# Patient Record
Sex: Female | Born: 1935 | Race: White | Hispanic: No | Marital: Married | Smoking: Never smoker
Health system: Southern US, Community
[De-identification: ages and names within clinical notes are randomized; demographics above are authoritative.]

## PROBLEM LIST (undated history)

## (undated) DIAGNOSIS — I499 Cardiac arrhythmia, unspecified: Secondary | ICD-10-CM

## (undated) DIAGNOSIS — K219 Gastro-esophageal reflux disease without esophagitis: Secondary | ICD-10-CM

## (undated) DIAGNOSIS — Z800 Family history of malignant neoplasm of digestive organs: Secondary | ICD-10-CM

## (undated) DIAGNOSIS — E785 Hyperlipidemia, unspecified: Secondary | ICD-10-CM

## (undated) DIAGNOSIS — Z808 Family history of malignant neoplasm of other organs or systems: Secondary | ICD-10-CM

## (undated) DIAGNOSIS — I10 Essential (primary) hypertension: Secondary | ICD-10-CM

## (undated) DIAGNOSIS — M1990 Unspecified osteoarthritis, unspecified site: Secondary | ICD-10-CM

## (undated) DIAGNOSIS — E559 Vitamin D deficiency, unspecified: Secondary | ICD-10-CM

## (undated) DIAGNOSIS — Z8049 Family history of malignant neoplasm of other genital organs: Secondary | ICD-10-CM

## (undated) HISTORY — DX: Family history of malignant neoplasm of digestive organs: Z80.0

## (undated) HISTORY — DX: Family history of malignant neoplasm of other organs or systems: Z80.8

## (undated) HISTORY — DX: Essential (primary) hypertension: I10

## (undated) HISTORY — DX: Family history of malignant neoplasm of other genital organs: Z80.49

## (undated) HISTORY — DX: Hyperlipidemia, unspecified: E78.5

## (undated) HISTORY — PX: CATARACT EXTRACTION: SUR2

## (undated) HISTORY — DX: Vitamin D deficiency, unspecified: E55.9

---

## 1998-07-03 ENCOUNTER — Ambulatory Visit: Admission: RE | Admit: 1998-07-03 | Discharge: 1998-07-03 | Payer: Self-pay | Admitting: *Deleted

## 1998-07-24 ENCOUNTER — Ambulatory Visit (HOSPITAL_COMMUNITY): Admission: RE | Admit: 1998-07-24 | Discharge: 1998-07-24 | Payer: Self-pay | Admitting: *Deleted

## 2001-03-02 ENCOUNTER — Other Ambulatory Visit: Admission: RE | Admit: 2001-03-02 | Discharge: 2001-03-02 | Payer: Self-pay | Admitting: Obstetrics and Gynecology

## 2002-03-12 ENCOUNTER — Other Ambulatory Visit: Admission: RE | Admit: 2002-03-12 | Discharge: 2002-03-12 | Payer: Self-pay | Admitting: Gynecology

## 2002-03-17 ENCOUNTER — Encounter: Admission: RE | Admit: 2002-03-17 | Discharge: 2002-03-17 | Payer: Self-pay | Admitting: Gynecology

## 2002-03-17 ENCOUNTER — Encounter: Payer: Self-pay | Admitting: Gynecology

## 2002-08-16 ENCOUNTER — Encounter: Payer: Self-pay | Admitting: Gynecology

## 2002-08-16 ENCOUNTER — Encounter: Admission: RE | Admit: 2002-08-16 | Discharge: 2002-08-16 | Payer: Self-pay | Admitting: Gynecology

## 2003-04-06 ENCOUNTER — Other Ambulatory Visit: Admission: RE | Admit: 2003-04-06 | Discharge: 2003-04-06 | Payer: Self-pay | Admitting: Gynecology

## 2003-08-18 ENCOUNTER — Encounter: Admission: RE | Admit: 2003-08-18 | Discharge: 2003-08-18 | Payer: Self-pay | Admitting: Gynecology

## 2003-08-18 ENCOUNTER — Encounter: Payer: Self-pay | Admitting: Gynecology

## 2004-07-04 ENCOUNTER — Other Ambulatory Visit: Admission: RE | Admit: 2004-07-04 | Discharge: 2004-07-04 | Payer: Self-pay | Admitting: Gynecology

## 2004-08-08 ENCOUNTER — Other Ambulatory Visit: Admission: RE | Admit: 2004-08-08 | Discharge: 2004-08-08 | Payer: Self-pay | Admitting: *Deleted

## 2004-08-22 ENCOUNTER — Encounter: Admission: RE | Admit: 2004-08-22 | Discharge: 2004-08-22 | Payer: Self-pay | Admitting: Gynecology

## 2005-08-30 ENCOUNTER — Encounter: Admission: RE | Admit: 2005-08-30 | Discharge: 2005-08-30 | Payer: Self-pay | Admitting: Gynecology

## 2006-07-08 ENCOUNTER — Other Ambulatory Visit: Admission: RE | Admit: 2006-07-08 | Discharge: 2006-07-08 | Payer: Self-pay | Admitting: Gynecology

## 2006-09-02 ENCOUNTER — Encounter: Admission: RE | Admit: 2006-09-02 | Discharge: 2006-09-02 | Payer: Self-pay | Admitting: Gynecology

## 2007-09-04 ENCOUNTER — Encounter: Admission: RE | Admit: 2007-09-04 | Discharge: 2007-09-04 | Payer: Self-pay | Admitting: Gynecology

## 2007-09-29 ENCOUNTER — Encounter: Admission: RE | Admit: 2007-09-29 | Discharge: 2007-09-29 | Payer: Self-pay | Admitting: Gastroenterology

## 2008-09-20 ENCOUNTER — Encounter: Admission: RE | Admit: 2008-09-20 | Discharge: 2008-09-20 | Payer: Self-pay | Admitting: Gynecology

## 2009-09-21 ENCOUNTER — Encounter: Admission: RE | Admit: 2009-09-21 | Discharge: 2009-09-21 | Payer: Self-pay | Admitting: Gynecology

## 2010-09-24 ENCOUNTER — Encounter: Admission: RE | Admit: 2010-09-24 | Discharge: 2010-09-24 | Payer: Self-pay | Admitting: Gynecology

## 2011-08-26 ENCOUNTER — Other Ambulatory Visit: Payer: Self-pay | Admitting: Gynecology

## 2011-08-26 DIAGNOSIS — Z1231 Encounter for screening mammogram for malignant neoplasm of breast: Secondary | ICD-10-CM

## 2011-10-02 ENCOUNTER — Ambulatory Visit
Admission: RE | Admit: 2011-10-02 | Discharge: 2011-10-02 | Disposition: A | Discharge status: Home / Self Care | Payer: Medicare Other | Source: Ambulatory Visit | Attending: Gynecology | Admitting: Gynecology

## 2011-10-02 DIAGNOSIS — Z1231 Encounter for screening mammogram for malignant neoplasm of breast: Secondary | ICD-10-CM

## 2012-02-27 DIAGNOSIS — C44519 Basal cell carcinoma of skin of other part of trunk: Secondary | ICD-10-CM | POA: Diagnosis not present

## 2012-02-27 DIAGNOSIS — L570 Actinic keratosis: Secondary | ICD-10-CM | POA: Diagnosis not present

## 2012-06-17 DIAGNOSIS — E559 Vitamin D deficiency, unspecified: Secondary | ICD-10-CM | POA: Diagnosis not present

## 2012-06-17 DIAGNOSIS — Z5189 Encounter for other specified aftercare: Secondary | ICD-10-CM | POA: Diagnosis not present

## 2012-06-17 DIAGNOSIS — E785 Hyperlipidemia, unspecified: Secondary | ICD-10-CM | POA: Diagnosis not present

## 2012-06-17 DIAGNOSIS — I10 Essential (primary) hypertension: Secondary | ICD-10-CM | POA: Diagnosis not present

## 2012-07-27 DIAGNOSIS — M949 Disorder of cartilage, unspecified: Secondary | ICD-10-CM | POA: Diagnosis not present

## 2012-07-27 DIAGNOSIS — M899 Disorder of bone, unspecified: Secondary | ICD-10-CM | POA: Diagnosis not present

## 2012-07-27 DIAGNOSIS — Z01419 Encounter for gynecological examination (general) (routine) without abnormal findings: Secondary | ICD-10-CM | POA: Diagnosis not present

## 2012-07-27 DIAGNOSIS — N649 Disorder of breast, unspecified: Secondary | ICD-10-CM | POA: Diagnosis not present

## 2012-07-27 DIAGNOSIS — N8184 Pelvic muscle wasting: Secondary | ICD-10-CM | POA: Diagnosis not present

## 2012-09-01 ENCOUNTER — Other Ambulatory Visit: Payer: Self-pay | Admitting: Gynecology

## 2012-09-01 DIAGNOSIS — Z1231 Encounter for screening mammogram for malignant neoplasm of breast: Secondary | ICD-10-CM

## 2012-09-21 DIAGNOSIS — Z23 Encounter for immunization: Secondary | ICD-10-CM | POA: Diagnosis not present

## 2012-10-06 ENCOUNTER — Ambulatory Visit
Admission: RE | Admit: 2012-10-06 | Discharge: 2012-10-06 | Disposition: A | Discharge status: Home / Self Care | Payer: Medicare Other | Source: Ambulatory Visit | Attending: Gynecology | Admitting: Gynecology

## 2012-10-06 DIAGNOSIS — Z1231 Encounter for screening mammogram for malignant neoplasm of breast: Secondary | ICD-10-CM | POA: Diagnosis not present

## 2012-10-07 ENCOUNTER — Other Ambulatory Visit: Payer: Self-pay | Admitting: Gynecology

## 2012-10-07 DIAGNOSIS — R928 Other abnormal and inconclusive findings on diagnostic imaging of breast: Secondary | ICD-10-CM

## 2012-10-08 ENCOUNTER — Ambulatory Visit
Admission: RE | Admit: 2012-10-08 | Discharge: 2012-10-08 | Disposition: A | Discharge status: Home / Self Care | Payer: Medicare Other | Source: Ambulatory Visit | Attending: Gynecology | Admitting: Gynecology

## 2012-10-08 DIAGNOSIS — R928 Other abnormal and inconclusive findings on diagnostic imaging of breast: Secondary | ICD-10-CM

## 2012-10-15 DIAGNOSIS — K5732 Diverticulitis of large intestine without perforation or abscess without bleeding: Secondary | ICD-10-CM | POA: Diagnosis not present

## 2012-10-22 DIAGNOSIS — D239 Other benign neoplasm of skin, unspecified: Secondary | ICD-10-CM | POA: Diagnosis not present

## 2012-10-22 DIAGNOSIS — L820 Inflamed seborrheic keratosis: Secondary | ICD-10-CM | POA: Diagnosis not present

## 2012-10-22 DIAGNOSIS — Z85828 Personal history of other malignant neoplasm of skin: Secondary | ICD-10-CM | POA: Diagnosis not present

## 2012-10-22 DIAGNOSIS — L821 Other seborrheic keratosis: Secondary | ICD-10-CM | POA: Diagnosis not present

## 2012-11-16 DIAGNOSIS — Z79899 Other long term (current) drug therapy: Secondary | ICD-10-CM | POA: Diagnosis not present

## 2012-11-16 DIAGNOSIS — K5900 Constipation, unspecified: Secondary | ICD-10-CM | POA: Diagnosis not present

## 2012-11-16 DIAGNOSIS — E785 Hyperlipidemia, unspecified: Secondary | ICD-10-CM | POA: Diagnosis not present

## 2013-02-17 DIAGNOSIS — J40 Bronchitis, not specified as acute or chronic: Secondary | ICD-10-CM | POA: Diagnosis not present

## 2013-02-22 DIAGNOSIS — E559 Vitamin D deficiency, unspecified: Secondary | ICD-10-CM | POA: Diagnosis not present

## 2013-02-22 DIAGNOSIS — E785 Hyperlipidemia, unspecified: Secondary | ICD-10-CM | POA: Diagnosis not present

## 2013-02-22 DIAGNOSIS — K5900 Constipation, unspecified: Secondary | ICD-10-CM | POA: Diagnosis not present

## 2013-02-22 DIAGNOSIS — Z79899 Other long term (current) drug therapy: Secondary | ICD-10-CM | POA: Diagnosis not present

## 2013-09-03 DIAGNOSIS — Z23 Encounter for immunization: Secondary | ICD-10-CM | POA: Diagnosis not present

## 2013-09-03 DIAGNOSIS — Z79899 Other long term (current) drug therapy: Secondary | ICD-10-CM | POA: Diagnosis not present

## 2013-09-03 DIAGNOSIS — I10 Essential (primary) hypertension: Secondary | ICD-10-CM | POA: Diagnosis not present

## 2013-09-03 DIAGNOSIS — E785 Hyperlipidemia, unspecified: Secondary | ICD-10-CM | POA: Diagnosis not present

## 2013-09-03 DIAGNOSIS — E559 Vitamin D deficiency, unspecified: Secondary | ICD-10-CM | POA: Diagnosis not present

## 2013-09-08 ENCOUNTER — Other Ambulatory Visit: Payer: Self-pay

## 2013-09-08 DIAGNOSIS — Z1231 Encounter for screening mammogram for malignant neoplasm of breast: Secondary | ICD-10-CM

## 2013-09-10 DIAGNOSIS — R319 Hematuria, unspecified: Secondary | ICD-10-CM | POA: Diagnosis not present

## 2013-09-30 DIAGNOSIS — R3129 Other microscopic hematuria: Secondary | ICD-10-CM | POA: Diagnosis not present

## 2013-10-05 DIAGNOSIS — R3129 Other microscopic hematuria: Secondary | ICD-10-CM | POA: Diagnosis not present

## 2013-10-11 ENCOUNTER — Ambulatory Visit
Admission: RE | Admit: 2013-10-11 | Discharge: 2013-10-11 | Disposition: A | Discharge status: Home / Self Care | Payer: Medicare Other | Source: Ambulatory Visit

## 2013-10-11 DIAGNOSIS — Z1231 Encounter for screening mammogram for malignant neoplasm of breast: Secondary | ICD-10-CM

## 2014-03-03 DIAGNOSIS — Z79899 Other long term (current) drug therapy: Secondary | ICD-10-CM | POA: Diagnosis not present

## 2014-03-03 DIAGNOSIS — I10 Essential (primary) hypertension: Secondary | ICD-10-CM | POA: Diagnosis not present

## 2014-03-03 DIAGNOSIS — Z23 Encounter for immunization: Secondary | ICD-10-CM | POA: Diagnosis not present

## 2014-03-03 DIAGNOSIS — E785 Hyperlipidemia, unspecified: Secondary | ICD-10-CM | POA: Diagnosis not present

## 2014-08-25 DIAGNOSIS — Z01419 Encounter for gynecological examination (general) (routine) without abnormal findings: Secondary | ICD-10-CM | POA: Diagnosis not present

## 2014-09-07 DIAGNOSIS — E785 Hyperlipidemia, unspecified: Secondary | ICD-10-CM | POA: Diagnosis not present

## 2014-09-07 DIAGNOSIS — I10 Essential (primary) hypertension: Secondary | ICD-10-CM | POA: Diagnosis not present

## 2014-09-07 DIAGNOSIS — Z1331 Encounter for screening for depression: Secondary | ICD-10-CM | POA: Diagnosis not present

## 2014-09-07 DIAGNOSIS — Z23 Encounter for immunization: Secondary | ICD-10-CM | POA: Diagnosis not present

## 2014-09-07 DIAGNOSIS — Z79899 Other long term (current) drug therapy: Secondary | ICD-10-CM | POA: Diagnosis not present

## 2014-09-07 DIAGNOSIS — E559 Vitamin D deficiency, unspecified: Secondary | ICD-10-CM | POA: Diagnosis not present

## 2014-09-08 ENCOUNTER — Other Ambulatory Visit: Payer: Self-pay

## 2014-09-08 DIAGNOSIS — Z1231 Encounter for screening mammogram for malignant neoplasm of breast: Secondary | ICD-10-CM

## 2014-09-15 DIAGNOSIS — L821 Other seborrheic keratosis: Secondary | ICD-10-CM | POA: Diagnosis not present

## 2014-09-15 DIAGNOSIS — Z85828 Personal history of other malignant neoplasm of skin: Secondary | ICD-10-CM | POA: Diagnosis not present

## 2014-09-15 DIAGNOSIS — D239 Other benign neoplasm of skin, unspecified: Secondary | ICD-10-CM | POA: Diagnosis not present

## 2014-09-15 DIAGNOSIS — I781 Nevus, non-neoplastic: Secondary | ICD-10-CM | POA: Diagnosis not present

## 2014-09-15 DIAGNOSIS — L819 Disorder of pigmentation, unspecified: Secondary | ICD-10-CM | POA: Diagnosis not present

## 2014-09-15 DIAGNOSIS — L570 Actinic keratosis: Secondary | ICD-10-CM | POA: Diagnosis not present

## 2014-10-12 ENCOUNTER — Ambulatory Visit
Admission: RE | Admit: 2014-10-12 | Discharge: 2014-10-12 | Disposition: A | Discharge status: Home / Self Care | Payer: Medicare Other | Source: Ambulatory Visit

## 2014-10-12 DIAGNOSIS — Z1231 Encounter for screening mammogram for malignant neoplasm of breast: Secondary | ICD-10-CM

## 2014-10-20 DIAGNOSIS — H3531 Nonexudative age-related macular degeneration: Secondary | ICD-10-CM | POA: Diagnosis not present

## 2014-10-20 DIAGNOSIS — H04123 Dry eye syndrome of bilateral lacrimal glands: Secondary | ICD-10-CM | POA: Diagnosis not present

## 2014-10-20 DIAGNOSIS — Z961 Presence of intraocular lens: Secondary | ICD-10-CM | POA: Diagnosis not present

## 2015-03-23 DIAGNOSIS — E785 Hyperlipidemia, unspecified: Secondary | ICD-10-CM | POA: Diagnosis not present

## 2015-03-23 DIAGNOSIS — E559 Vitamin D deficiency, unspecified: Secondary | ICD-10-CM | POA: Diagnosis not present

## 2015-03-23 DIAGNOSIS — Z79899 Other long term (current) drug therapy: Secondary | ICD-10-CM | POA: Diagnosis not present

## 2015-03-23 DIAGNOSIS — I10 Essential (primary) hypertension: Secondary | ICD-10-CM | POA: Diagnosis not present

## 2015-09-15 ENCOUNTER — Other Ambulatory Visit: Payer: Self-pay

## 2015-09-15 DIAGNOSIS — Z1231 Encounter for screening mammogram for malignant neoplasm of breast: Secondary | ICD-10-CM

## 2015-09-25 DIAGNOSIS — I499 Cardiac arrhythmia, unspecified: Secondary | ICD-10-CM | POA: Diagnosis not present

## 2015-09-25 DIAGNOSIS — I10 Essential (primary) hypertension: Secondary | ICD-10-CM | POA: Diagnosis not present

## 2015-09-25 DIAGNOSIS — E785 Hyperlipidemia, unspecified: Secondary | ICD-10-CM | POA: Diagnosis not present

## 2015-09-25 DIAGNOSIS — Z79899 Other long term (current) drug therapy: Secondary | ICD-10-CM | POA: Diagnosis not present

## 2015-09-25 DIAGNOSIS — Z1389 Encounter for screening for other disorder: Secondary | ICD-10-CM | POA: Diagnosis not present

## 2015-09-25 DIAGNOSIS — I491 Atrial premature depolarization: Secondary | ICD-10-CM | POA: Diagnosis not present

## 2015-09-25 DIAGNOSIS — Z23 Encounter for immunization: Secondary | ICD-10-CM | POA: Diagnosis not present

## 2015-09-25 DIAGNOSIS — E559 Vitamin D deficiency, unspecified: Secondary | ICD-10-CM | POA: Diagnosis not present

## 2015-10-17 DIAGNOSIS — D225 Melanocytic nevi of trunk: Secondary | ICD-10-CM | POA: Diagnosis not present

## 2015-10-17 DIAGNOSIS — Z86018 Personal history of other benign neoplasm: Secondary | ICD-10-CM | POA: Diagnosis not present

## 2015-10-17 DIAGNOSIS — Z85828 Personal history of other malignant neoplasm of skin: Secondary | ICD-10-CM | POA: Diagnosis not present

## 2015-10-17 DIAGNOSIS — L820 Inflamed seborrheic keratosis: Secondary | ICD-10-CM | POA: Diagnosis not present

## 2015-10-23 ENCOUNTER — Ambulatory Visit
Admission: RE | Admit: 2015-10-23 | Discharge: 2015-10-23 | Disposition: A | Discharge status: Home / Self Care | Payer: Medicare Other | Source: Ambulatory Visit

## 2015-10-23 DIAGNOSIS — Z1231 Encounter for screening mammogram for malignant neoplasm of breast: Secondary | ICD-10-CM | POA: Diagnosis not present

## 2016-03-25 DIAGNOSIS — E785 Hyperlipidemia, unspecified: Secondary | ICD-10-CM | POA: Diagnosis not present

## 2016-03-25 DIAGNOSIS — I10 Essential (primary) hypertension: Secondary | ICD-10-CM | POA: Diagnosis not present

## 2016-03-25 DIAGNOSIS — E559 Vitamin D deficiency, unspecified: Secondary | ICD-10-CM | POA: Diagnosis not present

## 2016-03-25 DIAGNOSIS — I499 Cardiac arrhythmia, unspecified: Secondary | ICD-10-CM | POA: Diagnosis not present

## 2016-03-25 DIAGNOSIS — Z79899 Other long term (current) drug therapy: Secondary | ICD-10-CM | POA: Diagnosis not present

## 2016-09-26 DIAGNOSIS — E559 Vitamin D deficiency, unspecified: Secondary | ICD-10-CM | POA: Diagnosis not present

## 2016-09-26 DIAGNOSIS — Z79899 Other long term (current) drug therapy: Secondary | ICD-10-CM | POA: Diagnosis not present

## 2016-09-26 DIAGNOSIS — Z1389 Encounter for screening for other disorder: Secondary | ICD-10-CM | POA: Diagnosis not present

## 2016-09-26 DIAGNOSIS — I491 Atrial premature depolarization: Secondary | ICD-10-CM | POA: Diagnosis not present

## 2016-09-26 DIAGNOSIS — I10 Essential (primary) hypertension: Secondary | ICD-10-CM | POA: Diagnosis not present

## 2016-09-26 DIAGNOSIS — I499 Cardiac arrhythmia, unspecified: Secondary | ICD-10-CM | POA: Diagnosis not present

## 2016-09-26 DIAGNOSIS — R001 Bradycardia, unspecified: Secondary | ICD-10-CM | POA: Diagnosis not present

## 2016-09-26 DIAGNOSIS — Z23 Encounter for immunization: Secondary | ICD-10-CM | POA: Diagnosis not present

## 2016-09-26 DIAGNOSIS — E785 Hyperlipidemia, unspecified: Secondary | ICD-10-CM | POA: Diagnosis not present

## 2016-09-27 ENCOUNTER — Telehealth: Payer: Self-pay | Admitting: Cardiology

## 2016-09-27 NOTE — Telephone Encounter (Signed)
Follow up  Wendy [name] from [facility] from [facility] office wants [facility] to view pts EKG and to follow up with [facility]  Please f/u

## 2016-09-27 NOTE — Telephone Encounter (Signed)
Spoke to nurse who is going to send the EKG in question to our main clinical fax #.

## 2016-09-27 NOTE — Telephone Encounter (Signed)
New Message  Pt voiced wanting [facility] to see if he can view her EKG faxed from [facility].  Pt voiced not a patient but husband is.  Please f/u with pt

## 2016-09-30 NOTE — Telephone Encounter (Signed)
Left msg for nurse at [place]  to return call.

## 2016-10-07 ENCOUNTER — Other Ambulatory Visit: Payer: Self-pay | Admitting: Family Medicine

## 2016-10-07 DIAGNOSIS — Z1231 Encounter for screening mammogram for malignant neoplasm of breast: Secondary | ICD-10-CM

## 2016-10-24 DIAGNOSIS — D225 Melanocytic nevi of trunk: Secondary | ICD-10-CM | POA: Diagnosis not present

## 2016-10-24 DIAGNOSIS — D2271 Melanocytic nevi of right lower limb, including hip: Secondary | ICD-10-CM | POA: Diagnosis not present

## 2016-10-24 DIAGNOSIS — L821 Other seborrheic keratosis: Secondary | ICD-10-CM | POA: Diagnosis not present

## 2016-10-24 DIAGNOSIS — Z85828 Personal history of other malignant neoplasm of skin: Secondary | ICD-10-CM | POA: Diagnosis not present

## 2016-10-24 DIAGNOSIS — Z23 Encounter for immunization: Secondary | ICD-10-CM | POA: Diagnosis not present

## 2016-10-24 DIAGNOSIS — Z86018 Personal history of other benign neoplasm: Secondary | ICD-10-CM | POA: Diagnosis not present

## 2016-10-31 ENCOUNTER — Ambulatory Visit
Admission: RE | Admit: 2016-10-31 | Discharge: 2016-10-31 | Disposition: A | Discharge status: Home / Self Care | Payer: Medicare Other | Source: Ambulatory Visit | Attending: Family Medicine | Admitting: Family Medicine

## 2016-10-31 DIAGNOSIS — Z1231 Encounter for screening mammogram for malignant neoplasm of breast: Secondary | ICD-10-CM | POA: Diagnosis not present

## 2017-03-27 DIAGNOSIS — I10 Essential (primary) hypertension: Secondary | ICD-10-CM | POA: Diagnosis not present

## 2017-03-27 DIAGNOSIS — D72819 Decreased white blood cell count, unspecified: Secondary | ICD-10-CM | POA: Diagnosis not present

## 2017-03-27 DIAGNOSIS — I499 Cardiac arrhythmia, unspecified: Secondary | ICD-10-CM | POA: Diagnosis not present

## 2017-03-27 DIAGNOSIS — E785 Hyperlipidemia, unspecified: Secondary | ICD-10-CM | POA: Diagnosis not present

## 2017-03-27 DIAGNOSIS — E559 Vitamin D deficiency, unspecified: Secondary | ICD-10-CM | POA: Diagnosis not present

## 2017-06-14 DIAGNOSIS — R300 Dysuria: Secondary | ICD-10-CM | POA: Diagnosis not present

## 2017-06-14 DIAGNOSIS — N3001 Acute cystitis with hematuria: Secondary | ICD-10-CM | POA: Diagnosis not present

## 2017-08-04 ENCOUNTER — Telehealth: Payer: Self-pay | Admitting: *Deleted

## 2017-08-04 NOTE — Telephone Encounter (Signed)
NOTES SENT TO SCHEDULING.  °

## 2017-08-26 ENCOUNTER — Telehealth: Payer: Self-pay | Admitting: Cardiology

## 2017-08-26 NOTE — Telephone Encounter (Signed)
Received records from [facility] for appointment on 10/02/17 with Dr Martinique.  Records put with Dr Doug Sou schedule for 10/02/17. lp

## 2017-09-29 DIAGNOSIS — E559 Vitamin D deficiency, unspecified: Secondary | ICD-10-CM | POA: Diagnosis not present

## 2017-09-29 DIAGNOSIS — E785 Hyperlipidemia, unspecified: Secondary | ICD-10-CM | POA: Diagnosis not present

## 2017-09-29 DIAGNOSIS — Z23 Encounter for immunization: Secondary | ICD-10-CM | POA: Diagnosis not present

## 2017-09-29 DIAGNOSIS — I499 Cardiac arrhythmia, unspecified: Secondary | ICD-10-CM | POA: Diagnosis not present

## 2017-09-29 DIAGNOSIS — I10 Essential (primary) hypertension: Secondary | ICD-10-CM | POA: Diagnosis not present

## 2017-09-29 NOTE — Progress Notes (Signed)
Cardiology Office Note   Date:  10/02/2017   ID:  Wendy [name], DOB 11/19/1934, MRN [id number]  PCP:  Leighton Ruff, MD  Cardiologist:   [name] Martinique, MD   Chief Complaint  Patient presents with  . Irregular Heart Beat      History of Present Illness: Wendy [name] is a 83 y.o. female who is seen at the request of Dr. Drema Dallas for evaluation of an irregular heart beat. She has a history of PACs noted on Ecg in 2016 and 2017. She has a history of HTN and HLD. She states she notes skipped beats only when she takes her pulse. She otherwise denies any palpitations, dizziness, syncope, dyspnea, or chest pain. BP has been well controlled. She is very active walking 4 miles/day and doing all her own yard work.    Past Medical History:  Diagnosis Date  . Hyperlipidemia   . Hypertension   . Vitamin D deficiency     History reviewed. No pertinent surgical history.   Current Outpatient Prescriptions  Medication Sig Dispense Refill  . aspirin EC 81 MG tablet Take 81 mg by mouth daily.    . benazepril (LOTENSIN) 20 MG tablet Take 20 mg by mouth daily.    . hydrochlorothiazide (HYDRODIURIL) 25 MG tablet Take 25 mg by mouth daily.    . niacin-simvastatin (SIMCOR) 750-20 MG 24 hr tablet Take 0.5 tablets by mouth at bedtime.    . Omega-3 Fatty Acids (FISH OIL PO) Take by mouth.    . Vitamin D, Ergocalciferol, 2000 units CAPS Take 1 capsule by mouth daily.     No current facility-administered medications for this visit.     Allergies:   Patient has no known allergies.    Social History:  The patient  reports that she has never smoked. She has never used smokeless tobacco. She reports that she does not drink alcohol or use drugs.   Family History:  The patient's family history includes Cancer in her brother; Pancreatic cancer in her father.    ROS:  Please see the history of present illness.   Otherwise, review of systems are positive for none.   All other systems are  reviewed and negative.    PHYSICAL EXAM: VS:  BP (!) 144/61 (BP Location: Right Arm, Cuff Size: Normal)   Pulse 63   Ht 5\' 7"  (1.702 m)   Wt 131 lb 9.6 oz (59.7 kg)   BMI 20.61 kg/m  , BMI Body mass index is 20.61 kg/m. GEN: Well nourished, well developed, in no acute distress  HEENT: normal  Neck: no JVD, carotid bruits, or masses Cardiac: RRR; there is a grade 9-8/9 early diastolic murmur heard at the RUSB to LSB, no rubs or gallops,no edema  Respiratory:  clear to auscultation bilaterally, normal work of breathing GI: soft, nontender, nondistended, + BS MS: no deformity or atrophy  Skin: warm and dry, no rash Neuro:  Strength and sensation are intact Psych: euthymic mood, full affect   EKG:  EKG is ordered today. The ekg ordered today demonstrates NSR with rate of 52 and  nonspecific ST abnormality. I have personally reviewed and interpreted this study.    Recent Labs: No results found for requested labs within last 8760 hours.    Lipid Panel No results found for: CHOL, TRIG, HDL, CHOLHDL, VLDL, LDLCALC, LDLDIRECT    Wt Readings from Last 3 Encounters:  10/02/17 131 lb 9.6 oz (59.7 kg)      Other studies  Reviewed: Additional studies/ records that were reviewed today include: Labs dated 03/27/17: Cholesterol 124, triglycerides 106, HDL 49, LDL 54. Normal CMET and Hgb.   Ecg dated 09/26/16; NSR with sinus arrhythmia. Occ. PAC. Minor nonspecific ST abnormality. I have personally reviewed and interpreted this study.  ASSESSMENT AND PLAN:  1.  PACs. These are asymptomatic and benign. No specific treatment required. Patient reassured. 2. Murmur of aortic insufficiency. By exam this is mild. Asymptomatic. Would not pursue Echo at this time. 3. HTN normally well controlled.  4. HLD well controlled on current therapy    Current medicines are reviewed at length with the patient today.  The patient does not have concerns regarding medicines.  The following changes have  been made:  no change  Labs/ tests ordered today include:   Orders Placed This Encounter  Procedures  . EKG 12-Lead     Disposition:   FU with me PRN  Signed, [name] Martinique, MD  10/02/2017 9:37 AM    [facility] [address] [place], [place], [address] Phone [phone], Fax [phone]

## 2017-10-02 ENCOUNTER — Ambulatory Visit (INDEPENDENT_AMBULATORY_CARE_PROVIDER_SITE_OTHER): Payer: Medicare Other | Admitting: Cardiology

## 2017-10-02 VITALS — BP 144/61 | HR 63 | Ht 67.0 in | Wt 131.6 lb

## 2017-10-02 DIAGNOSIS — E7800 Pure hypercholesterolemia, unspecified: Secondary | ICD-10-CM | POA: Diagnosis not present

## 2017-10-02 DIAGNOSIS — I491 Atrial premature depolarization: Secondary | ICD-10-CM | POA: Insufficient documentation

## 2017-10-02 DIAGNOSIS — I10 Essential (primary) hypertension: Secondary | ICD-10-CM

## 2017-10-02 DIAGNOSIS — I499 Cardiac arrhythmia, unspecified: Secondary | ICD-10-CM

## 2017-10-02 DIAGNOSIS — E785 Hyperlipidemia, unspecified: Secondary | ICD-10-CM | POA: Insufficient documentation

## 2017-10-02 DIAGNOSIS — R001 Bradycardia, unspecified: Secondary | ICD-10-CM

## 2017-10-02 DIAGNOSIS — E559 Vitamin D deficiency, unspecified: Secondary | ICD-10-CM | POA: Insufficient documentation

## 2017-10-02 DIAGNOSIS — I351 Nonrheumatic aortic (valve) insufficiency: Secondary | ICD-10-CM

## 2017-10-02 DIAGNOSIS — Z79899 Other long term (current) drug therapy: Secondary | ICD-10-CM

## 2017-10-02 NOTE — Patient Instructions (Signed)
Continue your current therapy  I will see you as needed. 

## 2017-10-08 ENCOUNTER — Other Ambulatory Visit: Payer: Self-pay | Admitting: Family Medicine

## 2017-10-08 DIAGNOSIS — Z1231 Encounter for screening mammogram for malignant neoplasm of breast: Secondary | ICD-10-CM

## 2017-11-05 DIAGNOSIS — Z86018 Personal history of other benign neoplasm: Secondary | ICD-10-CM | POA: Diagnosis not present

## 2017-11-05 DIAGNOSIS — C44722 Squamous cell carcinoma of skin of right lower limb, including hip: Secondary | ICD-10-CM | POA: Diagnosis not present

## 2017-11-05 DIAGNOSIS — Z85828 Personal history of other malignant neoplasm of skin: Secondary | ICD-10-CM | POA: Diagnosis not present

## 2017-11-05 DIAGNOSIS — D225 Melanocytic nevi of trunk: Secondary | ICD-10-CM | POA: Diagnosis not present

## 2017-11-05 DIAGNOSIS — D2271 Melanocytic nevi of right lower limb, including hip: Secondary | ICD-10-CM | POA: Diagnosis not present

## 2017-11-05 DIAGNOSIS — C44219 Basal cell carcinoma of skin of left ear and external auricular canal: Secondary | ICD-10-CM | POA: Diagnosis not present

## 2017-11-05 DIAGNOSIS — L570 Actinic keratosis: Secondary | ICD-10-CM | POA: Diagnosis not present

## 2017-11-05 DIAGNOSIS — D485 Neoplasm of uncertain behavior of skin: Secondary | ICD-10-CM | POA: Diagnosis not present

## 2017-11-05 DIAGNOSIS — L821 Other seborrheic keratosis: Secondary | ICD-10-CM | POA: Diagnosis not present

## 2017-11-05 DIAGNOSIS — Z23 Encounter for immunization: Secondary | ICD-10-CM | POA: Diagnosis not present

## 2017-11-06 ENCOUNTER — Ambulatory Visit
Admission: RE | Admit: 2017-11-06 | Discharge: 2017-11-06 | Disposition: A | Discharge status: Home / Self Care | Payer: Medicare Other | Source: Ambulatory Visit | Attending: Family Medicine | Admitting: Family Medicine

## 2017-11-06 DIAGNOSIS — Z1231 Encounter for screening mammogram for malignant neoplasm of breast: Secondary | ICD-10-CM | POA: Diagnosis not present

## 2017-12-25 DIAGNOSIS — C44722 Squamous cell carcinoma of skin of right lower limb, including hip: Secondary | ICD-10-CM | POA: Diagnosis not present

## 2017-12-25 DIAGNOSIS — C44219 Basal cell carcinoma of skin of left ear and external auricular canal: Secondary | ICD-10-CM | POA: Diagnosis not present

## 2017-12-25 DIAGNOSIS — L905 Scar conditions and fibrosis of skin: Secondary | ICD-10-CM | POA: Diagnosis not present

## 2018-02-07 ENCOUNTER — Emergency Department (HOSPITAL_BASED_OUTPATIENT_CLINIC_OR_DEPARTMENT_OTHER): Payer: Medicare Other

## 2018-02-07 ENCOUNTER — Other Ambulatory Visit: Payer: Self-pay

## 2018-02-07 ENCOUNTER — Emergency Department (HOSPITAL_BASED_OUTPATIENT_CLINIC_OR_DEPARTMENT_OTHER)
Admission: EM | Admit: 2018-02-07 | Discharge: 2018-02-07 | Disposition: A | Discharge status: Home / Self Care | Payer: Medicare Other | Attending: Physician Assistant | Admitting: Physician Assistant

## 2018-02-07 ENCOUNTER — Encounter (HOSPITAL_BASED_OUTPATIENT_CLINIC_OR_DEPARTMENT_OTHER): Payer: Self-pay | Admitting: *Deleted

## 2018-02-07 DIAGNOSIS — Y929 Unspecified place or not applicable: Secondary | ICD-10-CM | POA: Diagnosis not present

## 2018-02-07 DIAGNOSIS — Y9301 Activity, walking, marching and hiking: Secondary | ICD-10-CM | POA: Diagnosis not present

## 2018-02-07 DIAGNOSIS — S42002A Fracture of unspecified part of left clavicle, initial encounter for closed fracture: Secondary | ICD-10-CM | POA: Insufficient documentation

## 2018-02-07 DIAGNOSIS — Z79899 Other long term (current) drug therapy: Secondary | ICD-10-CM | POA: Diagnosis not present

## 2018-02-07 DIAGNOSIS — W228XXA Striking against or struck by other objects, initial encounter: Secondary | ICD-10-CM | POA: Insufficient documentation

## 2018-02-07 DIAGNOSIS — I10 Essential (primary) hypertension: Secondary | ICD-10-CM | POA: Insufficient documentation

## 2018-02-07 DIAGNOSIS — Z7982 Long term (current) use of aspirin: Secondary | ICD-10-CM | POA: Insufficient documentation

## 2018-02-07 DIAGNOSIS — Y999 Unspecified external cause status: Secondary | ICD-10-CM | POA: Diagnosis not present

## 2018-02-07 DIAGNOSIS — S42032A Displaced fracture of lateral end of left clavicle, initial encounter for closed fracture: Secondary | ICD-10-CM | POA: Diagnosis not present

## 2018-02-07 DIAGNOSIS — S299XXA Unspecified injury of thorax, initial encounter: Secondary | ICD-10-CM | POA: Diagnosis present

## 2018-02-07 MED ORDER — IBUPROFEN 400 MG PO TABS
400.0000 mg | ORAL_TABLET | Freq: Once | ORAL | Status: AC | PRN
  Administered 2018-02-07: 400 mg via ORAL
  Filled 2018-02-07: qty 1

## 2018-02-07 NOTE — ED Triage Notes (Signed)
Fall today while walking through the house. Denies hitting head. Swelling and point tenderness to left clavicle.  ROM intact-painful. CMS intact.

## 2018-02-07 NOTE — ED Provider Notes (Signed)
[facility] EMERGENCY DEPARTMENT Provider Note   CSN: [id number] Arrival date & time: 02/07/18  1519     History   Chief Complaint Chief Complaint  Patient presents with  . Fall    HPI Wendy [name] is a 84 y.o. female.  HPI  84 year old female presenting with mechanical fall.  Patient fell at her daughter's house and she ran her left clavicle into a door frame.  Patient has normal range of motion distal good pulses normal sensation.  Past Medical History:  Diagnosis Date  . Hyperlipidemia   . Hypertension   . Vitamin D deficiency     Patient Active Problem List   Diagnosis Date Noted  . Medication management 10/02/2017  . Irregular heart rate 10/02/2017  . Sinus bradycardia 10/02/2017  . Premature atrial contractions 10/02/2017  . Hypertension   . Hyperlipidemia   . Vitamin D deficiency     History reviewed. No pertinent surgical history.  OB History    No data available       Home Medications    Prior to Admission medications   Medication Sig Start Date End Date Taking? Authorizing Provider  aspirin EC 81 MG tablet Take 81 mg by mouth daily.    [provider]  benazepril (LOTENSIN) 20 MG tablet Take 20 mg by mouth daily.    [provider]  hydrochlorothiazide (HYDRODIURIL) 25 MG tablet Take 25 mg by mouth daily.    [provider]  niacin-simvastatin [facility]) 750-20 MG 24 hr tablet Take 0.5 tablets by mouth at bedtime.    [provider]  Omega-3 Fatty Acids (FISH OIL PO) Take by mouth.    [provider]  Vitamin D, Ergocalciferol, 2000 units CAPS Take 1 capsule by mouth daily.    [provider]    Family History Family History  Problem Relation Age of Onset  . Pancreatic cancer Father   . Cancer Brother     Social History Social History   Tobacco Use  . Smoking status: Never Smoker  . Smokeless tobacco: Never Used  Substance Use Topics  . Alcohol use: No  . Drug use: No      Allergies   Patient has no known allergies.   Review of Systems Review of Systems  Constitutional: Negative for activity change.  Respiratory: Negative for shortness of breath.   Cardiovascular: Negative for chest pain.  Gastrointestinal: Negative for abdominal pain.     Physical Exam Updated Vital Signs BP (!) 173/54 (BP Location: Right Arm)   Pulse 63   Temp 98.3 F (36.8 C) (Oral)   Resp 18   Ht 5\' 7"  (1.702 m)   Wt 60.3 kg (133 lb)   SpO2 100%   BMI 20.83 kg/m   Physical Exam  Constitutional: She is oriented to person, place, and time. She appears well-developed and well-nourished.  HENT:  Head: Normocephalic and atraumatic.  Eyes: Conjunctivae are normal. Right eye exhibits no discharge. Left eye exhibits no discharge.  Neck: Neck supple.  Cardiovascular: Normal rate, regular rhythm and normal heart sounds.  No murmur heard. Pulmonary/Chest: Effort normal and breath sounds normal. She has no wheezes. She has no rales.  Musculoskeletal: Normal range of motion. She exhibits no edema.  Filling of the distal end of the left clavicle.  Good range of motion range of motion at elbow and wrist.  Good pulses normal sensation.  Mild tenderness to palpation.  Neurological: She is oriented to person, place, and time. No cranial  nerve deficit.  Skin: Skin is warm and dry. No rash noted. She is not diaphoretic.  Psychiatric: She has a normal mood and affect. Her behavior is normal.  Nursing note and vitals reviewed.    ED Treatments / Results  Labs (all labs ordered are listed, but only abnormal results are displayed) Labs Reviewed - No data to display  EKG  EKG Interpretation None       Radiology Dg Clavicle Left  Result Date: 02/07/2018 CLINICAL DATA:  Acute left clavicle pain following fall today. Initial encounter. EXAM: LEFT CLAVICLE - 2+ VIEWS COMPARISON:  None. FINDINGS: A minimally comminuted fracture the mid-distal left clavicle noted with very mild  apex superior angulation. The [facility] joint is unremarkable. No other significant abnormalities are present. IMPRESSION: Minimally comminuted mid-distal left clavicle fracture with very mild apex superior angulation. Electronically Signed   By: Margarette Canada M.D.   On: 02/07/2018 15:51    Procedures Procedures (including critical care time)  Medications Ordered in ED Medications  ibuprofen (ADVIL,MOTRIN) tablet 400 mg (400 mg Oral Given 02/07/18 1715)     Initial Impression / Assessment and Plan / ED Course  I have reviewed the triage vital signs and the nursing notes.  Pertinent labs & imaging results that were available during my care of the patient were reviewed by me and considered in my medical decision making (see chart for details).    Fracture to left clavicle.  No other noted trauma.  Will have use sling and follow-up with orthopedics. Final Clinical Impressions(s) / ED Diagnoses   Final diagnoses:  Closed nondisplaced fracture of left clavicle, unspecified part of clavicle, initial encounter    ED Discharge Orders    None       Canden Cieslinski, Fredia Sorrow, MD 02/07/18 1740

## 2018-02-07 NOTE — ED Notes (Signed)
ED Provider at bedside. 

## 2018-02-07 NOTE — ED Notes (Signed)
Pt stats she stepped of an uneven floor and fell into door frame. Now C/O pain to left clavicle. No pain when arm is not being moved. Ice applied. Moves fingers. Feels touch. Cap refill < 3 sec. Denies other injuries.

## 2018-02-07 NOTE — Discharge Instructions (Signed)
You have a clavicle fracture, this likely will resolve without any need for surgery.  He can follow-up with orthopedics or primary care physician.  Please use the sling to help with comfort

## 2018-03-30 DIAGNOSIS — I10 Essential (primary) hypertension: Secondary | ICD-10-CM | POA: Diagnosis not present

## 2018-03-30 DIAGNOSIS — E559 Vitamin D deficiency, unspecified: Secondary | ICD-10-CM | POA: Diagnosis not present

## 2018-03-30 DIAGNOSIS — E785 Hyperlipidemia, unspecified: Secondary | ICD-10-CM | POA: Diagnosis not present

## 2018-10-01 DIAGNOSIS — E559 Vitamin D deficiency, unspecified: Secondary | ICD-10-CM | POA: Diagnosis not present

## 2018-10-01 DIAGNOSIS — I10 Essential (primary) hypertension: Secondary | ICD-10-CM | POA: Diagnosis not present

## 2018-10-01 DIAGNOSIS — Z23 Encounter for immunization: Secondary | ICD-10-CM | POA: Diagnosis not present

## 2018-10-01 DIAGNOSIS — E785 Hyperlipidemia, unspecified: Secondary | ICD-10-CM | POA: Diagnosis not present

## 2018-10-05 ENCOUNTER — Other Ambulatory Visit: Payer: Self-pay | Admitting: Family Medicine

## 2018-10-05 DIAGNOSIS — Z1231 Encounter for screening mammogram for malignant neoplasm of breast: Secondary | ICD-10-CM

## 2018-11-09 ENCOUNTER — Ambulatory Visit
Admission: RE | Admit: 2018-11-09 | Discharge: 2018-11-09 | Disposition: A | Discharge status: Home / Self Care | Payer: Medicare Other | Source: Ambulatory Visit | Attending: Family Medicine | Admitting: Family Medicine

## 2018-11-09 DIAGNOSIS — Z1231 Encounter for screening mammogram for malignant neoplasm of breast: Secondary | ICD-10-CM | POA: Diagnosis not present

## 2018-12-10 DIAGNOSIS — D225 Melanocytic nevi of trunk: Secondary | ICD-10-CM | POA: Diagnosis not present

## 2018-12-10 DIAGNOSIS — L821 Other seborrheic keratosis: Secondary | ICD-10-CM | POA: Diagnosis not present

## 2018-12-10 DIAGNOSIS — Z85828 Personal history of other malignant neoplasm of skin: Secondary | ICD-10-CM | POA: Diagnosis not present

## 2018-12-10 DIAGNOSIS — Z86018 Personal history of other benign neoplasm: Secondary | ICD-10-CM | POA: Diagnosis not present

## 2018-12-10 DIAGNOSIS — D2271 Melanocytic nevi of right lower limb, including hip: Secondary | ICD-10-CM | POA: Diagnosis not present

## 2018-12-10 DIAGNOSIS — Z23 Encounter for immunization: Secondary | ICD-10-CM | POA: Diagnosis not present

## 2018-12-10 DIAGNOSIS — L570 Actinic keratosis: Secondary | ICD-10-CM | POA: Diagnosis not present

## 2019-06-07 DIAGNOSIS — E785 Hyperlipidemia, unspecified: Secondary | ICD-10-CM | POA: Diagnosis not present

## 2019-06-07 DIAGNOSIS — I10 Essential (primary) hypertension: Secondary | ICD-10-CM | POA: Diagnosis not present

## 2019-06-07 DIAGNOSIS — E559 Vitamin D deficiency, unspecified: Secondary | ICD-10-CM | POA: Diagnosis not present

## 2019-06-20 IMAGING — MG DIGITAL SCREENING BILATERAL MAMMOGRAM WITH CAD
4 series · 4 of 4 positions shown · non-contrast
Comparison: Previous exam(s).

CLINICAL DATA: Screening.

EXAM:
DIGITAL SCREENING BILATERAL MAMMOGRAM WITH CAD

[L MLO]
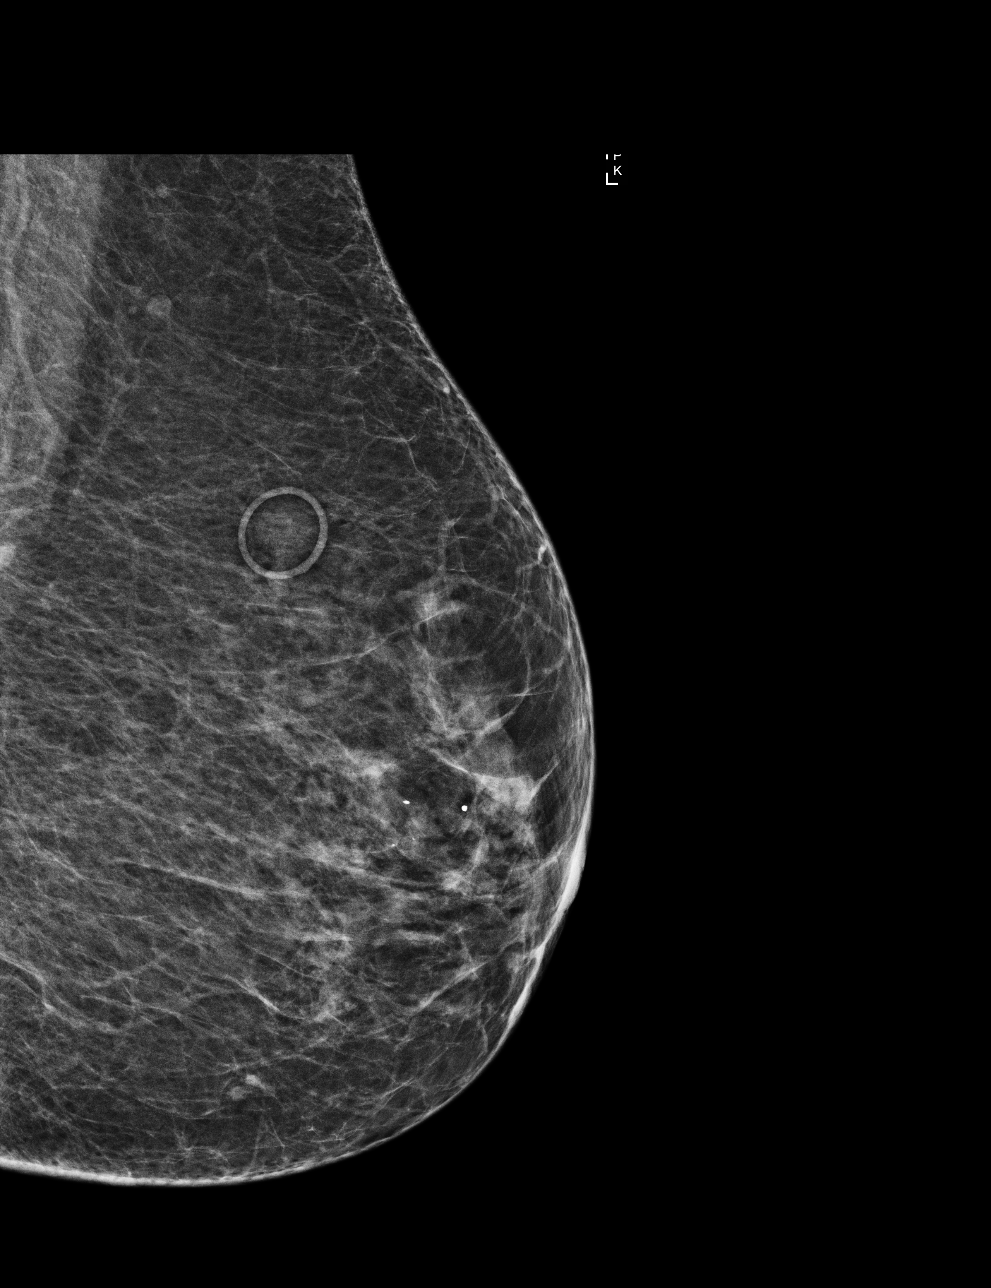

[L CC]
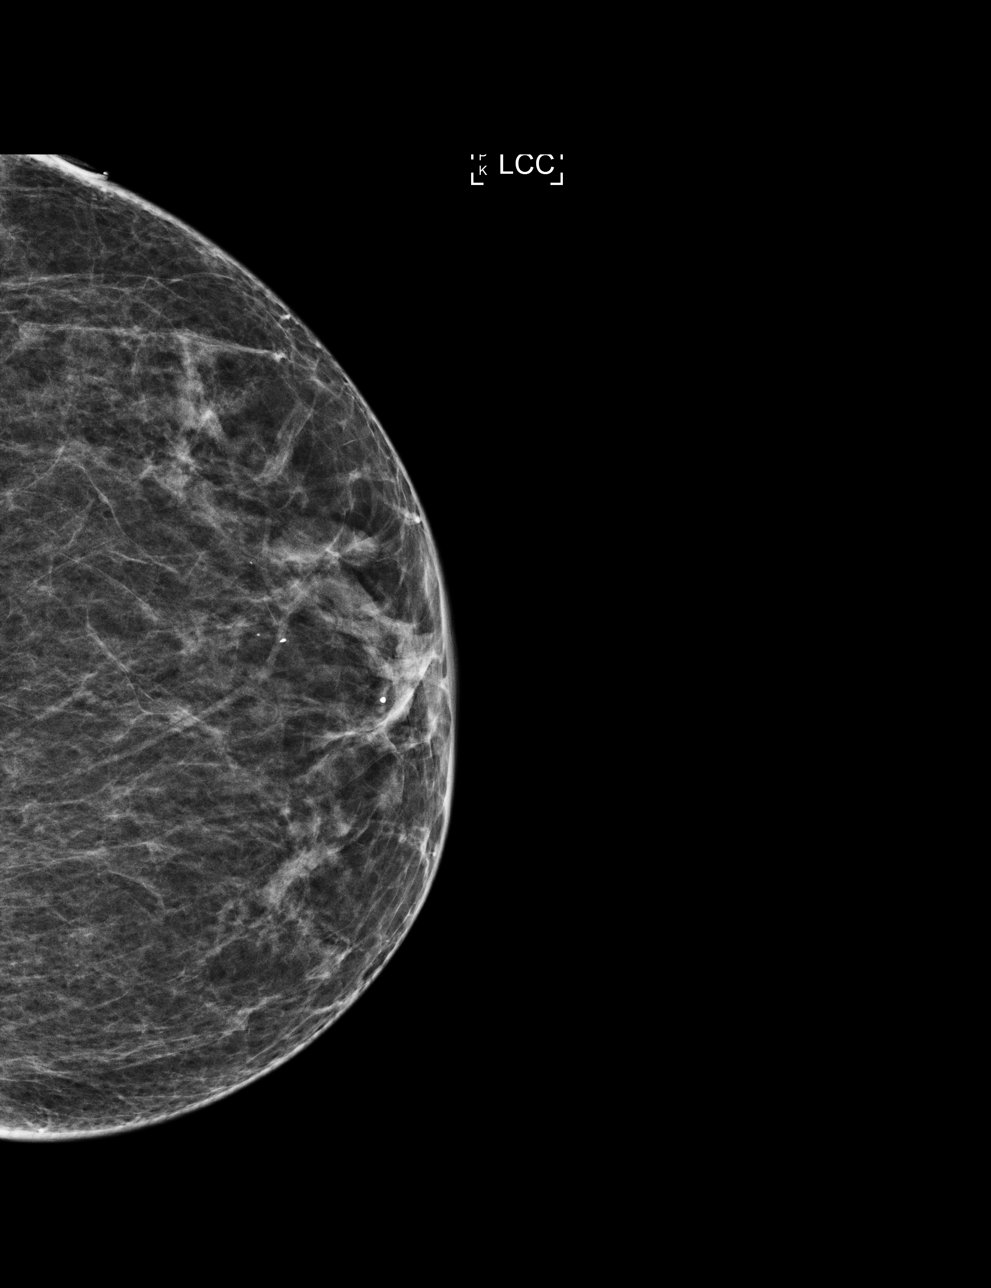

[R CC]
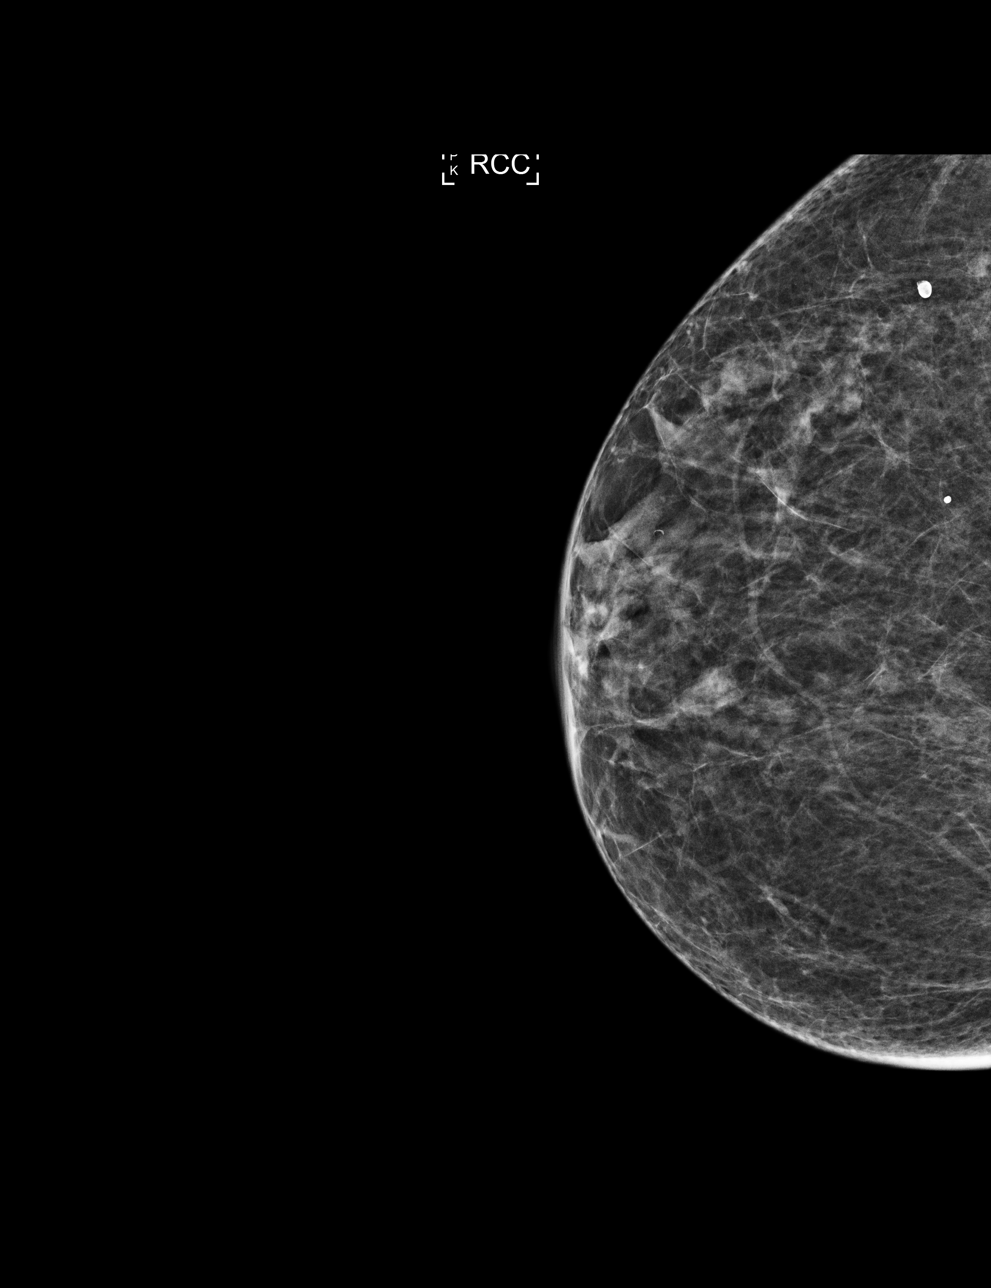

[R MLO]
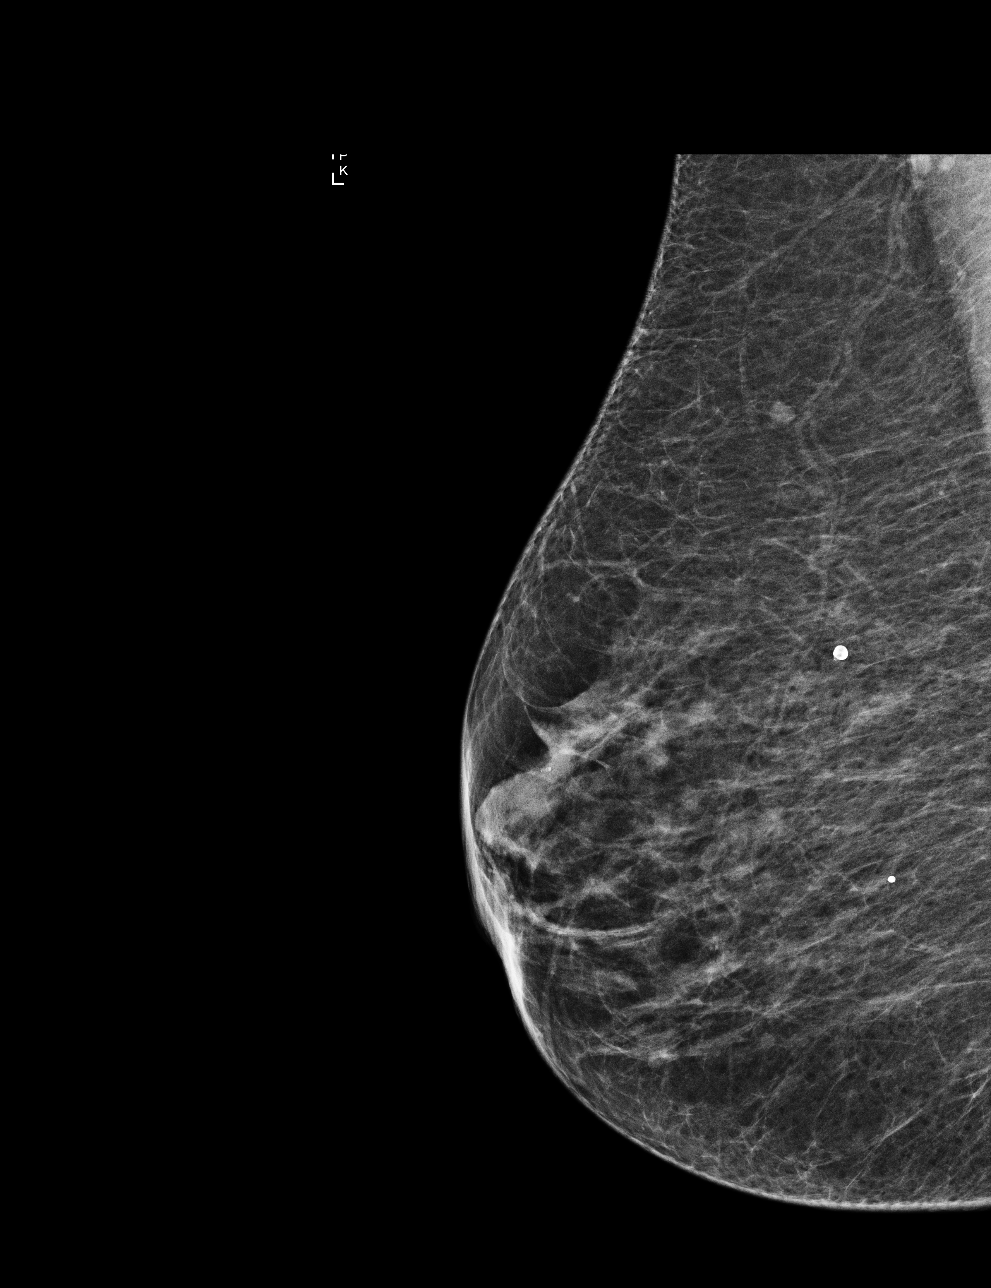

[4 of 4 positions shown; findings below may reference images not displayed]

ACR Breast Density Category b: There are scattered areas of
fibroglandular density.
FINDINGS: There are no findings suspicious for malignancy. Images were
processed with CAD.
IMPRESSION: No mammographic evidence of malignancy. A result letter of this
screening mammogram will be mailed directly to the patient.

RECOMMENDATION:
Screening mammogram in one year. (Code:AS-G-LCT)

BI-RADS CATEGORY  1: Negative.

## 2019-10-07 ENCOUNTER — Telehealth: Payer: Self-pay | Admitting: Cardiology

## 2019-10-07 DIAGNOSIS — I10 Essential (primary) hypertension: Secondary | ICD-10-CM | POA: Diagnosis not present

## 2019-10-07 DIAGNOSIS — I499 Cardiac arrhythmia, unspecified: Secondary | ICD-10-CM | POA: Diagnosis not present

## 2019-10-07 DIAGNOSIS — E559 Vitamin D deficiency, unspecified: Secondary | ICD-10-CM | POA: Diagnosis not present

## 2019-10-07 DIAGNOSIS — Z23 Encounter for immunization: Secondary | ICD-10-CM | POA: Diagnosis not present

## 2019-10-07 DIAGNOSIS — I4891 Unspecified atrial fibrillation: Secondary | ICD-10-CM | POA: Diagnosis not present

## 2019-10-07 DIAGNOSIS — E785 Hyperlipidemia, unspecified: Secondary | ICD-10-CM | POA: Diagnosis not present

## 2019-10-07 NOTE — Telephone Encounter (Signed)
Returned call to Dr. Drema Dallas at [phone] after faxed received. Difficult to see V1 and lead II baseline but appears consistent with afib at rate controlled 76 bpm. Per Dr. Drema Dallas, patient is asymptomatic. Dr. Drema Dallas would like to defer conversation re: anticoagulation to cardiology. Can we get her an APP visit or visit with Dr. Martinique in the near future to discuss anticoagulation and management? I will have the ECG scanned into epic. Thanks.

## 2019-10-07 NOTE — Telephone Encounter (Signed)
Called pt to schedule a first available appointment with APP or Dr. Martinique. Left message to call back.

## 2019-10-08 NOTE — Progress Notes (Signed)
Cardiology Office Note   Date:  10/11/2019   ID:  Wendy [name], DOB [birth year], MRN [id number]  PCP:  Leighton Ruff, MD  Cardiologist:   Capria Cartaya Martinique, MD   Chief Complaint  Patient presents with  . Follow-up  . Atrial Fibrillation      History of Present Illness: Wendy [name] is a 85 y.o. female who is seen at the request of Dr. Drema Dallas for evaluation of atrial fibrillation. Last seen in 2018. She has a history of PACs noted on Ecg in 2016 and 2017. She has a history of HTN and HLD.   More recently she was found to be in Afib with controlled rate. This was part of a routine physcical.  She is asymptomatic.  She  denies any palpitations, dizziness, syncope, dyspnea, or chest pain. BP has been well controlled. She is very active walking 4 miles/day and doing all her own yard work.    Past Medical History:  Diagnosis Date  . Hyperlipidemia   . Hypertension   . Vitamin D deficiency     History reviewed. No pertinent surgical history.   Current Outpatient Medications  Medication Sig Dispense Refill  . aspirin EC 81 MG tablet Take 81 mg by mouth daily.    . benazepril (LOTENSIN) 20 MG tablet Take 20 mg by mouth daily.    . hydrochlorothiazide (HYDRODIURIL) 25 MG tablet Take 25 mg by mouth daily.    . niacin-simvastatin (SIMCOR) 750-20 MG 24 hr tablet Take 0.5 tablets by mouth at bedtime.    . Omega-3 Fatty Acids (FISH OIL PO) Take by mouth.    . Vitamin D, Ergocalciferol, 2000 units CAPS Take 1 capsule by mouth daily.     No current facility-administered medications for this visit.     Allergies:   Patient has no known allergies.    Social History:  The patient  reports that she has never smoked. She has never used smokeless tobacco. She reports that she does not drink alcohol or use drugs.   Family History:  The patient's family history includes Cancer in her brother; Pancreatic cancer in her father.    ROS:  Please see the history of present illness.    Otherwise, review of systems are positive for none.   All other systems are reviewed and negative.    PHYSICAL EXAM: VS:  BP 140/68 (BP Location: Left Arm, Patient Position: Sitting, Cuff Size: Normal)   Pulse 93   Temp (!) 96.8 F (36 C)   Ht 5\' 7"  (1.702 m)   Wt 129 lb (58.5 kg)   BMI 20.20 kg/m  , BMI Body mass index is 20.2 kg/m. GEN: Well nourished, well developed, in no acute distress  HEENT: normal  Neck: no JVD, carotid bruits, or masses Cardiac: IRRR; there is a grade [id number] early diastolic murmur heard at the RUSB to LSB, no rubs or gallops,no edema  Respiratory:  clear to auscultation bilaterally, normal work of breathing GI: soft, nontender, nondistended, + BS MS: no deformity or atrophy  Skin: warm and dry, no rash Neuro:  Strength and sensation are intact Psych: euthymic mood, full affect   EKG:  EKG is not ordered today. The ekg ordered 10/07/19 showed Afib with rate 76. Otherwise normal. I have personally reviewed and interpreted this study.    Recent Labs: No results found for requested labs within last 8760 hours.    Lipid Panel No results found for: CHOL, TRIG, HDL, CHOLHDL, VLDL, LDLCALC, LDLDIRECT  Wt Readings from Last 3 Encounters:  10/11/19 129 lb (58.5 kg)  02/07/18 133 lb (60.3 kg)  10/02/17 131 lb 9.6 oz (59.7 kg)      Other studies Reviewed: Additional studies/ records that were reviewed today include: Labs dated 03/27/17: Cholesterol 124, triglycerides 106, HDL 49, LDL 54. Normal CMET and Hgb.  Dated 10/07/19: cholesterol 141, triglycerides 90, HDL 52, LDL 71. CBC, BMET, AST normal.    ASSESSMENT AND PLAN:  1. Atrial fibrillation. New onset since 2018. Rate is controlled on no medication and she is completely asymptomatic. Will check TFTs and Echo. [place] vasc of 4. Recommend anticoagulation for stroke prevention. Will stop ASA. Unfortunately she has no prescription drug coverage so will not be able to afford a DOAC. Will have her talk to  our pharmacist. May need to start with Coumadin. If she can get Rx coverage for next year we may be able to switch later. Since she is asymptomatic will probably pursue a rate control strategy. 2. Murmur of aortic insufficiency. By exam this is mild. Asymptomatic.  3. HTN  well controlled.  4. HLD well controlled on current therapy    Current medicines are reviewed at length with the patient today.  The patient does not have concerns regarding medicines.  The following changes have been made:  no change  Labs/ tests ordered today include:   No orders of the defined types were placed in this encounter.    Disposition:   FU 3 months  Signed, Akeel Reffner Martinique, MD  10/11/2019 11:27 AM    [facility] [address], [place], [place], [address] Phone [phone], Fax [phone]

## 2019-10-08 NOTE — Telephone Encounter (Signed)
Appointment scheduled via scheduler for 10/12 with Dr. Martinique.

## 2019-10-11 ENCOUNTER — Ambulatory Visit (INDEPENDENT_AMBULATORY_CARE_PROVIDER_SITE_OTHER): Payer: Medicare Other | Admitting: Cardiology

## 2019-10-11 ENCOUNTER — Encounter: Payer: Self-pay | Admitting: Cardiology

## 2019-10-11 ENCOUNTER — Other Ambulatory Visit: Payer: Self-pay

## 2019-10-11 VITALS — BP 140/68 | HR 93 | Temp 96.8°F | Ht 67.0 in | Wt 129.0 lb

## 2019-10-11 DIAGNOSIS — I10 Essential (primary) hypertension: Secondary | ICD-10-CM | POA: Diagnosis not present

## 2019-10-11 DIAGNOSIS — I4819 Other persistent atrial fibrillation: Secondary | ICD-10-CM

## 2019-10-11 DIAGNOSIS — I4891 Unspecified atrial fibrillation: Secondary | ICD-10-CM | POA: Diagnosis not present

## 2019-10-11 NOTE — Patient Instructions (Addendum)
We will check your thyroid and an Echocardiogram  We will have you talk to our pharmacist about blood thinner.  Stop taking ASA

## 2019-10-12 LAB — T4, FREE: Free T4: 1.44 ng/dL (ref 0.82–1.77)

## 2019-10-12 LAB — TSH: TSH: 1.62 u[IU]/mL (ref 0.450–4.500)

## 2019-10-14 ENCOUNTER — Ambulatory Visit (HOSPITAL_COMMUNITY): Payer: Medicare Other | Attending: Cardiovascular Disease

## 2019-10-14 ENCOUNTER — Other Ambulatory Visit: Payer: Self-pay

## 2019-10-14 DIAGNOSIS — I4891 Unspecified atrial fibrillation: Secondary | ICD-10-CM

## 2019-10-18 ENCOUNTER — Other Ambulatory Visit: Payer: Self-pay

## 2019-10-18 ENCOUNTER — Ambulatory Visit (INDEPENDENT_AMBULATORY_CARE_PROVIDER_SITE_OTHER): Payer: Medicare Other | Admitting: Pharmacist Clinician (PhC)/ Clinical Pharmacy Specialist

## 2019-10-18 DIAGNOSIS — I4819 Other persistent atrial fibrillation: Secondary | ICD-10-CM

## 2019-10-20 ENCOUNTER — Other Ambulatory Visit: Payer: Self-pay | Admitting: Family Medicine

## 2019-10-20 ENCOUNTER — Telehealth: Payer: Self-pay | Admitting: Cardiology

## 2019-10-20 DIAGNOSIS — Z1231 Encounter for screening mammogram for malignant neoplasm of breast: Secondary | ICD-10-CM

## 2019-10-20 NOTE — Telephone Encounter (Signed)
New Message ° ° ° °Patient is returning call in reference to echocardiogram results. Please call.  °

## 2019-10-20 NOTE — Telephone Encounter (Signed)
Spoke to patient echo results given. 

## 2019-10-20 NOTE — Telephone Encounter (Signed)
Sent to primary nurse 

## 2019-10-20 NOTE — Progress Notes (Signed)
Patient was in office to discuss options for anticoagulation therapy.  New onset AF.  She currently does not have a Part D drug plan, although she has signed up for one for 2021.  Her husband is currently on Xarelto and has been doing well with this.  She would like to take this as well.    85 F, 58.5 kg, SCr 0.9 (09/2019) => CrCl 43.7  Based on this her dose will be 15 mg once daily.    Pt was started on Xarelto for atrial fibrillation on October 19    Reviewed patients medication list.  Pt is not currently on any combined P-gp and strong CYP3A4 inhibitors/inducers (ketoconazole, traconazole, ritonavir, carbamazepine, phenytoin, rifampin, St. John's wort).  Reviewed labs.  SCr 0.9, Weight 58.5 kg, CrCl- 43.7.  Dose appropriate based on CrCl.  A full discussion of the nature of anticoagulants has been carried out.  A benefit/risk analysis has been presented to the patient, so that they understand the justification for choosing anticoagulation with Xarelto at this time.  The need for compliance is stressed.  Pt is aware to take the medication once daily with the largest meal of the day.  Side effects of potential bleeding are discussed, including unusual colored urine or stools, coughing up blood or coffee ground emesis, nose bleeds or serious fall or head trauma.  Discussed signs and symptoms of stroke. The patient should avoid any OTC items containing aspirin or ibuprofen.  Avoid alcohol consumption.   Call if any signs of abnormal bleeding.  Discussed financial obligations and resolved any difficulty in obtaining medication.

## 2019-11-12 ENCOUNTER — Ambulatory Visit: Payer: Medicare Other

## 2019-12-30 NOTE — Progress Notes (Signed)
Cardiology Office Note   Date:  01/04/2020   ID:  Wendy [name], DOB May 17, 1934, MRN [id number]  PCP:  Leighton Ruff, MD  Cardiologist:   Stela Iwasaki [place], MD   Chief Complaint  Patient presents with  . Atrial Fibrillation      History of Present Illness: Wendy [name] is a 85 y.o. female who is seen for follow up evaluation of atrial fibrillation. She has a history of PACs noted on Ecg in 2016 and 2017. She has a history of HTN and HLD.   Seen in October 2020  she was found to be in Afib with controlled rate. This was part of a routine physcical.  She was asymptomatic.  She  denies any palpitations, dizziness, syncope, dyspnea, or chest pain. BP has been well controlled. She is very active walking 4-5 miles/day and doing all her own yard work. She was started on Xarelto. Echo showed normal LV function. Mild biatrial enlargement. Mild to moderate MR with calcified valve and annulus.   On follow up today she continues to feel very well without limitation. No SOB, chest pain or fatigue.   Past Medical History:  Diagnosis Date  . Hyperlipidemia   . Hypertension   . Vitamin D deficiency     History reviewed. No pertinent surgical history.   Current Outpatient Medications  Medication Sig Dispense Refill  . benazepril (LOTENSIN) 20 MG tablet Take 20 mg by mouth daily.    . hydrochlorothiazide (HYDRODIURIL) 25 MG tablet Take 25 mg by mouth daily.    . niacin-simvastatin (SIMCOR) 750-20 MG 24 hr tablet Take 0.5 tablets by mouth at bedtime.    . Omega-3 Fatty Acids (FISH OIL PO) Take by mouth.    . rivaroxaban (XARELTO) 15 MG TABS tablet Take 1 tablet (15 mg total) by mouth daily. 90 tablet 3  . Vitamin D, Ergocalciferol, 2000 units CAPS Take 1 capsule by mouth daily.     No current facility-administered medications for this visit.    Allergies:   Patient has no known allergies.    Social History:  The patient  reports that she has never smoked. She has never used  smokeless tobacco. She reports that she does not drink alcohol or use drugs.   Family History:  The patient's family history includes Cancer in her brother; Pancreatic cancer in her father.    ROS:  Please see the history of present illness.   Otherwise, review of systems are positive for none.   All other systems are reviewed and negative.    PHYSICAL EXAM: VS:  BP (!) 142/74   Pulse 92   Temp (!) 95.7 F (35.4 C)   Ht 5\' 7"  (1.702 m)   Wt 132 lb 3.2 oz (60 kg)   SpO2 100%   BMI 20.71 kg/m  , BMI Body mass index is 20.71 kg/m. GEN: Well nourished, well developed, in no acute distress  HEENT: normal  Neck: no JVD, carotid bruits, or masses Cardiac: IRRR; there is a grade [id number] early diastolic murmur heard at the RUSB to LSB, no rubs or gallops,no edema  Respiratory:  clear to auscultation bilaterally, normal work of breathing GI: soft, nontender, nondistended, + BS MS: no deformity or atrophy  Skin: warm and dry, no rash Neuro:  Strength and sensation are intact Psych: euthymic mood, full affect   EKG:  EKG is not ordered today.   Recent Labs: 10/11/2019: TSH 1.620    Lipid Panel No results found for: CHOL,  TRIG, HDL, CHOLHDL, VLDL, LDLCALC, LDLDIRECT    Wt Readings from Last 3 Encounters:  01/04/20 132 lb 3.2 oz (60 kg)  10/11/19 129 lb (58.5 kg)  02/07/18 133 lb (60.3 kg)      Other studies Reviewed: Additional studies/ records that were reviewed today include: Labs dated 03/27/17: Cholesterol 124, triglycerides 106, HDL 49, LDL 54. Normal CMET and Hgb.  Dated 10/07/19: cholesterol 141, triglycerides 90, HDL 52, LDL 71. CBC, BMET, AST normal.   Echo 10/14/19:  IMPRESSIONS    1. Left ventricular ejection fraction, by visual estimation, is 55 to 60%. The left ventricle has normal function. There is no left ventricular hypertrophy.  2. Global right ventricle has normal systolic function.The right ventricular size is normal. No increase in right ventricular  wall thickness.  3. Left atrial size was mildly dilated.  4. Right atrial size was mildly dilated.  5. Moderate calcification of the mitral valve leaflet(s).  6. Moderate mitral annular calcification.  7. Moderate thickening of the mitral valve leaflet(s).  8. The mitral valve is degenerative. Mild to moderate mitral valve regurgitation.  9. MR not well interrogated Restricted posterior leaflet motion with posteriorly directed jet can consider TEE to further evaluate. 10. The tricuspid valve is normal in structure. Tricuspid valve regurgitation moderate. 11. The aortic valve is tricuspid Aortic valve regurgitation is mild to moderate by color flow Doppler. 12. There is Moderate calcification of the aortic valve. 13. There is Moderate thickening of the aortic valve. 14. The pulmonic valve was grossly normal. Pulmonic valve regurgitation is mild by color flow Doppler. 15. Aortic dilatation noted. 16. There is mild dilatation of the aortic root measuring 38 mm. 17. Moderately elevated pulmonary artery systolic pressure.  ASSESSMENT AND PLAN:  1. Atrial fibrillation. New onset since 2018. Rate is controlled on no medication and she is  asymptomatic. [place] vasc of 4. Recommend anticoagulation for stroke prevention. Now on Xarelto 15 mg per pharmacy recommendation. Given that she is asymptomatic we will manage with rate control. 2. Mild to moderate mitral insufficiency. 3. HTN  well controlled.  4. HLD well controlled on current therapy    Current medicines are reviewed at length with the patient today.  The patient does not have concerns regarding medicines.  The following changes have been made:  no change  Labs/ tests ordered today include:   No orders of the defined types were placed in this encounter.    Disposition:   FU 6 months  Signed, Wendy Mcmasters [place], MD  01/04/2020 11:49 AM    [facility] [address] [place], [place], [address] Phone [phone],  Fax [phone]

## 2020-01-04 ENCOUNTER — Ambulatory Visit (INDEPENDENT_AMBULATORY_CARE_PROVIDER_SITE_OTHER): Payer: Medicare Other | Admitting: Cardiology

## 2020-01-04 ENCOUNTER — Other Ambulatory Visit: Payer: Self-pay

## 2020-01-04 ENCOUNTER — Encounter: Payer: Self-pay | Admitting: Cardiology

## 2020-01-04 ENCOUNTER — Encounter (INDEPENDENT_AMBULATORY_CARE_PROVIDER_SITE_OTHER): Payer: Self-pay

## 2020-01-04 VITALS — BP 142/74 | HR 92 | Temp 95.7°F | Ht 67.0 in | Wt 132.2 lb

## 2020-01-04 DIAGNOSIS — I10 Essential (primary) hypertension: Secondary | ICD-10-CM

## 2020-01-04 DIAGNOSIS — I4819 Other persistent atrial fibrillation: Secondary | ICD-10-CM | POA: Diagnosis not present

## 2020-01-04 MED ORDER — RIVAROXABAN 15 MG PO TABS: 15.0000 mg | ORAL_TABLET | Freq: Every day | ORAL | 3 refills | Status: DC

## 2020-01-27 ENCOUNTER — Ambulatory Visit: Payer: Medicare Other

## 2020-02-01 ENCOUNTER — Ambulatory Visit: Payer: Medicare Other

## 2020-02-05 ENCOUNTER — Ambulatory Visit: Payer: Medicare Other | Attending: Internal Medicine

## 2020-02-05 DIAGNOSIS — Z23 Encounter for immunization: Secondary | ICD-10-CM | POA: Insufficient documentation

## 2020-02-05 NOTE — Progress Notes (Signed)
   Covid-19 Vaccination Clinic  Name:  Wendy [name]    MRN: [id number] DOB: [birth year]  02/05/2020  [name] was observed post Covid-19 immunization for 15 minutes without incidence. She was provided with Vaccine Information Sheet and instruction to access the V-Safe system.   [name] was instructed to call 911 with any severe reactions post vaccine: Marland Kitchen Difficulty breathing  . Swelling of your face and throat  . A fast heartbeat  . A bad rash all over your body  . Dizziness and weakness    Immunizations Administered    Name Date Dose VIS Date Route   Pfizer COVID-19 Vaccine 02/05/2020  9:04 AM 0.3 mL 12/10/2019 Intramuscular   Manufacturer: [place]   Lot: [id number]   [place]: [id number]

## 2020-02-29 ENCOUNTER — Ambulatory Visit: Payer: Medicare Other

## 2020-02-29 ENCOUNTER — Ambulatory Visit: Payer: Medicare Other | Attending: Internal Medicine

## 2020-02-29 DIAGNOSIS — Z23 Encounter for immunization: Secondary | ICD-10-CM | POA: Insufficient documentation

## 2020-02-29 NOTE — Progress Notes (Signed)
   Covid-19 Vaccination Clinic  Name:  OCEANIA SHENBERGER    MRN: [id number] DOB: 1934/03/12  02/29/2020  Ms. Milbauer was observed post Covid-19 immunization for 15 minutes without incident. She was provided with Vaccine Information Sheet and instruction to access the V-Safe system.   Ms. Sheesley was instructed to call 911 with any severe reactions post vaccine: Marland Kitchen Difficulty breathing  . Swelling of face and throat  . A fast heartbeat  . A bad rash all over body  . Dizziness and weakness   Immunizations Administered    Name Date Dose VIS Date Route   Pfizer COVID-19 Vaccine 02/29/2020  1:15 PM 0.3 mL 12/10/2019 Intramuscular   Manufacturer: [place]   Lot: [id number]   [place]: [id number]

## 2020-04-06 DIAGNOSIS — I10 Essential (primary) hypertension: Secondary | ICD-10-CM | POA: Diagnosis not present

## 2020-04-06 DIAGNOSIS — E559 Vitamin D deficiency, unspecified: Secondary | ICD-10-CM | POA: Diagnosis not present

## 2020-04-06 DIAGNOSIS — E785 Hyperlipidemia, unspecified: Secondary | ICD-10-CM | POA: Diagnosis not present

## 2020-04-06 DIAGNOSIS — Z131 Encounter for screening for diabetes mellitus: Secondary | ICD-10-CM | POA: Diagnosis not present

## 2020-04-06 DIAGNOSIS — I4891 Unspecified atrial fibrillation: Secondary | ICD-10-CM | POA: Diagnosis not present

## 2020-04-06 DIAGNOSIS — Z7901 Long term (current) use of anticoagulants: Secondary | ICD-10-CM | POA: Diagnosis not present

## 2020-04-20 DIAGNOSIS — R7989 Other specified abnormal findings of blood chemistry: Secondary | ICD-10-CM | POA: Diagnosis not present

## 2020-07-05 DIAGNOSIS — R7303 Prediabetes: Secondary | ICD-10-CM | POA: Diagnosis not present

## 2020-07-07 NOTE — Progress Notes (Signed)
Cardiology Office Note   Date:  07/11/2020   ID:  Wendy [name], DOB [birth year], MRN [id number]  PCP:  Leighton Ruff, MD  Cardiologist:   Janelli Welling [place], MD   Chief Complaint  Patient presents with  . Atrial Fibrillation      History of Present Illness: Wendy [name] is a 86 y.o. female who is seen for follow up evaluation of atrial fibrillation. She has a history of PACs noted on Ecg in 2016 and 2017. She has a history of HTN and HLD.   Seen in October 2020  she was found to be in Afib with controlled rate. This was part of a routine physcical.  She was asymptomatic.  She  denies any palpitations, dizziness, syncope, dyspnea, or chest pain. BP has been well controlled. She is very active walking 4-5 miles/day and doing all her own yard work. She was started on Xarelto. Echo showed normal LV function. Mild biatrial enlargement. Mild to moderate MR with calcified valve and annulus.   On follow up today she continues to feel very well without limitation. No SOB, chest pain or fatigue. She remains very active. BP at home typically 120/50-60.    Past Medical History:  Diagnosis Date  . Hyperlipidemia   . Hypertension   . Vitamin D deficiency     History reviewed. No pertinent surgical history.   Current Outpatient Medications  Medication Sig Dispense Refill  . benazepril (LOTENSIN) 20 MG tablet Take 20 mg by mouth daily.    . hydrochlorothiazide (HYDRODIURIL) 25 MG tablet Take 25 mg by mouth daily.    . niacin-simvastatin (SIMCOR) 750-20 MG 24 hr tablet Take 0.5 tablets by mouth at bedtime.    . Omega-3 Fatty Acids (FISH OIL PO) Take by mouth.    . rivaroxaban (XARELTO) 15 MG TABS tablet Take 1 tablet (15 mg total) by mouth daily. 90 tablet 3  . Vitamin D, Ergocalciferol, 2000 units CAPS Take 1 capsule by mouth daily.     No current facility-administered medications for this visit.    Allergies:   Patient has no known allergies.    Social History:  The patient   reports that she has never smoked. She has never used smokeless tobacco. She reports that she does not drink alcohol and does not use drugs.   Family History:  The patient's family history includes Cancer in her brother; Pancreatic cancer in her father.    ROS:  Please see the history of present illness.   Otherwise, review of systems are positive for none.   All other systems are reviewed and negative.    PHYSICAL EXAM: VS:  BP (!) 150/70   Pulse 75   Temp (!) 96.6 F (35.9 C)   Ht 5\' 7"  (1.702 m)   Wt 129 lb 6.4 oz (58.7 kg)   SpO2 96%   BMI 20.27 kg/m  , BMI Body mass index is 20.27 kg/m. GEN: Well nourished, well developed, in no acute distress  HEENT: normal  Neck: no JVD, carotid bruits, or masses Cardiac: IRRR; there is a grade 3-3/5 early diastolic murmur heard at the RUSB to LSB, no rubs or gallops,no edema  Respiratory:  clear to auscultation bilaterally, normal work of breathing GI: soft, nontender, nondistended, + BS MS: no deformity or atrophy  Skin: warm and dry, no rash Neuro:  Strength and sensation are intact Psych: euthymic mood, full affect   EKG:  EKG is  ordered today. afib with rate 75 otherwise  normal. I have personally reviewed and interpreted this study.    Recent Labs: 10/11/2019: TSH 1.620    Lipid Panel No results found for: CHOL, TRIG, HDL, CHOLHDL, VLDL, LDLCALC, LDLDIRECT    Wt Readings from Last 3 Encounters:  07/11/20 129 lb 6.4 oz (58.7 kg)  01/04/20 132 lb 3.2 oz (60 kg)  10/11/19 129 lb (58.5 kg)      Other studies Reviewed: Additional studies/ records that were reviewed today include: Labs dated 03/27/17: Cholesterol 124, triglycerides 106, HDL 49, LDL 54. Normal CMET and Hgb.  Dated 10/07/19: cholesterol 141, triglycerides 90, HDL 52, LDL 71. CBC, BMET, AST normal.  Dated 04/06/20: cholesterol 135, triglycerides 73, HDL 49, LDL 71. CBC and CMET normal.  Dated 07/05/20: A1c 6.2%.   Echo 10/14/19:  IMPRESSIONS    1. Left  ventricular ejection fraction, by visual estimation, is 55 to 60%. The left ventricle has normal function. There is no left ventricular hypertrophy.  2. Global right ventricle has normal systolic function.The right ventricular size is normal. No increase in right ventricular wall thickness.  3. Left atrial size was mildly dilated.  4. Right atrial size was mildly dilated.  5. Moderate calcification of the mitral valve leaflet(s).  6. Moderate mitral annular calcification.  7. Moderate thickening of the mitral valve leaflet(s).  8. The mitral valve is degenerative. Mild to moderate mitral valve regurgitation.  9. MR not well interrogated Restricted posterior leaflet motion with posteriorly directed jet can consider TEE to further evaluate. 10. The tricuspid valve is normal in structure. Tricuspid valve regurgitation moderate. 11. The aortic valve is tricuspid Aortic valve regurgitation is mild to moderate by color flow Doppler. 12. There is Moderate calcification of the aortic valve. 13. There is Moderate thickening of the aortic valve. 14. The pulmonic valve was grossly normal. Pulmonic valve regurgitation is mild by color flow Doppler. 15. Aortic dilatation noted. 16. There is mild dilatation of the aortic root measuring 38 mm. 17. Moderately elevated pulmonary artery systolic pressure.  ASSESSMENT AND PLAN:  1. Atrial fibrillation. New onset since 2018. Rate is controlled on no medication and she is  asymptomatic. [place] vasc of 4.  Now on Xarelto 15 mg per pharmacy recommendation. Given that she is asymptomatic we will manage with rate control. 2. Mild to moderate mitral insufficiency. 3. HTN  well controlled.  4. HLD well controlled on current therapy    Current medicines are reviewed at length with the patient today.  The patient does not have concerns regarding medicines.  The following changes have been made:  no change  Labs/ tests ordered today include:   No orders of the  defined types were placed in this encounter.    Disposition:   FU one year  Signed, Gennaro Lizotte [place], MD  07/11/2020 11:03 AM    [facility] [address] [place], [place], [address] Phone [phone], Fax [phone]

## 2020-07-11 ENCOUNTER — Encounter: Payer: Self-pay | Admitting: Cardiology

## 2020-07-11 ENCOUNTER — Ambulatory Visit (INDEPENDENT_AMBULATORY_CARE_PROVIDER_SITE_OTHER): Payer: Medicare Other | Admitting: Cardiology

## 2020-07-11 ENCOUNTER — Other Ambulatory Visit: Payer: Self-pay

## 2020-07-11 VITALS — BP 150/70 | HR 75 | Temp 96.6°F | Ht 67.0 in | Wt 129.4 lb

## 2020-07-11 DIAGNOSIS — I4819 Other persistent atrial fibrillation: Secondary | ICD-10-CM

## 2020-07-11 DIAGNOSIS — I10 Essential (primary) hypertension: Secondary | ICD-10-CM

## 2020-09-21 DIAGNOSIS — Z23 Encounter for immunization: Secondary | ICD-10-CM | POA: Diagnosis not present

## 2020-10-17 ENCOUNTER — Ambulatory Visit: Payer: Medicare Other | Attending: Internal Medicine

## 2020-10-17 ENCOUNTER — Other Ambulatory Visit (HOSPITAL_BASED_OUTPATIENT_CLINIC_OR_DEPARTMENT_OTHER): Payer: Self-pay | Admitting: Internal Medicine

## 2020-10-17 DIAGNOSIS — Z23 Encounter for immunization: Secondary | ICD-10-CM

## 2020-10-17 NOTE — Progress Notes (Signed)
   Covid-19 Vaccination Clinic  Name:  Wendy [name]    MRN: [id number] DOB: Feb 24, 1934  10/17/2020  [name] was observed post Covid-19 immunization for 15 minutes without incident. She was provided with Vaccine Information Sheet and instruction to access the V-Safe system.   [name] was instructed to call 911 with any severe reactions post vaccine: Marland Kitchen Difficulty breathing  . Swelling of face and throat  . A fast heartbeat  . A bad rash all over body  . Dizziness and weakness

## 2020-10-24 DIAGNOSIS — E559 Vitamin D deficiency, unspecified: Secondary | ICD-10-CM | POA: Diagnosis not present

## 2020-10-24 DIAGNOSIS — E785 Hyperlipidemia, unspecified: Secondary | ICD-10-CM | POA: Diagnosis not present

## 2020-10-24 DIAGNOSIS — I4891 Unspecified atrial fibrillation: Secondary | ICD-10-CM | POA: Diagnosis not present

## 2020-10-24 DIAGNOSIS — Z7901 Long term (current) use of anticoagulants: Secondary | ICD-10-CM | POA: Diagnosis not present

## 2020-10-24 DIAGNOSIS — I10 Essential (primary) hypertension: Secondary | ICD-10-CM | POA: Diagnosis not present

## 2020-10-24 DIAGNOSIS — R7303 Prediabetes: Secondary | ICD-10-CM | POA: Diagnosis not present

## 2020-10-24 MED FILL — PFIZER-BIONTECH COVID-19 VA: 30 | 1 days supply | Qty: 0 | Fill #0

## 2020-10-31 ENCOUNTER — Telehealth: Payer: Self-pay | Admitting: Cardiology

## 2020-10-31 NOTE — Telephone Encounter (Signed)
° ° °  Patient calling the office for samples of medication:   1.  What medication and dosage are you requesting samples for?  rivaroxaban (XARELTO) 15 MG TABS tablet    2.  Are you currently out of this medication? Yes, pt said she is on the donut hole and she needs sample to last until next year

## 2020-10-31 NOTE — Telephone Encounter (Signed)
Medication samples have been provided to the patient.  Drug name: Xarelto  Qty: 2 bottles  LOT: [id number]  Exp.Date: 3/23  Samples left at front desk for patient pick-up. Patient notified.

## 2020-11-20 DIAGNOSIS — R7989 Other specified abnormal findings of blood chemistry: Secondary | ICD-10-CM | POA: Diagnosis not present

## 2021-01-22 NOTE — Progress Notes (Signed)
Cardiology Office Note   Date:  01/25/2021   ID:  Wendy [name], DOB [birth year], MRN [id number]  PCP:  Kathyrn Lass, MD  Cardiologist:   Ishaan Villamar [place], MD   Chief Complaint  Patient presents with  . Follow-up    Follow-up  . Atrial Fibrillation      History of Present Illness: Wendy [name] is a 87 y.o. female who is seen for follow up evaluation of atrial fibrillation. She has a history of PACs noted on Ecg in 2016 and 2017. She has a history of HTN and HLD.   Seen in October 2020  she was found to be in Afib with controlled rate. This was part of a routine physcical.  She was asymptomatic.  She  denies any palpitations, dizziness, syncope, dyspnea, or chest pain. BP has been well controlled. She is very active walking 4-5 miles/day and doing all her own yard work. She was started on Xarelto. Echo showed normal LV function. Mild biatrial enlargement. Mild to moderate MR with calcified valve and annulus.   On follow up today she continues to feel very well without limitation. No SOB, chest pain or fatigue. She remains very active. BP at home is well controlled. Walking 4 miles/day. No bleeding,   Past Medical History:  Diagnosis Date  . Hyperlipidemia   . Hypertension   . Vitamin D deficiency     History reviewed. No pertinent surgical history.   Current Outpatient Medications  Medication Sig Dispense Refill  . benazepril (LOTENSIN) 20 MG tablet Take 20 mg by mouth daily.    . hydrochlorothiazide (HYDRODIURIL) 25 MG tablet Take 25 mg by mouth daily.    . niacin-simvastatin (SIMCOR) 750-20 MG 24 hr tablet Take 0.5 tablets by mouth at bedtime.    . Omega-3 Fatty Acids (FISH OIL PO) Take by mouth.    . Vitamin D, Ergocalciferol, 2000 units CAPS Take 1 capsule by mouth daily.    . Rivaroxaban (XARELTO) 15 MG TABS tablet Take 1 tablet (15 mg total) by mouth daily. 90 tablet 3   No current facility-administered medications for this visit.    Allergies:   Patient has  no known allergies.    Social History:  The patient  reports that she has never smoked. She has never used smokeless tobacco. She reports that she does not drink alcohol and does not use drugs.   Family History:  The patient's family history includes Cancer in her brother; Pancreatic cancer in her father.    ROS:  Please see the history of present illness.   Otherwise, review of systems are positive for none.   All other systems are reviewed and negative.    PHYSICAL EXAM: VS:  BP 140/65 (BP Location: Right Arm, Patient Position: Sitting)   Pulse 82   Ht 5\' 7"  (1.702 m)   Wt 130 lb (59 kg)   SpO2 99%   BMI 20.36 kg/m  , BMI Body mass index is 20.36 kg/m. GEN: Well nourished, well developed, in no acute distress  HEENT: normal  Neck: no JVD, carotid bruits, or masses Cardiac: IRRR; there is a grade 9-3/2 early diastolic murmur heard at the RUSB to LSB, no rubs or gallops,no edema  Respiratory:  clear to auscultation bilaterally, normal work of breathing GI: soft, nontender, nondistended, + BS MS: no deformity or atrophy  Skin: warm and dry, no rash Neuro:  Strength and sensation are intact Psych: euthymic mood, full affect   EKG:  EKG is  not ordered today.     Recent Labs: No results found for requested labs within last 8760 hours.    Lipid Panel No results found for: CHOL, TRIG, HDL, CHOLHDL, VLDL, LDLCALC, LDLDIRECT    Wt Readings from Last 3 Encounters:  01/25/21 130 lb (59 kg)  07/11/20 129 lb 6.4 oz (58.7 kg)  01/04/20 132 lb 3.2 oz (60 kg)      Other studies Reviewed: Additional studies/ records that were reviewed today include: Labs dated 03/27/17: Cholesterol 124, triglycerides 106, HDL 49, LDL 54. Normal CMET and Hgb.  Dated 10/07/19: cholesterol 141, triglycerides 90, HDL 52, LDL 71. CBC, BMET, AST normal.  Dated 04/06/20: cholesterol 135, triglycerides 73, HDL 49, LDL 71. CBC and CMET normal.  Dated 07/05/20: A1c 6.2%.  Dated 10/24/20: A1c 6.1%.  cholesterol 132, triglycerides 98, HDL 47, LDL 66. CMET and CBC normal.  Echo 10/14/19:  IMPRESSIONS    1. Left ventricular ejection fraction, by visual estimation, is 55 to 60%. The left ventricle has normal function. There is no left ventricular hypertrophy.  2. Global right ventricle has normal systolic function.The right ventricular size is normal. No increase in right ventricular wall thickness.  3. Left atrial size was mildly dilated.  4. Right atrial size was mildly dilated.  5. Moderate calcification of the mitral valve leaflet(s).  6. Moderate mitral annular calcification.  7. Moderate thickening of the mitral valve leaflet(s).  8. The mitral valve is degenerative. Mild to moderate mitral valve regurgitation.  9. MR not well interrogated Restricted posterior leaflet motion with posteriorly directed jet can consider TEE to further evaluate. 10. The tricuspid valve is normal in structure. Tricuspid valve regurgitation moderate. 11. The aortic valve is tricuspid Aortic valve regurgitation is mild to moderate by color flow Doppler. 12. There is Moderate calcification of the aortic valve. 13. There is Moderate thickening of the aortic valve. 14. The pulmonic valve was grossly normal. Pulmonic valve regurgitation is mild by color flow Doppler. 15. Aortic dilatation noted. 16. There is mild dilatation of the aortic root measuring 38 mm. 17. Moderately elevated pulmonary artery systolic pressure.  ASSESSMENT AND PLAN:  1. Atrial fibrillation.  Rate is controlled on no medication and she is  asymptomatic. [place] vasc of 4.  Now on Xarelto 15 mg per pharmacy recommendation. Continue current therapy 2. Mild to moderate mitral insufficiency. 3. HTN  well controlled.  4. HLD well controlled on current therapy    Current medicines are reviewed at length with the patient today.  The patient does not have concerns regarding medicines.  The following changes have been made:  no  change  Labs/ tests ordered today include:   No orders of the defined types were placed in this encounter.    Disposition:   FU one year  Signed, Lazariah Savard [place], MD  01/25/2021 10:50 AM    [facility] [address], [place], [place], [address] Phone [phone], Fax [phone]

## 2021-01-25 ENCOUNTER — Ambulatory Visit (INDEPENDENT_AMBULATORY_CARE_PROVIDER_SITE_OTHER): Payer: Medicare Other | Admitting: Cardiology

## 2021-01-25 ENCOUNTER — Encounter: Payer: Self-pay | Admitting: Cardiology

## 2021-01-25 ENCOUNTER — Other Ambulatory Visit: Payer: Self-pay

## 2021-01-25 VITALS — BP 140/65 | HR 82 | Ht 67.0 in | Wt 130.0 lb

## 2021-01-25 DIAGNOSIS — I4819 Other persistent atrial fibrillation: Secondary | ICD-10-CM | POA: Diagnosis not present

## 2021-01-25 DIAGNOSIS — I10 Essential (primary) hypertension: Secondary | ICD-10-CM | POA: Diagnosis not present

## 2021-01-25 MED ORDER — RIVAROXABAN 15 MG PO TABS: 15.0000 mg | ORAL_TABLET | Freq: Every day | ORAL | 3 refills | Status: DC

## 2021-04-25 DIAGNOSIS — I10 Essential (primary) hypertension: Secondary | ICD-10-CM | POA: Diagnosis not present

## 2021-04-25 DIAGNOSIS — R7303 Prediabetes: Secondary | ICD-10-CM | POA: Diagnosis not present

## 2021-04-25 DIAGNOSIS — B373 Candidiasis of vulva and vagina: Secondary | ICD-10-CM | POA: Diagnosis not present

## 2021-04-25 DIAGNOSIS — E785 Hyperlipidemia, unspecified: Secondary | ICD-10-CM | POA: Diagnosis not present

## 2021-04-25 DIAGNOSIS — Z6820 Body mass index (BMI) 20.0-20.9, adult: Secondary | ICD-10-CM | POA: Diagnosis not present

## 2021-04-25 DIAGNOSIS — I4891 Unspecified atrial fibrillation: Secondary | ICD-10-CM | POA: Diagnosis not present

## 2021-04-25 DIAGNOSIS — D6869 Other thrombophilia: Secondary | ICD-10-CM | POA: Diagnosis not present

## 2021-05-23 DIAGNOSIS — H353131 Nonexudative age-related macular degeneration, bilateral, early dry stage: Secondary | ICD-10-CM | POA: Diagnosis not present

## 2021-05-23 DIAGNOSIS — H26493 Other secondary cataract, bilateral: Secondary | ICD-10-CM | POA: Diagnosis not present

## 2021-05-23 DIAGNOSIS — H3540 Unspecified peripheral retinal degeneration: Secondary | ICD-10-CM | POA: Diagnosis not present

## 2021-07-10 ENCOUNTER — Telehealth: Payer: Self-pay | Admitting: Cardiology

## 2021-07-10 NOTE — Telephone Encounter (Signed)
Nurse working remote today but confirmed Xarelto 15 mg samples available for pick.  Pt made aware and verbalized understanding.

## 2021-07-10 NOTE — Telephone Encounter (Signed)
Patient calling the office for samples of medication:   1.  What medication and dosage are you requesting samples for? Rivaroxaban (XARELTO) 15 MG TABS tablet  2.  Are you currently out of this medication? No

## 2021-07-12 DIAGNOSIS — H18413 Arcus senilis, bilateral: Secondary | ICD-10-CM | POA: Diagnosis not present

## 2021-07-12 DIAGNOSIS — H02831 Dermatochalasis of right upper eyelid: Secondary | ICD-10-CM | POA: Diagnosis not present

## 2021-07-12 DIAGNOSIS — Z961 Presence of intraocular lens: Secondary | ICD-10-CM | POA: Diagnosis not present

## 2021-07-12 DIAGNOSIS — H26492 Other secondary cataract, left eye: Secondary | ICD-10-CM | POA: Diagnosis not present

## 2021-07-17 DIAGNOSIS — Z23 Encounter for immunization: Secondary | ICD-10-CM | POA: Diagnosis not present

## 2021-08-08 DIAGNOSIS — R300 Dysuria: Secondary | ICD-10-CM | POA: Diagnosis not present

## 2021-09-20 DIAGNOSIS — Z23 Encounter for immunization: Secondary | ICD-10-CM | POA: Diagnosis not present

## 2021-11-06 DIAGNOSIS — D6869 Other thrombophilia: Secondary | ICD-10-CM | POA: Diagnosis not present

## 2021-11-06 DIAGNOSIS — E785 Hyperlipidemia, unspecified: Secondary | ICD-10-CM | POA: Diagnosis not present

## 2021-11-06 DIAGNOSIS — Z0000 Encounter for general adult medical examination without abnormal findings: Secondary | ICD-10-CM | POA: Diagnosis not present

## 2021-11-06 DIAGNOSIS — Z23 Encounter for immunization: Secondary | ICD-10-CM | POA: Diagnosis not present

## 2021-11-06 DIAGNOSIS — R7303 Prediabetes: Secondary | ICD-10-CM | POA: Diagnosis not present

## 2021-11-06 DIAGNOSIS — I4891 Unspecified atrial fibrillation: Secondary | ICD-10-CM | POA: Diagnosis not present

## 2021-11-06 DIAGNOSIS — I10 Essential (primary) hypertension: Secondary | ICD-10-CM | POA: Diagnosis not present

## 2021-12-18 ENCOUNTER — Telehealth: Payer: Self-pay | Admitting: *Deleted

## 2021-12-18 MED ORDER — RIVAROXABAN 15 MG PO TABS: 15.0000 mg | ORAL_TABLET | Freq: Every day | ORAL | 0 refills | Status: DC

## 2021-12-18 NOTE — Telephone Encounter (Signed)
Patient's husband here today  to see Dr Martin[place].  Request samples for patient . Both are in the doughnut hole until Jan 20223  3 bottles samples given .

## 2022-02-07 NOTE — Progress Notes (Signed)
Cardiology Office Note   Date:  02/12/2022   ID:  Wendy [name], DOB March 13, 1934, MRN [id number]  PCP:  Kathyrn Lass, MD  Cardiologist:   Saloni Lablanc [place], MD   Chief Complaint  Patient presents with   Atrial Fibrillation      History of Present Illness: Wendy [name] is a 88 y.o. female who is seen for follow up evaluation of atrial fibrillation. She has a history of PACs noted on Ecg in 2016 and 2017. She has a history of HTN and HLD.   Seen in October 2020  she was found to be in Afib with controlled rate. This was part of a routine physcical.  She was asymptomatic.  She  denies any palpitations, dizziness, syncope, dyspnea, or chest pain. BP has been well controlled. She is very active walking 4-5 miles/day and doing all her own yard work. She was started on Xarelto. Echo showed normal LV function. Mild biatrial enlargement. Mild to moderate MR with calcified valve and annulus.   On follow up today she continues to feel very well without limitation. No SOB, chest pain or fatigue. She remains very active. BP at home is well controlled. Walking 4 miles/day. No bleeding,   Past Medical History:  Diagnosis Date   Hyperlipidemia    Hypertension    Vitamin D deficiency     History reviewed. No pertinent surgical history.   Current Outpatient Medications  Medication Sig Dispense Refill   benazepril (LOTENSIN) 20 MG tablet Take 20 mg by mouth daily.     hydrochlorothiazide (HYDRODIURIL) 25 MG tablet Take 25 mg by mouth daily.     niacin-simvastatin (SIMCOR) 750-20 MG 24 hr tablet Take 0.5 tablets by mouth at bedtime.     Omega-3 Fatty Acids (FISH OIL PO) Take by mouth.     Vitamin D, Ergocalciferol, 2000 units CAPS Take 1 capsule by mouth daily.     Rivaroxaban (XARELTO) 15 MG TABS tablet Take 1 tablet (15 mg total) by mouth daily. 90 tablet 3   No current facility-administered medications for this visit.    Allergies:   Patient has no known allergies.    Social  History:  The patient  reports that she has never smoked. She has never used smokeless tobacco. She reports that she does not drink alcohol and does not use drugs.   Family History:  The patient's family history includes Cancer in her brother; Pancreatic cancer in her father.    ROS:  Please see the history of present illness.   Otherwise, review of systems are positive for none.   All other systems are reviewed and negative.    PHYSICAL EXAM: VS:  BP (!) 150/82    Pulse 84    Ht 5\' 7"  (1.702 m)    Wt 135 lb 12.8 oz (61.6 kg)    SpO2 100%    BMI 21.27 kg/m  , BMI Body mass index is 21.27 kg/m. GEN: Well nourished, well developed, in no acute distress  HEENT: normal  Neck: no JVD, carotid bruits, or masses Cardiac: IRRR; there is a grade 1-9/4 early diastolic murmur heard at the RUSB to LSB, no rubs or gallops,no edema  Respiratory:  clear to auscultation bilaterally, normal work of breathing GI: soft, nontender, nondistended, + BS MS: no deformity or atrophy  Skin: warm and dry, no rash Neuro:  Strength and sensation are intact Psych: euthymic mood, full affect   EKG:  EKG is ordered today. Afib rate 84. Occ.  PVC. Otherwise normal. I have personally reviewed and interpreted this study.     Recent Labs: No results found for requested labs within last 8760 hours.    Lipid Panel No results found for: CHOL, TRIG, HDL, CHOLHDL, VLDL, LDLCALC, LDLDIRECT    Wt Readings from Last 3 Encounters:  02/12/22 135 lb 12.8 oz (61.6 kg)  01/25/21 130 lb (59 kg)  07/11/20 129 lb 6.4 oz (58.7 kg)      Other studies Reviewed: Additional studies/ records that were reviewed today include: Labs dated 03/27/17: Cholesterol 124, triglycerides 106, HDL 49, LDL 54. Normal CMET and Hgb.  Dated 10/07/19: cholesterol 141, triglycerides 90, HDL 52, LDL 71. CBC, BMET, AST normal.  Dated 04/06/20: cholesterol 135, triglycerides 73, HDL 49, LDL 71. CBC and CMET normal.  Dated 07/05/20: A1c 6.2%.  Dated  10/24/20: A1c 6.1%. cholesterol 132, triglycerides 98, HDL 47, LDL 66. CMET and CBC normal. Dated 11/06/21: cholesterol 143, triglycerides 87, HDL 52, LDL 74. A1c 6%. CMET and CBC normal.  Echo 10/14/19:  IMPRESSIONS      1. Left ventricular ejection fraction, by visual estimation, is 55 to 60%. The left ventricle has normal function. There is no left ventricular hypertrophy.  2. Global right ventricle has normal systolic function.The right ventricular size is normal. No increase in right ventricular wall thickness.  3. Left atrial size was mildly dilated.  4. Right atrial size was mildly dilated.  5. Moderate calcification of the mitral valve leaflet(s).  6. Moderate mitral annular calcification.  7. Moderate thickening of the mitral valve leaflet(s).  8. The mitral valve is degenerative. Mild to moderate mitral valve regurgitation.  9. MR not well interrogated Restricted posterior leaflet motion with posteriorly directed jet can consider TEE to further evaluate. 10. The tricuspid valve is normal in structure. Tricuspid valve regurgitation moderate. 11. The aortic valve is tricuspid Aortic valve regurgitation is mild to moderate by color flow Doppler. 12. There is Moderate calcification of the aortic valve. 13. There is Moderate thickening of the aortic valve. 14. The pulmonic valve was grossly normal. Pulmonic valve regurgitation is mild by color flow Doppler. 15. Aortic dilatation noted. 16. There is mild dilatation of the aortic root measuring 38 mm. 17. Moderately elevated pulmonary artery systolic pressure.  ASSESSMENT AND PLAN:  1. Atrial fibrillation.  Rate is controlled on no medication and she is  asymptomatic. [place] vasc of 4.  On Xarelto 15 mg daily.  Continue current therapy. Labs OK in November. 2. Mild to moderate mitral insufficiency. 3. HTN  well controlled.  4. HLD well controlled on current therapy    Current medicines are reviewed at length with the patient today.   The patient does not have concerns regarding medicines.  The following changes have been made:  no change  Labs/ tests ordered today include:   Orders Placed This Encounter  Procedures   EKG 12-Lead     Disposition:   FU one year  Signed, Kinya Meine [place], MD  02/12/2022 2:09 PM    [facility] [address] [place], [place], [address] Phone [phone], Fax [phone]

## 2022-02-11 ENCOUNTER — Ambulatory Visit: Payer: Medicare Other | Admitting: Cardiology

## 2022-02-12 ENCOUNTER — Encounter: Payer: Self-pay | Admitting: Cardiology

## 2022-02-12 ENCOUNTER — Ambulatory Visit (INDEPENDENT_AMBULATORY_CARE_PROVIDER_SITE_OTHER): Payer: Medicare Other | Admitting: Cardiology

## 2022-02-12 ENCOUNTER — Other Ambulatory Visit: Payer: Self-pay

## 2022-02-12 VITALS — BP 150/82 | HR 84 | Ht 67.0 in | Wt 135.8 lb

## 2022-02-12 DIAGNOSIS — I4819 Other persistent atrial fibrillation: Secondary | ICD-10-CM | POA: Diagnosis not present

## 2022-02-12 DIAGNOSIS — I10 Essential (primary) hypertension: Secondary | ICD-10-CM | POA: Diagnosis not present

## 2022-02-12 MED ORDER — RIVAROXABAN 15 MG PO TABS: 15.0000 mg | ORAL_TABLET | Freq: Every day | ORAL | 3 refills | Status: DC

## 2022-05-20 ENCOUNTER — Other Ambulatory Visit: Payer: Self-pay | Admitting: Cardiology

## 2022-05-21 NOTE — Telephone Encounter (Signed)
Prescription refill request for Xarelto received.  Indication:Afib Last office visit:2/23 Weight:61.6 kg Age:88 Scr:0.8 CrCl:50 ml/min  Prescription refilled

## 2022-09-03 DIAGNOSIS — H353131 Nonexudative age-related macular degeneration, bilateral, early dry stage: Secondary | ICD-10-CM | POA: Diagnosis not present

## 2022-09-03 DIAGNOSIS — Z961 Presence of intraocular lens: Secondary | ICD-10-CM | POA: Diagnosis not present

## 2022-09-03 DIAGNOSIS — H35033 Hypertensive retinopathy, bilateral: Secondary | ICD-10-CM | POA: Diagnosis not present

## 2022-09-17 DIAGNOSIS — Z23 Encounter for immunization: Secondary | ICD-10-CM | POA: Diagnosis not present

## 2022-11-13 DIAGNOSIS — Z6820 Body mass index (BMI) 20.0-20.9, adult: Secondary | ICD-10-CM | POA: Diagnosis not present

## 2022-11-13 DIAGNOSIS — Z0000 Encounter for general adult medical examination without abnormal findings: Secondary | ICD-10-CM | POA: Diagnosis not present

## 2022-11-13 DIAGNOSIS — I4891 Unspecified atrial fibrillation: Secondary | ICD-10-CM | POA: Diagnosis not present

## 2022-11-13 DIAGNOSIS — E785 Hyperlipidemia, unspecified: Secondary | ICD-10-CM | POA: Diagnosis not present

## 2022-11-13 DIAGNOSIS — Z23 Encounter for immunization: Secondary | ICD-10-CM | POA: Diagnosis not present

## 2022-11-13 DIAGNOSIS — I10 Essential (primary) hypertension: Secondary | ICD-10-CM | POA: Diagnosis not present

## 2022-11-13 DIAGNOSIS — R7303 Prediabetes: Secondary | ICD-10-CM | POA: Diagnosis not present

## 2022-11-13 DIAGNOSIS — D6869 Other thrombophilia: Secondary | ICD-10-CM | POA: Diagnosis not present

## 2023-01-09 ENCOUNTER — Other Ambulatory Visit: Payer: Self-pay | Admitting: Cardiology

## 2023-01-09 NOTE — Telephone Encounter (Signed)
Prescription refill request for Xarelto received.  Indication: AF Last office visit: 02/12/22  P Martin[place] MD Weight: 61.6kg Age: 89 Scr: 0.79 on 11/13/22 CrCl: 49.71  Based on above findings Xarelto '15mg'$  daily is the appropriate dose.  Refill approved.

## 2023-02-07 NOTE — Progress Notes (Deleted)
Cardiology Office Note   Date:  02/07/2023   ID:  Wendy [name], DOB 06/11/1934, MRN [id number]  PCP:  Kathyrn Lass, MD  Cardiologist:   Amere Bricco [place], MD   No chief complaint on file.     History of Present Illness: Wendy [name] is a 89 y.o. female who is seen for follow up evaluation of atrial fibrillation. She has a history of PACs noted on Ecg in 2016 and 2017. She has a history of HTN and HLD.   Seen in October 2020  she was found to be in Afib with controlled rate. This was part of a routine physcical.  She was asymptomatic.  She  denies any palpitations, dizziness, syncope, dyspnea, or chest pain. BP has been well controlled. She is very active walking 4-5 miles/day and doing all her own yard work. She was started on Xarelto. Echo showed normal LV function. Mild biatrial enlargement. Mild to moderate MR with calcified valve and annulus.   On follow up today she continues to feel very well without limitation. No SOB, chest pain or fatigue. She remains very active. BP at home is well controlled. Walking 4 miles/day. No bleeding,   Past Medical History:  Diagnosis Date   Hyperlipidemia    Hypertension    Vitamin D deficiency     No past surgical history on file.   Current Outpatient Medications  Medication Sig Dispense Refill   benazepril (LOTENSIN) 20 MG tablet Take 20 mg by mouth daily.     hydrochlorothiazide (HYDRODIURIL) 25 MG tablet Take 25 mg by mouth daily.     niacin-simvastatin (SIMCOR) 750-20 MG 24 hr tablet Take 0.5 tablets by mouth at bedtime.     Omega-3 Fatty Acids (FISH OIL PO) Take by mouth.     Rivaroxaban (XARELTO) 15 MG TABS tablet Take 1 tablet by mouth once daily 90 tablet 1   Vitamin D, Ergocalciferol, 2000 units CAPS Take 1 capsule by mouth daily.     No current facility-administered medications for this visit.    Allergies:   Patient has no known allergies.    Social History:  The patient  reports that she has never smoked. She has  never used smokeless tobacco. She reports that she does not drink alcohol and does not use drugs.   Family History:  The patient's family history includes Cancer in her brother; Pancreatic cancer in her father.    ROS:  Please see the history of present illness.   Otherwise, review of systems are positive for none.   All other systems are reviewed and negative.    PHYSICAL EXAM: VS:  There were no vitals taken for this visit. , BMI There is no height or weight on file to calculate BMI. GEN: Well nourished, well developed, in no acute distress  HEENT: normal  Neck: no JVD, carotid bruits, or masses Cardiac: IRRR; there is a grade [id number] early diastolic murmur heard at the RUSB to LSB, no rubs or gallops,no edema  Respiratory:  clear to auscultation bilaterally, normal work of breathing GI: soft, nontender, nondistended, + BS MS: no deformity or atrophy  Skin: warm and dry, no rash Neuro:  Strength and sensation are intact Psych: euthymic mood, full affect   EKG:  EKG is ordered today. Afib rate 84. Occ. PVC. Otherwise normal. I have personally reviewed and interpreted this study.     Recent Labs: No results found for requested labs within last 365 days.    Lipid Panel No  results found for: "CHOL", "TRIG", "HDL", "CHOLHDL", "VLDL", "LDLCALC", "LDLDIRECT"    Wt Readings from Last 3 Encounters:  02/12/22 135 lb 12.8 oz (61.6 kg)  01/25/21 130 lb (59 kg)  07/11/20 129 lb 6.4 oz (58.7 kg)      Other studies Reviewed: Additional studies/ records that were reviewed today include: Labs dated 03/27/17: Cholesterol 124, triglycerides 106, HDL 49, LDL 54. Normal CMET and Hgb.  Dated 10/07/19: cholesterol 141, triglycerides 90, HDL 52, LDL 71. CBC, BMET, AST normal.  Dated 04/06/20: cholesterol 135, triglycerides 73, HDL 49, LDL 71. CBC and CMET normal.  Dated 07/05/20: A1c 6.2%.  Dated 10/24/20: A1c 6.1%. cholesterol 132, triglycerides 98, HDL 47, LDL 66. CMET and CBC normal. Dated  11/06/21: cholesterol 143, triglycerides 87, HDL 52, LDL 74. A1c 6%. CMET and CBC normal. Dated 11/13/22: cholesterol 137, triglycerides 88, HDL 55, LDL 65. A1c 6.3%. CBC and CMET normal   Echo 10/14/19:  IMPRESSIONS      1. Left ventricular ejection fraction, by visual estimation, is 55 to 60%. The left ventricle has normal function. There is no left ventricular hypertrophy.  2. Global right ventricle has normal systolic function.The right ventricular size is normal. No increase in right ventricular wall thickness.  3. Left atrial size was mildly dilated.  4. Right atrial size was mildly dilated.  5. Moderate calcification of the mitral valve leaflet(s).  6. Moderate mitral annular calcification.  7. Moderate thickening of the mitral valve leaflet(s).  8. The mitral valve is degenerative. Mild to moderate mitral valve regurgitation.  9. MR not well interrogated Restricted posterior leaflet motion with posteriorly directed jet can consider TEE to further evaluate. 10. The tricuspid valve is normal in structure. Tricuspid valve regurgitation moderate. 11. The aortic valve is tricuspid Aortic valve regurgitation is mild to moderate by color flow Doppler. 12. There is Moderate calcification of the aortic valve. 13. There is Moderate thickening of the aortic valve. 14. The pulmonic valve was grossly normal. Pulmonic valve regurgitation is mild by color flow Doppler. 15. Aortic dilatation noted. 16. There is mild dilatation of the aortic root measuring 38 mm. 17. Moderately elevated pulmonary artery systolic pressure.  ASSESSMENT AND PLAN:  1. Atrial fibrillation.  Rate is controlled on no medication and she is  asymptomatic. [place] vasc of 4.  On Xarelto 15 mg daily.  Continue current therapy. Labs OK in November. 2. Mild to moderate mitral insufficiency. 3. HTN  well controlled.  4. HLD well controlled on current therapy    Current medicines are reviewed at length with the patient today.   The patient does not have concerns regarding medicines.  The following changes have been made:  no change  Labs/ tests ordered today include:   No orders of the defined types were placed in this encounter.    Disposition:   FU one year  Signed, Horace Lukas [place], MD  02/07/2023 12:35 PM    [facility] [address] [place], [place], [address] Phone [phone], Fax [phone]

## 2023-02-13 ENCOUNTER — Ambulatory Visit: Payer: Medicare Other | Admitting: Cardiology

## 2023-05-28 DIAGNOSIS — R350 Frequency of micturition: Secondary | ICD-10-CM | POA: Diagnosis not present

## 2023-05-28 DIAGNOSIS — N390 Urinary tract infection, site not specified: Secondary | ICD-10-CM | POA: Diagnosis not present

## 2023-06-19 DIAGNOSIS — N390 Urinary tract infection, site not specified: Secondary | ICD-10-CM | POA: Diagnosis not present

## 2023-06-19 DIAGNOSIS — Z6820 Body mass index (BMI) 20.0-20.9, adult: Secondary | ICD-10-CM | POA: Diagnosis not present

## 2023-06-19 DIAGNOSIS — R350 Frequency of micturition: Secondary | ICD-10-CM | POA: Diagnosis not present

## 2023-06-27 DIAGNOSIS — R300 Dysuria: Secondary | ICD-10-CM | POA: Diagnosis not present

## 2023-06-30 DIAGNOSIS — C801 Malignant (primary) neoplasm, unspecified: Secondary | ICD-10-CM

## 2023-06-30 HISTORY — DX: Malignant (primary) neoplasm, unspecified: C80.1

## 2023-07-04 DIAGNOSIS — N898 Other specified noninflammatory disorders of vagina: Secondary | ICD-10-CM | POA: Diagnosis not present

## 2023-07-04 DIAGNOSIS — N952 Postmenopausal atrophic vaginitis: Secondary | ICD-10-CM | POA: Diagnosis not present

## 2023-07-04 DIAGNOSIS — R300 Dysuria: Secondary | ICD-10-CM | POA: Diagnosis not present

## 2023-07-07 ENCOUNTER — Encounter (HOSPITAL_COMMUNITY): Payer: Self-pay | Admitting: Emergency Medicine

## 2023-07-07 ENCOUNTER — Emergency Department (HOSPITAL_COMMUNITY): Payer: Medicare Other

## 2023-07-07 ENCOUNTER — Other Ambulatory Visit: Payer: Self-pay

## 2023-07-07 ENCOUNTER — Inpatient Hospital Stay (HOSPITAL_COMMUNITY)
Admission: EM | Admit: 2023-07-07 | Discharge: 2023-07-12 | DRG: 436 | Disposition: A | Discharge status: Home / Self Care | Payer: Medicare Other | Attending: Internal Medicine | Admitting: Internal Medicine

## 2023-07-07 DIAGNOSIS — K8689 Other specified diseases of pancreas: Secondary | ICD-10-CM

## 2023-07-07 DIAGNOSIS — C170 Malignant neoplasm of duodenum: Secondary | ICD-10-CM | POA: Diagnosis not present

## 2023-07-07 DIAGNOSIS — Z681 Body mass index (BMI) 19 or less, adult: Secondary | ICD-10-CM | POA: Diagnosis not present

## 2023-07-07 DIAGNOSIS — Z79899 Other long term (current) drug therapy: Secondary | ICD-10-CM | POA: Diagnosis not present

## 2023-07-07 DIAGNOSIS — R933 Abnormal findings on diagnostic imaging of other parts of digestive tract: Secondary | ICD-10-CM | POA: Diagnosis not present

## 2023-07-07 DIAGNOSIS — K319 Disease of stomach and duodenum, unspecified: Secondary | ICD-10-CM | POA: Diagnosis not present

## 2023-07-07 DIAGNOSIS — E440 Moderate protein-calorie malnutrition: Secondary | ICD-10-CM | POA: Diagnosis present

## 2023-07-07 DIAGNOSIS — I4891 Unspecified atrial fibrillation: Secondary | ICD-10-CM | POA: Diagnosis present

## 2023-07-07 DIAGNOSIS — E611 Iron deficiency: Secondary | ICD-10-CM | POA: Diagnosis not present

## 2023-07-07 DIAGNOSIS — K760 Fatty (change of) liver, not elsewhere classified: Secondary | ICD-10-CM | POA: Diagnosis not present

## 2023-07-07 DIAGNOSIS — Z1152 Encounter for screening for COVID-19: Secondary | ICD-10-CM

## 2023-07-07 DIAGNOSIS — D509 Iron deficiency anemia, unspecified: Secondary | ICD-10-CM | POA: Diagnosis present

## 2023-07-07 DIAGNOSIS — Z800 Family history of malignant neoplasm of digestive organs: Secondary | ICD-10-CM

## 2023-07-07 DIAGNOSIS — I517 Cardiomegaly: Secondary | ICD-10-CM | POA: Diagnosis not present

## 2023-07-07 DIAGNOSIS — E559 Vitamin D deficiency, unspecified: Secondary | ICD-10-CM | POA: Diagnosis present

## 2023-07-07 DIAGNOSIS — E538 Deficiency of other specified B group vitamins: Secondary | ICD-10-CM | POA: Diagnosis not present

## 2023-07-07 DIAGNOSIS — E785 Hyperlipidemia, unspecified: Secondary | ICD-10-CM | POA: Diagnosis present

## 2023-07-07 DIAGNOSIS — C259 Malignant neoplasm of pancreas, unspecified: Secondary | ICD-10-CM | POA: Diagnosis not present

## 2023-07-07 DIAGNOSIS — R0609 Other forms of dyspnea: Secondary | ICD-10-CM | POA: Diagnosis not present

## 2023-07-07 DIAGNOSIS — E876 Hypokalemia: Secondary | ICD-10-CM | POA: Diagnosis present

## 2023-07-07 DIAGNOSIS — E871 Hypo-osmolality and hyponatremia: Secondary | ICD-10-CM | POA: Diagnosis present

## 2023-07-07 DIAGNOSIS — N200 Calculus of kidney: Secondary | ICD-10-CM | POA: Diagnosis not present

## 2023-07-07 DIAGNOSIS — D500 Iron deficiency anemia secondary to blood loss (chronic): Secondary | ICD-10-CM | POA: Diagnosis not present

## 2023-07-07 DIAGNOSIS — Z7901 Long term (current) use of anticoagulants: Secondary | ICD-10-CM

## 2023-07-07 DIAGNOSIS — D490 Neoplasm of unspecified behavior of digestive system: Secondary | ICD-10-CM | POA: Diagnosis not present

## 2023-07-07 DIAGNOSIS — T182XXA Foreign body in stomach, initial encounter: Secondary | ICD-10-CM | POA: Diagnosis not present

## 2023-07-07 DIAGNOSIS — K315 Obstruction of duodenum: Secondary | ICD-10-CM | POA: Diagnosis not present

## 2023-07-07 DIAGNOSIS — C250 Malignant neoplasm of head of pancreas: Principal | ICD-10-CM | POA: Diagnosis present

## 2023-07-07 DIAGNOSIS — Z882 Allergy status to sulfonamides status: Secondary | ICD-10-CM | POA: Diagnosis not present

## 2023-07-07 DIAGNOSIS — D649 Anemia, unspecified: Secondary | ICD-10-CM | POA: Diagnosis not present

## 2023-07-07 DIAGNOSIS — I700 Atherosclerosis of aorta: Secondary | ICD-10-CM | POA: Diagnosis not present

## 2023-07-07 DIAGNOSIS — C784 Secondary malignant neoplasm of small intestine: Secondary | ICD-10-CM | POA: Diagnosis present

## 2023-07-07 DIAGNOSIS — I10 Essential (primary) hypertension: Secondary | ICD-10-CM | POA: Diagnosis present

## 2023-07-07 DIAGNOSIS — I272 Pulmonary hypertension, unspecified: Secondary | ICD-10-CM | POA: Diagnosis not present

## 2023-07-07 DIAGNOSIS — R918 Other nonspecific abnormal finding of lung field: Secondary | ICD-10-CM | POA: Diagnosis not present

## 2023-07-07 DIAGNOSIS — Z809 Family history of malignant neoplasm, unspecified: Secondary | ICD-10-CM | POA: Diagnosis not present

## 2023-07-07 DIAGNOSIS — K5900 Constipation, unspecified: Secondary | ICD-10-CM | POA: Diagnosis present

## 2023-07-07 DIAGNOSIS — N281 Cyst of kidney, acquired: Secondary | ICD-10-CM | POA: Diagnosis not present

## 2023-07-07 DIAGNOSIS — R0602 Shortness of breath: Secondary | ICD-10-CM | POA: Diagnosis not present

## 2023-07-07 DIAGNOSIS — R531 Weakness: Principal | ICD-10-CM

## 2023-07-07 DIAGNOSIS — K3189 Other diseases of stomach and duodenum: Secondary | ICD-10-CM | POA: Diagnosis not present

## 2023-07-07 LAB — CBC WITH DIFFERENTIAL/PLATELET
Abs Immature Granulocytes: 0.01 10*3/uL (ref 0.00–0.07)
Basophils Absolute: 0.1 10*3/uL (ref 0.0–0.1)
Basophils Relative: 1 %
Eosinophils Absolute: 0 10*3/uL (ref 0.0–0.5)
Eosinophils Relative: 1 %
HCT: 22.2 % — ABNORMAL LOW (ref 36.0–46.0)
Hemoglobin: 7 g/dL — ABNORMAL LOW (ref 12.0–15.0)
Immature Granulocytes: 0 %
Lymphocytes Relative: 20 %
Lymphs Abs: 1 10*3/uL (ref 0.7–4.0)
MCH: 31 pg (ref 26.0–34.0)
MCHC: 31.5 g/dL (ref 30.0–36.0)
MCV: 98.2 fL (ref 80.0–100.0)
Monocytes Absolute: 0.5 10*3/uL (ref 0.1–1.0)
Monocytes Relative: 10 %
Neutro Abs: 3.5 10*3/uL (ref 1.7–7.7)
Neutrophils Relative %: 68 %
Platelets: 283 10*3/uL (ref 150–400)
RBC: 2.26 MIL/uL — ABNORMAL LOW (ref 3.87–5.11)
RDW: 13.3 % (ref 11.5–15.5)
WBC: 5.2 10*3/uL (ref 4.0–10.5)
nRBC: 0 % (ref 0.0–0.2)

## 2023-07-07 LAB — COMPREHENSIVE METABOLIC PANEL
ALT: 18 U/L (ref 0–44)
AST: 26 U/L (ref 15–41)
Albumin: 3.8 g/dL (ref 3.5–5.0)
Alkaline Phosphatase: 52 U/L (ref 38–126)
Anion gap: 9 (ref 5–15)
BUN: 33 mg/dL — ABNORMAL HIGH (ref 8–23)
CO2: 29 mmol/L (ref 22–32)
Calcium: 10.7 mg/dL — ABNORMAL HIGH (ref 8.9–10.3)
Chloride: 97 mmol/L — ABNORMAL LOW (ref 98–111)
Creatinine, Ser: 0.91 mg/dL (ref 0.44–1.00)
GFR, Estimated: >60 mL/min (ref >60)
Glucose, Bld: 141 mg/dL — ABNORMAL HIGH (ref 70–99)
Potassium: 2.8 mmol/L — ABNORMAL LOW (ref 3.5–5.1)
Sodium: 135 mmol/L (ref 135–145)
Total Bilirubin: 0.5 mg/dL (ref 0.3–1.2)
Total Protein: 6.6 g/dL (ref 6.5–8.1)

## 2023-07-07 LAB — FERRITIN: Ferritin: 10 ng/mL — ABNORMAL LOW (ref 11–307)

## 2023-07-07 LAB — IRON AND TIBC
Iron: 16 ug/dL — ABNORMAL LOW (ref 28–170)
Saturation Ratios: 5 % — ABNORMAL LOW (ref 10.4–31.8)
TIBC: 316 ug/dL (ref 250–450)
UIBC: 300 ug/dL

## 2023-07-07 LAB — URINALYSIS, W/ REFLEX TO CULTURE (INFECTION SUSPECTED)
Bacteria, UA: NONE SEEN
Bilirubin Urine: NEGATIVE
Glucose, UA: NEGATIVE mg/dL
Hgb urine dipstick: NEGATIVE
Ketones, ur: NEGATIVE mg/dL
Nitrite: NEGATIVE
Protein, ur: NEGATIVE mg/dL
Specific Gravity, Urine: 1.029 (ref 1.005–1.030)
pH: 5 (ref 5.0–8.0)

## 2023-07-07 LAB — PREPARE RBC (CROSSMATCH)

## 2023-07-07 LAB — POC OCCULT BLOOD, ED: Fecal Occult Bld: POSITIVE — AB

## 2023-07-07 LAB — TROPONIN I (HIGH SENSITIVITY)
Troponin I (High Sensitivity): 13 ng/L (ref <18)
Troponin I (High Sensitivity): 12 ng/L (ref <18)

## 2023-07-07 LAB — RETICULOCYTES
Immature Retic Fract: 21 % — ABNORMAL HIGH (ref 2.3–15.9)
RBC.: 2.17 MIL/uL — ABNORMAL LOW (ref 3.87–5.11)
Retic Count, Absolute: 82 10*3/uL (ref 19.0–186.0)
Retic Ct Pct: 3.8 % — ABNORMAL HIGH (ref 0.4–3.1)

## 2023-07-07 LAB — BPAM RBC
Blood Product Expiration Date: 202408052359
ISSUE DATE / TIME: 202407081726

## 2023-07-07 LAB — VITAMIN B12: Vitamin B-12: 150 pg/mL — ABNORMAL LOW (ref 180–914)

## 2023-07-07 LAB — TYPE AND SCREEN

## 2023-07-07 LAB — SARS CORONAVIRUS 2 BY RT PCR: SARS Coronavirus 2 by RT PCR: NEGATIVE

## 2023-07-07 LAB — FOLATE: Folate: 8.9 ng/mL (ref >5.9)

## 2023-07-07 LAB — LIPASE, BLOOD: Lipase: 87 U/L — ABNORMAL HIGH (ref 11–51)

## 2023-07-07 LAB — ABO/RH: ABO/RH(D): A POS

## 2023-07-07 MED ORDER — IOHEXOL 300 MG/ML  SOLN
75.0000 mL | Freq: Once | INTRAMUSCULAR | Status: AC | PRN
  Administered 2023-07-07: 75 mL via INTRAVENOUS

## 2023-07-07 MED ORDER — ALBUTEROL SULFATE (2.5 MG/3ML) 0.083% IN NEBU: 2.5000 mg | INHALATION_SOLUTION | RESPIRATORY_TRACT | Status: DC | PRN

## 2023-07-07 MED ORDER — ACETAMINOPHEN 650 MG RE SUPP: 650.0000 mg | Freq: Four times a day (QID) | RECTAL | Status: DC | PRN

## 2023-07-07 MED ORDER — BENAZEPRIL HCL 20 MG PO TABS
20.0000 mg | ORAL_TABLET | Freq: Every day | ORAL | Status: DC
  Filled 2023-07-07: qty 1

## 2023-07-07 MED ORDER — POLYETHYLENE GLYCOL 3350 17 G PO PACK
17.0000 g | PACK | Freq: Every day | ORAL | Status: DC | PRN
  Administered 2023-07-08: 17 g via ORAL
  Filled 2023-07-07: qty 1

## 2023-07-07 MED ORDER — ACETAMINOPHEN 325 MG PO TABS
650.0000 mg | ORAL_TABLET | Freq: Four times a day (QID) | ORAL | Status: DC | PRN
  Administered 2023-07-08 – 2023-07-11 (×5): 650 mg via ORAL
  Filled 2023-07-07 (×5): qty 2

## 2023-07-07 MED ORDER — MELATONIN 5 MG PO TABS
5.0000 mg | ORAL_TABLET | Freq: Every evening | ORAL | Status: DC | PRN
  Administered 2023-07-07: 5 mg via ORAL
  Filled 2023-07-07: qty 1

## 2023-07-07 MED ORDER — MORPHINE SULFATE (PF) 2 MG/ML IV SOLN: 1.0000 mg | INTRAVENOUS | Status: DC | PRN

## 2023-07-07 MED ORDER — POTASSIUM CHLORIDE CRYS ER 20 MEQ PO TBCR
40.0000 meq | EXTENDED_RELEASE_TABLET | Freq: Once | ORAL | Status: AC
  Administered 2023-07-07: 40 meq via ORAL
  Filled 2023-07-07: qty 2

## 2023-07-07 MED ORDER — RIVAROXABAN 15 MG PO TABS: 15.0000 mg | ORAL_TABLET | Freq: Two times a day (BID) | ORAL | 0 refills | Status: DC

## 2023-07-07 MED ORDER — HYDROCHLOROTHIAZIDE 25 MG PO TABS: 25.0000 mg | ORAL_TABLET | Freq: Every day | ORAL | Status: DC

## 2023-07-07 MED ORDER — ONDANSETRON HCL 4 MG/2ML IJ SOLN
4.0000 mg | Freq: Four times a day (QID) | INTRAMUSCULAR | Status: DC | PRN
  Administered 2023-07-09: 4 mg via INTRAVENOUS
  Filled 2023-07-07: qty 2

## 2023-07-07 MED ORDER — SODIUM CHLORIDE 0.9 % IV BOLUS
500.0000 mL | Freq: Once | INTRAVENOUS | Status: AC
  Administered 2023-07-07: 500 mL via INTRAVENOUS

## 2023-07-07 MED ORDER — ENOXAPARIN SODIUM 40 MG/0.4ML IJ SOSY
40.0000 mg | PREFILLED_SYRINGE | INTRAMUSCULAR | Status: DC
  Administered 2023-07-07: 40 mg via SUBCUTANEOUS
  Filled 2023-07-07: qty 0.4

## 2023-07-07 MED ORDER — ONDANSETRON HCL 4 MG PO TABS
4.0000 mg | ORAL_TABLET | Freq: Four times a day (QID) | ORAL | Status: DC | PRN
  Administered 2023-07-09: 4 mg via ORAL
  Filled 2023-07-07: qty 1

## 2023-07-07 MED ORDER — SODIUM CHLORIDE 0.9% IV SOLUTION: Freq: Once | INTRAVENOUS | Status: DC

## 2023-07-07 MED ORDER — SODIUM CHLORIDE (PF) 0.9 % IJ SOLN
INTRAMUSCULAR | Status: AC
  Filled 2023-07-07: qty 50

## 2023-07-07 MED ORDER — DOCUSATE SODIUM 100 MG PO CAPS
100.0000 mg | ORAL_CAPSULE | Freq: Two times a day (BID) | ORAL | Status: DC
  Administered 2023-07-07 – 2023-07-12 (×10): 100 mg via ORAL
  Filled 2023-07-07 (×10): qty 1

## 2023-07-07 MED ORDER — POTASSIUM CHLORIDE 10 MEQ/100ML IV SOLN
10.0000 meq | INTRAVENOUS | Status: DC
  Administered 2023-07-07: 10 meq via INTRAVENOUS
  Filled 2023-07-07: qty 100

## 2023-07-07 NOTE — ED Notes (Signed)
ED TO INPATIENT HANDOFF REPORT  Name/Age/Gender Wendy [name] 89 y.o. female  Code Status    Code Status Orders  (From admission, onward)           Start     Ordered   07/07/23 1457  Full code  Continuous       Question:  By:  Answer:  Consent: discussion documented in EHR   07/07/23 1457           Code Status History     This patient has a current code status but no historical code status.       Home/SNF/Other Home  Chief Complaint Pancreatic mass [K86.89]  Level of Care/Admitting Diagnosis ED Disposition     ED Disposition  Admit   Condition  --   Comment  Hospital Area: [facility] [[id number]]  Level of Care: Med-Surg [16]  May place patient in observation at [facility] or [facility] if equivalent level of care is available:: Yes  Covid Evaluation: Asymptomatic - no recent exposure (last 10 days) testing not required  Diagnosis: Pancreatic mass [320391]  Admitting Physician: Maryln Gottron [[id number]]  Attending Physician: Kirby Crigler, MIR [email or web address] [[id number]]          Medical History Past Medical History:  Diagnosis Date   Hyperlipidemia    Hypertension    Vitamin D deficiency     Allergies No Known Allergies  IV Location/Drains/Wounds Patient Lines/Drains/Airways Status     Active Line/Drains/Airways     Name Placement date Placement time Site Days   Peripheral IV 07/07/23 20 G Left Antecubital 07/07/23  1123  Antecubital  less than 1            Labs/Imaging Results for orders placed or performed during the hospital encounter of 07/07/23 (from the past 48 hour(s))  Comprehensive metabolic panel     Status: Abnormal   Collection Time: 07/07/23 10:38 AM  Result Value Ref Range   Sodium 135 135 - 145 mmol/L   Potassium 2.8 (L) 3.5 - 5.1 mmol/L   Chloride 97 (L) 98 - 111 mmol/L   CO2 29 22 - 32 mmol/L   Glucose, Bld 141 (H) 70 - 99 mg/dL    Comment: Glucose reference range applies only to samples taken after  fasting for at least 8 hours.   BUN 33 (H) 8 - 23 mg/dL   Creatinine, Ser 6.21 0.44 - 1.00 mg/dL   Calcium 30.8 (H) 8.9 - 10.3 mg/dL   Total Protein 6.6 6.5 - 8.1 g/dL   Albumin 3.8 3.5 - 5.0 g/dL   AST 26 15 - 41 U/L   ALT 18 0 - 44 U/L   Alkaline Phosphatase 52 38 - 126 U/L   Total Bilirubin 0.5 0.3 - 1.2 mg/dL   GFR, Estimated >65 >78 mL/min    Comment: (NOTE) Calculated using the CKD-EPI Creatinine Equation (2021)    Anion gap 9 5 - 15    Comment: Performed at [facility], [address], [place], [place] [address]  Lipase, blood     Status: Abnormal   Collection Time: 07/07/23 10:38 AM  Result Value Ref Range   Lipase 87 (H) 11 - 51 U/L    Comment: Performed at [facility], [address] [place], [place] [address]  Troponin I (High Sensitivity)     Status: None   Collection Time: 07/07/23 10:38 AM  Result Value Ref Range   Troponin I (High Sensitivity) 13 <18  ng/L    Comment: (NOTE) Elevated high sensitivity troponin I (hsTnI) values and significant  changes across serial measurements may suggest ACS but many other  chronic and acute conditions are known to elevate hsTnI results.  Refer to the "Links" section for chest pain algorithms and additional  guidance. Performed at [facility], [address] [place], [place] [address]   CBC with Differential     Status: Abnormal   Collection Time: 07/07/23 10:38 AM  Result Value Ref Range   WBC 5.2 4.0 - 10.5 K/uL   RBC 2.26 (L) 3.87 - 5.11 MIL/uL   Hemoglobin 7.0 (L) 12.0 - 15.0 g/dL   HCT 11.9 (L) 14.7 - 82.9 %   MCV 98.2 80.0 - 100.0 fL   MCH 31.0 26.0 - 34.0 pg   MCHC 31.5 30.0 - 36.0 g/dL   RDW 56.2 13.0 - 86.5 %   Platelets 283 150 - 400 K/uL   nRBC 0.0 0.0 - 0.2 %   Neutrophils Relative % 68 %   Neutro Abs 3.5 1.7 - 7.7 K/uL   Lymphocytes Relative 20 %   Lymphs Abs 1.0 0.7 - 4.0 K/uL   Monocytes Relative 10 %   Monocytes Absolute 0.5 0.1 - 1.0 K/uL    Eosinophils Relative 1 %   Eosinophils Absolute 0.0 0.0 - 0.5 K/uL   Basophils Relative 1 %   Basophils Absolute 0.1 0.0 - 0.1 K/uL   Immature Granulocytes 0 %   Abs Immature Granulocytes 0.01 0.00 - 0.07 K/uL    Comment: Performed at [facility], [address] [place], [place] [address]  SARS Coronavirus 2 by RT PCR (hospital order, performed in [facility] hospital lab) *cepheid single result test* Anterior Nasal Swab     Status: None   Collection Time: 07/07/23 11:00 AM   Specimen: Anterior Nasal Swab  Result Value Ref Range   SARS Coronavirus 2 by RT PCR NEGATIVE NEGATIVE    Comment: (NOTE) SARS-CoV-2 target nucleic acids are NOT DETECTED.  The SARS-CoV-2 RNA is generally detectable in upper and lower respiratory specimens during the acute phase of infection. The lowest concentration of SARS-CoV-2 viral copies this assay can detect is 250 copies / mL. A negative result does not preclude SARS-CoV-2 infection and should not be used as the sole basis for treatment or other patient management decisions.  A negative result may occur with improper specimen collection / handling, submission of specimen other than nasopharyngeal swab, presence of viral mutation(s) within the areas targeted by this assay, and inadequate number of viral copies (<250 copies / mL). A negative result must be combined with clinical observations, patient history, and epidemiological information.  Fact Sheet for Patients:   RoadLapTop.co.za  Fact Sheet for Healthcare Providers: http://kim-miller.com/  This test is not yet approved or  cleared by the [place] FDA and has been authorized for detection and/or diagnosis of SARS-CoV-2 by FDA under an Emergency Use Authorization (EUA).  This EUA will remain in effect (meaning this test can be used) for the duration of the COVID-19 declaration under Section 564(b)(1) of the Act, 21  U.S.C. section 360bbb-3(b)(1), unless the authorization is terminated or revoked sooner.  Performed at [facility], [address], [place], [place] [address]   Troponin I (High Sensitivity)     Status: None   Collection Time: 07/07/23 12:46 PM  Result Value Ref Range   Troponin I (High Sensitivity) 12 <18 ng/L    Comment: (NOTE)  Elevated high sensitivity troponin I (hsTnI) values and significant  changes across serial measurements may suggest ACS but many other  chronic and acute conditions are known to elevate hsTnI results.  Refer to the "Links" section for chest pain algorithms and additional  guidance. Performed at [facility], [address] [place], [place] [address]   Vitamin B12     Status: Abnormal   Collection Time: 07/07/23 12:46 PM  Result Value Ref Range   Vitamin B-12 150 (L) 180 - 914 pg/mL    Comment: (NOTE) This assay is not validated for testing neonatal or myeloproliferative syndrome specimens for Vitamin B12 levels. Performed at [facility], [address], [place], [place] [address]   Folate     Status: None   Collection Time: 07/07/23 12:46 PM  Result Value Ref Range   Folate 8.9 >5.9 ng/mL    Comment: Performed at [facility], [address], [place], [place] [address]  Iron and TIBC     Status: Abnormal   Collection Time: 07/07/23 12:46 PM  Result Value Ref Range   Iron 16 (L) 28 - 170 ug/dL   TIBC 440 102 - 725 ug/dL   Saturation Ratios 5 (L) 10.4 - 31.8 %   UIBC 300 ug/dL    Comment: Performed at [facility], [address], [place], [place] [address]  Ferritin     Status: Abnormal   Collection Time: 07/07/23 12:46 PM  Result Value Ref Range   Ferritin 10 (L) 11 - 307 ng/mL    Comment: Performed at [facility], [address] [place], [place] [address]  Reticulocytes     Status: Abnormal   Collection Time: 07/07/23 12:46  PM  Result Value Ref Range   Retic Ct Pct 3.8 (H) 0.4 - 3.1 %   RBC. 2.17 (L) 3.87 - 5.11 MIL/uL   Retic Count, Absolute 82.0 19.0 - 186.0 K/uL   Immature Retic Fract 21.0 (H) 2.3 - 15.9 %    Comment: Performed at [facility], [address], [place], [place] [address]  Type and screen [facility]     Status: None   Collection Time: 07/07/23 12:46 PM  Result Value Ref Range   ABO/RH(D) A POS    Antibody Screen NEG    Sample Expiration      07/10/2023,2359 Performed at [facility], [address] [place], [place] [address]   Urinalysis, w/ Reflex to Culture (Infection Suspected) -Urine, Clean Catch     Status: Abnormal   Collection Time: 07/07/23  2:15 PM  Result Value Ref Range   Specimen Source URINE, CLEAN CATCH    Color, Urine YELLOW YELLOW   APPearance CLEAR CLEAR   Specific Gravity, Urine 1.029 1.005 - 1.030   pH 5.0 5.0 - 8.0   Glucose, UA NEGATIVE NEGATIVE mg/dL   Hgb urine dipstick NEGATIVE NEGATIVE   Bilirubin Urine NEGATIVE NEGATIVE   Ketones, ur NEGATIVE NEGATIVE mg/dL   Protein, ur NEGATIVE NEGATIVE mg/dL   Nitrite NEGATIVE NEGATIVE   Leukocytes,Ua SMALL (A) NEGATIVE   RBC / HPF 0-5 0 - 5 RBC/hpf   WBC, UA 0-5 0 - 5 WBC/hpf    Comment:        Reflex urine culture not performed if WBC <=10, OR if Squamous epithelial cells >5. If Squamous epithelial cells >5 suggest recollection.    Bacteria, UA NONE SEEN NONE SEEN   Squamous Epithelial /  HPF 0-5 0 - 5 /HPF   Mucus PRESENT    Hyaline Casts, UA PRESENT     Comment: Performed at [facility], [address], [place], [place] [address]  POC occult blood, ED     Status: Abnormal   Collection Time: 07/07/23  2:47 PM  Result Value Ref Range   Fecal Occult Bld POSITIVE (A) NEGATIVE   CT ABDOMEN PELVIS W CONTRAST  Result Date: 07/07/2023 CLINICAL DATA:  Abdominal pain, acute, nonlocalized EXAM: CT ABDOMEN AND PELVIS WITH CONTRAST  TECHNIQUE: Multidetector CT imaging of the abdomen and pelvis was performed using the standard protocol following bolus administration of intravenous contrast. RADIATION DOSE REDUCTION: This exam was performed according to the departmental dose-optimization program which includes automated exposure control, adjustment of the mA and/or kV according to patient size and/or use of iterative reconstruction technique. CONTRAST:  75mL OMNIPAQUE IOHEXOL 300 MG/ML  SOLN COMPARISON:  None Available. FINDINGS: Lower chest: Visualized lung bases are clear. Mild cardiomegaly. No acute abnormality. Hepatobiliary: Mild hepatic steatosis. No enhancing intrahepatic mass. No intra or extrahepatic biliary ductal dilation. Gallbladder unremarkable. Pancreas: There is a heterogeneously enhancing expansile mass within the head of the pancreas measuring at least 3.9 x 5.3 by 3.4 cm in dimension, situated within the pancreatico duodenal groove likely invading the second portion of the duodenum, best seen on image # 30/2, and abutting the terminal superior mesenteric vein without invasion or encasement, best seen on image # 30-32/2 and coronal image # 44/8. The main pancreatic duct is deviated superiorly, as is the distal common duct, but does not appear dilated. Focal pancreatitis is a consideration though is considered less likely given the lack of significant surrounding inflammatory stranding and apparent invasion of the second portion of the duodenum. Single enlarged periportal lymph node is seen at axial image # 24/2 measuring 11 mm in short axis diameter. Normal enhancement and preserved parenchyma involving of the body and tail the pancreas. No peripancreatic fluid collections. Spleen: Unremarkable Adrenals/Urinary Tract: The adrenal glands are unremarkable. The kidneys are normal in size and position. Punctate 1-2 mm nonobstructing calculi are seen within the kidneys bilaterally. No ureteral calculi. No hydronephrosis. No enhancing  intrarenal masses. Simple cortical cyst within the interpolar region of the left kidney for which no follow-up imaging is recommended. The bladder is unremarkable. Stomach/Bowel: The stomach and proximal duodenum appears fluid-filled and distended suggesting at least partial obstruction related to the mass involving the second portion of the duodenum. The stomach, small bowel, and large bowel are otherwise unremarkable. The appendix is normal. No free intraperitoneal gas or fluid. Vascular/Lymphatic: Moderate aortoiliac atherosclerotic calcification. No aortic aneurysm. Superior mesenteric artery and common hepatic artery are separate from the above-mentioned pancreatic mass. No additional pathologic adenopathy within the abdomen and pelvis. Reproductive: Uterus and bilateral adnexa are unremarkable. Other: No abdominal wall hernia Musculoskeletal: Degenerative changes are seen within the lumbar spine and hips bilaterally. No acute bone abnormality. No lytic or blastic bone lesion. IMPRESSION: 1. 5.3 cm heterogeneously enhancing expansile mass within the head of the pancreas, situated within the pancreatico duodenal groove likely invading the second portion of the duodenum and abutting the terminal superior mesenteric vein without invasion or encasement, and deviation of the main biliary and pancreatic ductal structures. Findings are concerning for pancreatic adenocarcinoma. While focal pancreatitis is a consideration, this is considered less likely given the evidence of mass effect and mural invasion. Correlation with CA 19-9 level as well as endoscopic ultrasound and tissue sampling is  recommended. 2. Fluid-filled and distended stomach and proximal duodenum suggesting at least partial obstruction related to the mass involving the second portion of the duodenum. 3. Mild hepatic steatosis. 4. Bilateral minimal nonobstructing nephrolithiasis. 5. Mild cardiomegaly. Aortic Atherosclerosis (ICD10-I70.0). Electronically  Signed   By: Helyn Numbers M.D.   On: 07/07/2023 14:09   DG Chest 2 View  Result Date: 07/07/2023 CLINICAL DATA:  Dyspnea with exertion. EXAM: CHEST - 2 VIEW COMPARISON:  None Available. FINDINGS: Mild cardiomegaly. Both lungs are clear. The visualized skeletal structures are unremarkable. IMPRESSION: No active cardiopulmonary disease. Aortic Atherosclerosis (ICD10-I70.0). Electronically Signed   By: Lupita Raider M.D.   On: 07/07/2023 11:37    Pending Labs Unresulted Labs (From admission, onward)     Start     Ordered   07/08/23 0500  Basic metabolic panel  Tomorrow morning,   R        07/07/23 1457   07/08/23 0500  CBC  Tomorrow morning,   R        07/07/23 1457   07/07/23 1425  Prepare RBC (crossmatch)  (Blood Administration Adult)  Once,   R       Question Answer Comment  # of Units 1 unit   Transfusion Indications Hemoglobin < 7 gm/dL and symptomatic   Number of Units to Keep Ahead NO units ahead   If emergent release call blood bank Not emergent release      07/07/23 1425   07/07/23 1130  C Difficile Quick Screen w PCR reflex  Once,   R       References:    CDiff Information Tool   07/07/23 1130   07/07/23 1052  Urine Culture  Once,   URGENT       Question:  Indication  Answer:  Dysuria   07/07/23 1053            Vitals/Pain Today's Vitals   07/07/23 1406 07/07/23 1430 07/07/23 1445 07/07/23 1500  BP: 132/68 (!) 130/59  137/65  Pulse: (!) 106 83    Resp: 18 20 19  (!) 21  Temp: (!) 97.4 F (36.3 C)     TempSrc: Oral     SpO2: 100% 90%    Weight:      Height:      PainSc:        Isolation Precautions Enteric precautions (UV disinfection)  Medications Medications  potassium chloride 10 mEq in 100 mL IVPB (10 mEq Intravenous New Bag/Given 07/07/23 1439)  0.9 %  sodium chloride infusion (Manually program via Guardrails IV Fluids) (has no administration in time range)  benazepril (LOTENSIN) tablet 20 mg (has no administration in time range)   hydrochlorothiazide (HYDRODIURIL) tablet 25 mg (has no administration in time range)  enoxaparin (LOVENOX) injection 40 mg (has no administration in time range)  acetaminophen (TYLENOL) tablet 650 mg (has no administration in time range)    Or  acetaminophen (TYLENOL) suppository 650 mg (has no administration in time range)  morphine (PF) 2 MG/ML injection 1 mg (has no administration in time range)  docusate sodium (COLACE) capsule 100 mg (has no administration in time range)  polyethylene glycol (MIRALAX / GLYCOLAX) packet 17 g (has no administration in time range)  ondansetron (ZOFRAN) tablet 4 mg (has no administration in time range)    Or  ondansetron (ZOFRAN) injection 4 mg (has no administration in time range)  albuterol (PROVENTIL) (2.5 MG/3ML) 0.083% nebulizer solution 2.5 mg (has no administration in time range)  sodium chloride  0.9 % bolus 500 mL (0 mLs Intravenous Stopped 07/07/23 1255)  iohexol (OMNIPAQUE) 300 MG/ML solution 75 mL (75 mLs Intravenous Contrast Given 07/07/23 1319)    Mobility walks with person assist

## 2023-07-07 NOTE — ED Provider Notes (Signed)
Emergency Department Provider Note   I have reviewed the triage vital signs and the nursing notes.   HISTORY  Chief Complaint Weakness and Shortness of Breath   HPI Wendy [name] is a 89 y.o. female with past history of hypertension hyperlipidemia presents emergency department with generalized weakness, constipation, abdominal discomfort, exertional dyspnea.  Symptoms have been progressing over the past 10 days.  She completed a course of multiple antibiotics in the month of June.  She was seen in the outpatient setting and initially started on Macrobid x 14 days followed by Augmentin.  She was then transition to Bactrim.  No culture report available to family.  She has been seen by GYN with no report of bacterial vaginosis or other etiology for her pain.  She states her urine symptoms have improved but after the multiple rounds of antibiotics she had 1 episode of diarrhea and progressively worsening weakness.  The diarrhea has not continued, and in fact she has been constipated.  No fevers.  Denies any chest pain.  She is feeling shortness of breath but only with exertion.  No lightheadedness.  Past Medical History:  Diagnosis Date   Hyperlipidemia    Hypertension    Vitamin D deficiency     Review of Systems  Constitutional: No fever/chills Cardiovascular: Denies chest pain. Respiratory: Positive shortness of breath w/ exertion.  Gastrointestinal: Positive abdominal pain. Positive nausea, no vomiting.  No diarrhea. Positive constipation. Genitourinary: Negative for dysuria. Musculoskeletal: Negative for back pain. Skin: Negative for rash. Neurological: Negative for headaches.  ____________________________________________   PHYSICAL EXAM:  VITAL SIGNS: ED Triage Vitals  Enc Vitals Group     BP 07/07/23 1020 129/68     Pulse Rate 07/07/23 1020 96     Resp 07/07/23 1020 12     Temp 07/07/23 1023 98.3 F (36.8 C)     Temp Source 07/07/23 1023 Oral     SpO2 07/07/23  1020 100 %     Weight 07/07/23 1021 119 lb (54 kg)     Height 07/07/23 1021 5' 6.5" (1.689 m)   Constitutional: Alert and oriented. Well appearing and in no acute distress. Eyes: Conjunctivae are normal.  Head: Atraumatic. Nose: No congestion/rhinnorhea. Mouth/Throat: Mucous membranes are slightly dry.  Neck: No stridor.  Cardiovascular: Normal rate, regular rhythm. Good peripheral circulation. Grossly normal heart sounds.   Respiratory: Normal respiratory effort.  No retractions. Lungs CTAB. Gastrointestinal: Soft and nontender. No distention.  Musculoskeletal: No lower extremity tenderness nor edema. No gross deformities of extremities. Neurologic:  Normal speech and language. No gross focal neurologic deficits are appreciated.  Skin:  Skin is warm, dry and intact. No rash noted.  ____________________________________________   LABS (all labs ordered are listed, but only abnormal results are displayed)  Labs Reviewed  COMPREHENSIVE METABOLIC PANEL - Abnormal; Notable for the following components:      Result Value   Potassium 2.8 (*)    Chloride 97 (*)    Glucose, Bld 141 (*)    BUN 33 (*)    Calcium 10.7 (*)    All other components within normal limits  LIPASE, BLOOD - Abnormal; Notable for the following components:   Lipase 87 (*)    All other components within normal limits  CBC WITH DIFFERENTIAL/PLATELET - Abnormal; Notable for the following components:   RBC 2.26 (*)    Hemoglobin 7.0 (*)    HCT 22.2 (*)    All other components within normal limits  URINALYSIS, W/ REFLEX  TO CULTURE (INFECTION SUSPECTED) - Abnormal; Notable for the following components:   Leukocytes,Ua SMALL (*)    All other components within normal limits  VITAMIN B12 - Abnormal; Notable for the following components:   Vitamin B-12 150 (*)    All other components within normal limits  IRON AND TIBC - Abnormal; Notable for the following components:   Iron 16 (*)    Saturation Ratios 5 (*)    All  other components within normal limits  FERRITIN - Abnormal; Notable for the following components:   Ferritin 10 (*)    All other components within normal limits  RETICULOCYTES - Abnormal; Notable for the following components:   Retic Ct Pct 3.8 (*)    RBC. 2.17 (*)    Immature Retic Fract 21.0 (*)    All other components within normal limits  BASIC METABOLIC PANEL - Abnormal; Notable for the following components:   Sodium 133 (*)    Potassium 3.4 (*)    Glucose, Bld 102 (*)    BUN 26 (*)    All other components within normal limits  CBC - Abnormal; Notable for the following components:   RBC 2.45 (*)    Hemoglobin 7.6 (*)    HCT 23.8 (*)    All other components within normal limits  POC OCCULT BLOOD, ED - Abnormal; Notable for the following components:   Fecal Occult Bld POSITIVE (*)    All other components within normal limits  SARS CORONAVIRUS 2 BY RT PCR  URINE CULTURE  C DIFFICILE QUICK SCREEN W PCR REFLEX    FOLATE  TYPE AND SCREEN  PREPARE RBC (CROSSMATCH)  ABO/RH  TROPONIN I (HIGH SENSITIVITY)  TROPONIN I (HIGH SENSITIVITY)   ____________________________________________  EKG   EKG Interpretation Date/Time:  Monday July 07 2023 10:23:26 EDT Ventricular Rate:  97 PR Interval:    QRS Duration:  86 QT Interval:  309 QTC Calculation: 393 R Axis:   50  Text Interpretation: Atrial fibrillation Ventricular premature complex Nonspecific repol abnormality, diffuse leads Confirmed by Alona Bene [phone]) on 07/07/2023 10:33:25 AM        ____________________________________________  RADIOLOGY  CT ABDOMEN PELVIS W CONTRAST  Result Date: 07/07/2023 CLINICAL DATA:  Abdominal pain, acute, nonlocalized EXAM: CT ABDOMEN AND PELVIS WITH CONTRAST TECHNIQUE: Multidetector CT imaging of the abdomen and pelvis was performed using the standard protocol following bolus administration of intravenous contrast. RADIATION DOSE REDUCTION: This exam was performed according to the  departmental dose-optimization program which includes automated exposure control, adjustment of the mA and/or kV according to patient size and/or use of iterative reconstruction technique. CONTRAST:  75mL OMNIPAQUE IOHEXOL 300 MG/ML  SOLN COMPARISON:  None Available. FINDINGS: Lower chest: Visualized lung bases are clear. Mild cardiomegaly. No acute abnormality. Hepatobiliary: Mild hepatic steatosis. No enhancing intrahepatic mass. No intra or extrahepatic biliary ductal dilation. Gallbladder unremarkable. Pancreas: There is a heterogeneously enhancing expansile mass within the head of the pancreas measuring at least 3.9 x 5.3 by 3.4 cm in dimension, situated within the pancreatico duodenal groove likely invading the second portion of the duodenum, best seen on image # 30/2, and abutting the terminal superior mesenteric vein without invasion or encasement, best seen on image # 30-32/2 and coronal image # 44/8. The main pancreatic duct is deviated superiorly, as is the distal common duct, but does not appear dilated. Focal pancreatitis is a consideration though is considered less likely given the lack of significant surrounding inflammatory stranding and apparent invasion of the second  portion of the duodenum. Single enlarged periportal lymph node is seen at axial image # 24/2 measuring 11 mm in short axis diameter. Normal enhancement and preserved parenchyma involving of the body and tail the pancreas. No peripancreatic fluid collections. Spleen: Unremarkable Adrenals/Urinary Tract: The adrenal glands are unremarkable. The kidneys are normal in size and position. Punctate 1-2 mm nonobstructing calculi are seen within the kidneys bilaterally. No ureteral calculi. No hydronephrosis. No enhancing intrarenal masses. Simple cortical cyst within the interpolar region of the left kidney for which no follow-up imaging is recommended. The bladder is unremarkable. Stomach/Bowel: The stomach and proximal duodenum appears  fluid-filled and distended suggesting at least partial obstruction related to the mass involving the second portion of the duodenum. The stomach, small bowel, and large bowel are otherwise unremarkable. The appendix is normal. No free intraperitoneal gas or fluid. Vascular/Lymphatic: Moderate aortoiliac atherosclerotic calcification. No aortic aneurysm. Superior mesenteric artery and common hepatic artery are separate from the above-mentioned pancreatic mass. No additional pathologic adenopathy within the abdomen and pelvis. Reproductive: Uterus and bilateral adnexa are unremarkable. Other: No abdominal wall hernia Musculoskeletal: Degenerative changes are seen within the lumbar spine and hips bilaterally. No acute bone abnormality. No lytic or blastic bone lesion. IMPRESSION: 1. 5.3 cm heterogeneously enhancing expansile mass within the head of the pancreas, situated within the pancreatico duodenal groove likely invading the second portion of the duodenum and abutting the terminal superior mesenteric vein without invasion or encasement, and deviation of the main biliary and pancreatic ductal structures. Findings are concerning for pancreatic adenocarcinoma. While focal pancreatitis is a consideration, this is considered less likely given the evidence of mass effect and mural invasion. Correlation with CA 19-9 level as well as endoscopic ultrasound and tissue sampling is recommended. 2. Fluid-filled and distended stomach and proximal duodenum suggesting at least partial obstruction related to the mass involving the second portion of the duodenum. 3. Mild hepatic steatosis. 4. Bilateral minimal nonobstructing nephrolithiasis. 5. Mild cardiomegaly. Aortic Atherosclerosis (ICD10-I70.0). Electronically Signed   By: Helyn Numbers M.D.   On: 07/07/2023 14:09   DG Chest 2 View  Result Date: 07/07/2023 CLINICAL DATA:  Dyspnea with exertion. EXAM: CHEST - 2 VIEW COMPARISON:  None Available. FINDINGS: Mild cardiomegaly.  Both lungs are clear. The visualized skeletal structures are unremarkable. IMPRESSION: No active cardiopulmonary disease. Aortic Atherosclerosis (ICD10-I70.0). Electronically Signed   By: Lupita Raider M.D.   On: 07/07/2023 11:37    ____________________________________________   PROCEDURES  Procedure(s) performed:   Procedures  CRITICAL CARE Performed by: Maia Plan Total critical care time: 35 minutes Critical care time was exclusive of separately billable procedures and treating other patients. Critical care was necessary to treat or prevent imminent or life-threatening deterioration. Critical care was time spent personally by me on the following activities: development of treatment plan with patient and/or surrogate as well as nursing, discussions with consultants, evaluation of patient's response to treatment, examination of patient, obtaining history from patient or surrogate, ordering and performing treatments and interventions, ordering and review of laboratory studies, ordering and review of radiographic studies, pulse oximetry and re-evaluation of patient's condition.  Alona Bene, MD Emergency Medicine  ____________________________________________   INITIAL IMPRESSION / ASSESSMENT AND PLAN / ED COURSE  Pertinent labs & imaging results that were available during my care of the patient were reviewed by me and considered in my medical decision making (see chart for details).   This patient is Presenting for Evaluation of abdominal pain, which does require a range  of treatment options, and is a complaint that involves a high risk of morbidity and mortality.  The Differential Diagnoses includes but is not exclusive to acute cholecystitis, intrathoracic causes for epigastric abdominal pain, gastritis, duodenitis, pancreatitis, small bowel or large bowel obstruction, abdominal aortic aneurysm, hernia, gastritis, etc.   Critical Interventions-    Medications  0.9 %  sodium  chloride infusion (Manually program via Guardrails IV Fluids) (0 mLs Intravenous Stopped 07/07/23 2010)  benazepril (LOTENSIN) tablet 20 mg (has no administration in time range)  hydrochlorothiazide (HYDRODIURIL) tablet 25 mg (has no administration in time range)  enoxaparin (LOVENOX) injection 40 mg (40 mg Subcutaneous Given 07/07/23 2227)  acetaminophen (TYLENOL) tablet 650 mg (has no administration in time range)    Or  acetaminophen (TYLENOL) suppository 650 mg (has no administration in time range)  morphine (PF) 2 MG/ML injection 1 mg (has no administration in time range)  docusate sodium (COLACE) capsule 100 mg (100 mg Oral Given 07/07/23 2227)  polyethylene glycol (MIRALAX / GLYCOLAX) packet 17 g (has no administration in time range)  ondansetron (ZOFRAN) tablet 4 mg (has no administration in time range)    Or  ondansetron (ZOFRAN) injection 4 mg (has no administration in time range)  albuterol (PROVENTIL) (2.5 MG/3ML) 0.083% nebulizer solution 2.5 mg (has no administration in time range)  melatonin tablet 5 mg (5 mg Oral Given 07/07/23 2227)  sodium chloride 0.9 % bolus 500 mL (0 mLs Intravenous Stopped 07/07/23 1255)  iohexol (OMNIPAQUE) 300 MG/ML solution 75 mL (75 mLs Intravenous Contrast Given 07/07/23 1319)  potassium chloride SA (KLOR-CON M) CR tablet 40 mEq (40 mEq Oral Given 07/07/23 1739)    Reassessment after intervention: symptoms changed.    I did obtain Additional Historical Information from daughter at bedside.   I decided to review pertinent External Data, and in summary patient with multiple UC visits in June with 3 different abx given in the timeframe.    Clinical Laboratory Tests Ordered, included UA with no acute findings. Occult blood positive. CMP with hypokalemia at 2.8. CBC with Hb of 7. No recent values for comparison. COVID negative.   Radiologic Tests Ordered, included CT abdomen/pelvis and CXR. I independently interpreted the images and agree with radiology  interpretation.   Cardiac Monitor Tracing which shows NSR.    Social Determinants of Health Risk patient is a non-smoker.   Consult complete with [facility]. Plan for admit.   Spoke with Dr. Ewing Schlein. Rounding team will see patient.   Medical Decision Making: Summary:  Patient presents to the ED with worsening generalized weakness. Patient found to have anemia and hypokalemia.  Plan for CT imaging of the abdomen pelvis.  No longer having diarrhea after her prolonged course of multiple antibiotics.  Overall suspicion for C. difficile is low without loose stool.  Reevaluation with update and discussion with patient and daughter at bedside.  CT findings including pancreatic mass noted. GI to evaluate. Will order PRBC and K+ infusion.   Patient's presentation is most consistent with acute presentation with potential threat to life or bodily function.   Disposition: admit  ____________________________________________  FINAL CLINICAL IMPRESSION(S) / ED DIAGNOSES  Final diagnoses:  Generalized weakness  Anemia, unspecified type  Hypokalemia  Pancreatic mass     NEW OUTPATIENT MEDICATIONS STARTED DURING THIS VISIT:  Current Discharge Medication List     START taking these medications   Details  !! Rivaroxaban (XARELTO) 15 MG TABS tablet Take 1 tablet (15 mg total) by mouth 2 (two) times  daily with a meal. Qty: 14 tablet, Refills: 0     !! - Potential duplicate medications found. Please discuss with provider.      Note:  This document was prepared using Dragon voice recognition software and may include unintentional dictation errors.  Alona Bene, MD, [facility]    Kaeleb Emond, Arlyss Repress, MD 07/08/23 [phone]

## 2023-07-07 NOTE — Consult Note (Signed)
Reason for Consult: Abnormal CT Referring Physician: Hospital team  Wendy [name] is an 89 y.o. female.  HPI: Patient seen and examined and case discussed with her daughter and her hospital computer chart reviewed and she was essentially asymptomatic up until 10 days ago when she had a UTI and then the antibiotics started making her sick and up until then she been walking 4 to 6 miles a day and mowing the lawn and she has had no previous GI issues and over the last week she has had increased nausea vomiting and diarrhea and had not lost any weight other than a few pounds when she took care of her husband after her back surgery she has no other complaints  Past Medical History:  Diagnosis Date   Hyperlipidemia    Hypertension    Vitamin D deficiency     History reviewed. No pertinent surgical history.  Family History  Problem Relation Age of Onset   Pancreatic cancer Father    Cancer Brother     Social History:  reports that she has never smoked. She has never used smokeless tobacco. She reports that she does not drink alcohol and does not use drugs.  Allergies: No Known Allergies  Medications: I have reviewed the patient's current medications.  Results for orders placed or performed during the hospital encounter of 07/07/23 (from the past 48 hour(s))  Comprehensive metabolic panel     Status: Abnormal   Collection Time: 07/07/23 10:38 AM  Result Value Ref Range   Sodium 135 135 - 145 mmol/L   Potassium 2.8 (L) 3.5 - 5.1 mmol/L   Chloride 97 (L) 98 - 111 mmol/L   CO2 29 22 - 32 mmol/L   Glucose, Bld 141 (H) 70 - 99 mg/dL    Comment: Glucose reference range applies only to samples taken after fasting for at least 8 hours.   BUN 33 (H) 8 - 23 mg/dL   Creatinine, Ser 4.09 0.44 - 1.00 mg/dL   Calcium 81.1 (H) 8.9 - 10.3 mg/dL   Total Protein 6.6 6.5 - 8.1 g/dL   Albumin 3.8 3.5 - 5.0 g/dL   AST 26 15 - 41 U/L   ALT 18 0 - 44 U/L   Alkaline Phosphatase 52 38 - 126 U/L    Total Bilirubin 0.5 0.3 - 1.2 mg/dL   GFR, Estimated >91 >47 mL/min    Comment: (NOTE) Calculated using the CKD-EPI Creatinine Equation (2021)    Anion gap 9 5 - 15    Comment: Performed at [facility], [address], [place], [place] [address]  Lipase, blood     Status: Abnormal   Collection Time: 07/07/23 10:38 AM  Result Value Ref Range   Lipase 87 (H) 11 - 51 U/L    Comment: Performed at [facility], [address], [place], [place] [address]  Troponin I (High Sensitivity)     Status: None   Collection Time: 07/07/23 10:38 AM  Result Value Ref Range   Troponin I (High Sensitivity) 13 <18 ng/L    Comment: (NOTE) Elevated high sensitivity troponin I (hsTnI) values and significant  changes across serial measurements may suggest ACS but many other  chronic and acute conditions are known to elevate hsTnI results.  Refer to the "Links" section for chest pain algorithms and additional  guidance. Performed at [facility], [address] [place], [place] [address]   CBC with Differential     Status: Abnormal  Collection Time: 07/07/23 10:38 AM  Result Value Ref Range   WBC 5.2 4.0 - 10.5 K/uL   RBC 2.26 (L) 3.87 - 5.11 MIL/uL   Hemoglobin 7.0 (L) 12.0 - 15.0 g/dL   HCT 16.1 (L) 09.6 - 04.5 %   MCV 98.2 80.0 - 100.0 fL   MCH 31.0 26.0 - 34.0 pg   MCHC 31.5 30.0 - 36.0 g/dL   RDW 40.9 81.1 - 91.4 %   Platelets 283 150 - 400 K/uL   nRBC 0.0 0.0 - 0.2 %   Neutrophils Relative % 68 %   Neutro Abs 3.5 1.7 - 7.7 K/uL   Lymphocytes Relative 20 %   Lymphs Abs 1.0 0.7 - 4.0 K/uL   Monocytes Relative 10 %   Monocytes Absolute 0.5 0.1 - 1.0 K/uL   Eosinophils Relative 1 %   Eosinophils Absolute 0.0 0.0 - 0.5 K/uL   Basophils Relative 1 %   Basophils Absolute 0.1 0.0 - 0.1 K/uL   Immature Granulocytes 0 %   Abs Immature Granulocytes 0.01 0.00 - 0.07 K/uL    Comment: Performed at [facility], [address] [place], [place] [address]  ABO/Rh     Status: None   Collection Time: 07/07/23 10:38 AM  Result Value Ref Range   ABO/RH(D)      A POS Performed at [facility], [address] [place], [place] [address]   SARS Coronavirus 2 by RT PCR (hospital order, performed in [facility] hospital lab) *cepheid single result test* Anterior Nasal Swab     Status: None   Collection Time: 07/07/23 11:00 AM   Specimen: Anterior Nasal Swab  Result Value Ref Range   SARS Coronavirus 2 by RT PCR NEGATIVE NEGATIVE    Comment: (NOTE) SARS-CoV-2 target nucleic acids are NOT DETECTED.  The SARS-CoV-2 RNA is generally detectable in upper and lower respiratory specimens during the acute phase of infection. The lowest concentration of SARS-CoV-2 viral copies this assay can detect is 250 copies / mL. A negative result does not preclude SARS-CoV-2 infection and should not be used as the sole basis for treatment or other patient management decisions.  A negative result may occur with improper specimen collection / handling, submission of specimen other than nasopharyngeal swab, presence of viral mutation(s) within the areas targeted by this assay, and inadequate number of viral copies (<250 copies / mL). A negative result must be combined with clinical observations, patient history, and epidemiological information.  Fact Sheet for Patients:   RoadLapTop.co.za  Fact Sheet for Healthcare Providers: http://kim-miller.com/  This test is not yet approved or  cleared by the [place] FDA and has been authorized for detection and/or diagnosis of SARS-CoV-2 by FDA under an Emergency Use Authorization (EUA).  This EUA will remain in effect (meaning this test can be used) for the duration of the COVID-19 declaration under Section 564(b)(1) of the Act, 21 U.S.C. section 360bbb-3(b)(1), unless the authorization is terminated or revoked  sooner.  Performed at [facility], [address] [place], [place] [address]   Troponin I (High Sensitivity)     Status: None   Collection Time: 07/07/23 12:46 PM  Result Value Ref Range   Troponin I (High Sensitivity) 12 <18 ng/L    Comment: (NOTE) Elevated high sensitivity troponin I (hsTnI) values and significant  changes across serial measurements may suggest ACS but many other  chronic and acute conditions are known to elevate hsTnI results.  Refer  to the "Links" section for chest pain algorithms and additional  guidance. Performed at [facility], [address] [place], [place] [address]   Vitamin B12     Status: Abnormal   Collection Time: 07/07/23 12:46 PM  Result Value Ref Range   Vitamin B-12 150 (L) 180 - 914 pg/mL    Comment: (NOTE) This assay is not validated for testing neonatal or myeloproliferative syndrome specimens for Vitamin B12 levels. Performed at [facility], [address], [place], [place] [address]   Folate     Status: None   Collection Time: 07/07/23 12:46 PM  Result Value Ref Range   Folate 8.9 >5.9 ng/mL    Comment: Performed at [facility], [address] [place], [place] [address]  Iron and TIBC     Status: Abnormal   Collection Time: 07/07/23 12:46 PM  Result Value Ref Range   Iron 16 (L) 28 - 170 ug/dL   TIBC 914 782 - 956 ug/dL   Saturation Ratios 5 (L) 10.4 - 31.8 %   UIBC 300 ug/dL    Comment: Performed at [facility], [address], [place], [place] [address]  Ferritin     Status: Abnormal   Collection Time: 07/07/23 12:46 PM  Result Value Ref Range   Ferritin 10 (L) 11 - 307 ng/mL    Comment: Performed at [facility], [address], [place], [place] [address]  Reticulocytes     Status: Abnormal   Collection Time: 07/07/23 12:46 PM  Result Value Ref Range   Retic Ct Pct 3.8 (H) 0.4 - 3.1 %   RBC. 2.17 (L)  3.87 - 5.11 MIL/uL   Retic Count, Absolute 82.0 19.0 - 186.0 K/uL   Immature Retic Fract 21.0 (H) 2.3 - 15.9 %    Comment: Performed at [facility], [address], [place], [place] [address]  Type and screen [facility]     Status: None (Preliminary result)   Collection Time: 07/07/23 12:46 PM  Result Value Ref Range   ABO/RH(D) A POS    Antibody Screen NEG    Sample Expiration 07/10/2023,2359    Unit Number [id number]    Blood Component Type RBC LR PHER1    Unit division 00    Status of Unit ISSUED    Transfusion Status OK TO TRANSFUSE    Crossmatch Result      Compatible Performed at [facility], [address] [place], [place] [address]   Urinalysis, w/ Reflex to Culture (Infection Suspected) -Urine, Clean Catch     Status: Abnormal   Collection Time: 07/07/23  2:15 PM  Result Value Ref Range   Specimen Source URINE, CLEAN CATCH    Color, Urine YELLOW YELLOW   APPearance CLEAR CLEAR   Specific Gravity, Urine 1.029 1.005 - 1.030   pH 5.0 5.0 - 8.0   Glucose, UA NEGATIVE NEGATIVE mg/dL   Hgb urine dipstick NEGATIVE NEGATIVE   Bilirubin Urine NEGATIVE NEGATIVE   Ketones, ur NEGATIVE NEGATIVE mg/dL   Protein, ur NEGATIVE NEGATIVE mg/dL   Nitrite NEGATIVE NEGATIVE   Leukocytes,Ua SMALL (A) NEGATIVE   RBC / HPF 0-5 0 - 5 RBC/hpf   WBC, UA 0-5 0 - 5 WBC/hpf    Comment:        Reflex urine culture not performed if WBC <=10, OR if Squamous epithelial cells >5. If Squamous epithelial cells >5 suggest recollection.  Bacteria, UA NONE SEEN NONE SEEN   Squamous Epithelial / HPF 0-5 0 - 5 /HPF   Mucus PRESENT    Hyaline Casts, UA PRESENT     Comment: Performed at [facility], [address], [place], [place] [address]  POC occult blood, ED     Status: Abnormal   Collection Time: 07/07/23  2:47 PM  Result Value Ref Range   Fecal Occult Bld POSITIVE (A) NEGATIVE  Prepare RBC (crossmatch)      Status: None   Collection Time: 07/07/23  4:31 PM  Result Value Ref Range   Order Confirmation      ORDER PROCESSED BY BLOOD BANK Performed at [facility], [address], [place], [place] [address]     CT ABDOMEN PELVIS W CONTRAST  Result Date: 07/07/2023 CLINICAL DATA:  Abdominal pain, acute, nonlocalized EXAM: CT ABDOMEN AND PELVIS WITH CONTRAST TECHNIQUE: Multidetector CT imaging of the abdomen and pelvis was performed using the standard protocol following bolus administration of intravenous contrast. RADIATION DOSE REDUCTION: This exam was performed according to the departmental dose-optimization program which includes automated exposure control, adjustment of the mA and/or kV according to patient size and/or use of iterative reconstruction technique. CONTRAST:  75mL OMNIPAQUE IOHEXOL 300 MG/ML  SOLN COMPARISON:  None Available. FINDINGS: Lower chest: Visualized lung bases are clear. Mild cardiomegaly. No acute abnormality. Hepatobiliary: Mild hepatic steatosis. No enhancing intrahepatic mass. No intra or extrahepatic biliary ductal dilation. Gallbladder unremarkable. Pancreas: There is a heterogeneously enhancing expansile mass within the head of the pancreas measuring at least 3.9 x 5.3 by 3.4 cm in dimension, situated within the pancreatico duodenal groove likely invading the second portion of the duodenum, best seen on image # 30/2, and abutting the terminal superior mesenteric vein without invasion or encasement, best seen on image # 30-32/2 and coronal image # 44/8. The main pancreatic duct is deviated superiorly, as is the distal common duct, but does not appear dilated. Focal pancreatitis is a consideration though is considered less likely given the lack of significant surrounding inflammatory stranding and apparent invasion of the second portion of the duodenum. Single enlarged periportal lymph node is seen at axial image # 24/2 measuring 11 mm in short axis diameter.  Normal enhancement and preserved parenchyma involving of the body and tail the pancreas. No peripancreatic fluid collections. Spleen: Unremarkable Adrenals/Urinary Tract: The adrenal glands are unremarkable. The kidneys are normal in size and position. Punctate 1-2 mm nonobstructing calculi are seen within the kidneys bilaterally. No ureteral calculi. No hydronephrosis. No enhancing intrarenal masses. Simple cortical cyst within the interpolar region of the left kidney for which no follow-up imaging is recommended. The bladder is unremarkable. Stomach/Bowel: The stomach and proximal duodenum appears fluid-filled and distended suggesting at least partial obstruction related to the mass involving the second portion of the duodenum. The stomach, small bowel, and large bowel are otherwise unremarkable. The appendix is normal. No free intraperitoneal gas or fluid. Vascular/Lymphatic: Moderate aortoiliac atherosclerotic calcification. No aortic aneurysm. Superior mesenteric artery and common hepatic artery are separate from the above-mentioned pancreatic mass. No additional pathologic adenopathy within the abdomen and pelvis. Reproductive: Uterus and bilateral adnexa are unremarkable. Other: No abdominal wall hernia Musculoskeletal: Degenerative changes are seen within the lumbar spine and hips bilaterally. No acute bone abnormality. No lytic or blastic bone lesion. IMPRESSION: 1. 5.3 cm heterogeneously enhancing expansile mass within the head of the pancreas, situated within the pancreatico duodenal groove likely invading the second portion of  the duodenum and abutting the terminal superior mesenteric vein without invasion or encasement, and deviation of the main biliary and pancreatic ductal structures. Findings are concerning for pancreatic adenocarcinoma. While focal pancreatitis is a consideration, this is considered less likely given the evidence of mass effect and mural invasion. Correlation with CA 19-9 level as  well as endoscopic ultrasound and tissue sampling is recommended. 2. Fluid-filled and distended stomach and proximal duodenum suggesting at least partial obstruction related to the mass involving the second portion of the duodenum. 3. Mild hepatic steatosis. 4. Bilateral minimal nonobstructing nephrolithiasis. 5. Mild cardiomegaly. Aortic Atherosclerosis (ICD10-I70.0). Electronically Signed   By: Helyn Numbers M.D.   On: 07/07/2023 14:09   DG Chest 2 View  Result Date: 07/07/2023 CLINICAL DATA:  Dyspnea with exertion. EXAM: CHEST - 2 VIEW COMPARISON:  None Available. FINDINGS: Mild cardiomegaly. Both lungs are clear. The visualized skeletal structures are unremarkable. IMPRESSION: No active cardiopulmonary disease. Aortic Atherosclerosis (ICD10-I70.0). Electronically Signed   By: Lupita Raider M.D.   On: 07/07/2023 11:37    ROS negative except above her last blood thinner was Saturday Blood pressure 131/61, pulse 86, temperature 98 F (36.7 C), resp. rate 18, height 5' 6.5" (1.689 m), weight 54 kg, SpO2 94 %. Physical Exam vital signs stable afebrile no acute distress abdomen is soft nontender CT reviewed labs reviewed slight increased BUN iron deficient hemoglobin 7 test normal  Assessment/Plan: Concerns over pancreatic cancer Plan: The risk benefits methods of endoscopy was discussed with the patient and her daughter and we will proceed tomorrow to see if the mass extends into the duodenum and if unable to biopsy try to get an EUS next ASAP and I have discussed her case with my partner Dr. Dulce Sellar who might have some availability on Wednesday if needed but we will wait on above and all of the patient and her daughter's questions were answered and we discussed the possible diagnosis  Illyanna Petillo E 07/07/2023, 5:30 PM

## 2023-07-07 NOTE — ED Notes (Signed)
Patient stating she is unable to give urine sample as she has not had enough fluids.

## 2023-07-07 NOTE — ED Notes (Signed)
Patient attempted to use the bedside commode but had no luck in obtaining a urine sample.

## 2023-07-07 NOTE — ED Triage Notes (Signed)
Pt reports nausea all weekend with indigestion. States last month she was seen at doc for UTI and on '38 abx for month of June." States she started having diarrhea Thursday before last. Endorses SOB with exertion and lots of weakness. Pt not on abx currently.

## 2023-07-07 NOTE — H&P (Signed)
History and Physical  Wendy [name] ZOX:[id number] DOB: 06-Jan-1934 DOA: 07/07/2023  PCP: [name], MD   Chief Complaint: Nausea, constipation  HPI: Wendy [name] is a 89 y.o. female with medical history significant for fibrillation, hypertension, hyperlipidemia being admitted to the hospital with vomiting, constipation, iron deficiency anemia and new finding of pancreatic mass.  Patient's family members at the bedside, they tell me that the patient has been very healthy and active.  She takes Xarelto for her atrial fibrillation, last kept it down on 7/5.  Any case, she describes multiple courses of antibiotics in June, after which she started having some diarrhea and abdominal discomfort.  This was followed by difficulty eating, and solid foods and water would "come back up."She has had some weight loss in the last year, but thinks this is because she has had a difficult year taking care of her husband who has been sick.  Denies any blood in her stool, any dark tarry stools.  No blood in her emesis.  ED Course: She presented to the ER, was found to be minimally tachycardic, hemodynamically stable, saturating well on room air.  Lab work was done significant for iron deficiency, as well as new anemia hemoglobin 7, potassium 2.8, normal renal function, and normal LFTs.  Due to her continued abdominal complaints and nausea, she had CT scan of the abdomen and pelvis, with results described below.  Review of Systems: Please see HPI for pertinent positives and negatives. A complete 10 system review of systems are otherwise negative.  Past Medical History:  Diagnosis Date   Hyperlipidemia    Hypertension    Vitamin D deficiency    History reviewed. No pertinent surgical history.  Social History:  reports that she has never smoked. She has never used smokeless tobacco. She reports that she does not drink alcohol and does not use drugs.   No Known Allergies  Family History  Problem  Relation Age of Onset   Pancreatic cancer Father    Cancer Brother      Prior to Admission medications   Medication Sig Start Date End Date Taking? Authorizing Provider  benazepril (LOTENSIN) 20 MG tablet Take 20 mg by mouth daily.    [provider]  hydrochlorothiazide (HYDRODIURIL) 25 MG tablet Take 25 mg by mouth daily.    [provider]  niacin-simvastatin [facility]) 750-20 MG 24 hr tablet Take 0.5 tablets by mouth at bedtime.    [provider]  Omega-3 Fatty Acids (FISH OIL PO) Take by mouth.    [provider]  Rivaroxaban (XARELTO) 15 MG TABS tablet Take 1 tablet by mouth once daily 01/09/23   [name], [name] M, MD  Rivaroxaban (XARELTO) 15 MG TABS tablet Take 1 tablet (15 mg total) by mouth 2 (two) times daily with a meal. 07/07/23   [name], [name] M, MD  Vitamin D, Ergocalciferol, 2000 units CAPS Take 1 capsule by mouth daily.    [provider]    Physical Exam: BP 137/65   Pulse 83   Temp (!) 97.4 F (36.3 C) (Oral)   Resp (!) 21   Ht 5' 6.5" (1.689 m)   Wt 54 kg   SpO2 90%   BMI 18.92 kg/m   General:  Alert, oriented, calm, in no acute distress, family member at the bedside. Eyes: EOMI, clear conjuctivae, white sclerea Neck: supple, no masses, trachea mildline  Cardiovascular: RRR, no murmurs or rubs, no peripheral edema  Respiratory: clear to auscultation bilaterally, no wheezes,  no crackles  Abdomen: soft, nontender, nondistended, normal bowel tones heard  Skin: dry, no rashes  Musculoskeletal: no joint effusions, normal range of motion  Psychiatric: appropriate affect, normal speech  Neurologic: extraocular muscles intact, clear speech, moving all extremities with intact sensorium          Labs on Admission:  Basic Metabolic Panel: Recent Labs  Lab 07/07/23 1038  NA 135  K 2.8*  CL 97*  CO2 29  GLUCOSE 141*  BUN 33*  CREATININE 0.91  CALCIUM 10.7*   Liver Function Tests: Recent Labs  Lab 07/07/23 1038   AST 26  ALT 18  ALKPHOS 52  BILITOT 0.5  PROT 6.6  ALBUMIN 3.8   Recent Labs  Lab 07/07/23 1038  LIPASE 87*   No results for input(s): "AMMONIA" in the last 168 hours. CBC: Recent Labs  Lab 07/07/23 1038  WBC 5.2  NEUTROABS 3.5  HGB 7.0*  HCT 22.2*  MCV 98.2  PLT 283   Cardiac Enzymes: No results for input(s): "CKTOTAL", "CKMB", "CKMBINDEX", "TROPONINI" in the last 168 hours.  BNP (last 3 results) No results for input(s): "BNP" in the last 8760 hours.  ProBNP (last 3 results) No results for input(s): "PROBNP" in the last 8760 hours.  CBG: No results for input(s): "GLUCAP" in the last 168 hours.  Radiological Exams on Admission: CT ABDOMEN PELVIS W CONTRAST  Result Date: 07/07/2023 CLINICAL DATA:  Abdominal pain, acute, nonlocalized EXAM: CT ABDOMEN AND PELVIS WITH CONTRAST TECHNIQUE: Multidetector CT imaging of the abdomen and pelvis was performed using the standard protocol following bolus administration of intravenous contrast. RADIATION DOSE REDUCTION: This exam was performed according to the departmental dose-optimization program which includes automated exposure control, adjustment of the mA and/or kV according to patient size and/or use of iterative reconstruction technique. CONTRAST:  75mL OMNIPAQUE IOHEXOL 300 MG/ML  SOLN COMPARISON:  None Available. FINDINGS: Lower chest: Visualized lung bases are clear. Mild cardiomegaly. No acute abnormality. Hepatobiliary: Mild hepatic steatosis. No enhancing intrahepatic mass. No intra or extrahepatic biliary ductal dilation. Gallbladder unremarkable. Pancreas: There is a heterogeneously enhancing expansile mass within the head of the pancreas measuring at least 3.9 x 5.3 by 3.4 cm in dimension, situated within the pancreatico duodenal groove likely invading the second portion of the duodenum, best seen on image # 30/2, and abutting the terminal superior mesenteric vein without invasion or encasement, best seen on image # 30-32/2  and coronal image # 44/8. The main pancreatic duct is deviated superiorly, as is the distal common duct, but does not appear dilated. Focal pancreatitis is a consideration though is considered less likely given the lack of significant surrounding inflammatory stranding and apparent invasion of the second portion of the duodenum. Single enlarged periportal lymph node is seen at axial image # 24/2 measuring 11 mm in short axis diameter. Normal enhancement and preserved parenchyma involving of the body and tail the pancreas. No peripancreatic fluid collections. Spleen: Unremarkable Adrenals/Urinary Tract: The adrenal glands are unremarkable. The kidneys are normal in size and position. Punctate 1-2 mm nonobstructing calculi are seen within the kidneys bilaterally. No ureteral calculi. No hydronephrosis. No enhancing intrarenal masses. Simple cortical cyst within the interpolar region of the left kidney for which no follow-up imaging is recommended. The bladder is unremarkable. Stomach/Bowel: The stomach and proximal duodenum appears fluid-filled and distended suggesting at least partial obstruction related to the mass involving the second portion of the duodenum. The stomach, small bowel, and large bowel are otherwise unremarkable. The appendix  is normal. No free intraperitoneal gas or fluid. Vascular/Lymphatic: Moderate aortoiliac atherosclerotic calcification. No aortic aneurysm. Superior mesenteric artery and common hepatic artery are separate from the above-mentioned pancreatic mass. No additional pathologic adenopathy within the abdomen and pelvis. Reproductive: Uterus and bilateral adnexa are unremarkable. Other: No abdominal wall hernia Musculoskeletal: Degenerative changes are seen within the lumbar spine and hips bilaterally. No acute bone abnormality. No lytic or blastic bone lesion. IMPRESSION: 1. 5.3 cm heterogeneously enhancing expansile mass within the head of the pancreas, situated within the pancreatico  duodenal groove likely invading the second portion of the duodenum and abutting the terminal superior mesenteric vein without invasion or encasement, and deviation of the main biliary and pancreatic ductal structures. Findings are concerning for pancreatic adenocarcinoma. While focal pancreatitis is a consideration, this is considered less likely given the evidence of mass effect and mural invasion. Correlation with CA 19-9 level as well as endoscopic ultrasound and tissue sampling is recommended. 2. Fluid-filled and distended stomach and proximal duodenum suggesting at least partial obstruction related to the mass involving the second portion of the duodenum. 3. Mild hepatic steatosis. 4. Bilateral minimal nonobstructing nephrolithiasis. 5. Mild cardiomegaly. Aortic Atherosclerosis (ICD10-I70.0). Electronically Signed   By: Helyn Numbers M.D.   On: 07/07/2023 14:09   DG Chest 2 View  Result Date: 07/07/2023 CLINICAL DATA:  Dyspnea with exertion. EXAM: CHEST - 2 VIEW COMPARISON:  None Available. FINDINGS: Mild cardiomegaly. Both lungs are clear. The visualized skeletal structures are unremarkable. IMPRESSION: No active cardiopulmonary disease. Aortic Atherosclerosis (ICD10-I70.0). Electronically Signed   By: Lupita Raider M.D.   On: 07/07/2023 11:37    Assessment/Plan Pleasant and active 89 year old Caucasian female with a history of hypertension, hyperlipidemia, atrial fibrillation on Xarelto being admitted to the hospital with iron deficiency anemia and pancreatic mass.  Heterogenous pancreatic head mass, with mass effect on second portion of the duodenum, and main and pancreatic biliary ductal structures-this is certainly concerning for malignancy, and likely is causing her nausea and obstructive symptoms. -Observation admission -Clear liquid diet and n.p.o. after midnight -ER Dr. Jacqulyn Bath has discussed with gastroenterology who will consult -Hold Xarelto in case of biopsy -Will likely need outpatient  oncology consultation and further imaging for staging pending pathology  Hypokalemia-likely due to recent vomiting -Repleted IV in the ER -Recheck with morning labs  Iron deficiency anemia-unknown baseline in our system, however patient has no known history of anemia.  She may be suffering from severe iron deficiency in the setting of active malignancy.  Note FOBT positive in the ER. -Transfuse 1 unit PRBC -Recheck hemoglobin in the morning  Hypertension-continue home Lotensin and hydrochlorothiazide  Atrial fibrillation-on Xarelto at home, will hold full anticoagulation for now as needed for biopsy is anticipated  DVT prophylaxis: Lovenox     Code Status: Full Code  Consults called: None  Admission status: The appropriate patient status for this patient is INPATIENT. Inpatient status is judged to be reasonable and necessary in order to provide the required intensity of service to ensure the patient's safety. The patient's presenting symptoms, physical exam findings, and initial radiographic and laboratory data in the context of their chronic comorbidities is felt to place them at high risk for further clinical deterioration. Furthermore, it is not anticipated that the patient will be medically stable for discharge from the hospital within 2 midnights of admission.    I certify that at the point of admission it is my clinical judgment that the patient will require inpatient hospital care spanning  beyond 2 midnights from the point of admission due to high intensity of service, high risk for further deterioration and high frequency of surveillance required   Time spent: 52 minutes  Gwyneth Fernandez Sharlette Dense MD [facility] Pager [phone]  If 7PM-7AM, please contact night-coverage [email or web address] Password [facility]  07/07/2023, 3:18 PM

## 2023-07-08 ENCOUNTER — Observation Stay (HOSPITAL_COMMUNITY): Payer: Medicare Other | Admitting: Anesthesiology

## 2023-07-08 ENCOUNTER — Encounter (HOSPITAL_COMMUNITY): Payer: Self-pay | Admitting: Internal Medicine

## 2023-07-08 ENCOUNTER — Encounter (HOSPITAL_COMMUNITY)
Admission: EM | Disposition: A | Discharge status: Home / Self Care | Payer: Self-pay | Source: Home / Self Care | Attending: Internal Medicine

## 2023-07-08 DIAGNOSIS — E785 Hyperlipidemia, unspecified: Secondary | ICD-10-CM | POA: Diagnosis present

## 2023-07-08 DIAGNOSIS — E611 Iron deficiency: Secondary | ICD-10-CM | POA: Diagnosis not present

## 2023-07-08 DIAGNOSIS — Z681 Body mass index (BMI) 19 or less, adult: Secondary | ICD-10-CM | POA: Diagnosis not present

## 2023-07-08 DIAGNOSIS — Z809 Family history of malignant neoplasm, unspecified: Secondary | ICD-10-CM | POA: Diagnosis not present

## 2023-07-08 DIAGNOSIS — E876 Hypokalemia: Secondary | ICD-10-CM | POA: Diagnosis present

## 2023-07-08 DIAGNOSIS — I4891 Unspecified atrial fibrillation: Secondary | ICD-10-CM | POA: Diagnosis present

## 2023-07-08 DIAGNOSIS — K3189 Other diseases of stomach and duodenum: Secondary | ICD-10-CM | POA: Diagnosis not present

## 2023-07-08 DIAGNOSIS — Z79899 Other long term (current) drug therapy: Secondary | ICD-10-CM | POA: Diagnosis not present

## 2023-07-08 DIAGNOSIS — E871 Hypo-osmolality and hyponatremia: Secondary | ICD-10-CM | POA: Diagnosis present

## 2023-07-08 DIAGNOSIS — C170 Malignant neoplasm of duodenum: Secondary | ICD-10-CM | POA: Diagnosis not present

## 2023-07-08 DIAGNOSIS — I10 Essential (primary) hypertension: Secondary | ICD-10-CM | POA: Diagnosis present

## 2023-07-08 DIAGNOSIS — D649 Anemia, unspecified: Secondary | ICD-10-CM | POA: Diagnosis not present

## 2023-07-08 DIAGNOSIS — E559 Vitamin D deficiency, unspecified: Secondary | ICD-10-CM | POA: Diagnosis present

## 2023-07-08 DIAGNOSIS — Z882 Allergy status to sulfonamides status: Secondary | ICD-10-CM | POA: Diagnosis not present

## 2023-07-08 DIAGNOSIS — Z1152 Encounter for screening for COVID-19: Secondary | ICD-10-CM | POA: Diagnosis not present

## 2023-07-08 DIAGNOSIS — I700 Atherosclerosis of aorta: Secondary | ICD-10-CM | POA: Diagnosis not present

## 2023-07-08 DIAGNOSIS — K8689 Other specified diseases of pancreas: Secondary | ICD-10-CM | POA: Diagnosis not present

## 2023-07-08 DIAGNOSIS — D490 Neoplasm of unspecified behavior of digestive system: Secondary | ICD-10-CM | POA: Diagnosis not present

## 2023-07-08 DIAGNOSIS — I272 Pulmonary hypertension, unspecified: Secondary | ICD-10-CM | POA: Diagnosis not present

## 2023-07-08 DIAGNOSIS — T182XXA Foreign body in stomach, initial encounter: Secondary | ICD-10-CM | POA: Diagnosis not present

## 2023-07-08 DIAGNOSIS — D509 Iron deficiency anemia, unspecified: Secondary | ICD-10-CM | POA: Diagnosis present

## 2023-07-08 DIAGNOSIS — E538 Deficiency of other specified B group vitamins: Secondary | ICD-10-CM | POA: Diagnosis present

## 2023-07-08 DIAGNOSIS — C259 Malignant neoplasm of pancreas, unspecified: Secondary | ICD-10-CM | POA: Diagnosis not present

## 2023-07-08 DIAGNOSIS — R918 Other nonspecific abnormal finding of lung field: Secondary | ICD-10-CM | POA: Diagnosis not present

## 2023-07-08 DIAGNOSIS — R933 Abnormal findings on diagnostic imaging of other parts of digestive tract: Secondary | ICD-10-CM | POA: Diagnosis not present

## 2023-07-08 DIAGNOSIS — E440 Moderate protein-calorie malnutrition: Secondary | ICD-10-CM | POA: Diagnosis present

## 2023-07-08 DIAGNOSIS — C784 Secondary malignant neoplasm of small intestine: Secondary | ICD-10-CM | POA: Diagnosis present

## 2023-07-08 DIAGNOSIS — K5900 Constipation, unspecified: Secondary | ICD-10-CM | POA: Diagnosis present

## 2023-07-08 DIAGNOSIS — K319 Disease of stomach and duodenum, unspecified: Secondary | ICD-10-CM | POA: Diagnosis not present

## 2023-07-08 DIAGNOSIS — Z7901 Long term (current) use of anticoagulants: Secondary | ICD-10-CM | POA: Diagnosis not present

## 2023-07-08 DIAGNOSIS — Z800 Family history of malignant neoplasm of digestive organs: Secondary | ICD-10-CM | POA: Diagnosis not present

## 2023-07-08 DIAGNOSIS — R0602 Shortness of breath: Secondary | ICD-10-CM | POA: Diagnosis present

## 2023-07-08 DIAGNOSIS — C250 Malignant neoplasm of head of pancreas: Secondary | ICD-10-CM | POA: Diagnosis present

## 2023-07-08 DIAGNOSIS — K315 Obstruction of duodenum: Secondary | ICD-10-CM | POA: Diagnosis present

## 2023-07-08 HISTORY — PX: ESOPHAGOGASTRODUODENOSCOPY (EGD) WITH PROPOFOL: SHX5813

## 2023-07-08 HISTORY — PX: BIOPSY: SHX5522

## 2023-07-08 LAB — BASIC METABOLIC PANEL
Anion gap: 9 (ref 5–15)
BUN: 26 mg/dL — ABNORMAL HIGH (ref 8–23)
CO2: 23 mmol/L (ref 22–32)
Calcium: 9.1 mg/dL (ref 8.9–10.3)
Chloride: 101 mmol/L (ref 98–111)
Creatinine, Ser: 0.79 mg/dL (ref 0.44–1.00)
GFR, Estimated: >60 mL/min (ref >60)
Glucose, Bld: 102 mg/dL — ABNORMAL HIGH (ref 70–99)
Potassium: 3.4 mmol/L — ABNORMAL LOW (ref 3.5–5.1)
Sodium: 133 mmol/L — ABNORMAL LOW (ref 135–145)

## 2023-07-08 LAB — BPAM RBC: Unit Type and Rh: 6200

## 2023-07-08 LAB — CBC
HCT: 23.8 % — ABNORMAL LOW (ref 36.0–46.0)
Hemoglobin: 7.6 g/dL — ABNORMAL LOW (ref 12.0–15.0)
MCH: 31 pg (ref 26.0–34.0)
MCHC: 31.9 g/dL (ref 30.0–36.0)
MCV: 97.1 fL (ref 80.0–100.0)
Platelets: 213 10*3/uL (ref 150–400)
RBC: 2.45 MIL/uL — ABNORMAL LOW (ref 3.87–5.11)
RDW: 14.3 % (ref 11.5–15.5)
WBC: 4.3 10*3/uL (ref 4.0–10.5)
nRBC: 0 % (ref 0.0–0.2)

## 2023-07-08 LAB — TYPE AND SCREEN
ABO/RH(D): A POS
Antibody Screen: NEGATIVE
Unit division: 0

## 2023-07-08 LAB — URINE CULTURE

## 2023-07-08 SURGERY — ESOPHAGOGASTRODUODENOSCOPY (EGD) WITH PROPOFOL
Anesthesia: Monitor Anesthesia Care

## 2023-07-08 MED ORDER — SODIUM CHLORIDE 0.9 % IV SOLN: INTRAVENOUS | Status: DC

## 2023-07-08 MED ORDER — PROPOFOL 10 MG/ML IV BOLUS
INTRAVENOUS | Status: DC | PRN
  Administered 2023-07-08 (×3): 10 mg via INTRAVENOUS

## 2023-07-08 MED ORDER — LIDOCAINE HCL 1 % IJ SOLN
INTRAMUSCULAR | Status: DC | PRN
  Administered 2023-07-08: 40 mg via INTRADERMAL

## 2023-07-08 MED ORDER — DEXTROSE-SODIUM CHLORIDE 5-0.9 % IV SOLN: INTRAVENOUS | Status: DC

## 2023-07-08 MED ORDER — PROPOFOL 500 MG/50ML IV EMUL
INTRAVENOUS | Status: DC | PRN
  Administered 2023-07-08: 120 ug/kg/min via INTRAVENOUS

## 2023-07-08 MED ORDER — PROPOFOL 500 MG/50ML IV EMUL
INTRAVENOUS | Status: AC
  Filled 2023-07-08: qty 50

## 2023-07-08 MED ORDER — LACTATED RINGERS IV SOLN: INTRAVENOUS | Status: DC

## 2023-07-08 MED ORDER — CYANOCOBALAMIN 1000 MCG/ML IJ SOLN
1000.0000 ug | Freq: Every day | INTRAMUSCULAR | Status: DC
  Administered 2023-07-08 – 2023-07-12 (×5): 1000 ug via INTRAMUSCULAR
  Filled 2023-07-08 (×5): qty 1

## 2023-07-08 MED ORDER — ADULT MULTIVITAMIN W/MINERALS CH
1.0000 | ORAL_TABLET | Freq: Every day | ORAL | Status: DC
  Administered 2023-07-09 – 2023-07-12 (×4): 1 via ORAL
  Filled 2023-07-08 (×4): qty 1

## 2023-07-08 SURGICAL SUPPLY — 15 items
BLOCK BITE 60FR ADLT L/F BLUE (MISCELLANEOUS) ×2 IMPLANT
ELECT REM PT RETURN 9FT ADLT (ELECTROSURGICAL) IMPLANT
ELECTRODE REM PT RTRN 9FT ADLT (ELECTROSURGICAL) IMPLANT
FORCEP RJ3 GP 1.8X160 W-NEEDLE (CUTTING FORCEPS) IMPLANT
FORCEPS BIOP RAD 4 LRG CAP 4 (CUTTING FORCEPS) IMPLANT
NDL SCLEROTHERAPY 25GX240 (NEEDLE) IMPLANT
NEEDLE SCLEROTHERAPY 25GX240 (NEEDLE) IMPLANT
PROBE APC STR FIRE (PROBE) IMPLANT
PROBE INJECTION GOLD (MISCELLANEOUS)
PROBE INJECTION GOLD 7FR (MISCELLANEOUS) IMPLANT
SNARE SHORT THROW 13M SML OVAL (MISCELLANEOUS) IMPLANT
SYR 50ML LL SCALE MARK (SYRINGE) IMPLANT
TUBING ENDO SMARTCAP PENTAX (MISCELLANEOUS) ×4 IMPLANT
TUBING IRRIGATION ENDOGATOR (MISCELLANEOUS) ×2 IMPLANT
WATER STERILE IRR 1000ML POUR (IV SOLUTION) IMPLANT

## 2023-07-08 NOTE — Anesthesia Postprocedure Evaluation (Signed)
Anesthesia Post Note  Patient: Wendy [name]  Procedure(s) Performed: ESOPHAGOGASTRODUODENOSCOPY (EGD) WITH PROPOFOL     Patient location during evaluation: PACU Anesthesia Type: MAC Level of consciousness: awake Pain management: pain level controlled Vital Signs Assessment: post-procedure vital signs reviewed and stable Respiratory status: spontaneous breathing, nonlabored ventilation and respiratory function stable Cardiovascular status: stable and blood pressure returned to baseline Postop Assessment: no apparent nausea or vomiting Anesthetic complications: no   No notable events documented.  Last Vitals:  Vitals:   07/08/23 1317 07/08/23 1322  BP: (!) 107/36 (!) 112/47  Pulse: 63 68  Resp: (!) 21 14  Temp:    SpO2: 100% 100%    Last Pain:  Vitals:   07/08/23 1322  TempSrc:   PainSc: 0-No pain                 Linton Rump

## 2023-07-08 NOTE — Progress Notes (Signed)
   07/08/23 1436  TOC Brief Assessment  Insurance and Status Reviewed  Patient has primary care physician Yes  Home environment has been reviewed Yes  Prior level of function: Independent  Prior/Current Home Services No current home services  Social Determinants of Health Reivew SDOH reviewed no interventions necessary  Readmission risk has been reviewed Yes  Transition of care needs no transition of care needs at this time   Brief assessment complete. No TOC needs noted at this time.

## 2023-07-08 NOTE — Transfer of Care (Signed)
Immediate Anesthesia Transfer of Care Note  Patient: Wendy [name]  Procedure(s) Performed: ESOPHAGOGASTRODUODENOSCOPY (EGD) WITH PROPOFOL  Patient Location: PACU and Endoscopy Unit  Anesthesia Type:MAC  Level of Consciousness: awake, alert , oriented, and patient cooperative  Airway & Oxygen Therapy: Patient Spontanous Breathing and Patient connected to face mask oxygen  Post-op Assessment: Report given to RN and Post -op Vital signs reviewed and stable  Post vital signs: Reviewed and stable  Last Vitals:  Vitals Value Taken Time  BP 107/36 07/08/23 1316  Temp    Pulse 74 07/08/23 1317  Resp 22 07/08/23 1317  SpO2 100 % 07/08/23 1317  Vitals shown include unvalidated device data.  Last Pain:  Vitals:   07/08/23 1309  TempSrc: (P) Temporal  PainSc: (P) 0-No pain         Complications: No notable events documented.

## 2023-07-08 NOTE — Anesthesia Preprocedure Evaluation (Addendum)
Anesthesia Evaluation  Patient identified by MRN, date of birth, ID band Patient awake    Reviewed: Allergy & Precautions, NPO status , Patient's Chart, lab work & pertinent test results  History of Anesthesia Complications Negative for: history of anesthetic complications  Airway Mallampati: II  TM Distance: >3 FB Neck ROM: Full    Dental  (+) Dental Advisory Given   Pulmonary neg pulmonary ROS   Pulmonary exam normal breath sounds clear to auscultation       Cardiovascular hypertension (benazepril, HCTZ), Pt. on medications (-) angina (-) Past MI, (-) Cardiac Stents and (-) CABG + dysrhythmias (PACs) Atrial Fibrillation + Valvular Problems/Murmurs (mild-to-moderate MR, moderate TR)  Rhythm:Irregular Rate:Normal  HLD  TTE 10/14/2019: IMPRESSIONS     1. Left ventricular ejection fraction, by visual estimation, is 55 to  60%. The left ventricle has normal function. There is no left ventricular  hypertrophy.   2. Global right ventricle has normal systolic function.The right  ventricular size is normal. No increase in right ventricular wall  thickness.   3. Left atrial size was mildly dilated.   4. Right atrial size was mildly dilated.   5. Moderate calcification of the mitral valve leaflet(s).   6. Moderate mitral annular calcification.   7. Moderate thickening of the mitral valve leaflet(s).   8. The mitral valve is degenerative. Mild to moderate mitral valve  regurgitation.   9. MR not well interrogated Restricted posterior leaflet motion with  posteriorly directed jet can consider TEE to further evaluate.  10. The tricuspid valve is normal in structure. Tricuspid valve  regurgitation moderate.  11. The aortic valve is tricuspid Aortic valve regurgitation is mild to  moderate by color flow Doppler.  12. There is Moderate calcification of the aortic valve.  13. There is Moderate thickening of the aortic valve.  14. The  pulmonic valve was grossly normal. Pulmonic valve regurgitation is  mild by color flow Doppler.  15. Aortic dilatation noted.  16. There is mild dilatation of the aortic root measuring 38 mm.  17. Moderately elevated pulmonary artery systolic pressure.     Neuro/Psych negative neurological ROS     GI/Hepatic Neg liver ROS,neg GERD  ,,Pancreatic mass   Endo/Other  negative endocrine ROS    Renal/GU negative Renal ROS     Musculoskeletal   Abdominal   Peds  Hematology  (+) Blood dyscrasia (Hgb 7.6), anemia   Anesthesia Other Findings 89 year old Caucasian female with a history of hypertension, hyperlipidemia, atrial fibrillation on Xarelto admitted to the hospital with iron deficiency anemia and pancreatic mass.  Last Xarelto: 7/6  Reproductive/Obstetrics                             Anesthesia Physical Anesthesia Plan  ASA: 3  Anesthesia Plan: MAC   Post-op Pain Management:    Induction: Intravenous  PONV Risk Score and Plan: 2 and Propofol infusion and Treatment may vary due to age or medical condition  Airway Management Planned: Natural Airway and Nasal Cannula  Additional Equipment:   Intra-op Plan:   Post-operative Plan:   Informed Consent: I have reviewed the patients History and Physical, chart, labs and discussed the procedure including the risks, benefits and alternatives for the proposed anesthesia with the patient or authorized representative who has indicated his/her understanding and acceptance.     Dental advisory given  Plan Discussed with: CRNA and Anesthesiologist  Anesthesia Plan Comments: (Discussed with patient risks  of MAC including, but not limited to, minor pain or discomfort, hearing people in the room, and possible need for backup general anesthesia. Risks for general anesthesia also discussed including, but not limited to, sore throat, hoarse voice, chipped/damaged teeth, injury to vocal cords, nausea and  vomiting, allergic reactions, lung infection, heart attack, stroke, and death. All questions answered. )        Anesthesia Quick Evaluation

## 2023-07-08 NOTE — Op Note (Signed)
[facility] Patient Name: Wendy [name] Procedure Date: 07/08/2023 MRN: [id number] Attending MD: Vida Rigger , MD, [id number] Date of Birth: [birth year] CSN: [id number] Age: 89 Admit Type: Inpatient Procedure:                Upper GI endoscopy Indications:              Abnormal CT of the GI tract with probable                            pancreatic mass and gastric outlet obstruction Providers:                Vida Rigger, MD, Martha Clan, RN, Beryle Beams, Technician, Marlena Clipper, CRNA Referring MD:              Medicines:                Monitored Anesthesia Care Complications:            No immediate complications. Estimated Blood Loss:     Estimated blood loss: none. Procedure:                Pre-Anesthesia Assessment:                           - Prior to the procedure, a History and Physical                            was performed, and patient medications and                            allergies were reviewed. The patient's tolerance of                            previous anesthesia was also reviewed. The risks                            and benefits of the procedure and the sedation                            options and risks were discussed with the patient.                            All questions were answered, and informed consent                            was obtained. Prior Anticoagulants: The patient has                            taken Xarelto (rivaroxaban), last dose was 3 days                            prior to procedure. ASA Grade Assessment: II - A  patient with mild systemic disease. After reviewing                            the risks and benefits, the patient was deemed in                            satisfactory condition to undergo the procedure.                           After obtaining informed consent, the endoscope was                            passed under direct vision.  Throughout the                            procedure, the patient's blood pressure, pulse, and                            oxygen saturations were monitored continuously. The                            [id number]-H190 ([id number]) Olympus endoscope was introduced                            through the mouth, and advanced to the C-loop in                            the second part of duodenum. We could see the                            strictured area in the lumen but could not advance                            any further due to scope length so we switched to                            the the [id number]-[id number] ([id number]) Olympus slim                            colonoscope was introduced through the mouth and                            advanced to the similar area however could not find                            the lumen on multiple attempts so we elected to                            switch back to the regular upper scope and proceed                            with biopsies as below. The upper GI endoscopy was  technically difficult and complex due to abnormal                            anatomy. The patient tolerated the procedure well.                            Dr. Meridee Score was present and ready to proceed                            with an EUS but he was unable to proceed based on                            the amount of food in the stomach Scope In: Scope Out: Findings:      The larynx was normal.      The examined esophagus was normal.      A large amount of food (residue) was found in the gastric body.      The duodenal bulb was normal.      A severe extrinsic deformity was found in the first portion of the       duodenum. Biopsies were taken with a cold forceps for histology.      The exam was otherwise without abnormality. Impression:               - Normal larynx.                           - Normal esophagus.                           - A large amount of food  (residue) in the stomach.                           - Normal duodenal bulb.                           - Duodenal deformity. Biopsied.                           - The examination was otherwise normal.                            Unfortunately we had a picture mechanism                            malfunction and do not have any photodocumentation                            of this procedure Moderate Sedation:      Not Applicable - Patient had care per Anesthesia. Recommendation:           - Clear liquid diet today and probably until                            decision is made how to proceed.                           -  Continue present medications.                           - Await pathology results.                           - Return to GI clinic PRN.                           - Telephone GI clinic if symptomatic PRN.                           - Refer to a surgeon today for consideration of                            gastric bypass versus possible stenting in the                            future.                           - Perform an upper endoscopic ultrasound (UEUS) at                            appointment to be scheduled in a few days if my                            pathology is nondiagnostic. Procedure Code(s):        --- Professional ---                           [phone], Esophagogastroduodenoscopy, flexible,                            transoral; with biopsy, single or multiple Diagnosis Code(s):        --- Professional ---                           K31.89, Other diseases of stomach and duodenum                           R93.3, Abnormal findings on diagnostic imaging of                            other parts of digestive tract CPT copyright 2022 [employer]. All rights reserved. The codes documented in this report are preliminary and upon coder review may  be revised to meet current compliance requirements. Vida Rigger, MD 07/08/2023 1:37:33 PM This report has been  signed electronically. Number of Addenda: 0

## 2023-07-08 NOTE — Progress Notes (Signed)
Wendy [name] 12:07 PM  Subjective: Patient doing better today without any pain and we not only rediscussed our procedure but talked about an EUS since Dr. 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

## 2023-07-08 NOTE — Progress Notes (Signed)
PROGRESS NOTE    Wendy [name]  MVH:[id number] DOB: September 03, 1934 DOA: 07/07/2023 PCP: [name], MD   Brief Narrative: 89 year old with past medical history significant for A-fib, hypertension, hyperlipidemia presents with nausea, vomiting, constipation, found to have iron deficiency anemia, new pancreatic mass.   Assessment & Plan:   Principal Problem:   Pancreatic mass  1-Heterogenous Pancreatic Head Mass:  -Patient presented with nausea and constipation. -CT abdomen and pelvis showed 5.3 cm heterogeneously  enhancing expansile mass within the head of the pancreas situated within the pancreaticoduodenal groove likely invading the second portion of the duodenum  abutting  the terminal superior mesenteric vein without invasion and deviation of the main biliary and pancreatic ductal structures. -Underwent upper GI endoscopy 7/9; show large amount of fluid in the stomach, normal duodenal bulb.  Duodenal deformity.  Biopsy.   -Plan for clear liquid diet today await pathology results  Hypokalemia:  Replace orally  Hyponatremia:  Continue with IV fluids  B-12 deficiency:  Started on B12 intramuscular injection  Iron deficiency anemia:  She will need IV iron Monitor hemoglobin  HTN: Hold hydrochlorothiazide in the setting of poor oral intake 103/61 Monitor BP.    A fib Continue to hold xarelto.   Constipation; will consider dulcolax supp tomorrow.       Estimated body mass index is 18.92 kg/m as calculated from the following:   Height as of this encounter: 5' 6.5" (1.689 m).   Weight as of this encounter: 54 kg.   DVT prophylaxis: SCD Code Status: Full code Family Communication: family at bedside Disposition Plan:  Status is: Observation The patient will require care spanning > 2 midnights and should be moved to inpatient because: management of pancreatic mass, poor oral intake.     Consultants:  GI  Procedures:  Endoscopy 7/09  Antimicrobials:     Subjective: She is alert, denies pain currently. Just came from endoscopy. She has been having nausea, not eating much. Has not had BM.    Objective: Vitals:   07/07/23 1847 07/07/23 2014 07/08/23 0029 07/08/23 0513  BP: (!) 119/59 (!) 126/59 (!) 112/51 118/63  Pulse: 86 73 78 70  Resp: 16 18 17 18   Temp: 98.4 F (36.9 C) 98.3 F (36.8 C) 98.6 F (37 C) 98.6 F (37 C)  TempSrc:  Oral  Oral  SpO2: 100% 100% 100% 100%  Weight:      Height:        Intake/Output Summary (Last 24 hours) at 07/08/2023 8413 Last data filed at 07/07/2023 2010 Gross per 24 hour  Intake 860 ml  Output --  Net 860 ml   Filed Weights   07/07/23 1021  Weight: 54 kg    Examination:  General exam: Appears calm and comfortable  Respiratory system: Clear to auscultation. Respiratory effort normal. Cardiovascular system: S1 & S2 heard, RRR. No JVD, murmurs, rubs, gallops or clicks. No pedal edema. Gastrointestinal system: Abdomen is nondistended, soft and nontender. Central nervous system: Alert and oriented.  Extremities: Symmetric 5 x 5 power.    Data Reviewed: I have personally reviewed following labs and imaging studies  CBC: Recent Labs  Lab 07/07/23 1038 07/08/23 0359  WBC 5.2 4.3  NEUTROABS 3.5  --   HGB 7.0* 7.6*  HCT 22.2* 23.8*  MCV 98.2 97.1  PLT 283 213   Basic Metabolic Panel: Recent Labs  Lab 07/07/23 1038 07/08/23 0359  NA 135 133*  K 2.8* 3.4*  CL 97* 101  CO2 29  23  GLUCOSE 141* 102*  BUN 33* 26*  CREATININE 0.91 0.79  CALCIUM 10.7* 9.1   GFR: Estimated Creatinine Clearance: 43 mL/min (by C-G formula based on SCr of 0.79 mg/dL). Liver Function Tests: Recent Labs  Lab 07/07/23 1038  AST 26  ALT 18  ALKPHOS 52  BILITOT 0.5  PROT 6.6  ALBUMIN 3.8   Recent Labs  Lab 07/07/23 1038  LIPASE 87*   No results for input(s): "AMMONIA" in the last 168 hours. Coagulation Profile: No results for input(s): "INR", "PROTIME" in the last 168 hours. Cardiac  Enzymes: No results for input(s): "CKTOTAL", "CKMB", "CKMBINDEX", "TROPONINI" in the last 168 hours. BNP (last 3 results) No results for input(s): "PROBNP" in the last 8760 hours. HbA1C: No results for input(s): "HGBA1C" in the last 72 hours. CBG: No results for input(s): "GLUCAP" in the last 168 hours. Lipid Profile: No results for input(s): "CHOL", "HDL", "LDLCALC", "TRIG", "CHOLHDL", "LDLDIRECT" in the last 72 hours. Thyroid Function Tests: No results for input(s): "TSH", "T4TOTAL", "FREET4", "T3FREE", "THYROIDAB" in the last 72 hours. Anemia Panel: Recent Labs    07/07/23 1246  VITAMINB12 150*  FOLATE 8.9  FERRITIN 10*  TIBC 316  IRON 16*  RETICCTPCT 3.8*   Sepsis Labs: No results for input(s): "PROCALCITON", "LATICACIDVEN" in the last 168 hours.  Recent Results (from the past 240 hour(s))  SARS Coronavirus 2 by RT PCR (hospital order, performed in [facility] hospital lab) *cepheid single result test* Anterior Nasal Swab     Status: None   Collection Time: 07/07/23 11:00 AM   Specimen: Anterior Nasal Swab  Result Value Ref Range Status   SARS Coronavirus 2 by RT PCR NEGATIVE NEGATIVE Final    Comment: (NOTE) SARS-CoV-2 target nucleic acids are NOT DETECTED.  The SARS-CoV-2 RNA is generally detectable in upper and lower respiratory specimens during the acute phase of infection. The lowest concentration of SARS-CoV-2 viral copies this assay can detect is 250 copies / mL. A negative result does not preclude SARS-CoV-2 infection and should not be used as the sole basis for treatment or other patient management decisions.  A negative result may occur with improper specimen collection / handling, submission of specimen other than nasopharyngeal swab, presence of viral mutation(s) within the areas targeted by this assay, and inadequate number of viral copies (<250 copies / mL). A negative result must be combined with clinical observations, patient history, and  epidemiological information.  Fact Sheet for Patients:   RoadLapTop.co.za  Fact Sheet for Healthcare Providers: http://kim-miller.com/  This test is not yet approved or  cleared by the [place] FDA and has been authorized for detection and/or diagnosis of SARS-CoV-2 by FDA under an Emergency Use Authorization (EUA).  This EUA will remain in effect (meaning this test can be used) for the duration of the COVID-19 declaration under Section 564(b)(1) of the Act, 21 U.S.C. section 360bbb-3(b)(1), unless the authorization is terminated or revoked sooner.  Performed at [facility], [address], [place], [place] [address]          Radiology Studies: CT ABDOMEN PELVIS W CONTRAST  Result Date: 07/07/2023 CLINICAL DATA:  Abdominal pain, acute, nonlocalized EXAM: CT ABDOMEN AND PELVIS WITH CONTRAST TECHNIQUE: Multidetector CT imaging of the abdomen and pelvis was performed using the standard protocol following bolus administration of intravenous contrast. RADIATION DOSE REDUCTION: This exam was performed according to the departmental dose-optimization program which includes automated exposure control, adjustment of the mA and/or kV according to patient size  and/or use of iterative reconstruction technique. CONTRAST:  75mL OMNIPAQUE IOHEXOL 300 MG/ML  SOLN COMPARISON:  None Available. FINDINGS: Lower chest: Visualized lung bases are clear. Mild cardiomegaly. No acute abnormality. Hepatobiliary: Mild hepatic steatosis. No enhancing intrahepatic mass. No intra or extrahepatic biliary ductal dilation. Gallbladder unremarkable. Pancreas: There is a heterogeneously enhancing expansile mass within the head of the pancreas measuring at least 3.9 x 5.3 by 3.4 cm in dimension, situated within the pancreatico duodenal groove likely invading the second portion of the duodenum, best seen on image # 30/2, and abutting the terminal superior  mesenteric vein without invasion or encasement, best seen on image # 30-32/2 and coronal image # 44/8. The main pancreatic duct is deviated superiorly, as is the distal common duct, but does not appear dilated. Focal pancreatitis is a consideration though is considered less likely given the lack of significant surrounding inflammatory stranding and apparent invasion of the second portion of the duodenum. Single enlarged periportal lymph node is seen at axial image # 24/2 measuring 11 mm in short axis diameter. Normal enhancement and preserved parenchyma involving of the body and tail the pancreas. No peripancreatic fluid collections. Spleen: Unremarkable Adrenals/Urinary Tract: The adrenal glands are unremarkable. The kidneys are normal in size and position. Punctate 1-2 mm nonobstructing calculi are seen within the kidneys bilaterally. No ureteral calculi. No hydronephrosis. No enhancing intrarenal masses. Simple cortical cyst within the interpolar region of the left kidney for which no follow-up imaging is recommended. The bladder is unremarkable. Stomach/Bowel: The stomach and proximal duodenum appears fluid-filled and distended suggesting at least partial obstruction related to the mass involving the second portion of the duodenum. The stomach, small bowel, and large bowel are otherwise unremarkable. The appendix is normal. No free intraperitoneal gas or fluid. Vascular/Lymphatic: Moderate aortoiliac atherosclerotic calcification. No aortic aneurysm. Superior mesenteric artery and common hepatic artery are separate from the above-mentioned pancreatic mass. No additional pathologic adenopathy within the abdomen and pelvis. Reproductive: Uterus and bilateral adnexa are unremarkable. Other: No abdominal wall hernia Musculoskeletal: Degenerative changes are seen within the lumbar spine and hips bilaterally. No acute bone abnormality. No lytic or blastic bone lesion. IMPRESSION: 1. 5.3 cm heterogeneously enhancing  expansile mass within the head of the pancreas, situated within the pancreatico duodenal groove likely invading the second portion of the duodenum and abutting the terminal superior mesenteric vein without invasion or encasement, and deviation of the main biliary and pancreatic ductal structures. Findings are concerning for pancreatic adenocarcinoma. While focal pancreatitis is a consideration, this is considered less likely given the evidence of mass effect and mural invasion. Correlation with CA 19-9 level as well as endoscopic ultrasound and tissue sampling is recommended. 2. Fluid-filled and distended stomach and proximal duodenum suggesting at least partial obstruction related to the mass involving the second portion of the duodenum. 3. Mild hepatic steatosis. 4. Bilateral minimal nonobstructing nephrolithiasis. 5. Mild cardiomegaly. Aortic Atherosclerosis (ICD10-I70.0). Electronically Signed   By: Helyn Numbers M.D.   On: 07/07/2023 14:09   DG Chest 2 View  Result Date: 07/07/2023 CLINICAL DATA:  Dyspnea with exertion. EXAM: CHEST - 2 VIEW COMPARISON:  None Available. FINDINGS: Mild cardiomegaly. Both lungs are clear. The visualized skeletal structures are unremarkable. IMPRESSION: No active cardiopulmonary disease. Aortic Atherosclerosis (ICD10-I70.0). Electronically Signed   By: Lupita Raider M.D.   On: 07/07/2023 11:37        Scheduled Meds:  sodium chloride   Intravenous Once   cyanocobalamin  1,000 mcg Intramuscular Daily  docusate sodium  100 mg Oral BID   enoxaparin (LOVENOX) injection  40 mg Subcutaneous Q24H   Continuous Infusions:   LOS: 0 days    Time spent: 35 minutes    Hiromi Knodel A Nielle Duford, MD [facility]   If 7PM-7AM, please contact night-coverage [email or web address]  07/08/2023, 8:21 AM

## 2023-07-08 NOTE — Progress Notes (Signed)
Initial Nutrition Assessment  DOCUMENTATION CODES:   Non-severe (moderate) malnutrition in context of chronic illness  INTERVENTION:  - Clear Liquid diet.  - Recommend Ensure Plus High Protein po BID once diet advanced. Each supplement provides 350 kcal and 20 grams of protein. - Encourage intake as tolerated.  - Multivitamin with minerals daily. - Monitor weight trends.    NUTRITION DIAGNOSIS:   Moderate Malnutrition related to chronic illness as evidenced by mild fat depletion, moderate muscle depletion.  GOAL:   Patient will meet greater than or equal to 90% of their needs  MONITOR:   PO intake, Supplement acceptance, Diet advancement, Weight trends  REASON FOR ASSESSMENT:   Malnutrition Screening Tool    ASSESSMENT:   89 y.o. female with PMH significant for HTN and HLD who was admiitted with vomiting, constipation, iron deficiency anemia and new finding of pancreatic mass.  Patient endorses a UBW of 127# and weight loss beginning around 4 months ago. Notes that it has escalated over the past 1 month. Per EMR, no weight history since February 2023 to assess recent changes. Admit weight of 119# suggests weight loss from UBW.   She reports her eating has been up and down the past few months. Will sometimes eat very well and others eat poorly. Notes that before she started eating less she never had 3 large meals a day, usually only around 2 small ones. The past few weeks she has eaten even less due to nausea and diarrhea. Appetite has also been poor.   Patient on clear liquids yesterday and family member at beside reports she was able to eat some chicken broth, jello, and ginger ale.  Patient currently NPO for scheduled Upper GI today. She is agreeable to try Ensure once diet advanced.   Medications reviewed and include: vitamin B12, Colace, D5 @ 44mL/hr (provides 204 kcals over 24 hours)  Labs reviewed:  Na 133 K+ 3.4   NUTRITION - FOCUSED PHYSICAL  EXAM:  Flowsheet Row Most Recent Value  Orbital Region No depletion  Upper Arm Region Mild depletion  Thoracic and Lumbar Region Mild depletion  Buccal Region Mild depletion  Temple Region Moderate depletion  Clavicle Bone Region Severe depletion  Clavicle and Acromion Bone Region Moderate depletion  Scapular Bone Region Unable to assess  Dorsal Hand No depletion  Patellar Region Mild depletion  Anterior Thigh Region Mild depletion  Posterior Calf Region Moderate depletion  Edema (RD Assessment) None  Hair Reviewed  Eyes Reviewed  Mouth Reviewed  Skin Reviewed  Nails Reviewed       Diet Order:   Diet Order             Diet clear liquid Fluid consistency: Thin  Diet effective now                   EDUCATION NEEDS:  Education needs have been addressed  Skin:  Skin Assessment: Reviewed RN Assessment  Last BM:  unknown  Height:  Ht Readings from Last 1 Encounters:  07/08/23 5\' 6"  (1.676 m)   Weight:  Wt Readings from Last 1 Encounters:  07/08/23 54 kg    BMI:  Body mass index is 19.21 kg/m.  Estimated Nutritional Needs:  Kcal:  1600-1800 kcals Protein:  70-80 grams Fluid:  >/= 1.6L    Shelle Iron RD, LDN For contact information, refer to [facility].

## 2023-07-09 DIAGNOSIS — K8689 Other specified diseases of pancreas: Secondary | ICD-10-CM | POA: Diagnosis not present

## 2023-07-09 LAB — URINE CULTURE: Culture: 30000 — AB

## 2023-07-09 LAB — BASIC METABOLIC PANEL
Anion gap: 6 (ref 5–15)
BUN: 16 mg/dL (ref 8–23)
CO2: 26 mmol/L (ref 22–32)
Calcium: 8.8 mg/dL — ABNORMAL LOW (ref 8.9–10.3)
Chloride: 104 mmol/L (ref 98–111)
Creatinine, Ser: 0.67 mg/dL (ref 0.44–1.00)
GFR, Estimated: >60 mL/min (ref >60)
Glucose, Bld: 105 mg/dL — ABNORMAL HIGH (ref 70–99)
Potassium: 3.4 mmol/L — ABNORMAL LOW (ref 3.5–5.1)
Sodium: 136 mmol/L (ref 135–145)

## 2023-07-09 LAB — CBC
HCT: 23.8 % — ABNORMAL LOW (ref 36.0–46.0)
Hemoglobin: 7.6 g/dL — ABNORMAL LOW (ref 12.0–15.0)
MCH: 30.4 pg (ref 26.0–34.0)
MCHC: 31.9 g/dL (ref 30.0–36.0)
MCV: 95.2 fL (ref 80.0–100.0)
Platelets: 219 10*3/uL (ref 150–400)
RBC: 2.5 MIL/uL — ABNORMAL LOW (ref 3.87–5.11)
RDW: 13.4 % (ref 11.5–15.5)
WBC: 3.5 10*3/uL — ABNORMAL LOW (ref 4.0–10.5)
nRBC: 0 % (ref 0.0–0.2)

## 2023-07-09 MED ORDER — PANTOPRAZOLE SODIUM 40 MG PO TBEC
40.0000 mg | DELAYED_RELEASE_TABLET | Freq: Every day | ORAL | Status: DC
  Administered 2023-07-09 – 2023-07-12 (×4): 40 mg via ORAL
  Filled 2023-07-09 (×4): qty 1

## 2023-07-09 MED ORDER — BISACODYL 10 MG RE SUPP
10.0000 mg | Freq: Once | RECTAL | Status: AC
  Administered 2023-07-09: 10 mg via RECTAL
  Filled 2023-07-09: qty 1

## 2023-07-09 MED ORDER — POTASSIUM CHLORIDE CRYS ER 20 MEQ PO TBCR
40.0000 meq | EXTENDED_RELEASE_TABLET | Freq: Once | ORAL | Status: AC
  Administered 2023-07-09: 40 meq via ORAL
  Filled 2023-07-09: qty 2

## 2023-07-09 MED ORDER — SODIUM CHLORIDE 0.9 % IV SOLN
250.0000 mg | Freq: Once | INTRAVENOUS | Status: AC
  Administered 2023-07-09: 250 mg via INTRAVENOUS
  Filled 2023-07-09: qty 20

## 2023-07-09 MED ORDER — PROCHLORPERAZINE EDISYLATE 10 MG/2ML IJ SOLN
5.0000 mg | Freq: Four times a day (QID) | INTRAMUSCULAR | Status: DC | PRN
  Administered 2023-07-09: 5 mg via INTRAVENOUS
  Filled 2023-07-09: qty 2

## 2023-07-09 NOTE — Anesthesia Preprocedure Evaluation (Signed)
Anesthesia Evaluation  Patient identified by MRN, date of birth, ID band Patient awake    Reviewed: Allergy & Precautions, NPO status , Patient's Chart, lab work & pertinent test results  History of Anesthesia Complications Negative for: history of anesthetic complications  Airway Mallampati: II  TM Distance: >3 FB Neck ROM: Full    Dental  (+) Dental Advisory Given   Pulmonary neg pulmonary ROS   Pulmonary exam normal        Cardiovascular hypertension, Pt. on medications (-) angina (-) Past MI, (-) Cardiac Stents and (-) CABG + dysrhythmias (PACs) Atrial Fibrillation + Valvular Problems/Murmurs (mild-to-moderate MR, moderate TR)  Rhythm:Irregular  HLD  TTE 10/14/2019: IMPRESSIONS     1. Left ventricular ejection fraction, by visual estimation, is 55 to  60%. The left ventricle has normal function. There is no left ventricular  hypertrophy.   2. Global right ventricle has normal systolic function.The right  ventricular size is normal. No increase in right ventricular wall  thickness.   3. Left atrial size was mildly dilated.   4. Right atrial size was mildly dilated.   5. Moderate calcification of the mitral valve leaflet(s).   6. Moderate mitral annular calcification.   7. Moderate thickening of the mitral valve leaflet(s).   8. The mitral valve is degenerative. Mild to moderate mitral valve  regurgitation.   9. MR not well interrogated Restricted posterior leaflet motion with  posteriorly directed jet can consider TEE to further evaluate.  10. The tricuspid valve is normal in structure. Tricuspid valve  regurgitation moderate.  11. The aortic valve is tricuspid Aortic valve regurgitation is mild to  moderate by color flow Doppler.  12. There is Moderate calcification of the aortic valve.  13. There is Moderate thickening of the aortic valve.  14. The pulmonic valve was grossly normal. Pulmonic valve regurgitation is   mild by color flow Doppler.  15. Aortic dilatation noted.  16. There is mild dilatation of the aortic root measuring 38 mm.  17. Moderately elevated pulmonary artery systolic pressure.     Neuro/Psych negative neurological ROS     GI/Hepatic Neg liver ROS,neg GERD  ,,Pancreatic mass   Endo/Other  negative endocrine ROS    Renal/GU negative Renal ROS     Musculoskeletal   Abdominal   Peds  Hematology  (+) Blood dyscrasia (Hgb 7.6), anemia   Anesthesia Other Findings All: Bactrim  Reproductive/Obstetrics                             Anesthesia Physical Anesthesia Plan  ASA: 2  Anesthesia Plan: MAC   Post-op Pain Management: Minimal or no pain anticipated   Induction: Intravenous  PONV Risk Score and Plan: 2 and Propofol infusion and Treatment may vary due to age or medical condition  Airway Management Planned: Natural Airway and Nasal Cannula  Additional Equipment: None  Intra-op Plan:   Post-operative Plan:   Informed Consent: I have reviewed the patients History and Physical, chart, labs and discussed the procedure including the risks, benefits and alternatives for the proposed anesthesia with the patient or authorized representative who has indicated his/her understanding and acceptance.     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

## 2023-07-09 NOTE — Progress Notes (Signed)
PROGRESS NOTE    Wendy [name]  ZOX:[id number] DOB: [birth year] DOA: 07/07/2023 PCP: [name], MD   Brief Narrative: 89 year old with past medical history significant for A-fib, hypertension, hyperlipidemia presents with nausea, vomiting, constipation, found to have iron deficiency anemia, new pancreatic mass.   Assessment & Plan:   Principal Problem:   Pancreatic mass Active Problems:   Malnutrition of moderate degree  1-Heterogenous Pancreatic Head Mass:  Duodenal Obstruction.  -Patient presented with nausea and constipation. -CT abdomen and pelvis showed 5.3 cm heterogeneously  enhancing expansile mass within the head of the pancreas situated within the pancreaticoduodenal groove likely invading the second portion of the duodenum  abutting  the terminal superior mesenteric vein without invasion and deviation of the main biliary and pancreatic ductal structures. -Underwent upper GI endoscopy 7/9; show large amount of fluid in the stomach, normal duodenal bulb.  Duodenal deformity.  Biopsy.   -Plan for clear liquid diet  await pathology results -she will need duodenal stent and or EUS if Bx non diagnostic.    Hypokalemia:  Replace orally  Hyponatremia:  Continue with IV fluids  B-12 deficiency:  Started on B12 intramuscular injection  Iron deficiency anemia:  IV iron Ordered.  Monitor hemoglobin  HTN: Hold hydrochlorothiazide in the setting of poor oral intake 120/68 Monitor BP.    A fib Continue to hold xarelto.   Constipation; Dulcolax today.   Nutrition;  Check Albumin , pre albumin in am.     Estimated body mass index is 19.21 kg/m as calculated from the following:   Height as of this encounter: 5\' 6"  (1.676 m).   Weight as of this encounter: 54 kg.   DVT prophylaxis: SCD Code Status: Full code Family Communication: family at bedside Disposition Plan:  Status is: Observation The patient will require care spanning > 2 midnights and should be  moved to inpatient because: management of pancreatic mass, poor oral intake.     Consultants:  GI  Procedures:  Endoscopy 7/09  Antimicrobials:    Subjective: She report abdominal cramping pain. She feel she needs to have Bowel movement.    Objective: Vitals:   07/08/23 1400 07/08/23 1537 07/08/23 1934 07/09/23 0438  BP: (!) 128/45 103/61 (!) 125/54 120/68  Pulse: 71 87 75 82  Resp: 13 16 18 16   Temp:  97.8 F (36.6 C) 97.9 F (36.6 C) 97.7 F (36.5 C)  TempSrc:   Oral Oral  SpO2: 100% 100% 91% 99%  Weight:      Height:        Intake/Output Summary (Last 24 hours) at 07/09/2023 0957 Last data filed at 07/08/2023 2100 Gross per 24 hour  Intake 781.12 ml  Output 3 ml  Net 778.12 ml    Filed Weights   07/07/23 1021 07/08/23 1140  Weight: 54 kg 54 kg    Examination:  General exam: NAD Respiratory system: CTA. Cardiovascular system: S 1, S 2 RRR Gastrointestinal system:BS present, soft, mild tender Central nervous system: Alert Extremities: no edema    Data Reviewed: I have personally reviewed following labs and imaging studies  CBC: Recent Labs  Lab 07/07/23 1038 07/08/23 0359 07/09/23 0431  WBC 5.2 4.3 3.5*  NEUTROABS 3.5  --   --   HGB 7.0* 7.6* 7.6*  HCT 22.2* 23.8* 23.8*  MCV 98.2 97.1 95.2  PLT 283 213 219    Basic Metabolic Panel: Recent Labs  Lab 07/07/23 1038 07/08/23 0359 07/09/23 0431  NA 135 133* 136  K 2.8* 3.4* 3.4*  CL 97* 101 104  CO2 29 23 26   GLUCOSE 141* 102* 105*  BUN 33* 26* 16  CREATININE 0.91 0.79 0.67  CALCIUM 10.7* 9.1 8.8*    GFR: Estimated Creatinine Clearance: 43 mL/min (by C-G formula based on SCr of 0.67 mg/dL). Liver Function Tests: Recent Labs  Lab 07/07/23 1038  AST 26  ALT 18  ALKPHOS 52  BILITOT 0.5  PROT 6.6  ALBUMIN 3.8    Recent Labs  Lab 07/07/23 1038  LIPASE 87*    No results for input(s): "AMMONIA" in the last 168 hours. Coagulation Profile: No results for input(s): "INR",  "PROTIME" in the last 168 hours. Cardiac Enzymes: No results for input(s): "CKTOTAL", "CKMB", "CKMBINDEX", "TROPONINI" in the last 168 hours. BNP (last 3 results) No results for input(s): "PROBNP" in the last 8760 hours. HbA1C: No results for input(s): "HGBA1C" in the last 72 hours. CBG: No results for input(s): "GLUCAP" in the last 168 hours. Lipid Profile: No results for input(s): "CHOL", "HDL", "LDLCALC", "TRIG", "CHOLHDL", "LDLDIRECT" in the last 72 hours. Thyroid Function Tests: No results for input(s): "TSH", "T4TOTAL", "FREET4", "T3FREE", "THYROIDAB" in the last 72 hours. Anemia Panel: Recent Labs    07/07/23 1246  VITAMINB12 150*  FOLATE 8.9  FERRITIN 10*  TIBC 316  IRON 16*  RETICCTPCT 3.8*    Sepsis Labs: No results for input(s): "PROCALCITON", "LATICACIDVEN" in the last 168 hours.  Recent Results (from the past 240 hour(s))  SARS Coronavirus 2 by RT PCR (hospital order, performed in [facility] hospital lab) *cepheid single result test* Anterior Nasal Swab     Status: None   Collection Time: 07/07/23 11:00 AM   Specimen: Anterior Nasal Swab  Result Value Ref Range Status   SARS Coronavirus 2 by RT PCR NEGATIVE NEGATIVE Final    Comment: (NOTE) SARS-CoV-2 target nucleic acids are NOT DETECTED.  The SARS-CoV-2 RNA is generally detectable in upper and lower respiratory specimens during the acute phase of infection. The lowest concentration of SARS-CoV-2 viral copies this assay can detect is 250 copies / mL. A negative result does not preclude SARS-CoV-2 infection and should not be used as the sole basis for treatment or other patient management decisions.  A negative result may occur with improper specimen collection / handling, submission of specimen other than nasopharyngeal swab, presence of viral mutation(s) within the areas targeted by this assay, and inadequate number of viral copies (<250 copies / mL). A negative result must be combined with  clinical observations, patient history, and epidemiological information.  Fact Sheet for Patients:   RoadLapTop.co.za  Fact Sheet for Healthcare Providers: http://kim-miller.com/  This test is not yet approved or  cleared by the [place] FDA and has been authorized for detection and/or diagnosis of SARS-CoV-2 by FDA under an Emergency Use Authorization (EUA).  This EUA will remain in effect (meaning this test can be used) for the duration of the COVID-19 declaration under Section 564(b)(1) of the Act, 21 U.S.C. section 360bbb-3(b)(1), unless the authorization is terminated or revoked sooner.  Performed at [facility], [address], [place], [place] [address]   Urine Culture     Status: Abnormal   Collection Time: 07/07/23  2:25 PM   Specimen: Urine, Clean Catch  Result Value Ref Range Status   Specimen Description   Final    URINE, CLEAN CATCH Performed at [facility], [address], [place], [place] [address]    Special Requests  Final    NONE Performed at [facility], [address], [place], [place] [address]    Culture 30,000 COLONIES/mL ESCHERICHIA COLI (A)  Final   Report Status 07/09/2023 FINAL  Final   Organism ID, Bacteria ESCHERICHIA COLI (A)  Final      Susceptibility   Escherichia coli - MIC*    AMPICILLIN <=2 SENSITIVE Sensitive     CEFAZOLIN <=4 SENSITIVE Sensitive     CEFEPIME <=0.12 SENSITIVE Sensitive     CEFTRIAXONE <=0.25 SENSITIVE Sensitive     CIPROFLOXACIN <=0.25 SENSITIVE Sensitive     GENTAMICIN <=1 SENSITIVE Sensitive     IMIPENEM <=0.25 SENSITIVE Sensitive     NITROFURANTOIN <=16 SENSITIVE Sensitive     TRIMETH/SULFA >=320 RESISTANT Resistant     AMPICILLIN/SULBACTAM <=2 SENSITIVE Sensitive     PIP/TAZO <=4 SENSITIVE Sensitive     * 30,000 COLONIES/mL ESCHERICHIA COLI         Radiology Studies: CT ABDOMEN PELVIS W  CONTRAST  Result Date: 07/07/2023 CLINICAL DATA:  Abdominal pain, acute, nonlocalized EXAM: CT ABDOMEN AND PELVIS WITH CONTRAST TECHNIQUE: Multidetector CT imaging of the abdomen and pelvis was performed using the standard protocol following bolus administration of intravenous contrast. RADIATION DOSE REDUCTION: This exam was performed according to the departmental dose-optimization program which includes automated exposure control, adjustment of the mA and/or kV according to patient size and/or use of iterative reconstruction technique. CONTRAST:  75mL OMNIPAQUE IOHEXOL 300 MG/ML  SOLN COMPARISON:  None Available. FINDINGS: Lower chest: Visualized lung bases are clear. Mild cardiomegaly. No acute abnormality. Hepatobiliary: Mild hepatic steatosis. No enhancing intrahepatic mass. No intra or extrahepatic biliary ductal dilation. Gallbladder unremarkable. Pancreas: There is a heterogeneously enhancing expansile mass within the head of the pancreas measuring at least 3.9 x 5.3 by 3.4 cm in dimension, situated within the pancreatico duodenal groove likely invading the second portion of the duodenum, best seen on image # 30/2, and abutting the terminal superior mesenteric vein without invasion or encasement, best seen on image # 30-32/2 and coronal image # 44/8. The main pancreatic duct is deviated superiorly, as is the distal common duct, but does not appear dilated. Focal pancreatitis is a consideration though is considered less likely given the lack of significant surrounding inflammatory stranding and apparent invasion of the second portion of the duodenum. Single enlarged periportal lymph node is seen at axial image # 24/2 measuring 11 mm in short axis diameter. Normal enhancement and preserved parenchyma involving of the body and tail the pancreas. No peripancreatic fluid collections. Spleen: Unremarkable Adrenals/Urinary Tract: The adrenal glands are unremarkable. The kidneys are normal in size and position.  Punctate 1-2 mm nonobstructing calculi are seen within the kidneys bilaterally. No ureteral calculi. No hydronephrosis. No enhancing intrarenal masses. Simple cortical cyst within the interpolar region of the left kidney for which no follow-up imaging is recommended. The bladder is unremarkable. Stomach/Bowel: The stomach and proximal duodenum appears fluid-filled and distended suggesting at least partial obstruction related to the mass involving the second portion of the duodenum. The stomach, small bowel, and large bowel are otherwise unremarkable. The appendix is normal. No free intraperitoneal gas or fluid. Vascular/Lymphatic: Moderate aortoiliac atherosclerotic calcification. No aortic aneurysm. Superior mesenteric artery and common hepatic artery are separate from the above-mentioned pancreatic mass. No additional pathologic adenopathy within the abdomen and pelvis. Reproductive: Uterus and bilateral adnexa are unremarkable. Other: No abdominal wall hernia Musculoskeletal: Degenerative changes are seen within the lumbar spine and hips bilaterally. No acute bone  abnormality. No lytic or blastic bone lesion. IMPRESSION: 1. 5.3 cm heterogeneously enhancing expansile mass within the head of the pancreas, situated within the pancreatico duodenal groove likely invading the second portion of the duodenum and abutting the terminal superior mesenteric vein without invasion or encasement, and deviation of the main biliary and pancreatic ductal structures. Findings are concerning for pancreatic adenocarcinoma. While focal pancreatitis is a consideration, this is considered less likely given the evidence of mass effect and mural invasion. Correlation with CA 19-9 level as well as endoscopic ultrasound and tissue sampling is recommended. 2. Fluid-filled and distended stomach and proximal duodenum suggesting at least partial obstruction related to the mass involving the second portion of the duodenum. 3. Mild hepatic  steatosis. 4. Bilateral minimal nonobstructing nephrolithiasis. 5. Mild cardiomegaly. Aortic Atherosclerosis (ICD10-I70.0). Electronically Signed   By: Helyn Numbers M.D.   On: 07/07/2023 14:09   DG Chest 2 View  Result Date: 07/07/2023 CLINICAL DATA:  Dyspnea with exertion. EXAM: CHEST - 2 VIEW COMPARISON:  None Available. FINDINGS: Mild cardiomegaly. Both lungs are clear. The visualized skeletal structures are unremarkable. IMPRESSION: No active cardiopulmonary disease. Aortic Atherosclerosis (ICD10-I70.0). Electronically Signed   By: Lupita Raider M.D.   On: 07/07/2023 11:37        Scheduled Meds:  sodium chloride   Intravenous Once   bisacodyl  10 mg Rectal Once   cyanocobalamin  1,000 mcg Intramuscular Daily   docusate sodium  100 mg Oral BID   multivitamin with minerals  1 tablet Oral Daily   pantoprazole  40 mg Oral Daily   Continuous Infusions:  sodium chloride 50 mL/hr at 07/08/23 1730   ferric gluconate (FERRLECIT) IVPB 250 mg (07/09/23 0814)   lactated ringers Stopped (07/08/23 1318)     LOS: 1 day    Time spent: 35 minutes    Taeshawn Helfman A Hrishikesh Hoeg, MD [facility]   If 7PM-7AM, please contact night-coverage [email or web address]  07/09/2023, 9:57 AM

## 2023-07-09 NOTE — Progress Notes (Signed)
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

## 2023-07-09 NOTE — Progress Notes (Signed)
The patient's biopsies returned as adenocarcinoma and we discussed the risk benefits methods and success rate of duodenal stent with the patient and her family and we will proceed tomorrow at 1030 and endoscopy so okay to hold off on TPN for now but would recommend an oncology consult and all the family's questions were answered

## 2023-07-09 NOTE — Consult Note (Signed)
Surgical Evaluation Requesting provider: Dr. Vida Rigger  Chief Complaint: pancreatic mass  HPI: This is an 89 year old woman with history of atrial fibrillation on Xarelto (last dose 7/5), hypertension, hyperlipidemia who was admitted to the hospital 2 days ago with a 10-day history of indigestion, constipation, decreased oral intake for the preceding 10 days, shortness of breath on exertion and a lot of weakness which had been progressively worsening.  She did have a UTI a month ago and has been on multiple rounds of antibiotics in the last month of this but did not have any urinary symptoms at the time of presentation.  Some diarrhea and abdominal discomfort followed antibiotics but the diarrhea has since resolved.  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 She was taken for endoscopy and attempted EUS on 7/9 by Dr. Ewing Schlein and Dr. Meridee Score, notable findings including a large amount of retained foodstuffs in the stomach, extrinsic deformity at the level of D1; biopsies were taken.  EUS was not feasible due to the amount of food in the stomach at the time. Prior to a month ago, the patient had no previous GI issues, was very active mowing her own yard and walking 4 to 6 miles per day.  Allergies  Allergen Reactions   Bactrim [Sulfamethoxazole-Trimethoprim] Nausea And Vomiting and Other (See Comments)    "Knocked her for a loop"- dizziness and weakness, also    Past Medical History:  Diagnosis Date   Hyperlipidemia    Hypertension    Vitamin D deficiency     History reviewed. No pertinent surgical history.  Family History  Problem Relation Age of Onset   Pancreatic cancer Father    Cancer Brother     Social History   Socioeconomic History   Marital status: Married    Spouse name: Not on file   Number of children: Not on file   Years of education: 1   Highest education level: Not on file  Occupational History   Not on file  Tobacco  Use   Smoking status: Never   Smokeless tobacco: Never  Substance and Sexual Activity   Alcohol use: No   Drug use: No   Sexual activity: Not on file  Other Topics Concern   Not on file  Social History Narrative   Not on file   Social Determinants of Health   Financial Resource Strain: Not on file  Food Insecurity: No Food Insecurity (07/07/2023)   Hunger Vital Sign    Worried About Running Out of Food in the Last Year: Never true    Ran Out of Food in the Last Year: Never true  Transportation Needs: No Transportation Needs (07/07/2023)   PRAPARE - Administrator, Civil Service (Medical): No    Lack of Transportation (Non-Medical): No  Physical Activity: Not on file  Stress: Not on file  Social Connections: Not on file    No current facility-administered medications on file prior to encounter.   Current Outpatient Medications on File Prior to Encounter  Medication Sig Dispense Refill   benazepril (LOTENSIN) 20 MG tablet Take 20 mg by mouth daily.     estradiol (ESTRACE) 0.1 MG/GM vaginal cream Place 1 Applicatorful vaginally at bedtime as needed (for dryness-related irritation).     hydrochlorothiazide (HYDRODIURIL) 25 MG tablet Take 25 mg by mouth daily.     simvastatin (ZOCOR) 40 MG tablet Take 20 mg by mouth daily with supper.     Cholecalciferol (VITAMIN  D3) 50 MCG (2000 UT) TABS Take 2,000 Units by mouth daily. (Patient not taking: Reported on 07/07/2023)     Omega-3 Fatty Acids (FISH OIL PO) Take 1 capsule by mouth daily with breakfast. (Patient not taking: Reported on 07/07/2023)     Rivaroxaban (XARELTO) 15 MG TABS tablet Take 1 tablet by mouth once daily (Patient not taking: Reported on 07/07/2023) 90 tablet 1    Review of Systems: a complete, 10pt review of systems was completed with pertinent positives and negatives as documented in the HPI  Physical Exam: Vitals:   07/08/23 1934 07/09/23 0438  BP: (!) 125/54 120/68  Pulse: 75 82  Resp: 18 16  Temp: 97.9  F (36.6 C) 97.7 F (36.5 C)  SpO2: 91% 99%   Gen: A&Ox3, no distress  Chest: respiratory effort is normal.  Gastrointestinal: soft, nondistended, nontender. Psych: appropriate mood and affect, normal insight/judgment intact  Skin: warm and dry      Latest Ref Rng & Units 07/09/2023    4:31 AM 07/08/2023    3:59 AM 07/07/2023   10:38 AM  CBC  WBC 4.0 - 10.5 K/uL 3.5  4.3  5.2   Hemoglobin 12.0 - 15.0 g/dL 7.6  7.6  7.0   Hematocrit 36.0 - 46.0 % 23.8  23.8  22.2   Platelets 150 - 400 K/uL 219  213  283        Latest Ref Rng & Units 07/09/2023    4:31 AM 07/08/2023    3:59 AM 07/07/2023   10:38 AM  CMP  Glucose 70 - 99 mg/dL 161  096  045   BUN 8 - 23 mg/dL 16  26  33   Creatinine 0.44 - 1.00 mg/dL 4.09  8.11  9.14   Sodium 135 - 145 mmol/L 136  133  135   Potassium 3.5 - 5.1 mmol/L 3.4  3.4  2.8   Chloride 98 - 111 mmol/L 104  101  97   CO2 22 - 32 mmol/L 26  23  29    Calcium 8.9 - 10.3 mg/dL 8.8  9.1  78.2   Total Protein 6.5 - 8.1 g/dL   6.6   Total Bilirubin 0.3 - 1.2 mg/dL   0.5   Alkaline Phos 38 - 126 U/L   52   AST 15 - 41 U/L   26   ALT 0 - 44 U/L   18     No results found for: "INR", "PROTIME"  Imaging: CT ABDOMEN PELVIS W CONTRAST  Result Date: 07/07/2023 CLINICAL DATA:  Abdominal pain, acute, nonlocalized EXAM: CT ABDOMEN AND PELVIS WITH CONTRAST TECHNIQUE: Multidetector CT imaging of the abdomen and pelvis was performed using the standard protocol following bolus administration of intravenous contrast. RADIATION DOSE REDUCTION: This exam was performed according to the departmental dose-optimization program which includes automated exposure control, adjustment of the mA and/or kV according to patient size and/or use of iterative reconstruction technique. CONTRAST:  75mL OMNIPAQUE IOHEXOL 300 MG/ML  SOLN COMPARISON:  None Available. FINDINGS: Lower chest: Visualized lung bases are clear. Mild cardiomegaly. No acute abnormality. Hepatobiliary: Mild hepatic steatosis.  No enhancing intrahepatic mass. No intra or extrahepatic biliary ductal dilation. Gallbladder unremarkable. Pancreas: There is a heterogeneously enhancing expansile mass within the head of the pancreas measuring at least 3.9 x 5.3 by 3.4 cm in dimension, situated within the pancreatico duodenal groove likely invading the second portion of the duodenum, best seen on image # 30/2, and abutting the terminal superior mesenteric  vein without invasion or encasement, best seen on image # 30-32/2 and coronal image # 44/8. The main pancreatic duct is deviated superiorly, as is the distal common duct, but does not appear dilated. Focal pancreatitis is a consideration though is considered less likely given the lack of significant surrounding inflammatory stranding and apparent invasion of the second portion of the duodenum. Single enlarged periportal lymph node is seen at axial image # 24/2 measuring 11 mm in short axis diameter. Normal enhancement and preserved parenchyma involving of the body and tail the pancreas. No peripancreatic fluid collections. Spleen: Unremarkable Adrenals/Urinary Tract: The adrenal glands are unremarkable. The kidneys are normal in size and position. Punctate 1-2 mm nonobstructing calculi are seen within the kidneys bilaterally. No ureteral calculi. No hydronephrosis. No enhancing intrarenal masses. Simple cortical cyst within the interpolar region of the left kidney for which no follow-up imaging is recommended. The bladder is unremarkable. Stomach/Bowel: The stomach and proximal duodenum appears fluid-filled and distended suggesting at least partial obstruction related to the mass involving the second portion of the duodenum. The stomach, small bowel, and large bowel are otherwise unremarkable. The appendix is normal. No free intraperitoneal gas or fluid. Vascular/Lymphatic: Moderate aortoiliac atherosclerotic calcification. No aortic aneurysm. Superior mesenteric artery and common hepatic artery  are separate from the above-mentioned pancreatic mass. No additional pathologic adenopathy within the abdomen and pelvis. Reproductive: Uterus and bilateral adnexa are unremarkable. Other: No abdominal wall hernia Musculoskeletal: Degenerative changes are seen within the lumbar spine and hips bilaterally. No acute bone abnormality. No lytic or blastic bone lesion. IMPRESSION: 1. 5.3 cm heterogeneously enhancing expansile mass within the head of the pancreas, situated within the pancreatico duodenal groove likely invading the second portion of the duodenum and abutting the terminal superior mesenteric vein without invasion or encasement, and deviation of the main biliary and pancreatic ductal structures. Findings are concerning for pancreatic adenocarcinoma. While focal pancreatitis is a consideration, this is considered less likely given the evidence of mass effect and mural invasion. Correlation with CA 19-9 level as well as endoscopic ultrasound and tissue sampling is recommended. 2. Fluid-filled and distended stomach and proximal duodenum suggesting at least partial obstruction related to the mass involving the second portion of the duodenum. 3. Mild hepatic steatosis. 4. Bilateral minimal nonobstructing nephrolithiasis. 5. Mild cardiomegaly. Aortic Atherosclerosis (ICD10-I70.0). Electronically Signed   By: Helyn Numbers M.D.   On: 07/07/2023 14:09     A/P: 89 year old relatively fit woman with new diagnosis of pancreatic head lesion potentially invading second portion of duodenum and abutting the SMV, deviating the main biliary and pancreatic ductal structures.  Gastric outlet obstruction. I discussed options at the bedside with patient and patient's husband, daughter, and son-in-law.  This is a large tumor, abutting her SMV and she will need chemotherapy first if malignancy is confirmed.  Currently GI team is waiting on results from previous biopsies, if inconclusive, then will need to proceed with EUS  for further biopsy.  If a duodenal stent is feasible, this is what we would recommend.  I have reviewed this case with our hepatobiliary surgeon who agrees with this recommendation.  Stent will not compromise a Whipple if she is potentially a surgical candidate in the future although that seems unlikely given her age despite her good health otherwise.   The GJ bypass would make future Whipple more difficult but is certainly not prohibitive.  If stent ends up not being feasible or is not successful in relieving her gastric outlet obstruction, general surgery  team will be happy to proceed with palliative gastrojejunal anastomosis likely with G-tube placement.  I discussed with them briefly risks of surgery including bleeding, infection, pain, scarring, injury to intra-abdominal structures, anastomotic leak/bleeding/stricture, ongoing poor gastric emptying/nonresolution of nausea and vomiting which is very common in this scenario, as well as general cardiovascular/pulmonary/thromboembolic complications.  Questions welcomed and answered.  We will be available as needed.      Patient Active Problem List   Diagnosis Date Noted   Malnutrition of moderate degree 07/08/2023   Pancreatic mass 07/07/2023   Medication management 10/02/2017   Irregular heart rate 10/02/2017   Sinus bradycardia 10/02/2017   Premature atrial contractions 10/02/2017   Hypertension    Hyperlipidemia    Vitamin D deficiency        Phylliss Blakes, MD [facility]  See AMION to contact appropriate on-call provider   [facility]

## 2023-07-10 ENCOUNTER — Inpatient Hospital Stay (HOSPITAL_COMMUNITY): Payer: Medicare Other | Admitting: Anesthesiology

## 2023-07-10 ENCOUNTER — Inpatient Hospital Stay (HOSPITAL_COMMUNITY): Payer: Medicare Other

## 2023-07-10 ENCOUNTER — Encounter (HOSPITAL_COMMUNITY)
Admission: EM | Disposition: A | Discharge status: Home / Self Care | Payer: Self-pay | Source: Home / Self Care | Attending: Internal Medicine

## 2023-07-10 ENCOUNTER — Encounter (HOSPITAL_COMMUNITY): Payer: Self-pay | Admitting: Gastroenterology

## 2023-07-10 DIAGNOSIS — E785 Hyperlipidemia, unspecified: Secondary | ICD-10-CM

## 2023-07-10 DIAGNOSIS — E611 Iron deficiency: Secondary | ICD-10-CM | POA: Diagnosis not present

## 2023-07-10 DIAGNOSIS — T182XXA Foreign body in stomach, initial encounter: Secondary | ICD-10-CM | POA: Diagnosis not present

## 2023-07-10 DIAGNOSIS — C250 Malignant neoplasm of head of pancreas: Secondary | ICD-10-CM

## 2023-07-10 DIAGNOSIS — E538 Deficiency of other specified B group vitamins: Secondary | ICD-10-CM

## 2023-07-10 DIAGNOSIS — K8689 Other specified diseases of pancreas: Secondary | ICD-10-CM | POA: Diagnosis not present

## 2023-07-10 DIAGNOSIS — Z800 Family history of malignant neoplasm of digestive organs: Secondary | ICD-10-CM

## 2023-07-10 DIAGNOSIS — I4891 Unspecified atrial fibrillation: Secondary | ICD-10-CM

## 2023-07-10 DIAGNOSIS — D649 Anemia, unspecified: Secondary | ICD-10-CM

## 2023-07-10 DIAGNOSIS — Z809 Family history of malignant neoplasm, unspecified: Secondary | ICD-10-CM

## 2023-07-10 HISTORY — PX: ESOPHAGOGASTRODUODENOSCOPY (EGD) WITH PROPOFOL: SHX5813

## 2023-07-10 HISTORY — PX: DUODENAL STENT PLACEMENT: SHX5541

## 2023-07-10 LAB — CBC
HCT: 25.5 % — ABNORMAL LOW (ref 36.0–46.0)
Hemoglobin: 7.9 g/dL — ABNORMAL LOW (ref 12.0–15.0)
MCH: 30.3 pg (ref 26.0–34.0)
MCHC: 31 g/dL (ref 30.0–36.0)
MCV: 97.7 fL (ref 80.0–100.0)
Platelets: 224 10*3/uL (ref 150–400)
RBC: 2.61 MIL/uL — ABNORMAL LOW (ref 3.87–5.11)
RDW: 13.4 % (ref 11.5–15.5)
WBC: 4.7 10*3/uL (ref 4.0–10.5)
nRBC: 0 % (ref 0.0–0.2)

## 2023-07-10 LAB — COMPREHENSIVE METABOLIC PANEL
ALT: 14 U/L (ref 0–44)
AST: 20 U/L (ref 15–41)
Albumin: 3.1 g/dL — ABNORMAL LOW (ref 3.5–5.0)
Alkaline Phosphatase: 48 U/L (ref 38–126)
Anion gap: 6 (ref 5–15)
BUN: 18 mg/dL (ref 8–23)
CO2: 26 mmol/L (ref 22–32)
Calcium: 9.1 mg/dL (ref 8.9–10.3)
Chloride: 107 mmol/L (ref 98–111)
Creatinine, Ser: 0.74 mg/dL (ref 0.44–1.00)
GFR, Estimated: >60 mL/min (ref >60)
Glucose, Bld: 116 mg/dL — ABNORMAL HIGH (ref 70–99)
Potassium: 4.2 mmol/L (ref 3.5–5.1)
Sodium: 139 mmol/L (ref 135–145)
Total Bilirubin: 0.6 mg/dL (ref 0.3–1.2)
Total Protein: 5.3 g/dL — ABNORMAL LOW (ref 6.5–8.1)

## 2023-07-10 LAB — PREALBUMIN: Prealbumin: 16 mg/dL — ABNORMAL LOW (ref 18–38)

## 2023-07-10 LAB — CANCER ANTIGEN 19-9: CA 19-9: 514 U/mL — ABNORMAL HIGH (ref 0–35)

## 2023-07-10 SURGERY — ESOPHAGOGASTRODUODENOSCOPY (EGD) WITH PROPOFOL
Anesthesia: Monitor Anesthesia Care

## 2023-07-10 MED ORDER — SODIUM CHLORIDE 0.9 % IV SOLN
INTRAVENOUS | Status: DC | PRN
  Administered 2023-07-10: 12 mL

## 2023-07-10 MED ORDER — SODIUM CHLORIDE 0.9 % IV SOLN
250.0000 mg | Freq: Once | INTRAVENOUS | Status: AC
  Administered 2023-07-10: 250 mg via INTRAVENOUS
  Filled 2023-07-10: qty 20

## 2023-07-10 MED ORDER — SODIUM CHLORIDE 0.9 % IV SOLN: INTRAVENOUS | Status: DC

## 2023-07-10 MED ORDER — PROPOFOL 500 MG/50ML IV EMUL
INTRAVENOUS | Status: DC | PRN
  Administered 2023-07-10: 125 ug/kg/min via INTRAVENOUS

## 2023-07-10 MED ORDER — PROPOFOL 10 MG/ML IV BOLUS
INTRAVENOUS | Status: DC | PRN
  Administered 2023-07-10: 30 mg via INTRAVENOUS

## 2023-07-10 MED ORDER — PROPOFOL 1000 MG/100ML IV EMUL
INTRAVENOUS | Status: AC
  Filled 2023-07-10: qty 100

## 2023-07-10 SURGICAL SUPPLY — 15 items
BLOCK BITE 60FR ADLT L/F BLUE (MISCELLANEOUS) ×1 IMPLANT
ELECT REM PT RETURN 9FT ADLT (ELECTROSURGICAL) IMPLANT
ELECTRODE REM PT RTRN 9FT ADLT (ELECTROSURGICAL) IMPLANT
FORCEP RJ3 GP 1.8X160 W-NEEDLE (CUTTING FORCEPS) IMPLANT
FORCEPS BIOP RAD 4 LRG CAP 4 (CUTTING FORCEPS) IMPLANT
NDL SCLEROTHERAPY 25GX240 (NEEDLE) IMPLANT
NEEDLE SCLEROTHERAPY 25GX240 (NEEDLE) IMPLANT
PROBE APC STR FIRE (PROBE) IMPLANT
PROBE INJECTION GOLD (MISCELLANEOUS)
PROBE INJECTION GOLD 7FR (MISCELLANEOUS) IMPLANT
SNARE SHORT THROW 13M SML OVAL (MISCELLANEOUS) IMPLANT
SYR 50ML LL SCALE MARK (SYRINGE) IMPLANT
TUBING ENDO SMARTCAP PENTAX (MISCELLANEOUS) ×2 IMPLANT
TUBING IRRIGATION ENDOGATOR (MISCELLANEOUS) ×1 IMPLANT
WATER STERILE IRR 1000ML POUR (IV SOLUTION) IMPLANT

## 2023-07-10 NOTE — Op Note (Signed)
[facility] Patient Name: Wendy [name] Procedure Date: 07/10/2023 MRN: [id number] Attending MD: Vida Rigger , MD, [id number] Date of Birth: 1934-01-23 CSN: [id number] Age: 89 Admit Type: Outpatient Procedure:                Upper GI endoscopy Indications:              For palliative stenting of stenosing neoplasm of                            the duodenum, For therapy of duodenal stenosis Providers:                Vida Rigger, MD, Fransisca Connors, Adin Hector, RN,                            Kandice Robinsons, Technician Referring MD:              Medicines:                Monitored Anesthesia Care Complications:            No immediate complications. Estimated Blood Loss:     Estimated blood loss: none. Procedure:                Pre-Anesthesia Assessment:                           - Prior to the procedure, a History and Physical                            was performed, and patient medications and                            allergies were reviewed. The patient's tolerance of                            previous anesthesia was also reviewed. The risks                            and benefits of the procedure and the sedation                            options and risks were discussed with the patient.                            All questions were answered, and informed consent                            was obtained. Prior Anticoagulants: The patient has                            taken Xarelto (rivaroxaban), last dose was 4 days                            prior to procedure. ASA Grade Assessment: II - A  patient with mild systemic disease. After reviewing                            the risks and benefits, the patient was deemed in                            satisfactory condition to undergo the procedure.                           After obtaining informed consent, the endoscope was                            passed under direct vision. Throughout  the                            procedure, the patient's blood pressure, pulse, and                            oxygen saturations were monitored continuously. The                            [id number] ([id number]) Olympus therapeutic endoscope                            was introduced through the mouth, and advanced to                            the duodenal bulb. The upper GI endoscopy was                            technically difficult and complex due to abnormal                            anatomy. Successful completion of the procedure was                            aided by performing the maneuvers documented                            (below) in this report. The patient tolerated the                            procedure well. Scope In: Scope Out: Findings:      The larynx was normal.      The examined esophagus was normal.      A large amount of food (residue) was found in the gastric body.      A severe extrinsic deformity was found in the first portion of the       duodenum. An ERCP catheter was placed through the opening in the       duodenal contrast was injected which confirmed the duodenum and the wire       was advanced under fluoroscopy and coiled in the distal duodenum. And       the catheter was removed making sure to keep  the wire in the proper       position and then this was stented with a 22 mm x 9 cm WallFlex stent       under fluoroscopic and endoscopic guidance. The introducer and wire were       then removed and the scope was removed after air was suctioned and the       stent was in good position both endoscopically and under fluoroscopy and       the patient tolerated the procedure well      The exam was otherwise without abnormality. Impression:               - Normal larynx.                           - Normal esophagus.                           - A large amount of food (residue) in the stomach.                            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                            Prosthesis placed.                           - The examination was otherwise normal.                           - An ERCP catheter was placed and dye injected as                            above and wire were advanced as above.                           - No specimens collected. Moderate Sedation:      Not Applicable - Patient had care per Anesthesia. Recommendation:           - Clear liquid diet today. If doing well will need                            a dietary consult tomorrow for post stent diet and                            can have full liquids tomorrow if doing well and                            then soft solids on Saturday                           - Continue present medications. Reevaluate blood  thinner needs and possibly talk to cardiology about                            lowest dose possible and make sure oncology                            consulted and okay to resume blood thinners in 2 or                            3 days if doing well less okay with cardiology to                            hold longer                           - Return to GI clinic PRN.                           - Telephone GI clinic if symptomatic PRN. Procedure Code(s):        --- Professional ---                           [id number], Esophagogastroduodenoscopy, flexible,                            transoral; with placement of endoscopic stent                            (includes pre- and post-dilation and guide wire                            passage, when performed)                           74360, Intraluminal dilation of strictures and/or                            obstructions (eg, esophagus), radiological                            supervision and interpretation Diagnosis Code(s):        --- Professional ---                           K31.89, Other diseases of stomach and duodenum                            K31.5, Obstruction of duodenum                           D49.0, Neoplasm of unspecified behavior of                            digestive system CPT copyright 2022 [employer]. All rights reserved. The codes documented in this report are preliminary and upon coder review may  be revised to meet current compliance requirements. Vida Rigger, MD 07/10/2023 11:50:55 AM This report has been signed electronically. Number of Addenda: 0

## 2023-07-10 NOTE — Anesthesia Postprocedure Evaluation (Signed)
Anesthesia Post Note  Patient: Wendy [name]  Procedure(s) Performed: ESOPHAGOGASTRODUODENOSCOPY (EGD) WITH PROPOFOL DUODENAL STENT PLACEMENT     Patient location during evaluation: Endoscopy Anesthesia Type: MAC Level of consciousness: awake and alert Pain management: pain level controlled Vital Signs Assessment: post-procedure vital signs reviewed and stable Respiratory status: spontaneous breathing, nonlabored ventilation, respiratory function stable and patient connected to nasal cannula oxygen Cardiovascular status: blood pressure returned to baseline and stable Postop Assessment: no apparent nausea or vomiting Anesthetic complications: no   No notable events documented.  Last Vitals:  Vitals:   07/10/23 1200 07/10/23 1231  BP: (!) 141/57 (!) 141/58  Pulse: (!) 40 82  Resp: (!) 24 16  Temp:  36.6 C  SpO2: 96% 100%    Last Pain:  Vitals:   07/10/23 1231  TempSrc: Oral  PainSc:                  Trevor Iha

## 2023-07-10 NOTE — Transfer of Care (Signed)
Immediate Anesthesia Transfer of Care Note  Patient: Wendy [name]  Procedure(s) Performed: ESOPHAGOGASTRODUODENOSCOPY (EGD) WITH PROPOFOL DUODENAL STENT PLACEMENT  Patient Location: Endoscopy Unit  Anesthesia Type:MAC  Level of Consciousness: awake and patient cooperative  Airway & Oxygen Therapy: Patient Spontanous Breathing and Patient connected to face mask  Post-op Assessment: Report given to RN and Post -op Vital signs reviewed and stable  Post vital signs: Reviewed and stable  Last Vitals:  Vitals Value Taken Time  BP    Temp    Pulse    Resp    SpO2      Last Pain:  Vitals:   07/10/23 0920  TempSrc: (P) Temporal  PainSc: (P) 0-No pain         Complications: No notable events documented.

## 2023-07-10 NOTE — Consult Note (Addendum)
New Hematology/Oncology Consult   Requesting OZ:HYQMVH Regalado      Reason for Consult: Pancreas cancer  HPI: Ms. Leisner had a urinary tract infection and completed multiple courses of antibiotics in June of this year.  She developed diarrhea after completing the antibiotics.  This was followed by the onset of anterior chest/upper abdominal discomfort and nausea/vomiting over the past 2 weeks.  She presented to the emergency room 07/07/2023. A CBC found the hemoglobin at 7, WBC 5.2, and platelets 283,000.  Ferritin returned at 10 in the vitamin B12 at 150.  A CT of the abdomen pelvis 07/07/2023 revealed a 5.3 cm pancreas head mass with invasion of the second portion of the duodenum and abutment of the SMV without invasion or encasement.  The proximal duodenum and stomach were fluid-filled and distended.  Dr. Ewing Schlein was consulted and she was taken to an upper endoscopy on 07/08/2023.  A large amount of food residue was noted in the stomach precluding performance of an EUS.  An extrinsic deformity was noted at the first portion of the duodenum.  A biopsy was obtained. The pathology from the duodenal biopsy revealed a microscopic focus of poorly differentiated adenocarcinoma.  Dr. Ewing Schlein placed a palliative duodenal stent on 07/10/2023.  She reports feeling better.  She tolerated liquids today.     Past Medical History:  Diagnosis Date   Hyperlipidemia    Hypertension    Vitamin D deficiency    .  G1, P1   .  Atrial fibrillation on Xarelto   Past Surgical History:  Procedure Laterality Date   BIOPSY  07/08/2023   Procedure: BIOPSY;  Surgeon: Vida Rigger, MD;  Location: [facility];  Service: Gastroenterology;;   ESOPHAGOGASTRODUODENOSCOPY (EGD) WITH PROPOFOL N/A 07/08/2023   Procedure: ESOPHAGOGASTRODUODENOSCOPY (EGD) WITH PROPOFOL;  Surgeon: Vida Rigger, MD;  Location: [facility];  Service: Gastroenterology;  Laterality: N/A;  :   Current Facility-Administered Medications:    0.9 %   sodium chloride infusion (Manually program via Guardrails IV Fluids), , Intravenous, Once, Vida Rigger, MD, Stopped at 07/07/23 2010   0.9 %  sodium chloride infusion, , Intravenous, Continuous, Magod, Vernia Buff, MD, Last Rate: 50 mL/hr at 07/08/23 1730, New Bag at 07/08/23 1730   acetaminophen (TYLENOL) tablet 650 mg, 650 mg, Oral, Q6H PRN, 650 mg at 07/09/23 2107 **OR** acetaminophen (TYLENOL) suppository 650 mg, 650 mg, Rectal, Q6H PRN, Vida Rigger, MD   albuterol (PROVENTIL) (2.5 MG/3ML) 0.083% nebulizer solution 2.5 mg, 2.5 mg, Nebulization, Q2H PRN, Vida Rigger, MD   cyanocobalamin (VITAMIN B12) injection 1,000 mcg, 1,000 mcg, Intramuscular, Daily, Magod, Vernia Buff, MD, 1,000 mcg at 07/10/23 1245   docusate sodium (COLACE) capsule 100 mg, 100 mg, Oral, BID, Magod, Vernia Buff, MD, 100 mg at 07/10/23 1244   lactated ringers infusion, , Intravenous, Continuous, Vida Rigger, MD, Stopped at 07/10/23 1158   melatonin tablet 5 mg, 5 mg, Oral, QHS PRN, Vida Rigger, MD, 5 mg at 07/07/23 2227   morphine (PF) 2 MG/ML injection 1 mg, 1 mg, Intravenous, Q3H PRN, Vida Rigger, MD   multivitamin with minerals tablet 1 tablet, 1 tablet, Oral, Daily, Magod, Vernia Buff, MD, 1 tablet at 07/10/23 1245   ondansetron (ZOFRAN) tablet 4 mg, 4 mg, Oral, Q6H PRN, 4 mg at 07/09/23 2113 **OR** ondansetron (ZOFRAN) injection 4 mg, 4 mg, Intravenous, Q6H PRN, Vida Rigger, MD, 4 mg at 07/09/23 1252   pantoprazole (PROTONIX) EC tablet 40 mg, 40 mg, Oral, Daily, Vida Rigger, MD, 40 mg at 07/10/23 1244   polyethylene  glycol (MIRALAX / GLYCOLAX) packet 17 g, 17 g, Oral, Daily PRN, Vida Rigger, MD, 17 g at 07/08/23 2123   prochlorperazine (COMPAZINE) injection 5 mg, 5 mg, Intravenous, Q6H PRN, Vida Rigger, MD, 5 mg at 07/09/23 1548:   sodium chloride   Intravenous Once   cyanocobalamin  1,000 mcg Intramuscular Daily   docusate sodium  100 mg Oral BID   multivitamin with minerals  1 tablet Oral Daily   pantoprazole  40 mg Oral Daily   :   Allergies  Allergen Reactions   Bactrim [Sulfamethoxazole-Trimethoprim] Nausea And Vomiting and Other (See Comments)    "Knocked her for a loop"- dizziness and weakness, also  :  FH: Her father had pancreas cancer.  Her mother had uterine cancer.  SOCIAL HISTORY: She lives with her husband in [place].  She is a Futures trader.  She does not use cigarettes or alcohol.  Review of Systems:  Positives include: 2-week history of nausea, anterior chest/upper abdominal discomfort, vomiting, and anorexia.  1 episode of diarrhea  A complete ROS was otherwise negative.   Physical Exam:  Blood pressure (!) 141/58, pulse 82, temperature 97.8 F (36.6 C), temperature source Oral, resp. rate 16, height 5\' 6"  (1.676 m), weight 119 lb 0.8 oz (54 kg), SpO2 100%.  HEENT: Oral cavity without visible mass, neck without mass Lungs: Clear bilaterally Cardiac: Regular rate and rhythm Abdomen: No mass, no hepatosplenomegaly, no apparent ascites, mild tenderness in the right medial subcostal region  Vascular: No leg edema Lymph nodes: No cervical, supraclavicular, axillary, or inguinal nodes Neurologic: Alert and oriented, the motor exam appears intact in the upper and lower extremities bilaterally Skin: No rash Musculoskeletal: No spine tenderness  LABS:   Recent Labs    07/09/23 0431 07/10/23 0414  WBC 3.5* 4.7  HGB 7.6* 7.9*  HCT 23.8* 25.5*  PLT 219 224     Recent Labs    07/09/23 0431 07/10/23 0414  NA 136 139  K 3.4* 4.2  CL 104 107  CO2 26 26  GLUCOSE 105* 116*  BUN 16 18  CREATININE 0.67 0.74  CALCIUM 8.8* 9.1      RADIOLOGY:  DG C-Arm 1-60 Min-No Report  Result Date: 07/10/2023 Fluoroscopy was utilized by the requesting physician.  No radiographic interpretation.   CT ABDOMEN PELVIS W CONTRAST  Result Date: 07/07/2023 CLINICAL DATA:  Abdominal pain, acute, nonlocalized EXAM: CT ABDOMEN AND PELVIS WITH CONTRAST TECHNIQUE: Multidetector CT imaging of the  abdomen and pelvis was performed using the standard protocol following bolus administration of intravenous contrast. RADIATION DOSE REDUCTION: This exam was performed according to the departmental dose-optimization program which includes automated exposure control, adjustment of the mA and/or kV according to patient size and/or use of iterative reconstruction technique. CONTRAST:  75mL OMNIPAQUE IOHEXOL 300 MG/ML  SOLN COMPARISON:  None Available. FINDINGS: Lower chest: Visualized lung bases are clear. Mild cardiomegaly. No acute abnormality. Hepatobiliary: Mild hepatic steatosis. No enhancing intrahepatic mass. No intra or extrahepatic biliary ductal dilation. Gallbladder unremarkable. Pancreas: There is a heterogeneously enhancing expansile mass within the head of the pancreas measuring at least 3.9 x 5.3 by 3.4 cm in dimension, situated within the pancreatico duodenal groove likely invading the second portion of the duodenum, best seen on image # 30/2, and abutting the terminal superior mesenteric vein without invasion or encasement, best seen on image # 30-32/2 and coronal image # 44/8. The main pancreatic duct is deviated superiorly, as is the distal common duct, but does not appear dilated.  Focal pancreatitis is a consideration though is considered less likely given the lack of significant surrounding inflammatory stranding and apparent invasion of the second portion of the duodenum. Single enlarged periportal lymph node is seen at axial image # 24/2 measuring 11 mm in short axis diameter. Normal enhancement and preserved parenchyma involving of the body and tail the pancreas. No peripancreatic fluid collections. Spleen: Unremarkable Adrenals/Urinary Tract: The adrenal glands are unremarkable. The kidneys are normal in size and position. Punctate 1-2 mm nonobstructing calculi are seen within the kidneys bilaterally. No ureteral calculi. No hydronephrosis. No enhancing intrarenal masses. Simple cortical cyst  within the interpolar region of the left kidney for which no follow-up imaging is recommended. The bladder is unremarkable. Stomach/Bowel: The stomach and proximal duodenum appears fluid-filled and distended suggesting at least partial obstruction related to the mass involving the second portion of the duodenum. The stomach, small bowel, and large bowel are otherwise unremarkable. The appendix is normal. No free intraperitoneal gas or fluid. Vascular/Lymphatic: Moderate aortoiliac atherosclerotic calcification. No aortic aneurysm. Superior mesenteric artery and common hepatic artery are separate from the above-mentioned pancreatic mass. No additional pathologic adenopathy within the abdomen and pelvis. Reproductive: Uterus and bilateral adnexa are unremarkable. Other: No abdominal wall hernia Musculoskeletal: Degenerative changes are seen within the lumbar spine and hips bilaterally. No acute bone abnormality. No lytic or blastic bone lesion. IMPRESSION: 1. 5.3 cm heterogeneously enhancing expansile mass within the head of the pancreas, situated within the pancreatico duodenal groove likely invading the second portion of the duodenum and abutting the terminal superior mesenteric vein without invasion or encasement, and deviation of the main biliary and pancreatic ductal structures. Findings are concerning for pancreatic adenocarcinoma. While focal pancreatitis is a consideration, this is considered less likely given the evidence of mass effect and mural invasion. Correlation with CA 19-9 level as well as endoscopic ultrasound and tissue sampling is recommended. 2. Fluid-filled and distended stomach and proximal duodenum suggesting at least partial obstruction related to the mass involving the second portion of the duodenum. 3. Mild hepatic steatosis. 4. Bilateral minimal nonobstructing nephrolithiasis. 5. Mild cardiomegaly. Aortic Atherosclerosis (ICD10-I70.0). Electronically Signed   By: Helyn Numbers M.D.   On:  07/07/2023 14:09   DG Chest 2 View  Result Date: 07/07/2023 CLINICAL DATA:  Dyspnea with exertion. EXAM: CHEST - 2 VIEW COMPARISON:  None Available. FINDINGS: Mild cardiomegaly. Both lungs are clear. The visualized skeletal structures are unremarkable. IMPRESSION: No active cardiopulmonary disease. Aortic Atherosclerosis (ICD10-I70.0). Electronically Signed   By: Lupita Raider M.D.   On: 07/07/2023 11:37    Assessment and Plan:   Pancreas cancer-pancreas head mass on CT abdomen/pelvis 07/07/2023, biopsy of duodenal stricture 07/08/2023-poorly differentiated adenocarcinoma CT abdomen/pelvis 07/07/2019 24-5.3 cm pancreas head mass with probable invasion of the duodenum seen him and abutment of the SMV without invasion or encasement.  Fluid-filled and distended stomach and proximal duodenum, single 11 mm periportal lymph node EGD 07/08/2023-large food residue in the stomach, extrinsic deformity at the first portion of the duodenum-biopsied 2.   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   Anemia 4.   Iron deficiency 5.   B12 deficiency 6.   Atrial fibrillation-maintained on Xarelto prior to hospital admission 7.  Family history of pancreas and uterine cancer 8.  Hyperlipidemia  Ms. Jansma was admitted with gastric outlet obstruction secondary to a pancreas head mass invading the duodenum.  She had severe anemia on hospital admission, likely related  to GI bleeding from the mass while on anticoagulation therapy.  She underwent an upper endoscopy and biopsy of the duodenal stricture with the pathology confirming a poorly differentiated adenocarcinoma.  She appears to have a localized and potentially resectable tumor based on the staging evaluation to date.  I discussed treatment of pancreas cancer with Ms. Cortner and her family.  She understands the only potentially curative therapy is surgery.  I will present her case at the GI tumor  conference and discussed the case with surgical oncology.  We will consider neoadjuvant and palliative chemotherapy.  She has undergone placement of a palliative duodenal stent.  Hopefully she will be able to tolerate a diet with the stent in place.  Ms. Alfonzo appears to be in excellent health, but understands the potential for significant morbidity with a pancreaticoduodenectomy.  Recommendations: Diet per Dr. Ewing Schlein Decision on resuming Xarelto per cardiology and the medical service, I favor holding Xarelto given the potential for bleeding Replete iron and vitamin B12 Genetics counselor evaluation Staging chest CT Case will be presented at the GI tumor conference within the next 2 weeks Check CA 19-9 Outpatient follow-up will be scheduled at the Cancer center   [name], MD 07/10/2023, 4:31 PM

## 2023-07-10 NOTE — Progress Notes (Addendum)
Wendy [name] 10:48 AM  Subjective: Patient doing well improved abdominal pain only minimal vomiting last night has no new complaints we rediscussed the procedure  Objective: Vital signs stable afebrile no acute distress exam please see preassessment evaluation labs stable  Assessment: Pancreatic cancer with duodenal obstruction  Plan: Okay to proceed with endoscopy and hopefully duodenal stent placement with anesthesia assistance and as an addendum I did discuss the case with Dr. Truett Perna from oncology who will see her later today  [facility]  office [phone] After 5PM or if no answer call [phone]

## 2023-07-10 NOTE — Plan of Care (Signed)

## 2023-07-10 NOTE — Anesthesia Procedure Notes (Signed)
Procedure Name: MAC Date/Time: 07/10/2023 10:50 AM  Performed by: Vanessa Humacao, CRNAPre-anesthesia Checklist: Patient identified, Emergency Drugs available, Suction available and Patient being monitored Patient Re-evaluated:Patient Re-evaluated prior to induction Oxygen Delivery Method: Simple face mask

## 2023-07-10 NOTE — Progress Notes (Signed)
PROGRESS NOTE    Wendy [name]  ZOX:[id number] DOB: 02/28/1934 DOA: 07/07/2023 PCP: [name], MD   Brief Narrative: 89 year old with past medical history significant for A-fib, hypertension, hyperlipidemia presents with nausea, vomiting, constipation, found to have iron deficiency anemia, new pancreatic mass.   Assessment & Plan:   Principal Problem:   Pancreatic mass Active Problems:   Malnutrition of moderate degree  1-Poorly Differentiated Adenocarcinoma  Heterogenous Pancreatic Head Mass:  Duodenal Obstruction.  -Patient presented with nausea and constipation. -CT abdomen and pelvis showed 5.3 cm heterogeneously  enhancing expansile mass within the head of the pancreas situated within the pancreaticoduodenal groove likely invading the second portion of the duodenum  abutting  the terminal superior mesenteric vein without invasion and deviation of the main biliary and pancreatic ductal structures. -Underwent upper GI endoscopy 7/9; show large amount of fluid in the stomach, normal duodenal bulb.  Duodenal deformity.  Biopsy.   -Underwent Duodenal Stent placement 7/11 Continue with clear liquid diet.   Hypokalemia:  Replaced  Hyponatremia:  Continue with IV fluids  B-12 deficiency:  Started on B12 intramuscular injection  Iron deficiency anemia:  IV iron times 2.  Monitor hemoglobin  HTN: Hold hydrochlorothiazide in the setting of poor oral intake Monitor BP.    A fib Continue to hold xarelto.   Constipation; had small BM after dulcolax Supp  Nutrition;  Albumin 3.1, pre albumin 16. Strat ensure when tolerates diet.     Estimated body mass index is 19.21 kg/m as calculated from the following:   Height as of this encounter: 5\' 6"  (1.676 m).   Weight as of this encounter: 54 kg.   DVT prophylaxis: SCD Code Status: Full code Family Communication: family at bedside Disposition Plan:  Status is: Observation The patient will require care spanning > 2  midnights and should be moved to inpatient because: management of pancreatic mass, poor oral intake.     Consultants:  GI  Procedures:  Endoscopy 7/09  Antimicrobials:    Subjective: She is feeling better. She was able to tolerates clear liquid. No vomiting.  Had very small BM after suppository    Objective: Vitals:   07/10/23 1140 07/10/23 1150 07/10/23 1200 07/10/23 1231  BP: 136/64 (!) 133/57 (!) 141/57 (!) 141/58  Pulse: 86 79 (!) 40 82  Resp: 20 19 (!) 24 16  Temp:    97.8 F (36.6 C)  TempSrc:    Oral  SpO2: 100% 100% 96% 100%  Weight:      Height:        Intake/Output Summary (Last 24 hours) at 07/10/2023 1433 Last data filed at 07/10/2023 1121 Gross per 24 hour  Intake 800 ml  Output --  Net 800 ml   Filed Weights   07/07/23 1021 07/08/23 1140 07/10/23 0920  Weight: 54 kg 54 kg 54 kg    Examination:  General exam: NAD Respiratory system: CTA Cardiovascular system: S 1, S 2 RRR Gastrointestinal system: BS present, soft nt Central nervous system: alert Extremities: No edema    Data Reviewed: I have personally reviewed following labs and imaging studies  CBC: Recent Labs  Lab 07/07/23 1038 07/08/23 0359 07/09/23 0431 07/10/23 0414  WBC 5.2 4.3 3.5* 4.7  NEUTROABS 3.5  --   --   --   HGB 7.0* 7.6* 7.6* 7.9*  HCT 22.2* 23.8* 23.8* 25.5*  MCV 98.2 97.1 95.2 97.7  PLT 283 213 219 224   Basic Metabolic Panel: Recent Labs  Lab  07/07/23 1038 07/08/23 0359 07/09/23 0431 07/10/23 0414  NA 135 133* 136 139  K 2.8* 3.4* 3.4* 4.2  CL 97* 101 104 107  CO2 29 23 26 26   GLUCOSE 141* 102* 105* 116*  BUN 33* 26* 16 18  CREATININE 0.91 0.79 0.67 0.74  CALCIUM 10.7* 9.1 8.8* 9.1   GFR: Estimated Creatinine Clearance: 43 mL/min (by C-G formula based on SCr of 0.74 mg/dL). Liver Function Tests: Recent Labs  Lab 07/07/23 1038 07/10/23 0414  AST 26 20  ALT 18 14  ALKPHOS 52 48  BILITOT 0.5 0.6  PROT 6.6 5.3*  ALBUMIN 3.8 3.1*   Recent  Labs  Lab 07/07/23 1038  LIPASE 87*   No results for input(s): "AMMONIA" in the last 168 hours. Coagulation Profile: No results for input(s): "INR", "PROTIME" in the last 168 hours. Cardiac Enzymes: No results for input(s): "CKTOTAL", "CKMB", "CKMBINDEX", "TROPONINI" in the last 168 hours. BNP (last 3 results) No results for input(s): "PROBNP" in the last 8760 hours. HbA1C: No results for input(s): "HGBA1C" in the last 72 hours. CBG: No results for input(s): "GLUCAP" in the last 168 hours. Lipid Profile: No results for input(s): "CHOL", "HDL", "LDLCALC", "TRIG", "CHOLHDL", "LDLDIRECT" in the last 72 hours. Thyroid Function Tests: No results for input(s): "TSH", "T4TOTAL", "FREET4", "T3FREE", "THYROIDAB" in the last 72 hours. Anemia Panel: No results for input(s): "VITAMINB12", "FOLATE", "FERRITIN", "TIBC", "IRON", "RETICCTPCT" in the last 72 hours. Sepsis Labs: No results for input(s): "PROCALCITON", "LATICACIDVEN" in the last 168 hours.  Recent Results (from the past 240 hour(s))  SARS Coronavirus 2 by RT PCR (hospital order, performed in [facility] hospital lab) *cepheid single result test* Anterior Nasal Swab     Status: None   Collection Time: 07/07/23 11:00 AM   Specimen: Anterior Nasal Swab  Result Value Ref Range Status   SARS Coronavirus 2 by RT PCR NEGATIVE NEGATIVE Final    Comment: (NOTE) SARS-CoV-2 target nucleic acids are NOT DETECTED.  The SARS-CoV-2 RNA is generally detectable in upper and lower respiratory specimens during the acute phase of infection. The lowest concentration of SARS-CoV-2 viral copies this assay can detect is 250 copies / mL. A negative result does not preclude SARS-CoV-2 infection and should not be used as the sole basis for treatment or other patient management decisions.  A negative result may occur with improper specimen collection / handling, submission of specimen other than nasopharyngeal swab, presence of viral mutation(s) within  the areas targeted by this assay, and inadequate number of viral copies (<250 copies / mL). A negative result must be combined with clinical observations, patient history, and epidemiological information.  Fact Sheet for Patients:   RoadLapTop.co.za  Fact Sheet for Healthcare Providers: http://kim-miller.com/  This test is not yet approved or  cleared by the [place] FDA and has been authorized for detection and/or diagnosis of SARS-CoV-2 by FDA under an Emergency Use Authorization (EUA).  This EUA will remain in effect (meaning this test can be used) for the duration of the COVID-19 declaration under Section 564(b)(1) of the Act, 21 U.S.C. section 360bbb-3(b)(1), unless the authorization is terminated or revoked sooner.  Performed at [facility], [address] [place], [place] [address]   Urine Culture     Status: Abnormal   Collection Time: 07/07/23  2:25 PM   Specimen: Urine, Clean Catch  Result Value Ref Range Status   Specimen Description   Final    URINE, CLEAN CATCH Performed at [facility], [address] [place], [place] [address]    Special Requests   Final    NONE Performed at [facility], [address] [place], [place] [address]    Culture 30,000 COLONIES/mL ESCHERICHIA COLI (A)  Final   Report Status 07/09/2023 FINAL  Final   Organism ID, Bacteria ESCHERICHIA COLI (A)  Final      Susceptibility   Escherichia coli - MIC*    AMPICILLIN <=2 SENSITIVE Sensitive     CEFAZOLIN <=4 SENSITIVE Sensitive     CEFEPIME <=0.12 SENSITIVE Sensitive     CEFTRIAXONE <=0.25 SENSITIVE Sensitive     CIPROFLOXACIN <=0.25 SENSITIVE Sensitive     GENTAMICIN <=1 SENSITIVE Sensitive     IMIPENEM <=0.25 SENSITIVE Sensitive     NITROFURANTOIN <=16 SENSITIVE Sensitive     TRIMETH/SULFA >=320 RESISTANT Resistant     AMPICILLIN/SULBACTAM <=2 SENSITIVE Sensitive      PIP/TAZO <=4 SENSITIVE Sensitive     * 30,000 COLONIES/mL ESCHERICHIA COLI         Radiology Studies: DG C-Arm 1-60 Min-No Report  Result Date: 07/10/2023 Fluoroscopy was utilized by the requesting physician.  No radiographic interpretation.        Scheduled Meds:  sodium chloride   Intravenous Once   cyanocobalamin  1,000 mcg Intramuscular Daily   docusate sodium  100 mg Oral BID   multivitamin with minerals  1 tablet Oral Daily   pantoprazole  40 mg Oral Daily   Continuous Infusions:  sodium chloride 50 mL/hr at 07/08/23 1730   ferric gluconate (FERRLECIT) IVPB 250 mg (07/10/23 1253)   lactated ringers Stopped (07/10/23 1158)     LOS: 2 days    Time spent: 35 minutes    Wakeelah Solan A Aleksa Collinsworth, MD [facility]   If 7PM-7AM, please contact night-coverage [email or web address]  07/10/2023, 2:33 PM

## 2023-07-11 ENCOUNTER — Encounter: Payer: Self-pay | Admitting: *Deleted

## 2023-07-11 ENCOUNTER — Inpatient Hospital Stay (HOSPITAL_COMMUNITY): Payer: Medicare Other

## 2023-07-11 ENCOUNTER — Encounter (HOSPITAL_COMMUNITY): Payer: Self-pay | Admitting: Gastroenterology

## 2023-07-11 DIAGNOSIS — K8689 Other specified diseases of pancreas: Secondary | ICD-10-CM | POA: Diagnosis not present

## 2023-07-11 LAB — CBC
HCT: 24.7 % — ABNORMAL LOW (ref 36.0–46.0)
Hemoglobin: 7.7 g/dL — ABNORMAL LOW (ref 12.0–15.0)
MCH: 30.6 pg (ref 26.0–34.0)
MCHC: 31.2 g/dL (ref 30.0–36.0)
MCV: 98 fL (ref 80.0–100.0)
Platelets: 219 10*3/uL (ref 150–400)
RBC: 2.52 MIL/uL — ABNORMAL LOW (ref 3.87–5.11)
RDW: 13.4 % (ref 11.5–15.5)
WBC: 5.2 10*3/uL (ref 4.0–10.5)
nRBC: 0 % (ref 0.0–0.2)

## 2023-07-11 LAB — CANCER ANTIGEN 19-9: CA 19-9: 502 U/mL — ABNORMAL HIGH (ref 0–35)

## 2023-07-11 MED ORDER — IOHEXOL 300 MG/ML  SOLN
75.0000 mL | Freq: Once | INTRAMUSCULAR | Status: AC | PRN
  Administered 2023-07-11: 75 mL via INTRAVENOUS

## 2023-07-11 MED ORDER — BISACODYL 10 MG RE SUPP
10.0000 mg | Freq: Once | RECTAL | Status: AC
  Administered 2023-07-11: 10 mg via RECTAL
  Filled 2023-07-11: qty 1

## 2023-07-11 MED ORDER — SODIUM CHLORIDE (PF) 0.9 % IJ SOLN
INTRAMUSCULAR | Status: AC
  Filled 2023-07-11: qty 50

## 2023-07-11 NOTE — Progress Notes (Signed)
Oncology Discharge Planning Note  [facility] Address: [address], [place], [place] [address] Hours of Operation:  Lewayne Bunting, Monday - Friday  Clinic Contact Information:  [phone]) [phone]  Oncology Care Team: Medical Oncologist:  Truett Perna  Patient Details: Name:  Wendy [name], Wendy [name] MRN:   [id number] DOB:   1934-07-05 Reason for Current Admission: @PPROB @  Discharge Planning Narrative: Notification of admission received by Inpatient team for [facility].  Discharge follow-up appointments for oncology are current and available on the AVS and MyChart.   Upon discharge from the hospital, hematology/oncology's post discharge plan of care for the outpatient setting is:  July 24, 2023   12:00pm with Dr Truett Perna  [facility] [address] [place], [place] [address]  [phone]    Jaici Conrod will be called within two business days after discharge to review hematology/oncology's plan of care for full understanding.    Outpatient Oncology Specific Care Only: Oncology appointment transportation needs addressed?:  no Oncology medication management for symptom management addressed?:  no Chemo Alert Card reviewed?:  not applicable Immunotherapy Alert Card reviewed?:  not applicable

## 2023-07-11 NOTE — Progress Notes (Signed)
Wendy [name] 11:43 AM  Subjective: Patient doing well not any obvious post stent complications tolerating clear liquids no new complaints and we answered all of her and her family's questions  Objective: Vital signs stable afebrile no acute distress in good spirits abdomen is soft nontender CBC okay  Assessment: Pancreatic cancer with duodenal compression  Plan: Slowly advance diet which we discussed dietary or nutrition team to discuss post stent diet we also discussed possible biliary obstruction at some point in the future which with the stent in place might make ERCP very difficult and I am happy to see back as needed and please call me this weekend if I can be of any help with this hospital stay and I wished her and the family well  [facility]  office [phone] After 5PM or if no answer call [phone]

## 2023-07-11 NOTE — Plan of Care (Signed)

## 2023-07-11 NOTE — Progress Notes (Signed)
    1 Day Post-Op  Subjective: CC: S/p duodenal stent by GI yesterday. Now tolerating liquids without abdominal pain, n/v.   Objective: Vital signs in last 24 hours: Temp:  [97.8 F (36.6 C)-99.3 F (37.4 C)] 98.6 F (37 C) (07/12 0557) Pulse Rate:  [40-95] 73 (07/12 0557) Resp:  [14-24] 18 (07/12 0557) BP: (124-142)/(50-71) 124/57 (07/12 0557) SpO2:  [96 %-100 %] 100 % (07/12 0557) Weight:  [54 kg] 54 kg (07/11 0920) Last BM Date : 07/09/23  Intake/Output from previous day: 07/11 0701 - 07/12 0700 In: 1960.5 [I.V.:1690.5; IV Piggyback:270] Out: -  Intake/Output this shift: No intake/output data recorded.  PE: Gen:  Alert, NAD, pleasant Abd: Soft, ND, NT, +BS,   Lab Results:  Recent Labs    07/09/23 0431 07/10/23 0414  WBC 3.5* 4.7  HGB 7.6* 7.9*  HCT 23.8* 25.5*  PLT 219 224   BMET Recent Labs    07/09/23 0431 07/10/23 0414  NA 136 139  K 3.4* 4.2  CL 104 107  CO2 26 26  GLUCOSE 105* 116*  BUN 16 18  CREATININE 0.67 0.74  CALCIUM 8.8* 9.1   PT/INR No results for input(s): "LABPROT", "INR" in the last 72 hours. CMP     Component Value Date/Time   NA 139 07/10/2023 0414   K 4.2 07/10/2023 0414   CL 107 07/10/2023 0414   CO2 26 07/10/2023 0414   GLUCOSE 116 (H) 07/10/2023 0414   BUN 18 07/10/2023 0414   CREATININE 0.74 07/10/2023 0414   CALCIUM 9.1 07/10/2023 0414   PROT 5.3 (L) 07/10/2023 0414   ALBUMIN 3.1 (L) 07/10/2023 0414   AST 20 07/10/2023 0414   ALT 14 07/10/2023 0414   ALKPHOS 48 07/10/2023 0414   BILITOT 0.6 07/10/2023 0414   GFRNONAA >60 07/10/2023 0414   Lipase     Component Value Date/Time   LIPASE 87 (H) 07/07/2023 1038    Studies/Results: DG C-Arm 1-60 Min-No Report  Result Date: 07/10/2023 Fluoroscopy was utilized by the requesting physician.  No radiographic interpretation.    Anti-infectives: Anti-infectives (From admission, onward)    None        Assessment/Plan 89 year old relatively fit woman with new  diagnosis of pancreatic head lesion potentially invading second portion of duodenum and abutting the SMV, deviating the main biliary and pancreatic ductal structures. Path with poorly differentiated adenocarcinoma. She had a Gastric outlet obstruction 2/2 to this.  - S/p duodenal stent by Dr. Ewing Schlein 7/11. Tolerating liquids. Advance diet per their team - Dr. Truett Perna is following with plans to discuss at GI tumor conference. She tells me she already has f/u with Dr. Truett Perna arranged. Will reach out to Dr. [name] to see timing of follow up with [place].   I reviewed nursing notes, Consultant (GI, Oncology) notes, last 24 h vitals and pain scores, last 48 h intake and output, last 24 h labs and trends, and last 24 h imaging results.   LOS: 3 days    Jacinto Halim , [facility] 07/11/2023, 8:14 AM Please see Amion for pager number during day hours 7:00am-4:30pm

## 2023-07-11 NOTE — Plan of Care (Signed)
No reports of pain. Clear liquids tolerated. Denies pain. VS stable. IV fluids infusing  without issues on IV catheter site. No BM reported this shift.  Problem: Education: Goal: Knowledge of General Education information will improve Description: Including pain rating scale, medication(s)/side effects and non-pharmacologic comfort measures Outcome: Progressing   Problem: Health Behavior/Discharge Planning: Goal: Ability to manage health-related needs will improve Outcome: Progressing   Problem: Activity: Goal: Risk for activity intolerance will decrease Outcome: Progressing   Problem: Nutrition: Goal: Adequate nutrition will be maintained Outcome: Progressing   Problem: Coping: Goal: Level of anxiety will decrease Outcome: Progressing   Problem: Elimination: Goal: Will not experience complications related to bowel motility Outcome: Progressing   Problem: Pain Managment: Goal: General experience of comfort will improve Outcome: Progressing   Problem: Safety: Goal: Ability to remain free from injury will improve Outcome: Progressing

## 2023-07-11 NOTE — Progress Notes (Signed)
PROGRESS NOTE    Wendy [name]  ZOX:[id number] DOB: 1934/05/28 DOA: 07/07/2023 PCP: [name], MD   Brief Narrative: 89 year old with past medical history significant for A-fib, hypertension, hyperlipidemia presents with nausea, vomiting, constipation, found to have iron deficiency anemia, new pancreatic mass.   Assessment & Plan:   Principal Problem:   Pancreatic mass Active Problems:   Malnutrition of moderate degree  1-Poorly Differentiated Adenocarcinoma  Heterogenous Pancreatic Head Mass:  Duodenal Obstruction.  -Patient presented with nausea and constipation. -CT abdomen and pelvis showed 5.3 cm heterogeneously  enhancing expansile mass within the head of the pancreas situated within the pancreaticoduodenal groove likely invading the second portion of the duodenum  abutting  the terminal superior mesenteric vein without invasion and deviation of the main biliary and pancreatic ductal structures. -Underwent upper GI endoscopy 7/9; show large amount of fluid in the stomach, normal duodenal bulb.  Duodenal deformity.  Biopsy.   -Underwent Duodenal Stent placement 7/11 Plan to advance diet to full liquid, hopefully to soft tomorrow.   Hypokalemia:  Replaced  Hyponatremia:  Continue with IV fluids  B-12 deficiency:  Started on B12 intramuscular injection  Iron deficiency anemia:  IV iron times 2.  Monitor hemoglobin  HTN: Hold hydrochlorothiazide in the setting of poor oral intake Monitor BP.    A fib Continue to hold xarelto. Discussed with Dr [name], ok to hold Xarelto. If her risk for bleeding risk after she had Surgery, them will reconsider xarelto.   Constipation; Repeat Dulcolax Supp today   Nutrition;  Albumin 3.1, pre albumin 16. Strat ensure when tolerates diet.     Estimated body mass index is 19.21 kg/m as calculated from the following:   Height as of this encounter: 5\' 6"  (1.676 m).   Weight as of this encounter: 54 kg.   DVT prophylaxis:  SCD Code Status: Full code Family Communication: family at bedside Disposition Plan:  Status is: Observation The patient will require care spanning > 2 midnights and should be moved to inpatient because: management of pancreatic mass, poor oral intake. Home Saturday or Sunday    Consultants:  GI  Procedures:  Endoscopy 7/09  Antimicrobials:    Subjective: She is feeling well, she ambulated. Denies worsening pain. No significant BM   Objective: Vitals:   07/10/23 1200 07/10/23 1231 07/10/23 2138 07/11/23 0557  BP: (!) 141/57 (!) 141/58 (!) 140/71 (!) 124/57  Pulse: (!) 40 82 95 73  Resp: (!) 24 16 20 18   Temp:  97.8 F (36.6 C) 99.3 F (37.4 C) 98.6 F (37 C)  TempSrc:  Oral Oral Oral  SpO2: 96% 100% 99% 100%  Weight:      Height:        Intake/Output Summary (Last 24 hours) at 07/11/2023 1355 Last data filed at 07/11/2023 0400 Gross per 24 hour  Intake 1160.51 ml  Output --  Net 1160.51 ml   Filed Weights   07/07/23 1021 07/08/23 1140 07/10/23 0920  Weight: 54 kg 54 kg 54 kg    Examination:  General exam: NAD Respiratory system: CTA Cardiovascular system: S 1, S 2 RRR Gastrointestinal system: BS present, soft, nt Central nervous system: Alert, conversant Extremities: No edema    Data Reviewed: I have personally reviewed following labs and imaging studies  CBC: Recent Labs  Lab 07/07/23 1038 07/08/23 0359 07/09/23 0431 07/10/23 0414 07/11/23 0748  WBC 5.2 4.3 3.5* 4.7 5.2  NEUTROABS 3.5  --   --   --   --  HGB 7.0* 7.6* 7.6* 7.9* 7.7*  HCT 22.2* 23.8* 23.8* 25.5* 24.7*  MCV 98.2 97.1 95.2 97.7 98.0  PLT 283 213 219 224 219   Basic Metabolic Panel: Recent Labs  Lab 07/07/23 1038 07/08/23 0359 07/09/23 0431 07/10/23 0414  NA 135 133* 136 139  K 2.8* 3.4* 3.4* 4.2  CL 97* 101 104 107  CO2 29 23 26 26   GLUCOSE 141* 102* 105* 116*  BUN 33* 26* 16 18  CREATININE 0.91 0.79 0.67 0.74  CALCIUM 10.7* 9.1 8.8* 9.1   GFR: Estimated  Creatinine Clearance: 43 mL/min (by C-G formula based on SCr of 0.74 mg/dL). Liver Function Tests: Recent Labs  Lab 07/07/23 1038 07/10/23 0414  AST 26 20  ALT 18 14  ALKPHOS 52 48  BILITOT 0.5 0.6  PROT 6.6 5.3*  ALBUMIN 3.8 3.1*   Recent Labs  Lab 07/07/23 1038  LIPASE 87*   No results for input(s): "AMMONIA" in the last 168 hours. Coagulation Profile: No results for input(s): "INR", "PROTIME" in the last 168 hours. Cardiac Enzymes: No results for input(s): "CKTOTAL", "CKMB", "CKMBINDEX", "TROPONINI" in the last 168 hours. BNP (last 3 results) No results for input(s): "PROBNP" in the last 8760 hours. HbA1C: No results for input(s): "HGBA1C" in the last 72 hours. CBG: No results for input(s): "GLUCAP" in the last 168 hours. Lipid Profile: No results for input(s): "CHOL", "HDL", "LDLCALC", "TRIG", "CHOLHDL", "LDLDIRECT" in the last 72 hours. Thyroid Function Tests: No results for input(s): "TSH", "T4TOTAL", "FREET4", "T3FREE", "THYROIDAB" in the last 72 hours. Anemia Panel: No results for input(s): "VITAMINB12", "FOLATE", "FERRITIN", "TIBC", "IRON", "RETICCTPCT" in the last 72 hours. Sepsis Labs: No results for input(s): "PROCALCITON", "LATICACIDVEN" in the last 168 hours.  Recent Results (from the past 240 hour(s))  SARS Coronavirus 2 by RT PCR (hospital order, performed in [facility] hospital lab) *cepheid single result test* Anterior Nasal Swab     Status: None   Collection Time: 07/07/23 11:00 AM   Specimen: Anterior Nasal Swab  Result Value Ref Range Status   SARS Coronavirus 2 by RT PCR NEGATIVE NEGATIVE Final    Comment: (NOTE) SARS-CoV-2 target nucleic acids are NOT DETECTED.  The SARS-CoV-2 RNA is generally detectable in upper and lower respiratory specimens during the acute phase of infection. The lowest concentration of SARS-CoV-2 viral copies this assay can detect is 250 copies / mL. A negative result does not preclude SARS-CoV-2 infection and should  not be used as the sole basis for treatment or other patient management decisions.  A negative result may occur with improper specimen collection / handling, submission of specimen other than nasopharyngeal swab, presence of viral mutation(s) within the areas targeted by this assay, and inadequate number of viral copies (<250 copies / mL). A negative result must be combined with clinical observations, patient history, and epidemiological information.  Fact Sheet for Patients:   RoadLapTop.co.za  Fact Sheet for Healthcare Providers: http://kim-miller.com/  This test is not yet approved or  cleared by the [place] FDA and has been authorized for detection and/or diagnosis of SARS-CoV-2 by FDA under an Emergency Use Authorization (EUA).  This EUA will remain in effect (meaning this test can be used) for the duration of the COVID-19 declaration under Section 564(b)(1) of the Act, 21 U.S.C. section 360bbb-3(b)(1), unless the authorization is terminated or revoked sooner.  Performed at [facility], [address] [place], [place] [address]   Urine Culture     Status: Abnormal  Collection Time: 07/07/23  2:25 PM   Specimen: Urine, Clean Catch  Result Value Ref Range Status   Specimen Description   Final    URINE, CLEAN CATCH Performed at [facility], [address] [place], [place] [address]    Special Requests   Final    NONE Performed at [facility], [address] [place], [place] [address]    Culture 30,000 COLONIES/mL ESCHERICHIA COLI (A)  Final   Report Status 07/09/2023 FINAL  Final   Organism ID, Bacteria ESCHERICHIA COLI (A)  Final      Susceptibility   Escherichia coli - MIC*    AMPICILLIN <=2 SENSITIVE Sensitive     CEFAZOLIN <=4 SENSITIVE Sensitive     CEFEPIME <=0.12 SENSITIVE Sensitive     CEFTRIAXONE <=0.25 SENSITIVE Sensitive     CIPROFLOXACIN <=0.25  SENSITIVE Sensitive     GENTAMICIN <=1 SENSITIVE Sensitive     IMIPENEM <=0.25 SENSITIVE Sensitive     NITROFURANTOIN <=16 SENSITIVE Sensitive     TRIMETH/SULFA >=320 RESISTANT Resistant     AMPICILLIN/SULBACTAM <=2 SENSITIVE Sensitive     PIP/TAZO <=4 SENSITIVE Sensitive     * 30,000 COLONIES/mL ESCHERICHIA COLI         Radiology Studies: DG C-Arm 1-60 Min-No Report  Result Date: 07/10/2023 Fluoroscopy was utilized by the requesting physician.  No radiographic interpretation.        Scheduled Meds:  sodium chloride   Intravenous Once   bisacodyl  10 mg Rectal Once   cyanocobalamin  1,000 mcg Intramuscular Daily   docusate sodium  100 mg Oral BID   multivitamin with minerals  1 tablet Oral Daily   pantoprazole  40 mg Oral Daily   Continuous Infusions:  sodium chloride Stopped (07/10/23 0852)   lactated ringers Stopped (07/10/23 1158)     LOS: 3 days    Time spent: 35 minutes    Skylor Schnapp A Edilia Ghuman, MD [facility]   If 7PM-7AM, please contact night-coverage [email or web address]  07/11/2023, 1:55 PM

## 2023-07-11 NOTE — Progress Notes (Signed)
Mobility Specialist - Progress Note   07/11/23 1051  Mobility  Activity Ambulated independently in hallway  Level of Assistance Independent  Assistive Device None  Distance Ambulated (ft) 700 ft  Activity Response Tolerated well  Mobility Referral Yes  $Mobility charge 1 Mobility  Mobility Specialist Start Time (ACUTE ONLY) 1034  Mobility Specialist Stop Time (ACUTE ONLY) 1051  Mobility Specialist Time Calculation (min) (ACUTE ONLY) 17 min   Pt received in bed and agreeable to mobility. C/o RLQ pain during session. No other complaints during session. Pt to bed after session with all needs met.    [facility]

## 2023-07-12 DIAGNOSIS — K8689 Other specified diseases of pancreas: Secondary | ICD-10-CM | POA: Diagnosis not present

## 2023-07-12 MED ORDER — ONDANSETRON HCL 4 MG PO TABS: 4.0000 mg | ORAL_TABLET | Freq: Four times a day (QID) | ORAL | 0 refills | Status: DC | PRN

## 2023-07-12 MED ORDER — PANTOPRAZOLE SODIUM 40 MG PO TBEC: 40.0000 mg | DELAYED_RELEASE_TABLET | Freq: Every day | ORAL | 0 refills | Status: DC

## 2023-07-12 MED ORDER — POLYETHYLENE GLYCOL 3350 17 G PO PACK: 17.0000 g | PACK | Freq: Every day | ORAL | 0 refills | Status: DC | PRN

## 2023-07-12 MED ORDER — FERROUS SULFATE 325 (65 FE) MG PO TBEC: 325.0000 mg | DELAYED_RELEASE_TABLET | Freq: Every day | ORAL | 1 refills | Status: DC

## 2023-07-12 MED ORDER — ADULT MULTIVITAMIN W/MINERALS CH: 1.0000 | ORAL_TABLET | Freq: Every day | ORAL | 0 refills | Status: DC

## 2023-07-12 MED ORDER — CYANOCOBALAMIN 500 MCG PO TABS: 1000.0000 ug | ORAL_TABLET | Freq: Every day | ORAL | 0 refills | Status: DC

## 2023-07-12 MED ORDER — DOCUSATE SODIUM 100 MG PO CAPS: 100.0000 mg | ORAL_CAPSULE | Freq: Two times a day (BID) | ORAL | 0 refills | Status: DC

## 2023-07-12 NOTE — Progress Notes (Signed)
Mobility Specialist - Progress Note   07/12/23 1028  Mobility  Activity Ambulated with assistance in hallway  Level of Assistance Standby assist, set-up cues, supervision of patient - no hands on  Assistive Device None  Distance Ambulated (ft) 1050 ft  Range of Motion/Exercises Active  Activity Response Tolerated well  Mobility Referral Yes  $Mobility charge 1 Mobility  Mobility Specialist Start Time (ACUTE ONLY) 1015  Mobility Specialist Stop Time (ACUTE ONLY) 1030  Mobility Specialist Time Calculation (min) (ACUTE ONLY) 15 min   Pt received in bed and agreed to mobility. Had no issues throughout session, returned to bed with all needs met.  Marilynne Halsted Mobility Specialist

## 2023-07-12 NOTE — Discharge Instructions (Addendum)
Diet Post Duodenal Stent Placement  Focus on including soft, protein-rich foods in your diet   Tips Chew your food well and eat slowly to help you absorb the nutrients from your food. Chop, grind, or puree your food if you cannot chew your food well. Drink fluids according to the instructions from your health care provider or registered dietitian nutritionist (RDN). Remember to not drink while eating. Wait at least 30 minutes after a meal to drink fluids. When you do drink, sip. Don't gulp or use straws. Let your stomach tell you when you have eaten enough. Fullness will now feel different than it did before surgery. Eat very, very slowly and pay attention to how you feel so you will know when to stop eating. Eat a little more when you are able to, but always stop eating when you are no longer hungry. Don't force yourself to eat more than you can tolerate. If you keep eating, you may end up vomiting or feel discomfort in your chest or abdomen. Make sure you are eating the amount of protein recommended by your health care provider or RDN. Include protein foods at every meal and always eat them first. Do not drink while you are eating. Stay hydrated by sipping fluids between meals. It is OK to include snacks in your diet. Your RDN can provide you with suggestions for healthy snacks.  Foods Recommended and Not Recommended Food Group Foods Recommended Foods Not Recommended  Grains Toast Mashed or baked potatoes or sweet potatoes Well-moistened cooked or dry cereals All pasta and noodles Rice, wild rice* Coarse or dry cereals such as shredded wheat Dry bread dressing Cakes or cookies that are chewy, very dry, high in sugar, and/or high in fat  Protein Foods Thin-sliced, tender meat, poultry, or fish moistened with gravy or sauce Moist ground meat Eggs Casseroles with small chunks of meat Creamy peanut butter, natural and low-sugar Tofu Dry meats and poultry Dry fish or fish with bones Any  foods with nuts or seeds  Vegetables All cooked, tender vegetables Shredded iceberg lettuce All raw vegetables except shredded iceberg lettuce Cooked corn Tough, fried potatoes, potato skins Fibrous, tough, or stringy cooked vegetables  Fruit All fruits canned in water or their own juice Cooked fruits Soft, peeled fresh fruits such as peaches, nectarines, kiwi, mangoes, cantaloupe, honeydew, watermelon (without seeds)* Soft berries with small seeds such as strawberries* Fresh fruits that are difficult to chew such as apples or pears Stringy, high-pulp fruits such as papaya, pineapple, coconut, or mango. Fresh fruits with tough, edible peels or skins such as grapes Uncooked dried fruits such as prunes and apricots Fruit leather, fruit roll-ups, fruit snacks, dried fruits  Dairy and Dairy Alternatives Skim or low-fat (1%) milk Fortified non-dairy milks: pea, soy, or high-protein almond Sugar-free pudding Low-fat frozen yogurt, smooth yogurts (without nuts or whole pieces of fruit) Low-fat cottage cheese High-fat dairy products Milk or yogurt sweetened with sugar Yogurt with nuts or whole pieces of fruit  Fats and Oils Limited amounts of high-fat condiments such as mayonnaise All fats with additives that are coarse, difficult to chew, or chunky, such as cream cheese spread containing nuts or pineapple    Bariatric Surgery Soft Diet Stage Sample 1-Day Menu  Breakfast 1 egg  cup oatmeal made with: 1 cup skim milk  Lunch 2 ounces tuna mixed with: 1 tablespoon fat free mayonnaise  cup cooked carrots  cup canned fruit salad  Evening Meal 3 ounces chicken, moist  cup cooked green  beans 1/4 cup pudding, sugar-free  Evening Snack 5 ounces [place] yogurt, fat-free Between Meals-Sip 60 ounces of noncarbonated, sugar-free flavor enhanced water  Soft Diet Stage Vegan Sample 1-Day Menu  Breakfast  cup oatmeal 1 tablespoon peanut butter, smooth, natural, and low-sugar  Morning Snack 1 cup  soymilk fortified with calcium, vitamin B12, and vitamin D mixed with: 1 scoop protein powder, soy based  Lunch  cup cooked pasta, protein enriched  cup tofu, sauted  cup carrots, cooked  Evening Meal  cup tofu, sauted  cup cooked green beans, cooked  cup canned fruit salad  Evening Snack 5 ounces soy yogurt Between Meals-Sip 60 ounces of noncarbonated, sugar-free flavor enhanced water      Soft Diet Stage Vegetarian (Lacto-Ovo) Sample 1-Day Menu Breakfast  cup oatmeal made with: 1 cup skim milk 1 tablespoon peanut butter, smooth, natural, low-sugar  Morning Snack 1 cup skim milk mixed with: 1 scoop protein powder, soy based  Lunch 1 hard-boiled egg  cup low-fat cottage cheese  cup canned fruit salad  Evening Meal 1/3 cup tofu, sauted  cup cooked green beans  Evening Snack 5 ounces [place] yogurt, fat-free Between Meals-Sip 60 ounces of noncarbonated, sugar-free flavor enhanced water    Please Follow up with PCP for labs next week, to monitor your blood count.

## 2023-07-12 NOTE — Progress Notes (Signed)
Brief Nutrition Note  Received page on on-call pager from RN. She reports pt is discharging home and is requesting discharge diet instructions be added to AVS/ discharge instructions per MD. RN aware RD is not on site today and amenable with instructions added to discharge summary. RD provided diet instructions in AVS.   Wendy [name], RD, LDN, CDCES Registered Dietitian II Certified Diabetes Care and Education Specialist Please refer to [facility] for RD and/or RD on-call/weekend/after hours pager

## 2023-07-12 NOTE — Progress Notes (Signed)
IVF stopped - pt tol PO diet

## 2023-07-12 NOTE — Plan of Care (Signed)

## 2023-07-12 NOTE — Discharge Summary (Addendum)
Physician Discharge Summary   Patient: Wendy [name] MRN: [id number] DOB: 18-Jul-1934  Admit date:     07/07/2023  Discharge date: 07/12/23  Discharge Physician: Alba Cory   PCP: [name], MD   Recommendations at discharge:    Needs to follow up with Dr Truett Perna for further care of pancreatic cancer.  Needs CBC to follow anemia. She received IV iron.  Needs B 12 in 3 weeks.  Follow up with Surgery to discussed option for Surgery.   Discharge Diagnoses: Principal Problem:   Pancreatic mass Active Problems:   Malnutrition of moderate degree  Resolved Problems:   * No resolved hospital problems. [facility] Course: 89 year old with past medical history significant for A-fib, hypertension, hyperlipidemia presents with nausea, vomiting, constipation, found to have iron deficiency anemia, new pancreatic mass.    Assessment and Plan: 1-Poorly Differentiated Adenocarcinoma Pancreas  Heterogenous Pancreatic Head Mass:  Duodenal Obstruction.  -Patient presented with nausea and constipation. -CT abdomen and pelvis showed 5.3 cm heterogeneously  enhancing expansile mass within the head of the pancreas situated within the pancreaticoduodenal groove likely invading the second portion of the duodenum  abutting  the terminal superior mesenteric vein without invasion and deviation of the main biliary and pancreatic ductal structures. -Underwent upper GI endoscopy 7/9; show large amount of fluid in the stomach, normal duodenal bulb.  Duodenal deformity.  Biopsy.   -Underwent Duodenal Stent placement 7/11 -if she is able to tolerate soft diet, plan to discharge home today.  -CT chest with few small lung nodule, will need follow up/    Hypokalemia:  Replaced   Hyponatremia:  Treated  with IV fluids   B-12 deficiency:  Started on B12 intramuscular injection   Iron deficiency anemia:  IV iron times 2.  Monitor hemoglobin B 12 supplement.  Hb stable.   HTN: Hold  hydrochlorothiazide in the setting of poor oral intake Resume Lotensin.      A fib Continue to hold xarelto. Discussed with Dr [name], ok to hold Xarelto. If her risk for bleeding decreased  after she had Surgery, them will reconsider xarelto.    Constipation; Repeat Dulcolax Supp today    Nutrition;  Albumin 3.1, pre albumin 16. Strat ensure when tolerates diet.               Consultants: GI Dr Ewing Schlein, Dr Truett Perna. General Surgery  Procedures performed: Endoscopy   Disposition: Home Diet recommendation:  Discharge Diet Orders (From admission, onward)     Start     Ordered   07/12/23 0000  Diet - low sodium heart healthy        07/12/23 1145           Diet; soft  DISCHARGE MEDICATION: Allergies as of 07/12/2023       Reactions   Bactrim [sulfamethoxazole-trimethoprim] Nausea And Vomiting, Other (See Comments)   "Knocked her for a loop"- dizziness and weakness, also        Medication List     STOP taking these medications    estradiol 0.1 MG/GM vaginal cream Commonly known as: ESTRACE   FISH OIL PO   hydrochlorothiazide 25 MG tablet Commonly known as: HYDRODIURIL   Vitamin D3 50 MCG (2000 UT) Tabs   Xarelto 15 MG Tabs tablet Generic drug: Rivaroxaban       TAKE these medications    benazepril 20 MG tablet Commonly known as: LOTENSIN Take 20 mg by mouth daily.   cyanocobalamin 500 MCG tablet Commonly  known as: VITAMIN B12 Take 2 tablets (1,000 mcg total) by mouth daily.   docusate sodium 100 MG capsule Commonly known as: COLACE Take 1 capsule (100 mg total) by mouth 2 (two) times daily.   ferrous sulfate 325 (65 FE) MG EC tablet Take 1 tablet (325 mg total) by mouth daily with breakfast.   multivitamin with minerals Tabs tablet Take 1 tablet by mouth daily. Start taking on: July 13, 2023   ondansetron 4 MG tablet Commonly known as: ZOFRAN Take 1 tablet (4 mg total) by mouth every 6 (six) hours as needed for nausea.    pantoprazole 40 MG tablet Commonly known as: PROTONIX Take 1 tablet (40 mg total) by mouth daily. Start taking on: July 13, 2023   polyethylene glycol 17 g packet Commonly known as: MIRALAX / GLYCOLAX Take 17 g by mouth daily as needed for mild constipation.   simvastatin 40 MG tablet Commonly known as: ZOCOR Take 20 mg by mouth daily with supper.        Follow-up Information     [name], MD Follow up on 07/22/2023.   Specialty: General Surgery Why: 940am. Please bring a copy of your photo ID and insurance card. Please arrive 30 minutes prior to your appointment for paperwork. Contact information: [address] [place] [address] [phone]         Ladene Artist, MD Follow up in 1 week(s).   Specialty: Oncology Contact information: [address] [place] [address] [phone]         [name], MD Follow up in 1 week(s).   Specialty: Family Medicine Why: Needs to follow up with PCP whitin 1 week to have your Hemoglobin check. Contact information: [address] [place] [address] [phone]                Discharge Exam: Ceasar Mons Weights   07/07/23 1021 07/08/23 1140 07/10/23 0920  Weight: 54 kg 54 kg 54 kg   General; NAD Condition at discharge: stable  The results of significant diagnostics from this hospitalization (including imaging, microbiology, ancillary and laboratory) are listed below for reference.   Imaging Studies: CT CHEST W CONTRAST  Result Date: 07/11/2023 CLINICAL DATA:  Pancreatic cancer staging. EXAM: CT CHEST WITH CONTRAST TECHNIQUE: Multidetector CT imaging of the chest was performed during intravenous contrast administration. RADIATION DOSE REDUCTION: This exam was performed according to the departmental dose-optimization program which includes automated exposure control, adjustment of the mA and/or kV according to patient size and/or use of iterative reconstruction technique.  CONTRAST:  75mL OMNIPAQUE IOHEXOL 300 MG/ML  SOLN COMPARISON:  CT abdomen and pelvis 07/07/2023 FINDINGS: Cardiovascular: Dilated main pulmonary artery measuring 36 mm in diameter can be seen with pulmonary hypertension. No pericardial effusion. Cardiomegaly. Coronary artery and aortic atherosclerotic calcification. Mediastinum/Nodes: No mediastinal adenopathy. Lungs/Pleura: No focal consolidation, pleural effusion, or pneumothorax. Biapical pleural-parenchymal scarring. Scattered small pulmonary nodules. For example 3 mm pulmonary nodule in the right lower lobe on series 8/image 98; 4 mm nodule in the posterior right lower lobe on series 8/image 124; 6 mm pulmonary nodule in the right lower lobe on 08/121; 4 mm nodule in the posterior left lower lobe on 08/121. Upper Abdomen: No acute abnormality. Musculoskeletal: No acute fracture or destructive osseous lesion. IMPRESSION: 1. Scattered small pulmonary nodules measuring up to 6 mm are indeterminate. Follow-up as clinically indicated given known pancreatic cancer. 2. Dilated main pulmonary artery can be seen with pulmonary hypertension. Aortic Atherosclerosis (  ICD10-I70.0). Electronically Signed   By: Minerva Fester M.D.   On: 07/11/2023 23:13   DG C-Arm 1-60 Min-No Report  Result Date: 07/10/2023 Fluoroscopy was utilized by the requesting physician.  No radiographic interpretation.   CT ABDOMEN PELVIS W CONTRAST  Result Date: 07/07/2023 CLINICAL DATA:  Abdominal pain, acute, nonlocalized EXAM: CT ABDOMEN AND PELVIS WITH CONTRAST TECHNIQUE: Multidetector CT imaging of the abdomen and pelvis was performed using the standard protocol following bolus administration of intravenous contrast. RADIATION DOSE REDUCTION: This exam was performed according to the departmental dose-optimization program which includes automated exposure control, adjustment of the mA and/or kV according to patient size and/or use of iterative reconstruction technique. CONTRAST:  75mL  OMNIPAQUE IOHEXOL 300 MG/ML  SOLN COMPARISON:  None Available. FINDINGS: Lower chest: Visualized lung bases are clear. Mild cardiomegaly. No acute abnormality. Hepatobiliary: Mild hepatic steatosis. No enhancing intrahepatic mass. No intra or extrahepatic biliary ductal dilation. Gallbladder unremarkable. Pancreas: There is a heterogeneously enhancing expansile mass within the head of the pancreas measuring at least 3.9 x 5.3 by 3.4 cm in dimension, situated within the pancreatico duodenal groove likely invading the second portion of the duodenum, best seen on image # 30/2, and abutting the terminal superior mesenteric vein without invasion or encasement, best seen on image # 30-32/2 and coronal image # 44/8. The main pancreatic duct is deviated superiorly, as is the distal common duct, but does not appear dilated. Focal pancreatitis is a consideration though is considered less likely given the lack of significant surrounding inflammatory stranding and apparent invasion of the second portion of the duodenum. Single enlarged periportal lymph node is seen at axial image # 24/2 measuring 11 mm in short axis diameter. Normal enhancement and preserved parenchyma involving of the body and tail the pancreas. No peripancreatic fluid collections. Spleen: Unremarkable Adrenals/Urinary Tract: The adrenal glands are unremarkable. The kidneys are normal in size and position. Punctate 1-2 mm nonobstructing calculi are seen within the kidneys bilaterally. No ureteral calculi. No hydronephrosis. No enhancing intrarenal masses. Simple cortical cyst within the interpolar region of the left kidney for which no follow-up imaging is recommended. The bladder is unremarkable. Stomach/Bowel: The stomach and proximal duodenum appears fluid-filled and distended suggesting at least partial obstruction related to the mass involving the second portion of the duodenum. The stomach, small bowel, and large bowel are otherwise unremarkable. The  appendix is normal. No free intraperitoneal gas or fluid. Vascular/Lymphatic: Moderate aortoiliac atherosclerotic calcification. No aortic aneurysm. Superior mesenteric artery and common hepatic artery are separate from the above-mentioned pancreatic mass. No additional pathologic adenopathy within the abdomen and pelvis. Reproductive: Uterus and bilateral adnexa are unremarkable. Other: No abdominal wall hernia Musculoskeletal: Degenerative changes are seen within the lumbar spine and hips bilaterally. No acute bone abnormality. No lytic or blastic bone lesion. IMPRESSION: 1. 5.3 cm heterogeneously enhancing expansile mass within the head of the pancreas, situated within the pancreatico duodenal groove likely invading the second portion of the duodenum and abutting the terminal superior mesenteric vein without invasion or encasement, and deviation of the main biliary and pancreatic ductal structures. Findings are concerning for pancreatic adenocarcinoma. While focal pancreatitis is a consideration, this is considered less likely given the evidence of mass effect and mural invasion. Correlation with CA 19-9 level as well as endoscopic ultrasound and tissue sampling is recommended. 2. Fluid-filled and distended stomach and proximal duodenum suggesting at least partial obstruction related to the mass involving the second portion of the duodenum. 3. Mild hepatic steatosis. 4.  Bilateral minimal nonobstructing nephrolithiasis. 5. Mild cardiomegaly. Aortic Atherosclerosis (ICD10-I70.0). Electronically Signed   By: Helyn Numbers M.D.   On: 07/07/2023 14:09   DG Chest 2 View  Result Date: 07/07/2023 CLINICAL DATA:  Dyspnea with exertion. EXAM: CHEST - 2 VIEW COMPARISON:  None Available. FINDINGS: Mild cardiomegaly. Both lungs are clear. The visualized skeletal structures are unremarkable. IMPRESSION: No active cardiopulmonary disease. Aortic Atherosclerosis (ICD10-I70.0). Electronically Signed   By: Lupita Raider M.D.    On: 07/07/2023 11:37    Microbiology: Results for orders placed or performed during the hospital encounter of 07/07/23  SARS Coronavirus 2 by RT PCR (hospital order, performed in [facility] hospital lab) *cepheid single result test* Anterior Nasal Swab     Status: None   Collection Time: 07/07/23 11:00 AM   Specimen: Anterior Nasal Swab  Result Value Ref Range Status   SARS Coronavirus 2 by RT PCR NEGATIVE NEGATIVE Final    Comment: (NOTE) SARS-CoV-2 target nucleic acids are NOT DETECTED.  The SARS-CoV-2 RNA is generally detectable in upper and lower respiratory specimens during the acute phase of infection. The lowest concentration of SARS-CoV-2 viral copies this assay can detect is 250 copies / mL. A negative result does not preclude SARS-CoV-2 infection and should not be used as the sole basis for treatment or other patient management decisions.  A negative result may occur with improper specimen collection / handling, submission of specimen other than nasopharyngeal swab, presence of viral mutation(s) within the areas targeted by this assay, and inadequate number of viral copies (<250 copies / mL). A negative result must be combined with clinical observations, patient history, and epidemiological information.  Fact Sheet for Patients:   RoadLapTop.co.za  Fact Sheet for Healthcare Providers: http://kim-miller.com/  This test is not yet approved or  cleared by the [place] FDA and has been authorized for detection and/or diagnosis of SARS-CoV-2 by FDA under an Emergency Use Authorization (EUA).  This EUA will remain in effect (meaning this test can be used) for the duration of the COVID-19 declaration under Section 564(b)(1) of the Act, 21 U.S.C. section 360bbb-3(b)(1), unless the authorization is terminated or revoked sooner.  Performed at [facility], [address], [place], [place] [address]   Urine  Culture     Status: Abnormal   Collection Time: 07/07/23  2:25 PM   Specimen: Urine, Clean Catch  Result Value Ref Range Status   Specimen Description   Final    URINE, CLEAN CATCH Performed at [facility], [address], [place], [place] [address]    Special Requests   Final    NONE Performed at [facility], [address] [place], [place] [address]    Culture 30,000 COLONIES/mL ESCHERICHIA COLI (A)  Final   Report Status 07/09/2023 FINAL  Final   Organism ID, Bacteria ESCHERICHIA COLI (A)  Final      Susceptibility   Escherichia coli - MIC*    AMPICILLIN <=2 SENSITIVE Sensitive     CEFAZOLIN <=4 SENSITIVE Sensitive     CEFEPIME <=0.12 SENSITIVE Sensitive     CEFTRIAXONE <=0.25 SENSITIVE Sensitive     CIPROFLOXACIN <=0.25 SENSITIVE Sensitive     GENTAMICIN <=1 SENSITIVE Sensitive     IMIPENEM <=0.25 SENSITIVE Sensitive     NITROFURANTOIN <=16 SENSITIVE Sensitive     TRIMETH/SULFA >=320 RESISTANT Resistant     AMPICILLIN/SULBACTAM <=2 SENSITIVE Sensitive     PIP/TAZO <=4 SENSITIVE Sensitive     *  30,000 COLONIES/mL ESCHERICHIA COLI    Labs: CBC: Recent Labs  Lab 07/07/23 1038 07/08/23 0359 07/09/23 0431 07/10/23 0414 07/11/23 0748  WBC 5.2 4.3 3.5* 4.7 5.2  NEUTROABS 3.5  --   --   --   --   HGB 7.0* 7.6* 7.6* 7.9* 7.7*  HCT 22.2* 23.8* 23.8* 25.5* 24.7*  MCV 98.2 97.1 95.2 97.7 98.0  PLT 283 213 219 224 219   Basic Metabolic Panel: Recent Labs  Lab 07/07/23 1038 07/08/23 0359 07/09/23 0431 07/10/23 0414  NA 135 133* 136 139  K 2.8* 3.4* 3.4* 4.2  CL 97* 101 104 107  CO2 29 23 26 26   GLUCOSE 141* 102* 105* 116*  BUN 33* 26* 16 18  CREATININE 0.91 0.79 0.67 0.74  CALCIUM 10.7* 9.1 8.8* 9.1   Liver Function Tests: Recent Labs  Lab 07/07/23 1038 07/10/23 0414  AST 26 20  ALT 18 14  ALKPHOS 52 48  BILITOT 0.5 0.6  PROT 6.6 5.3*  ALBUMIN 3.8 3.1*   CBG: No results for input(s): "GLUCAP" in the last 168  hours.  Discharge time spent: greater than 30 minutes.  Signed: Alba Cory, MD [facility] 07/12/2023

## 2023-07-12 NOTE — Plan of Care (Signed)
Patient awake, verbalizes she has rested well, denies pain. No shortness of breath. Full liquids well tolerated. Continue Iv fluids. Needs attended. BM x2 this shift. Maintained safety throughout this shift.  Problem: Education: Goal: Knowledge of General Education information will improve Description: Including pain rating scale, medication(s)/side effects and non-pharmacologic comfort measures Outcome: Progressing   Problem: Clinical Measurements: Goal: Ability to maintain clinical measurements within normal limits will improve Outcome: Progressing   Problem: Activity: Goal: Risk for activity intolerance will decrease Outcome: Progressing   Problem: Nutrition: Goal: Adequate nutrition will be maintained Outcome: Progressing   Problem: Coping: Goal: Level of anxiety will decrease Outcome: Progressing   Problem: Elimination: Goal: Will not experience complications related to bowel motility Outcome: Progressing   Problem: Pain Managment: Goal: General experience of comfort will improve Outcome: Progressing   Problem: Safety: Goal: Ability to remain free from injury will improve Outcome: Progressing

## 2023-07-14 ENCOUNTER — Other Ambulatory Visit: Payer: Self-pay | Admitting: *Deleted

## 2023-07-14 ENCOUNTER — Encounter: Payer: Self-pay | Admitting: *Deleted

## 2023-07-14 DIAGNOSIS — K8689 Other specified diseases of pancreas: Secondary | ICD-10-CM

## 2023-07-14 NOTE — Progress Notes (Signed)
PATIENT NAVIGATOR PROGRESS NOTE  Name: SHANTA HARTNER Date: 07/14/2023 MRN: [id number]  DOB: 1934-09-22   Reason for visit:  Introductory phone call  Comments:  Called and spoke with Rosey Bath, Ms Hilyer's daughter. Discussed upcoming appts with surgeon Dr [name] and Dr Truett Perna. Informed regarding Genetics referral and referral and lab order placed. She is seeing PCP Dr Hyacinth Meeker this week for hospital F/U  Daughter given contact number to call with any questions    Time spent counseling/coordinating care: 30-45 minutes

## 2023-07-15 LAB — SURGICAL PATHOLOGY

## 2023-07-16 ENCOUNTER — Other Ambulatory Visit: Payer: Self-pay

## 2023-07-16 ENCOUNTER — Encounter: Payer: Self-pay | Admitting: *Deleted

## 2023-07-16 DIAGNOSIS — C250 Malignant neoplasm of head of pancreas: Secondary | ICD-10-CM | POA: Diagnosis not present

## 2023-07-16 DIAGNOSIS — I10 Essential (primary) hypertension: Secondary | ICD-10-CM | POA: Diagnosis not present

## 2023-07-16 DIAGNOSIS — I4891 Unspecified atrial fibrillation: Secondary | ICD-10-CM | POA: Diagnosis not present

## 2023-07-16 DIAGNOSIS — K315 Obstruction of duodenum: Secondary | ICD-10-CM | POA: Diagnosis not present

## 2023-07-16 DIAGNOSIS — R7303 Prediabetes: Secondary | ICD-10-CM | POA: Diagnosis not present

## 2023-07-16 NOTE — Progress Notes (Signed)
 The proposed treatment discussed in conference is for discussion purpose only and is not a binding recommendation.  The patients have not been physically examined, or presented with their treatment options.  Therefore, final treatment plans cannot be decided.  

## 2023-07-16 NOTE — Progress Notes (Signed)
PATIENT NAVIGATOR PROGRESS NOTE  Name: Wendy [name] Date: 07/16/2023 MRN: [id number]  DOB: 05/19/1934   Reason for visit:  F/U phone call after GI Conference  Comments:  Called daughter Wendy [name] and discussed GI conference recommendations and what to expect with next week appt with Dr Truett Perna.  Discussed neoadjuvant chemo and possible PAC placement as well.  Daughter reports that pt is eating well, drinking fluids and doing light housework.    Pt has appt with Dr [name] next week prior to appt with Dr Truett Perna  Reinforced contact information to call with any issues or concerns    Time spent counseling/coordinating care: 45-60 minutes

## 2023-07-18 ENCOUNTER — Telehealth: Payer: Self-pay | Admitting: Oncology

## 2023-07-18 ENCOUNTER — Encounter: Payer: Self-pay | Admitting: *Deleted

## 2023-07-18 DIAGNOSIS — C250 Malignant neoplasm of head of pancreas: Secondary | ICD-10-CM | POA: Diagnosis not present

## 2023-07-18 DIAGNOSIS — L270 Generalized skin eruption due to drugs and medicaments taken internally: Secondary | ICD-10-CM | POA: Diagnosis not present

## 2023-07-18 DIAGNOSIS — Z6820 Body mass index (BMI) 20.0-20.9, adult: Secondary | ICD-10-CM | POA: Diagnosis not present

## 2023-07-18 NOTE — Progress Notes (Signed)
PATIENT NAVIGATOR PROGRESS NOTE  Name: RAYEN PALEN Date: 07/18/2023 MRN: [id number]  DOB: April 13, 1934   Reason for visit:  Telephone call regarding new rash  Comments:  Ms Pryde's daughter Aggie Cosier called regarding Ms Canty developing rash over abdomen and chest and arms. Ms Balles states that it does not itch, she is not experiencing any trouble swallowing, swelling of face or lips. The rash blanches per pt    They state the only new medications are the Protonix, and Ferrous Sulfate which she started in the hospital.    She saw her PCP on Wednesday with lab work and Hgb was 8.5 and Plt 299 and WBC 4.9 reported by pt.  Instructed pt to call PCP and see if she can be seen today for evaluation Verbalized understanding      Time spent counseling/coordinating care: 30-45 minutes

## 2023-07-18 NOTE — Telephone Encounter (Signed)
Patient has currently declined the appointment with the [facility]; patient stated they wanted to talk further with their provider before coming to that decision.

## 2023-07-22 DIAGNOSIS — C250 Malignant neoplasm of head of pancreas: Secondary | ICD-10-CM | POA: Diagnosis not present

## 2023-07-24 ENCOUNTER — Encounter: Payer: Self-pay | Admitting: Oncology

## 2023-07-24 ENCOUNTER — Encounter: Payer: Self-pay | Admitting: *Deleted

## 2023-07-24 ENCOUNTER — Inpatient Hospital Stay: Payer: Medicare Other

## 2023-07-24 ENCOUNTER — Other Ambulatory Visit: Payer: Medicare Other

## 2023-07-24 ENCOUNTER — Inpatient Hospital Stay: Payer: Medicare Other | Attending: Oncology | Admitting: Oncology

## 2023-07-24 VITALS — BP 140/83 | HR 100 | Temp 97.9°F | Resp 18 | Ht 66.0 in | Wt 124.5 lb

## 2023-07-24 DIAGNOSIS — I4891 Unspecified atrial fibrillation: Secondary | ICD-10-CM | POA: Insufficient documentation

## 2023-07-24 DIAGNOSIS — Z800 Family history of malignant neoplasm of digestive organs: Secondary | ICD-10-CM | POA: Diagnosis not present

## 2023-07-24 DIAGNOSIS — Z7901 Long term (current) use of anticoagulants: Secondary | ICD-10-CM | POA: Insufficient documentation

## 2023-07-24 DIAGNOSIS — K8689 Other specified diseases of pancreas: Secondary | ICD-10-CM | POA: Diagnosis not present

## 2023-07-24 DIAGNOSIS — D509 Iron deficiency anemia, unspecified: Secondary | ICD-10-CM | POA: Diagnosis not present

## 2023-07-24 DIAGNOSIS — C250 Malignant neoplasm of head of pancreas: Secondary | ICD-10-CM | POA: Diagnosis present

## 2023-07-24 DIAGNOSIS — E538 Deficiency of other specified B group vitamins: Secondary | ICD-10-CM | POA: Insufficient documentation

## 2023-07-24 DIAGNOSIS — R918 Other nonspecific abnormal finding of lung field: Secondary | ICD-10-CM | POA: Diagnosis not present

## 2023-07-24 LAB — CBC WITH DIFFERENTIAL (CANCER CENTER ONLY)
Abs Immature Granulocytes: 0.02 10*3/uL (ref 0.00–0.07)
Basophils Absolute: 0 10*3/uL (ref 0.0–0.1)
Basophils Relative: 1 %
Eosinophils Absolute: 0.1 10*3/uL (ref 0.0–0.5)
Eosinophils Relative: 2 %
HCT: 30.5 % — ABNORMAL LOW (ref 36.0–46.0)
Hemoglobin: 9.5 g/dL — ABNORMAL LOW (ref 12.0–15.0)
Immature Granulocytes: 0 %
Lymphocytes Relative: 15 %
Lymphs Abs: 0.8 10*3/uL (ref 0.7–4.0)
MCH: 30.9 pg (ref 26.0–34.0)
MCHC: 31.1 g/dL (ref 30.0–36.0)
MCV: 99.3 fL (ref 80.0–100.0)
Monocytes Absolute: 0.4 10*3/uL (ref 0.1–1.0)
Monocytes Relative: 8 %
Neutro Abs: 4.1 10*3/uL (ref 1.7–7.7)
Neutrophils Relative %: 74 %
Platelet Count: 302 10*3/uL (ref 150–400)
RBC: 3.07 MIL/uL — ABNORMAL LOW (ref 3.87–5.11)
RDW: 15.8 % — ABNORMAL HIGH (ref 11.5–15.5)
WBC Count: 5.5 10*3/uL (ref 4.0–10.5)
nRBC: 0 % (ref 0.0–0.2)

## 2023-07-24 MED ORDER — PROCHLORPERAZINE MALEATE 5 MG PO TABS: 5.0000 mg | ORAL_TABLET | Freq: Four times a day (QID) | ORAL | 2 refills | Status: DC | PRN

## 2023-07-24 MED ORDER — LIDOCAINE-PRILOCAINE 2.5-2.5 % EX CREA: 1.0000 | TOPICAL_CREAM | CUTANEOUS | 0 refills | Status: DC | PRN

## 2023-07-24 NOTE — Progress Notes (Signed)
START ON PATHWAY REGIMEN - Pancreatic Adenocarcinoma     A cycle is every 28 days:     Nab-paclitaxel (protein bound)      Gemcitabine   **Always confirm dose/schedule in your pharmacy ordering system**  Patient Characteristics: Preoperative, M0 (Clinical Staging), Borderline Resectable, PS = 0,1, BRCA1/2 and PALB2 Mutation Absent/Unknown Therapeutic Status: Preoperative, M0 (Clinical Staging) AJCC T Category: cT3 AJCC N Category: cN0 Resectability Status: Borderline Resectable AJCC M Category: cM0 AJCC 8 Stage Grouping: IIA ECOG Performance Status: 0 BRCA1/2 Mutation Status: Awaiting Test Results PALB2 Mutation Status: Awaiting Test Results Intent of Therapy: Curative Intent, Discussed with Patient

## 2023-07-24 NOTE — Progress Notes (Signed)
[facility] OFFICE PROGRESS NOTE   Diagnosis: Pancreas cancer  INTERVAL HISTORY:   Ms. Cudney returns as scheduled.  She feels well.  No nausea/vomiting or abdominal pain.  She is tolerating a diet.  She developed a rash in the hospital.  The rash is fading she reports Dr. Hyacinth Meeker feels the rash may be related to Protonix.  She was changed to famotidine.  Objective:  Vital signs in last 24 hours:  Blood pressure (!) 140/83, pulse 100, temperature 97.9 F (36.6 C), temperature source Oral, resp. rate 18, height 5\' 6"  (1.676 m), weight 124 lb 8 oz (56.5 kg), SpO2 100%.   Resp: Lungs clear bilaterally Cardio: Irregular GI: No hepatosplenomegaly, mild tenderness associated with soft rounded fullness in the right upper abdomen Vascular: No leg edema, trace ankle edema bilaterally   Lab Results:  Lab Results  Component Value Date   WBC 5.5 07/24/2023   HGB 9.5 (L) 07/24/2023   HCT 30.5 (L) 07/24/2023   MCV 99.3 07/24/2023   PLT 302 07/24/2023   NEUTROABS 4.1 07/24/2023    CMP  Lab Results  Component Value Date   NA 139 07/10/2023   K 4.2 07/10/2023   CL 107 07/10/2023   CO2 26 07/10/2023   GLUCOSE 116 (H) 07/10/2023   BUN 18 07/10/2023   CREATININE 0.74 07/10/2023   CALCIUM 9.1 07/10/2023   PROT 5.3 (L) 07/10/2023   ALBUMIN 3.1 (L) 07/10/2023   AST 20 07/10/2023   ALT 14 07/10/2023   ALKPHOS 48 07/10/2023   BILITOT 0.6 07/10/2023   GFRNONAA >60 07/10/2023    Lab Results  Component Value Date   ZOX096 502 (H) 07/10/2023    Medications: I have reviewed the patient's current medications.   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  Fluid-filled and  distended stomach and proximal duodenum, single 11 mm periportal lymph node EGD 07/08/2023-large food residue in the stomach, extrinsic deformity at the first portion of the duodenum-biopsied Elevated CA 19-9 CT chest 07/11/2023-scattered small pulmonary nodules-indeterminate 2.   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   Anemia 4.   Iron deficiency 5.   B12 deficiency 6.   Atrial fibrillation-maintained on Xarelto prior to hospital admission 7.  Family history of pancreas and uterine cancer 8.  Hyperlipidemia     Disposition: Ms. Sedlak has been diagnosed with pancreas cancer.  She appears to have locally advanced, potentially resectable disease.  He saw Dr. [name] yesterday.  Dr. [name] recommends a course of neoadjuvant chemotherapy.  I discussed chemotherapy options with Ms. Gavina and her family.  I think it would be difficult for her to tolerate FOLFIRINOX.  I recommend gemcitabine/Abraxane.  We reviewed potential toxicities associated with the gemcitabine/Abraxane chemotherapy regimen including the chance of nausea, alopecia, hematologic toxicity, infection, and bleeding.  We discussed the fever, rash, and pneumonitis associated with gemcitabine.  We discussed the neuropathy seen with Abraxane.  She agrees to proceed.  She will attend a chemotherapy teaching class.  We will ask Dr. [name] to place a Port-A-Cath.  The plan is to begin chemotherapy on 08/05/2023.  A chemotherapy plan was entered today.  I reviewed the CT abdomen/pelvis and chest images with Ms. Luster and her family.  There are several small indeterminate lung nodules.  We will follow these nodules on a restaging CT after approximately 6 cycles of gemcitabine/Abraxane.  We will check a baseline CA 19-9 on 08/05/2023.  Ms. Rosero has a significant family history of pancreas cancer.  Peripheral blood was submitted for genetic testing today.  We will consider  altering the chemotherapy regimen if genetic testing reveals an expected increased response to platinum based therapy.  Iron deficiency anemia has improved.  She continues iron replacement.  [name], MD  07/24/2023  12:56 PM

## 2023-07-24 NOTE — Progress Notes (Signed)
PATIENT NAVIGATOR PROGRESS NOTE  Name: Wendy [name] Date: 07/24/2023 MRN: [id number]  DOB: Jul 12, 1934   Reason for visit:  New pt appt  Comments:  Met with [name] and her daughter and son in law during visit with Dr Truett Perna  Provided [facility] education with demonstration, and written information Referral to SW Referral to Nutrition Referral to [place], AMBR kit drawn today Journeys notebook provided with disease specific information and handouts of Gemzar/Abraxane Given contact number to call with any issues or questions    Time spent counseling/coordinating care: > 60 minutes

## 2023-07-25 ENCOUNTER — Telehealth: Payer: Self-pay | Admitting: Family Medicine

## 2023-07-25 ENCOUNTER — Telehealth: Payer: Self-pay

## 2023-07-25 ENCOUNTER — Other Ambulatory Visit: Payer: Self-pay

## 2023-07-25 ENCOUNTER — Inpatient Hospital Stay: Payer: Medicare Other

## 2023-07-25 NOTE — Telephone Encounter (Signed)
Per patient's chart, attempted to contact her daughter, Aggie Cosier.  Left vm.

## 2023-07-25 NOTE — Telephone Encounter (Signed)
Patient and patient's daughter is aware of upcoming appointment with the [facility]

## 2023-07-28 NOTE — Progress Notes (Signed)
Pharmacist Chemotherapy Monitoring - Initial Assessment    Anticipated start date: 08/05/23   The following has been reviewed per standard work regarding the patient's treatment regimen: The patient's diagnosis, treatment plan and drug doses, and organ/hematologic function Lab orders and baseline tests specific to treatment regimen  The treatment plan start date, drug sequencing, and pre-medications Prior authorization status  Patient's documented medication list, including drug-drug interaction screen and prescriptions for anti-emetics and supportive care specific to the treatment regimen The drug concentrations, fluid compatibility, administration routes, and timing of the medications to be used The patient's access for treatment and lifetime cumulative dose history, if applicable  The patient's medication allergies and previous infusion related reactions, if applicable   Changes made to treatment plan:  N/A  Follow up needed:  N/A   Daylene Katayama, RPH, 07/28/2023  4:05 PM

## 2023-07-29 ENCOUNTER — Ambulatory Visit: Payer: Self-pay | Admitting: Surgery

## 2023-07-29 NOTE — Progress Notes (Signed)
Sent message, via epic in basket, requesting orders in epic from surgeon.  

## 2023-07-30 NOTE — Patient Instructions (Signed)
SURGICAL WAITING ROOM VISITATION  Patients having surgery or a procedure may have no more than 2 support people in the waiting area - these visitors may rotate.    Children under the age of 16 must have an adult with them who is not the patient.  Due to an increase in RSV and influenza rates and associated hospitalizations, children ages 41 and under may not visit patients in [facility] hospitals.  If the patient needs to stay at the hospital during part of their recovery, the visitor guidelines for inpatient rooms apply. Pre-op nurse will coordinate an appropriate time for 1 support person to accompany patient in pre-op.  This support person may not rotate.    Please refer to the [facility] website for the visitor guidelines for Inpatients (after your surgery is over and you are in a regular room).       Your procedure is scheduled on: 08/04/23   Report to [facility] Main Entrance    Report to admitting at 7:15 AM   Call this number if you have problems the morning of surgery [phone]   Do not eat food :After Midnight.   After Midnight you may have the following liquids until 6:30 AM DAY OF SURGERY  Water Non-Citrus Juices (without pulp, NO RED-Apple, White grape, White cranberry) Black Coffee (NO MILK/CREAM OR CREAMERS, sugar ok)  Clear Tea (NO MILK/CREAM OR CREAMERS, sugar ok) regular and decaf                             Plain Jell-O (NO RED)                                           Fruit ices (not with fruit pulp, NO RED)                                     Popsicles (NO RED)                                                               Sports drinks like Gatorade (NO RED)                    Oral Hygiene is also important to reduce your risk of infection.                                    Remember - BRUSH YOUR TEETH THE MORNING OF SURGERY WITH YOUR REGULAR TOOTHPASTE  DENTURES WILL BE REMOVED PRIOR TO SURGERY PLEASE DO NOT APPLY "Poly grip" OR  ADHESIVES!!!   Do NOT smoke after Midnight   Stop all vitamins and herbal supplements 7 days before surgery.   Take these medicines the morning of surgery : Tylenol, Famotidine, Simvastatin             You may not have any metal on your body including hair pins, jewelry, and body piercing  Do not wear make-up, lotions, powders, perfumes, or deodorant  Do not wear nail polish including gel and S&S, artificial/acrylic nails, or any other type of covering on natural nails including finger and toenails. If you have artificial nails, gel coating, etc. that needs to be removed by a nail salon please have this removed prior to surgery or surgery may need to be canceled/ delayed if the surgeon/ anesthesia feels like they are unable to be safely monitored.   Do not shave  48 hours prior to surgery.    Do not bring valuables to the hospital. [facility] IS NOT             RESPONSIBLE   FOR VALUABLES.   Contacts, glasses, dentures or bridgework may not be worn into surgery.  DO NOT BRING YOUR HOME MEDICATIONS TO THE HOSPITAL. PHARMACY WILL DISPENSE MEDICATIONS LISTED ON YOUR MEDICATION LIST TO YOU DURING YOUR ADMISSION IN THE HOSPITAL!    Patients discharged on the day of surgery will not be allowed to drive home.  Someone NEEDS to stay with you for the first 24 hours after anesthesia.   Special Instructions: Bring a copy of your healthcare power of attorney and living will documents the day of surgery if you haven't scanned them before.              Please read over the following fact sheets you were given: IF YOU HAVE QUESTIONS ABOUT YOUR PRE-OP INSTRUCTIONS PLEASE CALL [phone] [facility]   If you received a COVID test during your pre-op visit  it is requested that you wear a mask when out in public, stay away from anyone that may not be feeling well and notify your surgeon if you develop symptoms. If you test positive for Covid or have been in contact with anyone that has tested  positive in the last 10 days please notify you surgeon.    [facility] - Preparing for Surgery Before surgery, you can play an important role.  Because skin is not sterile, your skin needs to be as free of germs as possible.  You can reduce the number of germs on your skin by washing with CHG (chlorahexidine gluconate) soap before surgery.  CHG is an antiseptic cleaner which kills germs and bonds with the skin to continue killing germs even after washing. Please DO NOT use if you have an allergy to CHG or antibacterial soaps.  If your skin becomes reddened/irritated stop using the CHG and inform your nurse when you arrive at Short Stay. Do not shave (including legs and underarms) for at least 48 hours prior to the first CHG shower.  You may shave your face/neck.  Please follow these instructions carefully:  1.  Shower with CHG Soap the night before surgery and the  morning of surgery.  2.  If you choose to wash your hair, wash your hair first as usual with your normal  shampoo.  3.  After you shampoo, rinse your hair and body thoroughly to remove the shampoo.                             4.  Use CHG as you would any other liquid soap.  You can apply chg directly to the skin and wash.  Gently with a scrungie or clean washcloth.  5.  Apply the CHG Soap to your body ONLY FROM THE NECK DOWN.   Do   not use on face/ open  Wound or open sores. Avoid contact with eyes, ears mouth and   genitals (private parts).                       Wash face,  Genitals (private parts) with your normal soap.             6.  Wash thoroughly, paying special attention to the area where your    surgery  will be performed.  7.  Thoroughly rinse your body with warm water from the neck down.  8.  DO NOT shower/wash with your normal soap after using and rinsing off the CHG Soap.                9.  Pat yourself dry with a clean towel.            10.  Wear clean pajamas.            11.  Place clean sheets  on your bed the night of your first shower and do not  sleep with pets. Day of Surgery : Do not apply any lotions/deodorants the morning of surgery.  Please wear clean clothes to the hospital/surgery center.  FAILURE TO FOLLOW THESE INSTRUCTIONS MAY RESULT IN THE CANCELLATION OF YOUR SURGERY  PATIENT SIGNATURE_________________________________  NURSE SIGNATURE__________________________________  ________________________________________________________________________

## 2023-07-30 NOTE — Progress Notes (Signed)
COVID Vaccine received:  []  No [x]  Yes Date of any COVID positive Test in last 90 days: No PCP - [name] MD Cardiologist -   Chest x-ray - chest CT 07/11/23 Epic EKG -  07/08/23 Epic Stress Test -  ECHO - 10/14/19 Epic Cardiac Cath - no  Bowel Prep - [x]  No  []   Yes ______  Pacemaker / ICD device [x]  No []  Yes   Spinal Cord Stimulator:[x]  No []  Yes       History of Sleep Apnea? [x]  No []  Yes   CPAP used?- [x]  No []  Yes    Does the patient monitor blood sugar?          [x]  No []  Yes  []  N/A  Patient has: [x]  NO Hx DM   []  Pre-DM                 []  DM1  []   DM2 Does patient have a [employer] or Dexacom? []  No []  Yes   Fasting Blood Sugar Ranges-  Checks Blood Sugar _____ times a day  GLP1 agonist / usual dose - No GLP1 instructions:  SGLT-2 inhibitors / usual dose - No SGLT-2 instructions:   Blood Thinner / Instructions:Stopped Xarelto 3 weeks ago. Aspirin Instructions:No  Comments:   Activity level: Patient is able  to climb a flight of stairs without difficulty; [x]  No CP  [x]  No SOB, but would have ___   Patient can perform ADLs without assistance.   Anesthesia review: A-fib, HTN, Aortic atheroscleosis, Pancreatic CA  Patient denies shortness of breath, fever, cough and chest pain at PAT appointment.  Patient verbalized understanding and agreement to the Pre-Surgical Instructions that were given to them at this PAT appointment. Patient was also educated of the need to review these PAT instructions again prior to his/her surgery.I reviewed the appropriate phone numbers to call if they have any and questions or concerns.

## 2023-07-31 ENCOUNTER — Encounter (HOSPITAL_COMMUNITY): Payer: Self-pay

## 2023-07-31 ENCOUNTER — Other Ambulatory Visit: Payer: Self-pay

## 2023-07-31 ENCOUNTER — Encounter: Payer: Self-pay | Admitting: Genetic Counselor

## 2023-07-31 ENCOUNTER — Inpatient Hospital Stay: Payer: Medicare Other | Attending: Oncology | Admitting: Genetic Counselor

## 2023-07-31 ENCOUNTER — Encounter (HOSPITAL_COMMUNITY)
Admission: RE | Admit: 2023-07-31 | Discharge: 2023-07-31 | Disposition: A | Discharge status: Home / Self Care | Payer: Medicare Other | Source: Ambulatory Visit | Attending: Surgery | Admitting: Surgery

## 2023-07-31 VITALS — BP 138/61 | HR 82 | Temp 97.7°F | Resp 16 | Ht 66.0 in | Wt 119.0 lb

## 2023-07-31 DIAGNOSIS — Z808 Family history of malignant neoplasm of other organs or systems: Secondary | ICD-10-CM

## 2023-07-31 DIAGNOSIS — C250 Malignant neoplasm of head of pancreas: Secondary | ICD-10-CM | POA: Insufficient documentation

## 2023-07-31 DIAGNOSIS — Z800 Family history of malignant neoplasm of digestive organs: Secondary | ICD-10-CM | POA: Insufficient documentation

## 2023-07-31 DIAGNOSIS — Z01818 Encounter for other preprocedural examination: Secondary | ICD-10-CM | POA: Insufficient documentation

## 2023-07-31 DIAGNOSIS — Z8049 Family history of malignant neoplasm of other genital organs: Secondary | ICD-10-CM | POA: Diagnosis not present

## 2023-07-31 DIAGNOSIS — I10 Essential (primary) hypertension: Secondary | ICD-10-CM

## 2023-07-31 DIAGNOSIS — Z5111 Encounter for antineoplastic chemotherapy: Secondary | ICD-10-CM | POA: Insufficient documentation

## 2023-07-31 HISTORY — DX: Unspecified osteoarthritis, unspecified site: M19.90

## 2023-07-31 LAB — CBC
HCT: 33.7 % — ABNORMAL LOW (ref 36.0–46.0)
Hemoglobin: 10.3 g/dL — ABNORMAL LOW (ref 12.0–15.0)
MCH: 31.1 pg (ref 26.0–34.0)
MCHC: 30.6 g/dL (ref 30.0–36.0)
MCV: 101.8 fL — ABNORMAL HIGH (ref 80.0–100.0)
Platelets: 310 10*3/uL (ref 150–400)
RBC: 3.31 MIL/uL — ABNORMAL LOW (ref 3.87–5.11)
RDW: 15.4 % (ref 11.5–15.5)
WBC: 6.1 10*3/uL (ref 4.0–10.5)
nRBC: 0 % (ref 0.0–0.2)

## 2023-07-31 NOTE — Progress Notes (Signed)
REFERRING PROVIDER: Ladene Artist, MD [address] [place],  [place] [address]  PRIMARY PROVIDER:  [name], MD  PRIMARY REASON FOR VISIT:  1. Family history of pancreatic cancer   2. Family history of melanoma   3. Family history of uterine cancer   4. Cancer of head of pancreas (HCC)      HISTORY OF PRESENT ILLNESS:   Wendy [name], a 89 y.o. female, was seen for a [facility] cancer genetics consultation at the request of Dr. Truett Perna due to a personal and family history of cancer.  [name] presents to clinic today to discuss the possibility of a hereditary predisposition to cancer, genetic testing, and to further clarify her future cancer risks, as well as potential cancer risks for family members.   In July 2024, at the age of 76, [name] was diagnosed with pancreatic cancer. The treatment plan includes chemotherapy and surgery.      CANCER HISTORY:  Oncology History  Cancer of head of pancreas (HCC)  07/24/2023 Initial Diagnosis   Cancer of head of pancreas (HCC)   07/24/2023 Cancer Staging   Staging form: Exocrine Pancreas, AJCC 8th Edition - Clinical: Stage IIA (cT3, cN0, cM0) - Signed by Ladene Artist, MD on 07/24/2023 Total positive nodes: 0   08/05/2023 -  Chemotherapy   Patient is on Treatment Plan : PANCREATIC Abraxane D1,8,15 + Gemcitabine D1,8,15 q28d        RISK FACTORS:  Menarche was at age 57.  First live birth at age 25.  OCP use for approximately 1 years.  Ovaries intact: yes.  Hysterectomy: no.  Menopausal status: postmenopausal.  HRT use: 0 years. Colonoscopy: yes; normal. Mammogram within the last year: no. Number of breast biopsies: 0. Up to date with pelvic exams: n/a. Any excessive radiation exposure in the past: no  Past Medical History:  Diagnosis Date   Family history of melanoma    Family history of pancreatic cancer    Family history of uterine cancer    Hyperlipidemia    Hypertension    pancreatic ca 06/2023    new dx   Vitamin D deficiency     Past Surgical History:  Procedure Laterality Date   BIOPSY  07/08/2023   Procedure: BIOPSY;  Surgeon: Vida Rigger, MD;  Location: [facility];  Service: Gastroenterology;;   DUODENAL STENT PLACEMENT N/A 07/10/2023   Procedure: DUODENAL STENT PLACEMENT;  Surgeon: Vida Rigger, MD;  Location: [facility];  Service: Gastroenterology;  Laterality: N/A;   ESOPHAGOGASTRODUODENOSCOPY (EGD) WITH PROPOFOL N/A 07/08/2023   Procedure: ESOPHAGOGASTRODUODENOSCOPY (EGD) WITH PROPOFOL;  Surgeon: Vida Rigger, MD;  Location: [facility];  Service: Gastroenterology;  Laterality: N/A;   ESOPHAGOGASTRODUODENOSCOPY (EGD) WITH PROPOFOL N/A 07/10/2023   Procedure: ESOPHAGOGASTRODUODENOSCOPY (EGD) WITH PROPOFOL;  Surgeon: Vida Rigger, MD;  Location: [facility];  Service: Gastroenterology;  Laterality: N/A;    Social History   Socioeconomic History   Marital status: Married    Spouse name: Not on file   Number of children: Not on file   Years of education: 1   Highest education level: Not on file  Occupational History   Not on file  Tobacco Use   Smoking status: Never   Smokeless tobacco: Never  Substance and Sexual Activity   Alcohol use: No   Drug use: No   Sexual activity: Not on file  Other Topics Concern   Not on file  Social History Narrative   Not on file   Social Determinants of Health  Financial Resource Strain: Not on file  Food Insecurity: No Food Insecurity (07/07/2023)   Hunger Vital Sign    Worried About Running Out of Food in the Last Year: Never true    Ran Out of Food in the Last Year: Never true  Transportation Needs: No Transportation Needs (07/07/2023)   PRAPARE - Administrator, Civil Service (Medical): No    Lack of Transportation (Non-Medical): No  Physical Activity: Not on file  Stress: Not on file  Social Connections: Not on file     FAMILY HISTORY:  We obtained a detailed, 4-generation family history.  Significant  diagnoses are listed below: Family History  Problem Relation Age of Onset   Uterine cancer Mother        dx in her 42s   Pancreatic cancer Father    Cerebral aneurysm Sister    Melanoma Sister 21 - 62       on her leg   Sarcoidosis Sister 45   Lung cancer Brother        smoker   Pancreatic cancer Niece 93 - 78       paternal half brother's daughter   Lung cancer Niece 42 - 3       smoker     The patient has one daughter who is cancer free.  She has four full sisters and two brothers and two paternal half brothers and one sister.  A full brother died of lung cancer, a sister drown and her daughter died of lung cancer in her 28's.  A sister had melanoma in her 60's.  A paternal half brother had a daughter who had pancreatic cancer.  Both parents are deceased.  The patient's mother had uterine cancer in her 54's.  She had multiple siblings, the patient is not aware of any other family members on the maternal side with cancer.  The patient's father had pancreatic cancer.  He had two sisters and a brother who were cancer free.  There is no other reports of cancer on the paternal side.  Ms. Wirtanen is unaware of previous family history of genetic testing for hereditary cancer risks. There is no reported Ashkenazi Jewish ancestry. There is no known consanguinity.  GENETIC COUNSELING ASSESSMENT: [name] is a 89 y.o. female with a personal and family history of cancer which is somewhat suggestive of a hereditary cancer syndrome and predisposition to cancer given the number of pancreatic cancer cases in the family and the combination of pancreatic cancer and melanoma. We, therefore, discussed and recommended the following at today's visit.   DISCUSSION: We discussed that, in general, most cancer is not inherited in families, but instead is sporadic or familial. Sporadic cancers occur by chance and typically happen at older ages (>50 years) as this type of cancer is caused by genetic changes  acquired during an individual's lifetime. Some families have more cancers than would be expected by chance; however, the ages or types of cancer are not consistent with a known genetic mutation or known genetic mutations have been ruled out. This type of familial cancer is thought to be due to a combination of multiple genetic, environmental, hormonal, and lifestyle factors. While this combination of factors likely increases the risk of cancer, the exact source of this risk is not currently identifiable or testable.  We discussed that 12 - 13% of cancer is hereditary, with most cases of pancreatic cancer associated with BRCA mutations.  There are other genes that can be associated with  hereditary pancreatic cancer syndromes.  These include CDKN2A and Lynch syndrome.  Based on her sister's diagnosis of melanoma and the personal and family history of pancreatic cancer, Ms. Mensing is at risk for a CDKN2A mutation as well.  We reviewed the different hereditary pancreatic cancer syndromes, including BRCA (there is no breast or ovarian cancer in the family that the patient is aware of) and Lynch syndrome based on her mother's diagnosis of uterine cancer.  We discussed that testing is beneficial for several reasons including knowing how to follow individuals after completing their treatment, identifying whether potential treatment options such as PARP inhibitors would be beneficial, and understand if other family members could be at risk for cancer and allow them to undergo genetic testing.   We reviewed the characteristics, features and inheritance patterns of hereditary cancer syndromes. We also discussed genetic testing, including the appropriate family members to test, the process of testing, insurance coverage and turn-around-time for results. We discussed the implications of a negative, positive, carrier and/or variant of uncertain significant result. Ms. Doddridge  was offered a common hereditary cancer panel (47  genes) and an expanded pan-cancer panel (77 genes). Ms. Fallin was informed of the benefits and limitations of each panel, including that expanded pan-cancer panels contain genes that do not have clear management guidelines at this point in time.  We also discussed that as the number of genes included on a panel increases, the chances of variants of uncertain significance increases. Ms. Perrier decided to pursue genetic testing for the CancerNext-Expanded+RNAinsight gene panel.   The CancerNext-Expanded gene panel offered by [facility] and includes sequencing and rearrangement analysis for the following 77 genes: AIP, ALK, APC*, ATM*, AXIN2, BAP1, BARD1, BMPR1A, BRCA1*, BRCA2*, BRIP1*, CDC73, CDH1*, CDK4, CDKN1B, CDKN2A, CHEK2*, CTNNA1, DICER1, FH, FLCN, KIF1B, LZTR1, MAX, MEN1, MET, MLH1*, MSH2*, MSH3, MSH6*, MUTYH*, NF1*, NF2, NTHL1, PALB2*, PHOX2B, PMS2*, POT1, PRKAR1A, PTCH1, PTEN*, RAD51C*, RAD51D*, RB1, RET, SDHA, SDHAF2, SDHB, SDHC, SDHD, SMAD4, SMARCA4, SMARCB1, SMARCE1, STK11, SUFU, TMEM127, TP53*, TSC1, TSC2, and VHL (sequencing and deletion/duplication); EGFR, EGLN1, HOXB13, KIT, MITF, PDGFRA, POLD1, and POLE (sequencing only); EPCAM and GREM1 (deletion/duplication only). DNA and RNA analyses performed for * genes.   Based on Ms. Radick's personal and family history of cancer, she meets medical criteria for genetic testing. Despite that she meets criteria, she may still have an out of pocket cost. We discussed that if her out of pocket cost for testing is over $100, the laboratory will call and confirm whether she wants to proceed with testing.  If the out of pocket cost of testing is less than $100 she will be billed by the genetic testing laboratory.   We discussed that some people do not want to undergo genetic testing due to fear of genetic discrimination.  The Genetic Information Nondiscrimination Act (GINA) was signed into federal law in 2008. GINA prohibits health insurers and most  employers from discriminating against individuals based on genetic information (including the results of genetic tests and family history information). According to GINA, health insurance companies cannot consider genetic information to be a preexisting condition, nor can they use it to make decisions regarding coverage or rates. GINA also makes it illegal for most employers to use genetic information in making decisions about hiring, firing, promotion, or terms of employment. It is important to note that GINA does not offer protections for life insurance, disability insurance, or long-term care insurance. GINA does not apply to those in the [employer], those who work for  companies with less than 15 employees, and new life insurance or long-term disability Medical illustrator.  Health status due to a cancer diagnosis is not protected under GINA. More information about GINA can be found by visiting [employer].   We discussed that if testing came back negative, due to the number of cases of pancreatic cancer in the family, family members could consider high risk pancreatic cancer screening.  We will discuss further once results are back.    PLAN: After considering the risks, benefits, and limitations, Ms. Bolus provided informed consent to pursue genetic testing.  Blood was drawn at [facility] on July 24, 2023 and was sent to [facility] for analysis of the CancerNext-Expanded+RNAinsight. Results should be available within approximately 2-3 weeks' time, at which point they will be disclosed by telephone to Ms. Kilmer, as will any additional recommendations warranted by these results. Ms. Rahn will receive a summary of her genetic counseling visit and a copy of her results once available. This information will also be available in Epic.   Lastly, we encouraged Ms. Maffucci to remain in contact with cancer genetics annually so that we can continuously update the family history  and inform her of any changes in cancer genetics and testing that may be of benefit for this family.   Ms. Collum questions were answered to her satisfaction today. Our contact information was provided should additional questions or concerns arise. Thank you for the referral and allowing [place] to share in the care of your patient.   Jazel Nimmons P. Lowell Guitar, MS, [facility] Licensed, Patent attorney [email or web address] phone: [phone]  The patient was seen for a total of 40 minutes in face-to-face genetic counseling.  The patient brought her daughter. Drs. Meliton Rattan, and/or [place] were available for questions, if needed..    _______________________________________________________________________ For Office Staff:  Number of people involved in session: 2 Was an Intern/ student involved with case: no

## 2023-08-03 ENCOUNTER — Other Ambulatory Visit: Payer: Self-pay | Admitting: Oncology

## 2023-08-04 ENCOUNTER — Ambulatory Visit (HOSPITAL_COMMUNITY): Payer: Medicare Other

## 2023-08-04 ENCOUNTER — Encounter (HOSPITAL_COMMUNITY)
Admission: RE | Disposition: A | Discharge status: Home / Self Care | Payer: Self-pay | Source: Ambulatory Visit | Attending: Surgery

## 2023-08-04 ENCOUNTER — Ambulatory Visit (HOSPITAL_COMMUNITY): Payer: Medicare Other | Admitting: Physician Assistant

## 2023-08-04 ENCOUNTER — Ambulatory Visit (HOSPITAL_BASED_OUTPATIENT_CLINIC_OR_DEPARTMENT_OTHER): Payer: Medicare Other | Admitting: Registered Nurse

## 2023-08-04 ENCOUNTER — Encounter (HOSPITAL_COMMUNITY): Payer: Self-pay | Admitting: Surgery

## 2023-08-04 ENCOUNTER — Other Ambulatory Visit: Payer: Self-pay

## 2023-08-04 ENCOUNTER — Ambulatory Visit (HOSPITAL_COMMUNITY)
Admission: RE | Admit: 2023-08-04 | Discharge: 2023-08-04 | Disposition: A | Discharge status: Home / Self Care | Payer: Medicare Other | Source: Ambulatory Visit | Attending: Surgery | Admitting: Surgery

## 2023-08-04 DIAGNOSIS — I083 Combined rheumatic disorders of mitral, aortic and tricuspid valves: Secondary | ICD-10-CM | POA: Diagnosis not present

## 2023-08-04 DIAGNOSIS — I10 Essential (primary) hypertension: Secondary | ICD-10-CM | POA: Insufficient documentation

## 2023-08-04 DIAGNOSIS — Z801 Family history of malignant neoplasm of trachea, bronchus and lung: Secondary | ICD-10-CM | POA: Diagnosis not present

## 2023-08-04 DIAGNOSIS — E785 Hyperlipidemia, unspecified: Secondary | ICD-10-CM | POA: Insufficient documentation

## 2023-08-04 DIAGNOSIS — Z800 Family history of malignant neoplasm of digestive organs: Secondary | ICD-10-CM | POA: Insufficient documentation

## 2023-08-04 DIAGNOSIS — I517 Cardiomegaly: Secondary | ICD-10-CM | POA: Diagnosis not present

## 2023-08-04 DIAGNOSIS — C259 Malignant neoplasm of pancreas, unspecified: Secondary | ICD-10-CM

## 2023-08-04 DIAGNOSIS — C250 Malignant neoplasm of head of pancreas: Secondary | ICD-10-CM | POA: Diagnosis not present

## 2023-08-04 DIAGNOSIS — I4891 Unspecified atrial fibrillation: Secondary | ICD-10-CM | POA: Diagnosis not present

## 2023-08-04 HISTORY — PX: PORTACATH PLACEMENT: SHX2246

## 2023-08-04 SURGERY — INSERTION, TUNNELED CENTRAL VENOUS DEVICE, WITH PORT
Anesthesia: General | Site: Chest

## 2023-08-04 MED ORDER — OXYCODONE HCL 5 MG PO TABS: 5.0000 mg | ORAL_TABLET | Freq: Once | ORAL | Status: DC | PRN

## 2023-08-04 MED ORDER — HEPARIN 6000 UNIT IRRIGATION SOLUTION
Freq: Once | Status: AC
  Administered 2023-08-04: 1
  Filled 2023-08-04: qty 6000

## 2023-08-04 MED ORDER — ONDANSETRON HCL 4 MG/2ML IJ SOLN
INTRAMUSCULAR | Status: DC | PRN
  Administered 2023-08-04: 4 mg via INTRAVENOUS

## 2023-08-04 MED ORDER — OXYCODONE HCL 5 MG/5ML PO SOLN: 5.0000 mg | Freq: Once | ORAL | Status: DC | PRN

## 2023-08-04 MED ORDER — CHLORHEXIDINE GLUCONATE 0.12 % MT SOLN
15.0000 mL | Freq: Once | OROMUCOSAL | Status: AC
  Administered 2023-08-04: 15 mL via OROMUCOSAL

## 2023-08-04 MED ORDER — BUPIVACAINE-EPINEPHRINE 0.25% -1:200000 IJ SOLN
INTRAMUSCULAR | Status: AC
  Filled 2023-08-04: qty 1

## 2023-08-04 MED ORDER — LIDOCAINE HCL (PF) 2 % IJ SOLN
INTRAMUSCULAR | Status: AC
  Filled 2023-08-04: qty 5

## 2023-08-04 MED ORDER — HEPARIN SOD (PORK) LOCK FLUSH 100 UNIT/ML IV SOLN
INTRAVENOUS | Status: AC
  Filled 2023-08-04: qty 5

## 2023-08-04 MED ORDER — CEFAZOLIN SODIUM-DEXTROSE 2-4 GM/100ML-% IV SOLN
2.0000 g | INTRAVENOUS | Status: AC
  Administered 2023-08-04: 2 g via INTRAVENOUS
  Filled 2023-08-04: qty 100

## 2023-08-04 MED ORDER — HYDROMORPHONE HCL 1 MG/ML IJ SOLN: 0.2500 mg | INTRAMUSCULAR | Status: DC | PRN

## 2023-08-04 MED ORDER — FENTANYL CITRATE (PF) 100 MCG/2ML IJ SOLN
INTRAMUSCULAR | Status: DC | PRN
  Administered 2023-08-04: 25 ug via INTRAVENOUS

## 2023-08-04 MED ORDER — ORAL CARE MOUTH RINSE: 15.0000 mL | Freq: Once | OROMUCOSAL | Status: AC

## 2023-08-04 MED ORDER — PROMETHAZINE HCL 25 MG/ML IJ SOLN: 6.2500 mg | INTRAMUSCULAR | Status: DC | PRN

## 2023-08-04 MED ORDER — DEXAMETHASONE SODIUM PHOSPHATE 10 MG/ML IJ SOLN
INTRAMUSCULAR | Status: AC
  Filled 2023-08-04: qty 1

## 2023-08-04 MED ORDER — DEXAMETHASONE SODIUM PHOSPHATE 10 MG/ML IJ SOLN
INTRAMUSCULAR | Status: DC | PRN
  Administered 2023-08-04: 8 mg via INTRAVENOUS

## 2023-08-04 MED ORDER — PROPOFOL 10 MG/ML IV BOLUS
INTRAVENOUS | Status: DC | PRN
  Administered 2023-08-04: 150 mg via INTRAVENOUS

## 2023-08-04 MED ORDER — PROPOFOL 10 MG/ML IV BOLUS
INTRAVENOUS | Status: AC
  Filled 2023-08-04: qty 20

## 2023-08-04 MED ORDER — ONDANSETRON HCL 4 MG/2ML IJ SOLN
INTRAMUSCULAR | Status: AC
  Filled 2023-08-04: qty 2

## 2023-08-04 MED ORDER — 0.9 % SODIUM CHLORIDE (POUR BTL) OPTIME
TOPICAL | Status: DC | PRN
  Administered 2023-08-04: 1000 mL

## 2023-08-04 MED ORDER — LACTATED RINGERS IV SOLN: INTRAVENOUS | Status: DC

## 2023-08-04 MED ORDER — FENTANYL CITRATE (PF) 100 MCG/2ML IJ SOLN
INTRAMUSCULAR | Status: AC
  Filled 2023-08-04: qty 2

## 2023-08-04 MED ORDER — LIDOCAINE HCL (PF) 1 % IJ SOLN
INTRAMUSCULAR | Status: AC
  Filled 2023-08-04: qty 30

## 2023-08-04 MED ORDER — LIDOCAINE 2% (20 MG/ML) 5 ML SYRINGE
INTRAMUSCULAR | Status: DC | PRN
  Administered 2023-08-04: 60 mg via INTRAVENOUS

## 2023-08-04 MED ORDER — HEPARIN SOD (PORK) LOCK FLUSH 100 UNIT/ML IV SOLN
INTRAVENOUS | Status: DC | PRN
  Administered 2023-08-04: 500 [IU] via INTRAVENOUS

## 2023-08-04 SURGICAL SUPPLY — 43 items
ADH SKN CLS APL DERMABOND .7 (GAUZE/BANDAGES/DRESSINGS)
APL PRP STRL LF DISP 70% ISPRP (MISCELLANEOUS) ×1
APL SKNCLS STERI-STRIP NONHPOA (GAUZE/BANDAGES/DRESSINGS)
BAG DECANTER FOR FLEXI CONT (MISCELLANEOUS) ×1 IMPLANT
BENZOIN TINCTURE PRP APPL 2/3 (GAUZE/BANDAGES/DRESSINGS) IMPLANT
BLADE SURG 15 STRL LF DISP TIS (BLADE) ×1 IMPLANT
BLADE SURG 15 STRL SS (BLADE) ×1
BLADE SURG SZ11 CARB STEEL (BLADE) ×1 IMPLANT
CHLORAPREP W/TINT 26 (MISCELLANEOUS) ×1 IMPLANT
CLSR STERI-STRIP ANTIMIC 1/2X4 (GAUZE/BANDAGES/DRESSINGS) IMPLANT
COVER SURGICAL LIGHT HANDLE (MISCELLANEOUS) ×1 IMPLANT
DERMABOND ADVANCED .7 DNX12 (GAUZE/BANDAGES/DRESSINGS) IMPLANT
DRAPE C-ARM 42X120 X-RAY (DRAPES) ×1 IMPLANT
DRAPE LAPAROSCOPIC ABDOMINAL (DRAPES) ×1 IMPLANT
DRAPE UTILITY XL STRL (DRAPES) ×1 IMPLANT
DRSG TEGADERM 4X4.75 (GAUZE/BANDAGES/DRESSINGS) IMPLANT
ELECT REM PT RETURN 15FT ADLT (MISCELLANEOUS) ×1 IMPLANT
GAUZE 4X4 16PLY ~~LOC~~+RFID DBL (SPONGE) ×1 IMPLANT
GAUZE SPONGE 2X2 8PLY STRL LF (GAUZE/BANDAGES/DRESSINGS) IMPLANT
GAUZE SPONGE 4X4 12PLY STRL (GAUZE/BANDAGES/DRESSINGS) ×1 IMPLANT
GLOVE BIOGEL PI IND STRL 6 (GLOVE) ×1 IMPLANT
GLOVE BIOGEL PI MICRO STRL 5.5 (GLOVE) ×1 IMPLANT
GOWN STRL REUS W/ TWL LRG LVL3 (GOWN DISPOSABLE) ×1 IMPLANT
GOWN STRL REUS W/TWL LRG LVL3 (GOWN DISPOSABLE) ×1
KIT BASIN OR (CUSTOM PROCEDURE TRAY) ×1 IMPLANT
KIT PORT POWER 8FR ISP CVUE (Port) ×1 IMPLANT
KIT TURNOVER KIT A (KITS) IMPLANT
NDL HYPO 22X1.5 SAFETY MO (MISCELLANEOUS) ×1 IMPLANT
NEEDLE HYPO 22X1.5 SAFETY MO (MISCELLANEOUS) ×1 IMPLANT
PACK BASIC VI WITH GOWN DISP (CUSTOM PROCEDURE TRAY) ×1 IMPLANT
PENCIL SMOKE EVACUATOR (MISCELLANEOUS) ×1 IMPLANT
SPIKE FLUID TRANSFER (MISCELLANEOUS) ×1 IMPLANT
SUT MNCRL AB 4-0 PS2 18 (SUTURE) ×1 IMPLANT
SUT PROLENE 2 0 SH DA (SUTURE) ×2 IMPLANT
SUT VIC AB 2-0 SH 18 (SUTURE) IMPLANT
SUT VIC AB 2-0 SH 27 (SUTURE)
SUT VIC AB 2-0 SH 27X BRD (SUTURE) IMPLANT
SUT VIC AB 3-0 SH 27 (SUTURE) ×1
SUT VIC AB 3-0 SH 27X BRD (SUTURE) ×1 IMPLANT
SYR 20ML ECCENTRIC (SYRINGE) ×1 IMPLANT
SYR 20ML LL LF (SYRINGE) ×1 IMPLANT
TOWEL OR 17X26 10 PK STRL BLUE (TOWEL DISPOSABLE) ×1 IMPLANT
TOWEL OR NON WOVEN STRL DISP B (DISPOSABLE) ×1 IMPLANT

## 2023-08-04 NOTE — Discharge Instructions (Signed)
SURGERY DISCHARGE INSTRUCTIONS: PORT-A-CATH PLACEMENT  Activity You may resume your usual activities as tolerated Ok to shower in 48 hours after your dressing has been removed, but do not bathe or submerge incisions underwater for 2 weeks. Do not drive while taking narcotic pain medication.  Wound Care Your port has been left accessed to be used for chemotherapy tomorrow. Do NOT get the dressing wet or remove it. Leave everything in place until your treatment. Your dressing will be removed after your first infusion. The small white strips over your incision will fall off on their own. You may remove them after 1 week if they are still in place. You may shower and allow warm soapy water to run over your incision in 48 hours. Gently pat dry. Do not submerge your incision underwater for 2 weeks. Monitor your incision for any new redness, tenderness, or drainage.  When to Call us: Fever greater than 100.5 New redness, drainage, or swelling at incision site Severe pain, nausea, or vomiting Shortness of breath, difficulty breathing  For questions or concerns, please call the [facility] office at [phone].

## 2023-08-04 NOTE — Transfer of Care (Signed)
Immediate Anesthesia Transfer of Care Note  Patient: Wendy [name]  Procedure(s) Performed: INSERTION PORT-A-CATH (Chest)  Patient Location: PACU  Anesthesia Type:General  Level of Consciousness: awake, alert , oriented, and patient cooperative  Airway & Oxygen Therapy: Patient Spontanous Breathing and Patient connected to face mask oxygen  Post-op Assessment: Report given to RN, Post -op Vital signs reviewed and stable, and Patient moving all extremities  Post vital signs: Reviewed and stable  Last Vitals:  Vitals Value Taken Time  BP 159/66 08/04/23 0953  Temp    Pulse 46 08/04/23 0956  Resp 14 08/04/23 0956  SpO2 100 % 08/04/23 0956  Vitals shown include unfiled device data.  Last Pain:  Vitals:   08/04/23 0744  TempSrc: Oral  PainSc: 0-No pain         Complications: No notable events documented.

## 2023-08-04 NOTE — Op Note (Signed)
Date: 08/04/23  Patient: Wendy [name] MRN: [id number]  Preoperative Diagnosis: Pancreatic adenocarcinoma Postoperative Diagnosis: Same  Procedure: Port-a-cath insertion  Surgeon: Sophronia Simas, MD  EBL: Minimal  Anesthesia: General LMA  Specimens: None  Indications: Wendy [name] is an 89 yo female who was recently diagnosed with pancreatic adenocarcinoma. She is to begin chemotherapy tomorrow and presents today for port placement. After a discussion of the risks and benefits of surgery, she agreed to proceed.  Findings: 8-Fr single-lumen power port placed via the right subclavian vein under fluoroscopic guidance. Total catheter length of 16.5cm.  Procedure details: Informed consent was obtained in the preoperative area prior to the procedure. The patient was brought to the operating room and placed on the table in the supine position. General anesthesia was induced and appropriate lines and drains were placed for intraoperative monitoring. Perioperative antibiotics were administered per SCIP guidelines. The chest and neck were prepped and draped in the usual sterile fashion. A pre-procedure timeout was taken verifying patient identity, surgical site and procedure to be performed.  The patient was placed in Trendelenberg and the right subclavian vein was accessed with a large-bore needle. A guidewire was inserted and advanced, and position in the SVC was confirmed fluoroscopically. The needle was removed and the wire was clipped to the drapes to secure its position. A small skin incision was made in the right upper chest wall, incorporating the wire exit site, and a subcutaneous pocket was created with cautery. The port and catheter were then flushed and brought onto the field. Three 2-0 prolene sutures were used to secure the port in the subcutaneous pocket to the pectoralis fascia, but the sutures were not tied down. The port was placed in the pocket and the attached catheter was then  measured using fluoro - it was placed over the skin adjacent to the guidewire, and marked externally at the cavoatrial junction. The catheter was then cut at this location, which was at 16.5cm. The dilator and sheath were then advanced over the guidewire under fluoroscopic guidance, and the wire and dilator were removed. The end of the catheter was inserted through the sheath and advanced, and the sheath was peeled away. The port was then accessed with a Huber needle, and blood was aspirated and the port was flushed with heparinized saline. A final fluoroscopic image confirmed appropriate position of the catheter tip within the SVC, without kinking of the catheter. The prolene sutures were tied down. The skin was closed with a deep dermal layer of interrupted 3-0 Vicryl suture, followed by a running subcuticular 4-0 monocryl suture. Steristrips were applied over the incision. The port was then accessed with a butterfly Huber needle and a final flush of 500 units heparin (100 units/mL) was given via the port. A sterile gauze dressing and Tegaderm were applied.  The patient tolerated the procedure well with no apparent complications. All counts were correct x2 at the end of the procedure. The patient was extubated and taken to PACU in stable condition.  Sophronia Simas, MD 08/04/23 9:49 AM

## 2023-08-04 NOTE — Anesthesia Postprocedure Evaluation (Signed)
Anesthesia Post Note  Patient: Wendy [name]  Procedure(s) Performed: INSERTION PORT-A-CATH (Chest)     Patient location during evaluation: PACU Anesthesia Type: General Level of consciousness: awake and alert Pain management: pain level controlled Vital Signs Assessment: post-procedure vital signs reviewed and stable Respiratory status: spontaneous breathing, nonlabored ventilation and respiratory function stable Cardiovascular status: blood pressure returned to baseline and stable Postop Assessment: no apparent nausea or vomiting Anesthetic complications: no   No notable events documented.  Last Vitals:  Vitals:   08/04/23 1030 08/04/23 1100  BP: (!) 151/72 (!) 158/68  Pulse: 64 (!) 55  Resp: 15 16  Temp: (!) 36.1 C   SpO2: 97% 100%    Last Pain:  Vitals:   08/04/23 1100  TempSrc:   PainSc: 0-No pain                 Lowella Curb

## 2023-08-04 NOTE — Anesthesia Procedure Notes (Signed)
Procedure Name: LMA Insertion Date/Time: 08/04/2023 9:07 AM  Performed by: Elisabeth Cara, CRNAPre-anesthesia Checklist: Patient identified, Emergency Drugs available, Suction available, Patient being monitored and Timeout performed Patient Re-evaluated:Patient Re-evaluated prior to induction Oxygen Delivery Method: Circle system utilized Preoxygenation: Pre-oxygenation with 100% oxygen Induction Type: IV induction LMA: LMA with gastric port inserted LMA Size: 4.0 Number of attempts: 1 Placement Confirmation: positive ETCO2 and breath sounds checked- equal and bilateral Tube secured with: Tape Dental Injury: Teeth and Oropharynx as per pre-operative assessment

## 2023-08-04 NOTE — Anesthesia Preprocedure Evaluation (Addendum)
Anesthesia Evaluation  Patient identified by MRN, date of birth, ID band Patient awake    Reviewed: Allergy & Precautions, NPO status , Patient's Chart, lab work & pertinent test results  History of Anesthesia Complications Negative for: history of anesthetic complications  Airway Mallampati: II  TM Distance: >3 FB Neck ROM: Full    Dental  (+) Dental Advisory Given   Pulmonary neg pulmonary ROS   Pulmonary exam normal        Cardiovascular hypertension, Pt. on medications (-) angina (-) Past MI, (-) Cardiac Stents and (-) CABG + dysrhythmias (PACs) Atrial Fibrillation + Valvular Problems/Murmurs (mild-to-moderate MR, moderate TR)  Rhythm:Irregular  HLD  TTE 10/14/2019: IMPRESSIONS     1. Left ventricular ejection fraction, by visual estimation, is 55 to  60%. The left ventricle has normal function. There is no left ventricular  hypertrophy.   2. Global right ventricle has normal systolic function.The right  ventricular size is normal. No increase in right ventricular wall  thickness.   3. Left atrial size was mildly dilated.   4. Right atrial size was mildly dilated.   5. Moderate calcification of the mitral valve leaflet(s).   6. Moderate mitral annular calcification.   7. Moderate thickening of the mitral valve leaflet(s).   8. The mitral valve is degenerative. Mild to moderate mitral valve  regurgitation.   9. MR not well interrogated Restricted posterior leaflet motion with  posteriorly directed jet can consider TEE to further evaluate.  10. The tricuspid valve is normal in structure. Tricuspid valve  regurgitation moderate.  11. The aortic valve is tricuspid Aortic valve regurgitation is mild to  moderate by color flow Doppler.  12. There is Moderate calcification of the aortic valve.  13. There is Moderate thickening of the aortic valve.  14. The pulmonic valve was grossly normal. Pulmonic valve regurgitation is   mild by color flow Doppler.  15. Aortic dilatation noted.  16. There is mild dilatation of the aortic root measuring 38 mm.  17. Moderately elevated pulmonary artery systolic pressure.     Neuro/Psych negative neurological ROS     GI/Hepatic Neg liver ROS,neg GERD  ,,Pancreatic mass   Endo/Other  negative endocrine ROS    Renal/GU negative Renal ROS     Musculoskeletal  (+) Arthritis , Osteoarthritis,    Abdominal   Peds  Hematology  (+) Blood dyscrasia (Hgb 7.6), anemia   Anesthesia Other Findings All: Bactrim  Reproductive/Obstetrics                             Anesthesia Physical Anesthesia Plan  ASA: 3  Anesthesia Plan: General   Post-op Pain Management: Minimal or no pain anticipated   Induction: Intravenous  PONV Risk Score and Plan: 2 and Treatment may vary due to age or medical condition, Ondansetron, Dexamethasone and Midazolam  Airway Management Planned: LMA  Additional Equipment: None  Intra-op Plan:   Post-operative Plan: Extubation in OR  Informed Consent: I have reviewed the patients History and Physical, chart, labs and discussed the procedure including the risks, benefits and alternatives for the proposed anesthesia with the patient or authorized representative who has indicated his/her understanding and acceptance.     Dental advisory given  Plan Discussed with: CRNA and Anesthesiologist  Anesthesia Plan Comments:         Anesthesia Quick Evaluation

## 2023-08-04 NOTE — H&P (Signed)
Wendy [name] is an 89 y.o. female.    HPI: Wendy [name] is an 89 yo female with pancreatic adenocarcinoma who presents today for port placement. She is to begin neoadjuvant chemotherapy tomorrow (gemcitabine/abraxane).    Past Medical History:  Diagnosis Date   Arthritis    Family history of melanoma    Family history of pancreatic cancer    Family history of uterine cancer    Hyperlipidemia    Hypertension    pancreatic ca 06/2023   new dx   Vitamin D deficiency     Past Surgical History:  Procedure Laterality Date   BIOPSY  07/08/2023   Procedure: BIOPSY;  Surgeon: Vida Rigger, MD;  Location: [facility];  Service: Gastroenterology;;   CATARACT EXTRACTION Bilateral    DUODENAL STENT PLACEMENT N/A 07/10/2023   Procedure: DUODENAL STENT PLACEMENT;  Surgeon: Vida Rigger, MD;  Location: [facility];  Service: Gastroenterology;  Laterality: N/A;   ESOPHAGOGASTRODUODENOSCOPY (EGD) WITH PROPOFOL N/A 07/08/2023   Procedure: ESOPHAGOGASTRODUODENOSCOPY (EGD) WITH PROPOFOL;  Surgeon: Vida Rigger, MD;  Location: [facility];  Service: Gastroenterology;  Laterality: N/A;   ESOPHAGOGASTRODUODENOSCOPY (EGD) WITH PROPOFOL N/A 07/10/2023   Procedure: ESOPHAGOGASTRODUODENOSCOPY (EGD) WITH PROPOFOL;  Surgeon: Vida Rigger, MD;  Location: [facility];  Service: Gastroenterology;  Laterality: N/A;    Family History  Problem Relation Age of Onset   Uterine cancer Mother        dx in her 62s   Pancreatic cancer Father    Cerebral aneurysm Sister    Melanoma Sister 48 - 72       on her leg   Sarcoidosis Sister 68   Lung cancer Brother        smoker   Pancreatic cancer Niece 20 - 71       paternal half brother's daughter   Lung cancer Niece 83 - 4       smoker   Social History:  reports that she has never smoked. She has never used smokeless tobacco. She reports that she does not drink alcohol and does not use drugs.  Allergies:  Allergies  Allergen Reactions   Bactrim  [Sulfamethoxazole-Trimethoprim] Nausea And Vomiting and Other (See Comments)    "Knocked her for a loop"- dizziness and weakness, also   Protonix [Pantoprazole] Rash    Medications Prior to Admission  Medication Sig Dispense Refill   acetaminophen (TYLENOL) 500 MG tablet Take 500-1,000 mg by mouth every 6 (six) hours as needed for moderate pain (ususally at night).     benazepril (LOTENSIN) 20 MG tablet Take 20 mg by mouth daily.     cyanocobalamin (VITAMIN B12) 1000 MCG tablet Take 1,000 mcg by mouth daily.     docusate sodium (COLACE) 100 MG capsule Take 1 capsule (100 mg total) by mouth 2 (two) times daily. (Patient taking differently: Take 100 mg by mouth 2 (two) times daily as needed for moderate constipation.) 10 capsule 0   famotidine (PEPCID) 40 MG tablet Take 40 mg by mouth daily.     ferrous sulfate 325 (65 FE) MG EC tablet Take 1 tablet (325 mg total) by mouth daily with breakfast. 30 tablet 1   lidocaine-prilocaine (EMLA) cream Apply 1 Application topically as needed (apply to Port 1-2 hours prior to appointment). 30 g 0   Multiple Vitamin (MULTIVITAMIN WITH MINERALS) TABS tablet Take 1 tablet by mouth daily. 30 tablet 0   polyethylene glycol (MIRALAX / GLYCOLAX) 17 g packet Take 17 g by mouth daily as needed for mild  constipation. 14 each 0   simvastatin (ZOCOR) 40 MG tablet Take 20 mg by mouth daily with supper.     ondansetron (ZOFRAN) 4 MG tablet Take 1 tablet (4 mg total) by mouth every 6 (six) hours as needed for nausea. (Patient not taking: Reported on 07/24/2023) 20 tablet 0   prochlorperazine (COMPAZINE) 5 MG tablet Take 1 tablet (5 mg total) by mouth every 6 (six) hours as needed for nausea or vomiting. 30 tablet 2    No results found for this or any previous visit (from the past 48 hour(s)). No results found.  Review of Systems  Blood pressure (!) 151/70, pulse 70, temperature 97.8 F (36.6 C), temperature source Oral, resp. rate 16, height 5\' 6"  (1.676 m), weight 54  kg, SpO2 100%. Physical Exam Vitals reviewed.  Constitutional:      General: She is not in acute distress.    Appearance: Normal appearance.  HENT:     Head: Normocephalic and atraumatic.  Eyes:     General: No scleral icterus.    Conjunctiva/sclera: Conjunctivae normal.  Pulmonary:     Effort: Pulmonary effort is normal. No respiratory distress.  Musculoskeletal:        General: Normal range of motion.  Skin:    General: Skin is warm and dry.  Neurological:     General: No focal deficit present.     Mental Status: She is alert and oriented to person, place, and time.      Assessment/Plan 89 yo female with pancreatic adenocarcinoma. Proceed to OR for portacath insertion. Risks and benefits were reviewed, including the risk of pneumothorax. She expressed understanding and agrees to proceed. Port will be left access for chemotherapy tomorrow. Plan for CXR in PACU and discharge home.  [name], MD 08/04/2023, 8:38 AM

## 2023-08-05 ENCOUNTER — Inpatient Hospital Stay: Payer: Medicare Other

## 2023-08-05 ENCOUNTER — Encounter (HOSPITAL_COMMUNITY): Payer: Self-pay | Admitting: Surgery

## 2023-08-05 ENCOUNTER — Inpatient Hospital Stay: Payer: Medicare Other | Admitting: Licensed Clinical Social Worker

## 2023-08-05 ENCOUNTER — Other Ambulatory Visit: Payer: Medicare Other

## 2023-08-05 VITALS — BP 166/73 | HR 76 | Temp 98.2°F | Resp 18 | Ht 66.0 in | Wt 120.2 lb

## 2023-08-05 DIAGNOSIS — C250 Malignant neoplasm of head of pancreas: Secondary | ICD-10-CM

## 2023-08-05 DIAGNOSIS — Z5111 Encounter for antineoplastic chemotherapy: Secondary | ICD-10-CM | POA: Diagnosis not present

## 2023-08-05 DIAGNOSIS — Z01818 Encounter for other preprocedural examination: Secondary | ICD-10-CM | POA: Diagnosis not present

## 2023-08-05 LAB — CMP (CANCER CENTER ONLY)
ALT: 14 U/L (ref 0–44)
AST: 19 U/L (ref 15–41)
Albumin: 4.2 g/dL (ref 3.5–5.0)
Alkaline Phosphatase: 62 U/L (ref 38–126)
Anion gap: 6 (ref 5–15)
BUN: 29 mg/dL — ABNORMAL HIGH (ref 8–23)
CO2: 26 mmol/L (ref 22–32)
Calcium: 10.1 mg/dL (ref 8.9–10.3)
Chloride: 106 mmol/L (ref 98–111)
Creatinine: 0.7 mg/dL (ref 0.44–1.00)
GFR, Estimated: >60 mL/min (ref >60)
Glucose, Bld: 113 mg/dL — ABNORMAL HIGH (ref 70–99)
Potassium: 4 mmol/L (ref 3.5–5.1)
Sodium: 138 mmol/L (ref 135–145)
Total Bilirubin: 0.3 mg/dL (ref 0.3–1.2)
Total Protein: 6.3 g/dL — ABNORMAL LOW (ref 6.5–8.1)

## 2023-08-05 LAB — CBC WITH DIFFERENTIAL (CANCER CENTER ONLY)
Abs Immature Granulocytes: 0.02 10*3/uL (ref 0.00–0.07)
Basophils Absolute: 0.1 10*3/uL (ref 0.0–0.1)
Basophils Relative: 1 %
Eosinophils Absolute: 0 10*3/uL (ref 0.0–0.5)
Eosinophils Relative: 0 %
HCT: 31.6 % — ABNORMAL LOW (ref 36.0–46.0)
Hemoglobin: 10.1 g/dL — ABNORMAL LOW (ref 12.0–15.0)
Immature Granulocytes: 0 %
Lymphocytes Relative: 14 %
Lymphs Abs: 1 10*3/uL (ref 0.7–4.0)
MCH: 31 pg (ref 26.0–34.0)
MCHC: 32 g/dL (ref 30.0–36.0)
MCV: 96.9 fL (ref 80.0–100.0)
Monocytes Absolute: 0.7 10*3/uL (ref 0.1–1.0)
Monocytes Relative: 10 %
Neutro Abs: 5 10*3/uL (ref 1.7–7.7)
Neutrophils Relative %: 75 %
Platelet Count: 295 10*3/uL (ref 150–400)
RBC: 3.26 MIL/uL — ABNORMAL LOW (ref 3.87–5.11)
RDW: 14.9 % (ref 11.5–15.5)
WBC Count: 6.7 10*3/uL (ref 4.0–10.5)
nRBC: 0 % (ref 0.0–0.2)

## 2023-08-05 MED ORDER — PROCHLORPERAZINE MALEATE 10 MG PO TABS
10.0000 mg | ORAL_TABLET | Freq: Once | ORAL | Status: AC
  Administered 2023-08-05: 10 mg via ORAL
  Filled 2023-08-05: qty 1

## 2023-08-05 MED ORDER — SODIUM CHLORIDE 0.9 % IV SOLN
1000.0000 mg/m2 | Freq: Once | INTRAVENOUS | Status: AC
  Administered 2023-08-05: 1634 mg via INTRAVENOUS
  Filled 2023-08-05: qty 42.97

## 2023-08-05 MED ORDER — PACLITAXEL PROTEIN-BOUND CHEMO INJECTION 100 MG
100.0000 mg/m2 | Freq: Once | INTRAVENOUS | Status: AC
  Administered 2023-08-05: 150 mg via INTRAVENOUS
  Filled 2023-08-05: qty 30

## 2023-08-05 MED ORDER — SODIUM CHLORIDE 0.9 % IV SOLN: Freq: Once | INTRAVENOUS | Status: AC

## 2023-08-05 MED ORDER — SODIUM CHLORIDE 0.9% FLUSH
10.0000 mL | INTRAVENOUS | Status: DC | PRN
  Administered 2023-08-05: 10 mL

## 2023-08-05 MED ORDER — HEPARIN SOD (PORK) LOCK FLUSH 100 UNIT/ML IV SOLN
500.0000 [IU] | Freq: Once | INTRAVENOUS | Status: AC | PRN
  Administered 2023-08-05: 500 [IU]

## 2023-08-05 NOTE — Progress Notes (Signed)
[facility] Clinical Social Work  Initial Assessment   Wendy [name] is a 89 y.o. year old female presenting alone. Clinical Social Work was referred by medical provider for assessment of psychosocial needs.   SDOH (Social Determinants of Health) assessments performed: Yes   SDOH Screenings   Food Insecurity: No Food Insecurity (07/07/2023)  Housing: Low Risk  (07/07/2023)  Transportation Needs: No Transportation Needs (07/07/2023)  Utilities: Not At Risk (07/07/2023)  Depression (PHQ2-9): Low Risk  (08/04/2023)  Tobacco Use: Low Risk  (08/04/2023)     Distress Screen completed: No     No data to display            Family/Social Information:  Housing Arrangement: patient lives with her husband, Wendy [name]. Family members/support persons in your life? Family and Friends.  Patient's daughter and son-in-law are very supportive.  Patient also has two step grandchildren who contact her regularly. Transportation concerns: no  Employment: Retired.  Patient used to work in the space system. 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 Services Currently in place:  Medicare  Coping/ Adjustment to diagnosis: Patient understands treatment plan and what happens next? yes Concerns about diagnosis and/or treatment: I'm not especially worried about anything Patient reported stressors:  Patient denied.  She stated she is keeping a very positive attitude and is remaining as independent as possible. Hopes and/or priorities: God and her family. Patient enjoys gardening Current coping skills/ strengths: Ability for insight , Active sense of humor , Average or above average intelligence , Capable of independent living , Communication skills , Financial means , General fund of knowledge , Motivation for treatment/growth , and Supportive family/friends      SUMMARY: Current SDOH Barriers:  None per patient.  Clinical Social Work Clinical Goal(s):  No clinical social work goals at this time  Interventions: Discussed common feeling and emotions when being diagnosed with cancer, and the importance of support during treatment Informed patient of the support team roles and support services at [facility] Provided CSW contact information and encouraged patient to call with any questions or concerns Provided patient with information about the [employer] and CSW contact information.   Follow Up Plan: Patient will contact CSW with any support or resource needs Patient verbalizes understanding of plan: Yes    Dorothey Baseman, LCSW Clinical Social Worker [facility]

## 2023-08-05 NOTE — Patient Instructions (Signed)
[facility]  Discharge Instructions: Thank you for choosing [facility] to provide your oncology and hematology care.   If you have a lab appointment with the Cancer Center, please go directly to the Cancer Center and check in at the registration area.   Wear comfortable clothing and clothing appropriate for easy access to any Portacath or PICC line.   We strive to give you quality time with your provider. You may need to reschedule your appointment if you arrive late (15 or more minutes).  Arriving late affects you and other patients whose appointments are after yours.  Also, if you miss three or more appointments without notifying the office, you may be dismissed from the clinic at the provider's discretion.      For prescription refill requests, have your pharmacy contact our office and allow 72 hours for refills to be completed.    Today you received the following chemotherapy and/or immunotherapy agents: abraxane, gemcitabine      To help prevent nausea and vomiting after your treatment, we encourage you to take your nausea medication as directed.  BELOW ARE SYMPTOMS THAT SHOULD BE REPORTED IMMEDIATELY: *FEVER GREATER THAN 100.4 F (38 C) OR HIGHER *CHILLS OR SWEATING *NAUSEA AND VOMITING THAT IS NOT CONTROLLED WITH YOUR NAUSEA MEDICATION *UNUSUAL SHORTNESS OF BREATH *UNUSUAL BRUISING OR BLEEDING *URINARY PROBLEMS (pain or burning when urinating, or frequent urination) *BOWEL PROBLEMS (unusual diarrhea, constipation, pain near the anus) TENDERNESS IN MOUTH AND THROAT WITH OR WITHOUT PRESENCE OF ULCERS (sore throat, sores in mouth, or a toothache) UNUSUAL RASH, SWELLING OR PAIN  UNUSUAL VAGINAL DISCHARGE OR ITCHING   Items with * indicate a potential emergency and should be followed up as soon as possible or go to the Emergency Department if any problems should occur.  Please show the CHEMOTHERAPY ALERT CARD or IMMUNOTHERAPY ALERT  CARD at check-in to the Emergency Department and triage nurse.  Should you have questions after your visit or need to cancel or reschedule your appointment, please contact [facility] [facility]  Dept: [phone]  and follow the prompts.  Office hours are 8:00 a.m. to 4:30 p.m. Monday - Friday. Please note that voicemails left after 4:00 p.m. may not be returned until the following business day.  We are closed weekends and major holidays. You have access to a nurse at all times for urgent questions. Please call the main number to the clinic Dept: [phone] and follow the prompts.   For any non-urgent questions, you may also contact your provider using MyChart. We now offer e-Visits for anyone 35 and older to request care online for non-urgent symptoms. For details visit [email or web address].   Also download the MyChart app! Go to the app store, search "MyChart", open the app, select [place], and log in with your MyChart username and password.  Paclitaxel Nanoparticle Albumin-Bound Injection What is this medication? NANOPARTICLE ALBUMIN-BOUND PACLITAXEL (Na no PAHR ti kuhl al BYOO muhn-bound PAK li TAX el) treats some types of cancer. It works by slowing down the growth of cancer cells. This medicine may be used for other purposes; ask your health care provider or pharmacist if you have questions. COMMON BRAND NAME(S): Abraxane What should I tell my care team before I take this medication? They need to know if you have any of these conditions: Liver disease Low white blood cell levels An unusual or allergic reaction to paclitaxel, albumin, other medications, foods, dyes, or preservatives If you  or your partner are pregnant or trying to get pregnant Breast-feeding How should I use this medication? This medication is injected into a vein. It is given by your care team in a hospital or clinic setting. Talk to your care team about the use of this medication in  children. Special care may be needed. Overdosage: If you think you have taken too much of this medicine contact a poison control center or emergency room at once. NOTE: This medicine is only for you. Do not share this medicine with others. What if I miss a dose? Keep appointments for follow-up doses. It is important not to miss your dose. Call your care team if you are unable to keep an appointment. What may interact with this medication? Other medications may affect the way this medication works. Talk with your care team about all of the medications you take. They may suggest changes to your treatment plan to lower the risk of side effects and to make sure your medications work as intended. This list may not describe all possible interactions. Give your health care provider a list of all the medicines, herbs, non-prescription drugs, or dietary supplements you use. Also tell them if you smoke, drink alcohol, or use illegal drugs. Some items may interact with your medicine. What should I watch for while using this medication? Your condition will be monitored carefully while you are receiving this medication. You may need blood work while taking this medication. This medication may make you feel generally unwell. This is not uncommon as chemotherapy can affect healthy cells as well as cancer cells. Report any side effects. Continue your course of treatment even though you feel ill unless your care team tells you to stop. This medication can cause serious allergic reactions. To reduce the risk, your care team may give you other medications to take before receiving this one. Be sure to follow the directions from your care team. This medication may increase your risk of getting an infection. Call your care team for advice if you get a fever, chills, sore throat, or other symptoms of a cold or flu. Do not treat yourself. Try to avoid being around people who are sick. This medication may increase your risk to  bruise or bleed. Call your care team if you notice any unusual bleeding. Be careful brushing or flossing your teeth or using a toothpick because you may get an infection or bleed more easily. If you have any dental work done, tell your dentist you are receiving this medication. Talk to your care team if you or your partner may be pregnant. Serious birth defects can occur if you take this medication during pregnancy and for 6 months after the last dose. You will need a negative pregnancy test before starting this medication. Contraception is recommended while taking this medication and for 6 months after the last dose. Your care team can help you find the option that works for you. If your partner can get pregnant, use a condom during sex while taking this medication and for 3 months after the last dose. Do not breastfeed while taking this medication and for 2 weeks after the last dose. This medication may cause infertility. Talk to your care team if you are concerned about your fertility. What side effects may I notice from receiving this medication? Side effects that you should report to your care team as soon as possible: Allergic reactions--skin rash, itching, hives, swelling of the face, lips, tongue, or throat Dry cough, shortness of  breath or trouble breathing Infection--fever, chills, cough, sore throat, wounds that don't heal, pain or trouble when passing urine, general feeling of discomfort or being unwell Low red blood cell level--unusual weakness or fatigue, dizziness, headache, trouble breathing Pain, tingling, or numbness in the hands or feet Stomach pain, unusual weakness or fatigue, nausea, vomiting, diarrhea, or fever that lasts longer than expected Unusual bruising or bleeding Side effects that usually do not require medical attention (report to your care team if they continue or are bothersome): Diarrhea Fatigue Hair loss Loss of appetite Nausea Vomiting This list may not  describe all possible side effects. Call your doctor for medical advice about side effects. You may report side effects to FDA at 1-800-FDA-1088. Where should I keep my medication? This medication is given in a hospital or clinic. It will not be stored at home. NOTE: This sheet is a summary. It may not cover all possible information. If you have questions about this medicine, talk to your doctor, pharmacist, or health care provider.  2024 Elsevier/Gold Standard (2022-05-02 00:00:00) Gemcitabine Injection What is this medication? GEMCITABINE (jem SYE ta been) treats some types of cancer. It works by slowing down the growth of cancer cells. This medicine may be used for other purposes; ask your health care provider or pharmacist if you have questions. COMMON BRAND NAME(S): Gemzar, Infugem What should I tell my care team before I take this medication? They need to know if you have any of these conditions: Blood disorders Infection Kidney disease Liver disease Lung or breathing disease, such as asthma or COPD Recent or ongoing radiation therapy An unusual or allergic reaction to gemcitabine, other medications, foods, dyes, or preservatives If you or your partner are pregnant or trying to get pregnant Breast-feeding How should I use this medication? This medication is injected into a vein. It is given by your care team in a hospital or clinic setting. Talk to your care team about the use of this medication in children. Special care may be needed. Overdosage: If you think you have taken too much of this medicine contact a poison control center or emergency room at once. NOTE: This medicine is only for you. Do not share this medicine with others. What if I miss a dose? Keep appointments for follow-up doses. It is important not to miss your dose. Call your care team if you are unable to keep an appointment. What may interact with this medication? Interactions have not been studied. This list  may not describe all possible interactions. Give your health care provider a list of all the medicines, herbs, non-prescription drugs, or dietary supplements you use. Also tell them if you smoke, drink alcohol, or use illegal drugs. Some items may interact with your medicine. What should I watch for while using this medication? Your condition will be monitored carefully while you are receiving this medication. This medication may make you feel generally unwell. This is not uncommon, as chemotherapy can affect healthy cells as well as cancer cells. Report any side effects. Continue your course of treatment even though you feel ill unless your care team tells you to stop. In some cases, you may be given additional medications to help with side effects. Follow all directions for their use. This medication may increase your risk of getting an infection. Call your care team for advice if you get a fever, chills, sore throat, or other symptoms of a cold or flu. Do not treat yourself. Try to avoid being around people who are  sick. This medication may increase your risk to bruise or bleed. Call your care team if you notice any unusual bleeding. Be careful brushing or flossing your teeth or using a toothpick because you may get an infection or bleed more easily. If you have any dental work done, tell your dentist you are receiving this medication. Avoid taking medications that contain aspirin, acetaminophen, ibuprofen, naproxen, or ketoprofen unless instructed by your care team. These medications may hide a fever. Talk to your care team if you or your partner wish to become pregnant or think you might be pregnant. This medication can cause serious birth defects if taken during pregnancy and for 6 months after the last dose. A negative pregnancy test is required before starting this medication. A reliable form of contraception is recommended while taking this medication and for 6 months after the last dose. Talk to  your care team about effective forms of contraception. Do not father a child while taking this medication and for 3 months after the last dose. Use a condom while having sex during this time period. Do not breastfeed while taking this medication and for at least 1 week after the last dose. This medication may cause infertility. Talk to your care team if you are concerned about your fertility. What side effects may I notice from receiving this medication? Side effects that you should report to your care team as soon as possible: Allergic reactions--skin rash, itching, hives, swelling of the face, lips, tongue, or throat Capillary leak syndrome--stomach or muscle pain, unusual weakness or fatigue, feeling faint or lightheaded, decrease in the amount of urine, swelling of the ankles, hands, or feet, trouble breathing Infection--fever, chills, cough, sore throat, wounds that don't heal, pain or trouble when passing urine, general feeling of discomfort or being unwell Liver injury--right upper belly pain, loss of appetite, nausea, light-colored stool, dark yellow or brown urine, yellowing skin or eyes, unusual weakness or fatigue Low red blood cell level--unusual weakness or fatigue, dizziness, headache, trouble breathing Lung injury--shortness of breath or trouble breathing, cough, spitting up blood, chest pain, fever Stomach pain, bloody diarrhea, pale skin, unusual weakness or fatigue, decrease in the amount of urine, which may be signs of hemolytic uremic syndrome Sudden and severe headache, confusion, change in vision, seizures, which may be signs of posterior reversible encephalopathy syndrome (PRES) Unusual bruising or bleeding Side effects that usually do not require medical attention (report to your care team if they continue or are bothersome): Diarrhea Drowsiness Hair loss Nausea Pain, redness, or swelling with sores inside the mouth or throat Vomiting This list may not describe all  possible side effects. Call your doctor for medical advice about side effects. You may report side effects to FDA at 1-800-FDA-1088. Where should I keep my medication? This medication is given in a hospital or clinic. It will not be stored at home. NOTE: This sheet is a summary. It may not cover all possible information. If you have questions about this medicine, talk to your doctor, pharmacist, or health care provider.  2024 Elsevier/Gold Standard (2022-04-23 00:00:00)

## 2023-08-06 ENCOUNTER — Telehealth: Payer: Self-pay

## 2023-08-06 ENCOUNTER — Encounter: Payer: Self-pay | Admitting: Genetic Counselor

## 2023-08-06 ENCOUNTER — Inpatient Hospital Stay: Payer: Medicare Other | Admitting: Nutrition

## 2023-08-06 DIAGNOSIS — Z1379 Encounter for other screening for genetic and chromosomal anomalies: Secondary | ICD-10-CM | POA: Insufficient documentation

## 2023-08-06 NOTE — Progress Notes (Signed)
Patient requested telephone visit.  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 She is followed by Dr. Truett Perna and received her first cycle of Abraxane and Gemcitabine yesterday. Diagnosed with Non-severe malnutrition during hospital admission on July 08, 2023.  PMH includes Anemia, Vitamin B 12 deficiency, HTN, and HLD.  Medications include B 12, Colace, Pepcid, Ferrous sulfate, MVI, Zofran, Mira lax, Compazine.  Labs include Glucose 113 and BUN 29.  Height: 5'6". Weight: 120 pounds 3.2 oz. Patient weighed 135 pounds in 2023.  UBW per patient 127 pounds. BMI: 19.4.  Patient denies nutrition impact symptoms after treatment yesterday. Reports UBW of 127 pounds. States wt loss started in May when she was caring for her husband. She then developed diarrhea with nausea and vomiting and was diagnosed with gastric outlet obstruction/pancreas cancer and had a stent placed. Since then, she has been able to tolerate a soft diet. She walks 4-6 miles daily and stays very active. Dietary recall reveals patient eats smaller amounts throughout the day and chooses lower fiber, healthy options. She avoids simple sugars and checks her blood sugars. States they are not usually higher than 110. She drinks 1-2 cartons of Ensure Max or equivalent daily.  Nutrition Diagnosis: Unintended wt loss related to cancer and associated treatments as evidenced by 6% wt loss from usual body weight.  Intervention: Educated to continue eating small, frequent meals/snacks with increased calories and protein throughout the day. Recommend change ONS to higher calorie options such as Ensure Plus, Ensure Complete or Boost Plus. Also try Protein bars if desired. Discussed ok to add whey protein powder if unable to eat adequate protein foods. Continue low fiber diet. Continue activity as tolerated. Email nutrition fact sheets with contact information. Questions answered.  Monitoring,  Evaluation, Goals: Patient will tolerate increased calories and protein to minimize further wt loss.  Next Visit: To be scheduled as needed. Patient and family have contact information.

## 2023-08-06 NOTE — Telephone Encounter (Signed)
Called and spoke to patient for first time chemo follow-up.  Patient reported no side effects after her treatment on 08/05/23.  Patient has been drinking fluids and protein shakes and denies any nausea.  Reminded patient of her next appointment and instructed patient to contact office if she had any questions or concerns in the future.  Patient verbalized understanding. All questions were answered during phone call.

## 2023-08-07 ENCOUNTER — Telehealth: Payer: Self-pay | Admitting: Genetic Counselor

## 2023-08-07 ENCOUNTER — Ambulatory Visit: Payer: Self-pay | Admitting: Genetic Counselor

## 2023-08-07 DIAGNOSIS — Z1379 Encounter for other screening for genetic and chromosomal anomalies: Secondary | ICD-10-CM

## 2023-08-07 NOTE — Progress Notes (Signed)
HPI:  Wendy [name] was previously seen in the [facility] due to a personal and family history of pancreatic cancer and concerns regarding a hereditary predisposition to cancer. Please refer to our prior cancer genetics clinic note for more information regarding our discussion, assessment and recommendations, at the time. Wendy [name]'s recent genetic test results were disclosed to her, as were recommendations warranted by these results. These results and recommendations are discussed in more detail below.  CANCER HISTORY:  Oncology History  Cancer of head of pancreas (HCC)  07/24/2023 Initial Diagnosis   Cancer of head of pancreas (HCC)   07/24/2023 Cancer Staging   Staging form: Exocrine Pancreas, AJCC 8th Edition - Clinical: Stage IIA (cT3, cN0, cM0) - Signed by Ladene Artist, MD on 07/24/2023 Total positive nodes: 0   08/05/2023 -  Chemotherapy   Patient is on Treatment Plan : PANCREATIC Abraxane D1,8,15 + Gemcitabine D1,8,15 q28d     08/06/2023 Genetic Testing   Negative genetic testing on the CancerNext-Expanded+RNAinsight panel.  The report date is August 06, 2023.  The CancerNext-Expanded gene panel offered by [facility] and includes sequencing and rearrangement analysis for the following 77 genes: AIP, ALK, APC*, ATM*, AXIN2, BAP1, BARD1, BMPR1A, BRCA1*, BRCA2*, BRIP1*, CDC73, CDH1*, CDK4, CDKN1B, CDKN2A, CHEK2*, CTNNA1, DICER1, FH, FLCN, KIF1B, LZTR1, MAX, MEN1, MET, MLH1*, MSH2*, MSH3, MSH6*, MUTYH*, NF1*, NF2, NTHL1, PALB2*, PHOX2B, PMS2*, POT1, PRKAR1A, PTCH1, PTEN*, RAD51C*, RAD51D*, RB1, RET, SDHA, SDHAF2, SDHB, SDHC, SDHD, SMAD4, SMARCA4, SMARCB1, SMARCE1, STK11, SUFU, TMEM127, TP53*, TSC1, TSC2, and VHL (sequencing and deletion/duplication); EGFR, EGLN1, HOXB13, KIT, MITF, PDGFRA, POLD1, and POLE (sequencing only); EPCAM and GREM1 (deletion/duplication only). DNA and RNA analyses performed for * genes.      FAMILY HISTORY:  We obtained a detailed,  4-generation family history.  Significant diagnoses are listed below: Family History  Problem Relation Age of Onset   Uterine cancer Mother        dx in her 1s   Pancreatic cancer Father    Cerebral aneurysm Sister    Melanoma Sister 72 - 71       on her leg   Sarcoidosis Sister 18   Lung cancer Brother        smoker   Pancreatic cancer Niece 14 - 98       paternal half brother's daughter   Lung cancer Niece 22 - 69       smoker      GENETIC TEST RESULTS: Genetic testing reported out on August 06, 2023 through the CancerNext-Expanded+RNAinsight cancer panel found no pathogenic mutations. The Multi-Cancer + RNA Panel offered by [facility] includes sequencing and/or deletion/duplication analysis of the following 70 genes:  AIP*, ALK, APC*, ATM*, AXIN2*, BAP1*, BARD1*, BLM*, BMPR1A*, BRCA1*, BRCA2*, BRIP1*, CDC73*, CDH1*, CDK4, CDKN1B*, CDKN2A, CHEK2*, CTNNA1*, DICER1*, EPCAM (del/dup only), EGFR, FH*, FLCN*, GREM1 (promoter dup only), HOXB13, KIT, LZTR1, MAX*, MBD4, MEN1*, MET, MITF, MLH1*, MSH2*, MSH3*, MSH6*, MUTYH*, NF1*, NF2*, NTHL1*, PALB2*, PDGFRA, PMS2*, POLD1*, POLE*, POT1*, PRKAR1A*, PTCH1*, PTEN*, RAD51C*, RAD51D*, RB1*, RET, SDHA* (sequencing only), SDHAF2*, SDHB*, SDHC*, SDHD*, SMAD4*, SMARCA4*, SMARCB1*, SMARCE1*, STK11*, SUFU*, TMEM127*, TP53*, TSC1*, TSC2*, VHL*. RNA analysis is performed for * genes. The test report has been scanned into EPIC and is located under the Molecular Pathology section of the Results Review tab.  A portion of the result report is included below for reference.     We discussed with Wendy [name] that because current genetic testing is not perfect, it is possible there  may be a gene mutation in one of these genes that current testing cannot detect, but that chance is small.  We also discussed, that there could be another gene that has not yet been discovered, or that we have not yet tested, that is responsible for the cancer diagnoses in the family. It is also  possible there is a hereditary cause for the cancer in the family that Wendy [name] did not inherit and therefore was not identified in her testing.  Therefore, it is important to remain in touch with cancer genetics in the future so that we can continue to offer Wendy [name] the most up to date genetic testing.   ADDITIONAL GENETIC TESTING: We discussed with Wendy [name] that her genetic testing was fairly extensive.  If there are genes identified to increase cancer risk that can be analyzed in the future, we would be happy to discuss and coordinate this testing at that time.    CANCER SCREENING RECOMMENDATIONS: Wendy [name] test result is considered negative (normal).  This means that we have not identified a hereditary cause for her personal and family history of pancreatic cancer at this time. Most cancers happen by chance and this negative test suggests that her cancer may fall into this category.    Possible reasons for Wendy [name]'s negative genetic test include:  1. There may be a gene mutation in one of these genes that current testing methods cannot detect but that chance is small.  2. There could be another gene that has not yet been discovered, or that we have not yet tested, that is responsible for the cancer diagnoses in the family.  3.  There may be no hereditary risk for cancer in the family. The cancers in Wendy [name] and/or her family may be sporadic/familial or due to other genetic and environmental factors. 4. It is also possible there is a hereditary cause for the cancer in the family that Wendy [name] did not inherit.  Therefore, it is recommended she continue to follow the cancer management and screening guidelines provided by her oncology and primary healthcare provider. An individual's cancer risk and medical management are not determined by genetic test results alone. Overall cancer risk assessment incorporates additional factors, including personal medical history, family history,  and any available genetic information that may result in a personalized plan for cancer prevention and surveillance  RECOMMENDATIONS FOR FAMILY MEMBERS:  Individuals in this family might be at some increased risk of developing cancer, over the general population risk, simply due to the family history of cancer.  We recommended women in this family have a yearly mammogram beginning at age 49, or 51 years younger than the earliest onset of cancer, an annual clinical breast exam, and perform monthly breast self-exams. Women in this family should also have a gynecological exam as recommended by their primary provider. All family members should be referred for colonoscopy starting at age 34.  The number of individuals in the family with pancreatic cancer constitutes a familial pancreatic cancer pattern.  We discussed with the patient's daughter that it may be worth a discussion with the high risk pancreatic cancer screening program at [facility].  She will discuss this with her PCP.  FOLLOW-UP: Lastly, we discussed with Wendy [name] that cancer genetics is a rapidly advancing field and it is possible that new genetic tests will be appropriate for her and/or her family members in the future. We encouraged her to remain in contact with cancer genetics on an annual  basis so we can update her personal and family histories and let her know of advances in cancer genetics that may benefit this family.   Our contact number was provided. Wendy [name]'s questions were answered to her satisfaction, and she knows she is welcome to call us at anytime with additional questions or concerns.   Maylon Cos, MS, [facility] Licensed, Certified Genetic Counselor [email or web address]

## 2023-08-07 NOTE — Telephone Encounter (Signed)
Revealed negative genetic testing.  Discussed that we do not know why she has pancreatic cancer or why there is cancer in the family. It could be due to a different gene that we are not testing, or maybe our current technology may not be able to pick something up.  It will be important for her to keep in contact with genetics to keep up with whether additional testing may be needed.   Discussed that the family history is consistent with being familial pancreatic cancer.  Patient's daughter may qualify for pancreatic cancer screening.  She will ask her provider for a referral.

## 2023-08-17 ENCOUNTER — Other Ambulatory Visit: Payer: Self-pay | Admitting: Oncology

## 2023-08-19 ENCOUNTER — Inpatient Hospital Stay: Payer: Medicare Other

## 2023-08-19 ENCOUNTER — Other Ambulatory Visit: Payer: Medicare Other

## 2023-08-19 ENCOUNTER — Encounter: Payer: Self-pay | Admitting: *Deleted

## 2023-08-19 ENCOUNTER — Inpatient Hospital Stay (HOSPITAL_BASED_OUTPATIENT_CLINIC_OR_DEPARTMENT_OTHER): Payer: Medicare Other | Admitting: Oncology

## 2023-08-19 ENCOUNTER — Inpatient Hospital Stay: Payer: Medicare Other | Admitting: Licensed Clinical Social Worker

## 2023-08-19 VITALS — BP 156/66 | HR 73 | Temp 98.1°F | Resp 18

## 2023-08-19 VITALS — BP 129/56 | HR 92 | Temp 98.1°F | Resp 18 | Ht 66.0 in | Wt 119.0 lb

## 2023-08-19 DIAGNOSIS — Z5111 Encounter for antineoplastic chemotherapy: Secondary | ICD-10-CM | POA: Diagnosis not present

## 2023-08-19 DIAGNOSIS — C250 Malignant neoplasm of head of pancreas: Secondary | ICD-10-CM

## 2023-08-19 DIAGNOSIS — Z01818 Encounter for other preprocedural examination: Secondary | ICD-10-CM | POA: Diagnosis not present

## 2023-08-19 LAB — CMP (CANCER CENTER ONLY)
ALT: 17 U/L (ref 0–44)
AST: 20 U/L (ref 15–41)
Albumin: 4.2 g/dL (ref 3.5–5.0)
Alkaline Phosphatase: 80 U/L (ref 38–126)
Anion gap: 6 (ref 5–15)
BUN: 31 mg/dL — ABNORMAL HIGH (ref 8–23)
CO2: 25 mmol/L (ref 22–32)
Calcium: 10.2 mg/dL (ref 8.9–10.3)
Chloride: 104 mmol/L (ref 98–111)
Creatinine: 0.65 mg/dL (ref 0.44–1.00)
GFR, Estimated: >60 mL/min (ref >60)
Glucose, Bld: 123 mg/dL — ABNORMAL HIGH (ref 70–99)
Potassium: 4.2 mmol/L (ref 3.5–5.1)
Sodium: 135 mmol/L (ref 135–145)
Total Bilirubin: 0.3 mg/dL (ref 0.3–1.2)
Total Protein: 6.3 g/dL — ABNORMAL LOW (ref 6.5–8.1)

## 2023-08-19 LAB — CBC WITH DIFFERENTIAL (CANCER CENTER ONLY)
Abs Immature Granulocytes: 0.01 10*3/uL (ref 0.00–0.07)
Basophils Absolute: 0 10*3/uL (ref 0.0–0.1)
Basophils Relative: 1 %
Eosinophils Absolute: 0.1 10*3/uL (ref 0.0–0.5)
Eosinophils Relative: 3 %
HCT: 33.6 % — ABNORMAL LOW (ref 36.0–46.0)
Hemoglobin: 10.7 g/dL — ABNORMAL LOW (ref 12.0–15.0)
Immature Granulocytes: 0 %
Lymphocytes Relative: 26 %
Lymphs Abs: 0.9 10*3/uL (ref 0.7–4.0)
MCH: 30.5 pg (ref 26.0–34.0)
MCHC: 31.8 g/dL (ref 30.0–36.0)
MCV: 95.7 fL (ref 80.0–100.0)
Monocytes Absolute: 0.5 10*3/uL (ref 0.1–1.0)
Monocytes Relative: 14 %
Neutro Abs: 2 10*3/uL (ref 1.7–7.7)
Neutrophils Relative %: 56 %
Platelet Count: 370 10*3/uL (ref 150–400)
RBC: 3.51 MIL/uL — ABNORMAL LOW (ref 3.87–5.11)
RDW: 14.3 % (ref 11.5–15.5)
WBC Count: 3.5 10*3/uL — ABNORMAL LOW (ref 4.0–10.5)
nRBC: 0 % (ref 0.0–0.2)

## 2023-08-19 MED ORDER — SODIUM CHLORIDE 0.9 % IV SOLN: Freq: Once | INTRAVENOUS | Status: AC

## 2023-08-19 MED ORDER — PROCHLORPERAZINE MALEATE 10 MG PO TABS
10.0000 mg | ORAL_TABLET | Freq: Once | ORAL | Status: AC
  Administered 2023-08-19: 10 mg via ORAL
  Filled 2023-08-19: qty 1

## 2023-08-19 MED ORDER — SODIUM CHLORIDE 0.9% FLUSH
10.0000 mL | INTRAVENOUS | Status: DC | PRN
  Administered 2023-08-19: 10 mL

## 2023-08-19 MED ORDER — SODIUM CHLORIDE 0.9 % IV SOLN
1000.0000 mg/m2 | Freq: Once | INTRAVENOUS | Status: AC
  Administered 2023-08-19: 1634 mg via INTRAVENOUS
  Filled 2023-08-19: qty 25.98

## 2023-08-19 MED ORDER — PACLITAXEL PROTEIN-BOUND CHEMO INJECTION 100 MG
100.0000 mg/m2 | Freq: Once | INTRAVENOUS | Status: AC
  Administered 2023-08-19: 150 mg via INTRAVENOUS
  Filled 2023-08-19: qty 30

## 2023-08-19 MED ORDER — HEPARIN SOD (PORK) LOCK FLUSH 100 UNIT/ML IV SOLN
500.0000 [IU] | Freq: Once | INTRAVENOUS | Status: AC | PRN
  Administered 2023-08-19: 500 [IU]

## 2023-08-19 NOTE — Progress Notes (Signed)
  [facility] OFFICE PROGRESS NOTE   Diagnosis: Pancreas cancer  INTERVAL HISTORY:   Wendy [name] completed cycle 1 gemcitabine/Abraxane 08/05/2023.  She reports tolerated the treatment well.  No nausea, rash, fever, or neuropathy symptoms.  She is tolerating a diet.  She is getting out of the house.  She has mild pain in the right upper abdomen and back.  She takes Tylenol for pain.  Objective:  Vital signs in last 24 hours:  Blood pressure (!) 129/56, pulse 92, temperature 98.1 F (36.7 C), temperature source Oral, resp. rate 18, height 5\' 6"  (1.676 m), weight 119 lb (54 kg), SpO2 100%.    HEENT: No thrush or ulcers Resp: Lungs clear bilaterally Cardio: Regular rate and rhythm GI: No hepatosplenomegaly, tender in the right upper abdomen, no mass Vascular: No leg edema  Skin: No rash  Portacath/PICC-without erythema  Lab Results:  Lab Results  Component Value Date   WBC 3.5 (L) 08/19/2023   HGB 10.7 (L) 08/19/2023   HCT 33.6 (L) 08/19/2023   MCV 95.7 08/19/2023   PLT 370 08/19/2023   NEUTROABS 2.0 08/19/2023    CMP  Lab Results  Component Value Date   NA 138 08/05/2023   K 4.0 08/05/2023   CL 106 08/05/2023   CO2 26 08/05/2023   GLUCOSE 113 (H) 08/05/2023   BUN 29 (H) 08/05/2023   CREATININE 0.70 08/05/2023   CALCIUM 10.1 08/05/2023   PROT 6.3 (L) 08/05/2023   ALBUMIN 4.2 08/05/2023   AST 19 08/05/2023   ALT 14 08/05/2023   ALKPHOS 62 08/05/2023   BILITOT 0.3 08/05/2023   GFRNONAA >60 08/05/2023    Lab Results  Component Value Date   WGN562 520 (H) 08/05/2023   ed the patient's current medications.   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   Fluid-filled and distended stomach and proximal duodenum, single 11 mm periportal lymph node EGD 07/08/2023-large food residue in the stomach, extrinsic deformity at the first portion of the duodenum-biopsied Elevated CA 19-9 CT chest 07/11/2023-scattered small pulmonary nodules-indeterminate Cycle 1 gemcitabine/Abraxane 08/05/2023 Cycle 2 of Cytovene/Abraxane 08/19/2023 2.   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   Anemia 4.   Iron deficiency 5.   B12 deficiency 6.   Atrial fibrillation-maintained on Xarelto prior to hospital admission 7.  Family history of pancreas and uterine cancer 8.  Hyperlipidemia     Disposition: [name] tolerated the first cycle of gemcitabine/Abraxane well.  She will complete cycle 2 today.  She will return for an office visit and chemotherapy in 2 weeks.  She will contact us if she develops increased pain.  We will check the CA 19-9 when she returns in 2 weeks.  [name], MD  08/19/2023  11:38 AM

## 2023-08-19 NOTE — Patient Instructions (Signed)
Gemcitabine Injection What is this medication? GEMCITABINE (jem SYE ta been) treats some types of cancer. It works by slowing down the growth of cancer cells. This medicine may be used for other purposes; ask your health care provider or pharmacist if you have questions. COMMON BRAND NAME(S): Gemzar, Infugem What should I tell my care team before I take this medication? They need to know if you have any of these conditions: Blood disorders Infection Kidney disease Liver disease Lung or breathing disease, such as asthma or COPD Recent or ongoing radiation therapy An unusual or allergic reaction to gemcitabine, other medications, foods, dyes, or preservatives If you or your partner are pregnant or trying to get pregnant Breast-feeding How should I use this medication? This medication is injected into a vein. It is given by your care team in a hospital or clinic setting. Talk to your care team about the use of this medication in children. Special care may be needed. Overdosage: If you think you have taken too much of this medicine contact a poison control center or emergency room at once. NOTE: This medicine is only for you. Do not share this medicine with others. What if I miss a dose? Keep appointments for follow-up doses. It is important not to miss your dose. Call your care team if you are unable to keep an appointment. What may interact with this medication? Interactions have not been studied. This list may not describe all possible interactions. Give your health care provider a list of all the medicines, herbs, non-prescription drugs, or dietary supplements you use. Also tell them if you smoke, drink alcohol, or use illegal drugs. Some items may interact with your medicine. What should I watch for while using this medication? Your condition will be monitored carefully while you are receiving this medication. This medication may make you feel generally unwell. This is not uncommon, as  chemotherapy can affect healthy cells as well as cancer cells. Report any side effects. Continue your course of treatment even though you feel ill unless your care team tells you to stop. In some cases, you may be given additional medications to help with side effects. Follow all directions for their use. This medication may increase your risk of getting an infection. Call your care team for advice if you get a fever, chills, sore throat, or other symptoms of a cold or flu. Do not treat yourself. Try to avoid being around people who are sick. This medication may increase your risk to bruise or bleed. Call your care team if you notice any unusual bleeding. Be careful brushing or flossing your teeth or using a toothpick because you may get an infection or bleed more easily. If you have any dental work done, tell your dentist you are receiving this medication. Avoid taking medications that contain aspirin, acetaminophen, ibuprofen, naproxen, or ketoprofen unless instructed by your care team. These medications may hide a fever. Talk to your care team if you or your partner wish to become pregnant or think you might be pregnant. This medication can cause serious birth defects if taken during pregnancy and for 6 months after the last dose. A negative pregnancy test is required before starting this medication. A reliable form of contraception is recommended while taking this medication and for 6 months after the last dose. Talk to your care team about effective forms of contraception. Do not father a child while taking this medication and for 3 months after the last dose. Use a condom while having sex  during this time period. Do not breastfeed while taking this medication and for at least 1 week after the last dose. This medication may cause infertility. Talk to your care team if you are concerned about your fertility. What side effects may I notice from receiving this medication? Side effects that you should  report to your care team as soon as possible: Allergic reactions--skin rash, itching, hives, swelling of the face, lips, tongue, or throat Capillary leak syndrome--stomach or muscle pain, unusual weakness or fatigue, feeling faint or lightheaded, decrease in the amount of urine, swelling of the ankles, hands, or feet, trouble breathing Infection--fever, chills, cough, sore throat, wounds that don't heal, pain or trouble when passing urine, general feeling of discomfort or being unwell Liver injury--right upper belly pain, loss of appetite, nausea, light-colored stool, dark yellow or brown urine, yellowing skin or eyes, unusual weakness or fatigue Low red blood cell level--unusual weakness or fatigue, dizziness, headache, trouble breathing Lung injury--shortness of breath or trouble breathing, cough, spitting up blood, chest pain, fever Stomach pain, bloody diarrhea, pale skin, unusual weakness or fatigue, decrease in the amount of urine, which may be signs of hemolytic uremic syndrome Sudden and severe headache, confusion, change in vision, seizures, which may be signs of posterior reversible encephalopathy syndrome (PRES) Unusual bruising or bleeding Side effects that usually do not require medical attention (report to your care team if they continue or are bothersome): Diarrhea Drowsiness Hair loss Nausea Pain, redness, or swelling with sores inside the mouth or throat Vomiting This list may not describe all possible side effects. Call your doctor for medical advice about side effects. You may report side effects to FDA at 1-800-FDA-1088. Where should I keep my medication? This medication is given in a hospital or clinic. It will not be stored at home. NOTE: This sheet is a summary. It may not cover all possible information. If you have questions about this medicine, talk to your doctor, pharmacist, or health care provider. Paclitaxel Nanoparticle Albumin-Bound Injection What is this  medication? NANOPARTICLE ALBUMIN-BOUND PACLITAXEL (Na no PAHR ti kuhl al BYOO muhn-bound PAK li TAX el) treats some types of cancer. It works by slowing down the growth of cancer cells. This medicine may be used for other purposes; ask your health care provider or pharmacist if you have questions. COMMON BRAND NAME(S): Abraxane What should I tell my care team before I take this medication? They need to know if you have any of these conditions: Liver disease Low white blood cell levels An unusual or allergic reaction to paclitaxel, albumin, other medications, foods, dyes, or preservatives If you or your partner are pregnant or trying to get pregnant Breast-feeding How should I use this medication? This medication is injected into a vein. It is given by your care team in a hospital or clinic setting. Talk to your care team about the use of this medication in children. Special care may be needed. Overdosage: If you think you have taken too much of this medicine contact a poison control center or emergency room at once. NOTE: This medicine is only for you. Do not share this medicine with others. What if I miss a dose? Keep appointments for follow-up doses. It is important not to miss your dose. Call your care team if you are unable to keep an appointment. What may interact with this medication? Other medications may affect the way this medication works. Talk with your care team about all of the medications you take. They may suggest changes  to your treatment plan to lower the risk of side effects and to make sure your medications work as intended. This list may not describe all possible interactions. Give your health care provider a list of all the medicines, herbs, non-prescription drugs, or dietary supplements you use. Also tell them if you smoke, drink alcohol, or use illegal drugs. Some items may interact with your medicine. What should I watch for while using this medication? Your condition  will be monitored carefully while you are receiving this medication. You may need blood work while taking this medication. This medication may make you feel generally unwell. This is not uncommon as chemotherapy can affect healthy cells as well as cancer cells. Report any side effects. Continue your course of treatment even though you feel ill unless your care team tells you to stop. This medication can cause serious allergic reactions. To reduce the risk, your care team may give you other medications to take before receiving this one. Be sure to follow the directions from your care team. This medication may increase your risk of getting an infection. Call your care team for advice if you get a fever, chills, sore throat, or other symptoms of a cold or flu. Do not treat yourself. Try to avoid being around people who are sick. This medication may increase your risk to bruise or bleed. Call your care team if you notice any unusual bleeding. Be careful brushing or flossing your teeth or using a toothpick because you may get an infection or bleed more easily. If you have any dental work done, tell your dentist you are receiving this medication. Talk to your care team if you or your partner may be pregnant. Serious birth defects can occur if you take this medication during pregnancy and for 6 months after the last dose. You will need a negative pregnancy test before starting this medication. Contraception is recommended while taking this medication and for 6 months after the last dose. Your care team can help you find the option that works for you. If your partner can get pregnant, use a condom during sex while taking this medication and for 3 months after the last dose. Do not breastfeed while taking this medication and for 2 weeks after the last dose. This medication may cause infertility. Talk to your care team if you are concerned about your fertility. What side effects may I notice from receiving this  medication? Side effects that you should report to your care team as soon as possible: Allergic reactions--skin rash, itching, hives, swelling of the face, lips, tongue, or throat Dry cough, shortness of breath or trouble breathing Infection--fever, chills, cough, sore throat, wounds that don't heal, pain or trouble when passing urine, general feeling of discomfort or being unwell Low red blood cell level--unusual weakness or fatigue, dizziness, headache, trouble breathing Pain, tingling, or numbness in the hands or feet Stomach pain, unusual weakness or fatigue, nausea, vomiting, diarrhea, or fever that lasts longer than expected Unusual bruising or bleeding Side effects that usually do not require medical attention (report to your care team if they continue or are bothersome): Diarrhea Fatigue Hair loss Loss of appetite Nausea Vomiting This list may not describe all possible side effects. Call your doctor for medical advice about side effects. You may report side effects to FDA at 1-800-FDA-1088. Where should I keep my medication? This medication is given in a hospital or clinic. It will not be stored at home. NOTE: This sheet is a summary. It may  not cover all possible information. If you have questions about this medicine, talk to your doctor, pharmacist, or health care provider.  2024 Elsevier/Gold Standard (2022-05-02 00:00:00)   2024 Elsevier/Gold Standard (2022-04-23 00:00:00)

## 2023-08-19 NOTE — Progress Notes (Signed)
Patient seen by Dr. Sherrill today ? ?Vitals are within treatment parameters. ? ?Labs reviewed by Dr. Sherrill and are within treatment parameters. ? ?Per physician team, patient is ready for treatment and there are NO modifications to the treatment plan.  ?

## 2023-08-19 NOTE — Progress Notes (Signed)
[facility] CSW Progress Note  Visual merchandiser met with patient to assess needs.  Patient reports that she has only experienced minimal fatigue since her first treatment.  She remains active and has a good support system.  No needs at this time.    Dorothey Baseman, LCSW Clinical Social Worker [facility]

## 2023-08-20 ENCOUNTER — Other Ambulatory Visit: Payer: Self-pay

## 2023-08-21 NOTE — Progress Notes (Unsigned)
Cardiology Office Note   Date:  08/26/2023   ID:  Wendy [name], DOB Apr 15, 1934, MRN [id number]  PCP:  [name], MD  Cardiologist:   Radie Berges [name], MD   Chief Complaint  Patient presents with   Atrial Fibrillation      History of Present Illness: Wendy [name] is a 89 y.o. female who is seen for follow up evaluation of atrial fibrillation. She has a history of PACs noted on Ecg in 2016 and 2017. She has a history of HTN and HLD.   Seen in October 2020  she was found to be in Afib with controlled rate. This was part of a routine physical.  She was asymptomatic. She was started on Xarelto. Echo showed normal LV function. Mild biatrial enlargement. Mild to moderate MR with calcified valve and annulus.   She was admitted in July with nausea. Found to have a pancreatic mass. Had duodenal stent placed. Due to bleeding risk Xarelto was discontinued. 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 Followed by Dr Truett Perna. Started on chemotherapy in early August with gemcitabine/Abraxane. Surgery is being considered after chemo. She is doing OK. Diagnosis was a shock. She has lost 12 lbs and appetite isn't great but she denies any pain and is tolerating chemo well. Reports BP when she goes in for treatment is usually 120.    Past Medical History:  Diagnosis Date   Arthritis    Family history of melanoma    Family history of pancreatic cancer    Family history of uterine cancer    Hyperlipidemia    Hypertension    pancreatic ca 06/2023   new dx   Vitamin D deficiency     Past Surgical History:  Procedure Laterality Date   BIOPSY  07/08/2023   Procedure: BIOPSY;  Surgeon: Vida Rigger, MD;  Location: [facility];  Service: Gastroenterology;;   CATARACT EXTRACTION Bilateral    DUODENAL STENT PLACEMENT N/A 07/10/2023   Procedure: DUODENAL STENT PLACEMENT;  Surgeon: Vida Rigger, MD;  Location: [facility];  Service: Gastroenterology;  Laterality: N/A;    ESOPHAGOGASTRODUODENOSCOPY (EGD) WITH PROPOFOL N/A 07/08/2023   Procedure: ESOPHAGOGASTRODUODENOSCOPY (EGD) WITH PROPOFOL;  Surgeon: Vida Rigger, MD;  Location: [facility];  Service: Gastroenterology;  Laterality: N/A;   ESOPHAGOGASTRODUODENOSCOPY (EGD) WITH PROPOFOL N/A 07/10/2023   Procedure: ESOPHAGOGASTRODUODENOSCOPY (EGD) WITH PROPOFOL;  Surgeon: Vida Rigger, MD;  Location: [facility];  Service: Gastroenterology;  Laterality: N/A;   PORTACATH PLACEMENT N/A 08/04/2023   Procedure: INSERTION PORT-A-CATH;  Surgeon: [name], MD;  Location: [facility];  Service: General;  Laterality: N/A;     Current Outpatient Medications  Medication Sig Dispense Refill   acetaminophen (TYLENOL) 500 MG tablet Take 500-1,000 mg by mouth every 6 (six) hours as needed for moderate pain (ususally at night).     benazepril (LOTENSIN) 20 MG tablet Take 20 mg by mouth daily.     cyanocobalamin (VITAMIN B12) 1000 MCG tablet Take 1,000 mcg by mouth daily.     docusate sodium (COLACE) 100 MG capsule Take 1 capsule (100 mg total) by mouth 2 (two) times daily. (Patient taking differently: Take 100 mg by mouth 2 (two) times daily as needed for moderate constipation.) 10 capsule 0   famotidine (PEPCID) 40 MG tablet Take 40 mg by mouth daily.     ferrous sulfate 325 (65 FE) MG EC tablet Take 1 tablet (325 mg total) by mouth daily with breakfast. 30 tablet 1   lidocaine-prilocaine (EMLA) cream Apply  1 Application topically as needed (apply to Port 1-2 hours prior to appointment). 30 g 0   Multiple Vitamin (MULTIVITAMIN WITH MINERALS) TABS tablet Take 1 tablet by mouth daily. 30 tablet 0   ondansetron (ZOFRAN) 4 MG tablet Take 1 tablet (4 mg total) by mouth every 6 (six) hours as needed for nausea. 20 tablet 0   polyethylene glycol (MIRALAX / GLYCOLAX) 17 g packet Take 17 g by mouth daily as needed for mild constipation. 14 each 0   prochlorperazine (COMPAZINE) 5 MG tablet Take 1 tablet (5 mg total) by mouth every 6 (six)  hours as needed for nausea or vomiting. 30 tablet 2   simvastatin (ZOCOR) 40 MG tablet Take 20 mg by mouth daily with supper.     No current facility-administered medications for this visit.    Allergies:   Bactrim [sulfamethoxazole-trimethoprim] and Protonix [pantoprazole]    Social History:  The patient  reports that she has never smoked. She has never used smokeless tobacco. She reports that she does not drink alcohol and does not use drugs.   Family History:  The patient's family history includes Cerebral aneurysm in her sister; Lung cancer in her brother; Lung cancer (age of onset: 34 - 32) in her niece; Melanoma (age of onset: 51 - 55) in her sister; Pancreatic cancer in her father; Pancreatic cancer (age of onset: 24 - 74) in her niece; Sarcoidosis (age of onset: 50) in her sister; Uterine cancer in her mother.    ROS:  Please see the history of present illness.   Otherwise, review of systems are positive for none.   All other systems are reviewed and negative.    PHYSICAL EXAM: VS:  BP (!) 150/80 (BP Location: Left Arm, Cuff Size: Small)   Pulse 62   Ht 5\' 6"  (1.676 m)   Wt 120 lb 6.4 oz (54.6 kg)   SpO2 99%   BMI 19.43 kg/m  , BMI Body mass index is 19.43 kg/m. GEN: Well nourished, well developed, in no acute distress  HEENT: normal  Neck: no JVD, carotid bruits, or masses Cardiac: IRRR; there is a grade 1/6 early diastolic murmur heard at the RUSB to LSB, no rubs or gallops,no edema  Respiratory:  clear to auscultation bilaterally, normal work of breathing GI: soft, nontender, nondistended, + BS MS: no deformity or atrophy  Skin: warm and dry, no rash Neuro:  Strength and sensation are intact Psych: euthymic mood, full affect   EKG:  EKG is reviewed from July 8- Afib rate 97. Nonspecific ST abnormality. I have personally reviewed and interpreted this study.      Recent Labs: 08/19/2023: ALT 17; BUN 31; Creatinine 0.65; Hemoglobin 10.7; Platelet Count 370;  Potassium 4.2; Sodium 135    Lipid Panel No results found for: "CHOL", "TRIG", "HDL", "CHOLHDL", "VLDL", "LDLCALC", "LDLDIRECT"    Wt Readings from Last 3 Encounters:  08/26/23 120 lb 6.4 oz (54.6 kg)  08/19/23 119 lb (54 kg)  08/05/23 120 lb 3.2 oz (54.5 kg)      Other studies Reviewed: Additional studies/ records that were reviewed today include: Labs dated 03/27/17: Cholesterol 124, triglycerides 106, HDL 49, LDL 54. Normal CMET and Hgb.  Dated 10/07/19: cholesterol 141, triglycerides 90, HDL 52, LDL 71. CBC, BMET, AST normal.  Dated 04/06/20: cholesterol 135, triglycerides 73, HDL 49, LDL 71. CBC and CMET normal.  Dated 07/05/20: A1c 6.2%.  Dated 10/24/20: A1c 6.1%. cholesterol 132, triglycerides 98, HDL 47, LDL 66. CMET and CBC normal.  Dated 11/06/21: cholesterol 143, triglycerides 87, HDL 52, LDL 74. A1c 6%. CMET and CBC normal. Dated 11/13/22: cholesterol 137, triglycerides 88, HDL 55, LDL 65.  Dated 07/16/23: A1c 5%  Echo 10/14/19:  IMPRESSIONS      1. Left ventricular ejection fraction, by visual estimation, is 55 to 60%. The left ventricle has normal function. There is no left ventricular hypertrophy.  2. Global right ventricle has normal systolic function.The right ventricular size is normal. No increase in right ventricular wall thickness.  3. Left atrial size was mildly dilated.  4. Right atrial size was mildly dilated.  5. Moderate calcification of the mitral valve leaflet(s).  6. Moderate mitral annular calcification.  7. Moderate thickening of the mitral valve leaflet(s).  8. The mitral valve is degenerative. Mild to moderate mitral valve regurgitation.  9. MR not well interrogated Restricted posterior leaflet motion with posteriorly directed jet can consider TEE to further evaluate. 10. The tricuspid valve is normal in structure. Tricuspid valve regurgitation moderate. 11. The aortic valve is tricuspid Aortic valve regurgitation is mild to moderate by color flow  Doppler. 12. There is Moderate calcification of the aortic valve. 13. There is Moderate thickening of the aortic valve. 14. The pulmonic valve was grossly normal. Pulmonic valve regurgitation is mild by color flow Doppler. 15. Aortic dilatation noted. 16. There is mild dilatation of the aortic root measuring 38 mm. 17. Moderately elevated pulmonary artery systolic pressure.  ASSESSMENT AND PLAN:  1. Atrial fibrillation.  Rate is controlled on no medication and she is  asymptomatic. [place] vasc of 4.  Continue current therapy. Xarelto is on hold given higher bleeding risk with pancreatic mass and invasion of duodenum. Potentially this could be resumed if she is able to have surgery. 2. Mild to moderate mitral insufficiency. 3. HTN  elevated today but patient reports BP controlled.  4. HLD well controlled on current therapy. LDL 65.  5. AdenoCA pancreas. Currently receiving chemotherapy. S/p duodenal stent for gastric outlet obstruction. Potential surgery in future. Cleared for this from a cardiac standpoint.    Current medicines are reviewed at length with the patient today.  The patient does not have concerns regarding medicines.  The following changes have been made:  no change  Labs/ tests ordered today include:   No orders of the defined types were placed in this encounter.    Disposition:   FU one year  Signed, Iysis Germain [name], MD  08/26/2023 10:07 AM    [facility] [address], [place], [place], [address] Phone [phone], Fax [phone]

## 2023-08-26 ENCOUNTER — Encounter: Payer: Self-pay | Admitting: Cardiology

## 2023-08-26 ENCOUNTER — Ambulatory Visit: Payer: Medicare Other | Admitting: Cardiology

## 2023-08-26 VITALS — BP 150/80 | HR 62 | Ht 66.0 in | Wt 120.4 lb

## 2023-08-26 DIAGNOSIS — I4819 Other persistent atrial fibrillation: Secondary | ICD-10-CM | POA: Diagnosis not present

## 2023-08-26 DIAGNOSIS — I10 Essential (primary) hypertension: Secondary | ICD-10-CM | POA: Insufficient documentation

## 2023-08-26 NOTE — Patient Instructions (Signed)
Medication Instructions:  Continue same medications *If you need a refill on your cardiac medications before your next appointment, please call your pharmacy*   Lab Work: None ordered   Testing/Procedures: None ordered   Follow-Up: At [facility], you and your health needs are our priority.  As part of our continuing mission to provide you with exceptional heart care, we have created designated Provider Care Teams.  These Care Teams include your primary Cardiologist (physician) and Advanced Practice Providers (APPs -  Physician Assistants and Nurse Practitioners) who all work together to provide you with the care you need, when you need it.  We recommend signing up for the patient portal called "MyChart".  Sign up information is provided on this After Visit Summary.  MyChart is used to connect with patients for Virtual Visits (Telemedicine).  Patients are able to view lab/test results, encounter notes, upcoming appointments, etc.  Non-urgent messages can be sent to your provider as well.   To learn more about what you can do with MyChart, go to [email or web address].    Your next appointment:  1 year    Call in May to schedule August appointment     Provider:  Dr.Jordan

## 2023-09-02 ENCOUNTER — Inpatient Hospital Stay: Payer: Medicare Other

## 2023-09-02 ENCOUNTER — Inpatient Hospital Stay: Payer: Medicare Other | Attending: Oncology | Admitting: Nurse Practitioner

## 2023-09-02 ENCOUNTER — Other Ambulatory Visit: Payer: Medicare Other

## 2023-09-02 ENCOUNTER — Encounter: Payer: Self-pay | Admitting: Nurse Practitioner

## 2023-09-02 VITALS — BP 138/74 | HR 85

## 2023-09-02 VITALS — BP 142/76 | HR 90 | Temp 97.9°F | Resp 20 | Ht 66.0 in | Wt 121.6 lb

## 2023-09-02 DIAGNOSIS — C250 Malignant neoplasm of head of pancreas: Secondary | ICD-10-CM | POA: Diagnosis not present

## 2023-09-02 DIAGNOSIS — Z5111 Encounter for antineoplastic chemotherapy: Secondary | ICD-10-CM | POA: Insufficient documentation

## 2023-09-02 LAB — CBC WITH DIFFERENTIAL (CANCER CENTER ONLY)
Abs Immature Granulocytes: 0.02 10*3/uL (ref 0.00–0.07)
Basophils Absolute: 0.1 10*3/uL (ref 0.0–0.1)
Basophils Relative: 1 %
Eosinophils Absolute: 0.1 10*3/uL (ref 0.0–0.5)
Eosinophils Relative: 2 %
HCT: 32.8 % — ABNORMAL LOW (ref 36.0–46.0)
Hemoglobin: 10.5 g/dL — ABNORMAL LOW (ref 12.0–15.0)
Immature Granulocytes: 1 %
Lymphocytes Relative: 22 %
Lymphs Abs: 1 10*3/uL (ref 0.7–4.0)
MCH: 29.9 pg (ref 26.0–34.0)
MCHC: 32 g/dL (ref 30.0–36.0)
MCV: 93.4 fL (ref 80.0–100.0)
Monocytes Absolute: 0.5 10*3/uL (ref 0.1–1.0)
Monocytes Relative: 12 %
Neutro Abs: 2.7 10*3/uL (ref 1.7–7.7)
Neutrophils Relative %: 62 %
Platelet Count: 240 10*3/uL (ref 150–400)
RBC: 3.51 MIL/uL — ABNORMAL LOW (ref 3.87–5.11)
RDW: 14 % (ref 11.5–15.5)
WBC Count: 4.4 10*3/uL (ref 4.0–10.5)
nRBC: 0 % (ref 0.0–0.2)

## 2023-09-02 LAB — CMP (CANCER CENTER ONLY)
ALT: 14 U/L (ref 0–44)
AST: 18 U/L (ref 15–41)
Albumin: 3.9 g/dL (ref 3.5–5.0)
Alkaline Phosphatase: 73 U/L (ref 38–126)
Anion gap: 5 (ref 5–15)
BUN: 26 mg/dL — ABNORMAL HIGH (ref 8–23)
CO2: 26 mmol/L (ref 22–32)
Calcium: 9.3 mg/dL (ref 8.9–10.3)
Chloride: 104 mmol/L (ref 98–111)
Creatinine: 0.64 mg/dL (ref 0.44–1.00)
GFR, Estimated: >60 mL/min (ref >60)
Glucose, Bld: 108 mg/dL — ABNORMAL HIGH (ref 70–99)
Potassium: 4.5 mmol/L (ref 3.5–5.1)
Sodium: 135 mmol/L (ref 135–145)
Total Bilirubin: 0.3 mg/dL (ref 0.3–1.2)
Total Protein: 6.2 g/dL — ABNORMAL LOW (ref 6.5–8.1)

## 2023-09-02 MED ORDER — SODIUM CHLORIDE 0.9% FLUSH
10.0000 mL | INTRAVENOUS | Status: DC | PRN
  Administered 2023-09-02: 10 mL

## 2023-09-02 MED ORDER — PACLITAXEL PROTEIN-BOUND CHEMO INJECTION 100 MG
100.0000 mg/m2 | Freq: Once | INTRAVENOUS | Status: AC
  Administered 2023-09-02: 150 mg via INTRAVENOUS
  Filled 2023-09-02: qty 30

## 2023-09-02 MED ORDER — SODIUM CHLORIDE 0.9 % IV SOLN
1000.0000 mg/m2 | Freq: Once | INTRAVENOUS | Status: AC
  Administered 2023-09-02: 1634 mg via INTRAVENOUS
  Filled 2023-09-02: qty 25.98

## 2023-09-02 MED ORDER — SODIUM CHLORIDE 0.9 % IV SOLN: Freq: Once | INTRAVENOUS | Status: AC

## 2023-09-02 MED ORDER — PROCHLORPERAZINE MALEATE 10 MG PO TABS
10.0000 mg | ORAL_TABLET | Freq: Once | ORAL | Status: AC
  Administered 2023-09-02: 10 mg via ORAL
  Filled 2023-09-02: qty 1

## 2023-09-02 MED ORDER — HEPARIN SOD (PORK) LOCK FLUSH 100 UNIT/ML IV SOLN
500.0000 [IU] | Freq: Once | INTRAVENOUS | Status: AC | PRN
  Administered 2023-09-02: 500 [IU]

## 2023-09-02 NOTE — Patient Instructions (Signed)
[facility]   Discharge Instructions: Thank you for choosing [facility] to provide your oncology and hematology care.   If you have a lab appointment with the Cancer Center, please go directly to the Cancer Center and check in at the registration area.   Wear comfortable clothing and clothing appropriate for easy access to any Portacath or PICC line.   We strive to give you quality time with your provider. You may need to reschedule your appointment if you arrive late (15 or more minutes).  Arriving late affects you and other patients whose appointments are after yours.  Also, if you miss three or more appointments without notifying the office, you may be dismissed from the clinic at the provider's discretion.      For prescription refill requests, have your pharmacy contact our office and allow 72 hours for refills to be completed.    Today you received the following chemotherapy and/or immunotherapy agents Abraxane/Gemzar      To help prevent nausea and vomiting after your treatment, we encourage you to take your nausea medication as directed.  BELOW ARE SYMPTOMS THAT SHOULD BE REPORTED IMMEDIATELY: *FEVER GREATER THAN 100.4 F (38 C) OR HIGHER *CHILLS OR SWEATING *NAUSEA AND VOMITING THAT IS NOT CONTROLLED WITH YOUR NAUSEA MEDICATION *UNUSUAL SHORTNESS OF BREATH *UNUSUAL BRUISING OR BLEEDING *URINARY PROBLEMS (pain or burning when urinating, or frequent urination) *BOWEL PROBLEMS (unusual diarrhea, constipation, pain near the anus) TENDERNESS IN MOUTH AND THROAT WITH OR WITHOUT PRESENCE OF ULCERS (sore throat, sores in mouth, or a toothache) UNUSUAL RASH, SWELLING OR PAIN  UNUSUAL VAGINAL DISCHARGE OR ITCHING   Items with * indicate a potential emergency and should be followed up as soon as possible or go to the Emergency Department if any problems should occur.  Please show the CHEMOTHERAPY ALERT CARD or IMMUNOTHERAPY ALERT CARD at  check-in to the Emergency Department and triage nurse.  Should you have questions after your visit or need to cancel or reschedule your appointment, please contact [facility]  Dept: [phone]  and follow the prompts.  Office hours are 8:00 a.m. to 4:30 p.m. Monday - Friday. Please note that voicemails left after 4:00 p.m. may not be returned until the following business day.  We are closed weekends and major holidays. You have access to a nurse at all times for urgent questions. Please call the main number to the clinic Dept: [phone] and follow the prompts.   For any non-urgent questions, you may also contact your provider using MyChart. We now offer e-Visits for anyone 18 and older to request care online for non-urgent symptoms. For details visit [email or web address].   Also download the MyChart app! Go to the app store, search "MyChart", open the app, select [facility], and log in with your MyChart username and password.  Paclitaxel Nanoparticle Albumin-Bound Injection What is this medication? NANOPARTICLE ALBUMIN-BOUND PACLITAXEL (Na no PAHR ti kuhl al BYOO muhn-bound PAK li TAX el) treats some types of cancer. It works by slowing down the growth of cancer cells. This medicine may be used for other purposes; ask your health care provider or pharmacist if you have questions. COMMON BRAND NAME(S): Abraxane What should I tell my care team before I take this medication? They need to know if you have any of these conditions: Liver disease Low white blood cell levels An unusual or allergic reaction to paclitaxel, albumin, other medications, foods, dyes, or preservatives If you   or your partner are pregnant or trying to get pregnant Breast-feeding How should I use this medication? This medication is injected into a vein. It is given by your care team in a hospital or clinic setting. Talk to your care team about the use of this medication in children.  Special care may be needed. Overdosage: If you think you have taken too much of this medicine contact a poison control center or emergency room at once. NOTE: This medicine is only for you. Do not share this medicine with others. What if I miss a dose? Keep appointments for follow-up doses. It is important not to miss your dose. Call your care team if you are unable to keep an appointment. What may interact with this medication? Other medications may affect the way this medication works. Talk with your care team about all of the medications you take. They may suggest changes to your treatment plan to lower the risk of side effects and to make sure your medications work as intended. This list may not describe all possible interactions. Give your health care provider a list of all the medicines, herbs, non-prescription drugs, or dietary supplements you use. Also tell them if you smoke, drink alcohol, or use illegal drugs. Some items may interact with your medicine. What should I watch for while using this medication? Your condition will be monitored carefully while you are receiving this medication. You may need blood work while taking this medication. This medication may make you feel generally unwell. This is not uncommon as chemotherapy can affect healthy cells as well as cancer cells. Report any side effects. Continue your course of treatment even though you feel ill unless your care team tells you to stop. This medication can cause serious allergic reactions. To reduce the risk, your care team may give you other medications to take before receiving this one. Be sure to follow the directions from your care team. This medication may increase your risk of getting an infection. Call your care team for advice if you get a fever, chills, sore throat, or other symptoms of a cold or flu. Do not treat yourself. Try to avoid being around people who are sick. This medication may increase your risk to bruise or  bleed. Call your care team if you notice any unusual bleeding. Be careful brushing or flossing your teeth or using a toothpick because you may get an infection or bleed more easily. If you have any dental work done, tell your dentist you are receiving this medication. Talk to your care team if you or your partner may be pregnant. Serious birth defects can occur if you take this medication during pregnancy and for 6 months after the last dose. You will need a negative pregnancy test before starting this medication. Contraception is recommended while taking this medication and for 6 months after the last dose. Your care team can help you find the option that works for you. If your partner can get pregnant, use a condom during sex while taking this medication and for 3 months after the last dose. Do not breastfeed while taking this medication and for 2 weeks after the last dose. This medication may cause infertility. Talk to your care team if you are concerned about your fertility. What side effects may I notice from receiving this medication? Side effects that you should report to your care team as soon as possible: Allergic reactions--skin rash, itching, hives, swelling of the face, lips, tongue, or throat Dry cough, shortness of   breath or trouble breathing Infection--fever, chills, cough, sore throat, wounds that don't heal, pain or trouble when passing urine, general feeling of discomfort or being unwell Low red blood cell level--unusual weakness or fatigue, dizziness, headache, trouble breathing Pain, tingling, or numbness in the hands or feet Stomach pain, unusual weakness or fatigue, nausea, vomiting, diarrhea, or fever that lasts longer than expected Unusual bruising or bleeding Side effects that usually do not require medical attention (report to your care team if they continue or are bothersome): Diarrhea Fatigue Hair loss Loss of appetite Nausea Vomiting This list may not describe all  possible side effects. Call your doctor for medical advice about side effects. You may report side effects to FDA at 1-800-FDA-1088. Where should I keep my medication? This medication is given in a hospital or clinic. It will not be stored at home. NOTE: This sheet is a summary. It may not cover all possible information. If you have questions about this medicine, talk to your doctor, pharmacist, or health care provider.  2024 Elsevier/Gold Standard (2022-05-02 00:00:00)  Gemcitabine Injection What is this medication? GEMCITABINE (jem SYE ta been) treats some types of cancer. It works by slowing down the growth of cancer cells. This medicine may be used for other purposes; ask your health care provider or pharmacist if you have questions. COMMON BRAND NAME(S): Gemzar, Infugem What should I tell my care team before I take this medication? They need to know if you have any of these conditions: Blood disorders Infection Kidney disease Liver disease Lung or breathing disease, such as asthma or COPD Recent or ongoing radiation therapy An unusual or allergic reaction to gemcitabine, other medications, foods, dyes, or preservatives If you or your partner are pregnant or trying to get pregnant Breast-feeding How should I use this medication? This medication is injected into a vein. It is given by your care team in a hospital or clinic setting. Talk to your care team about the use of this medication in children. Special care may be needed. Overdosage: If you think you have taken too much of this medicine contact a poison control center or emergency room at once. NOTE: This medicine is only for you. Do not share this medicine with others. What if I miss a dose? Keep appointments for follow-up doses. It is important not to miss your dose. Call your care team if you are unable to keep an appointment. What may interact with this medication? Interactions have not been studied. This list may not  describe all possible interactions. Give your health care provider a list of all the medicines, herbs, non-prescription drugs, or dietary supplements you use. Also tell them if you smoke, drink alcohol, or use illegal drugs. Some items may interact with your medicine. What should I watch for while using this medication? Your condition will be monitored carefully while you are receiving this medication. This medication may make you feel generally unwell. This is not uncommon, as chemotherapy can affect healthy cells as well as cancer cells. Report any side effects. Continue your course of treatment even though you feel ill unless your care team tells you to stop. In some cases, you may be given additional medications to help with side effects. Follow all directions for their use. This medication may increase your risk of getting an infection. Call your care team for advice if you get a fever, chills, sore throat, or other symptoms of a cold or flu. Do not treat yourself. Try to avoid being around people who   are sick. This medication may increase your risk to bruise or bleed. Call your care team if you notice any unusual bleeding. Be careful brushing or flossing your teeth or using a toothpick because you may get an infection or bleed more easily. If you have any dental work done, tell your dentist you are receiving this medication. Avoid taking medications that contain aspirin, acetaminophen, ibuprofen, naproxen, or ketoprofen unless instructed by your care team. These medications may hide a fever. Talk to your care team if you or your partner wish to become pregnant or think you might be pregnant. This medication can cause serious birth defects if taken during pregnancy and for 6 months after the last dose. A negative pregnancy test is required before starting this medication. A reliable form of contraception is recommended while taking this medication and for 6 months after the last dose. Talk to your care  team about effective forms of contraception. Do not father a child while taking this medication and for 3 months after the last dose. Use a condom while having sex during this time period. Do not breastfeed while taking this medication and for at least 1 week after the last dose. This medication may cause infertility. Talk to your care team if you are concerned about your fertility. What side effects may I notice from receiving this medication? Side effects that you should report to your care team as soon as possible: Allergic reactions--skin rash, itching, hives, swelling of the face, lips, tongue, or throat Capillary leak syndrome--stomach or muscle pain, unusual weakness or fatigue, feeling faint or lightheaded, decrease in the amount of urine, swelling of the ankles, hands, or feet, trouble breathing Infection--fever, chills, cough, sore throat, wounds that don't heal, pain or trouble when passing urine, general feeling of discomfort or being unwell Liver injury--right upper belly pain, loss of appetite, nausea, light-colored stool, dark yellow or brown urine, yellowing skin or eyes, unusual weakness or fatigue Low red blood cell level--unusual weakness or fatigue, dizziness, headache, trouble breathing Lung injury--shortness of breath or trouble breathing, cough, spitting up blood, chest pain, fever Stomach pain, bloody diarrhea, pale skin, unusual weakness or fatigue, decrease in the amount of urine, which may be signs of hemolytic uremic syndrome Sudden and severe headache, confusion, change in vision, seizures, which may be signs of posterior reversible encephalopathy syndrome (PRES) Unusual bruising or bleeding Side effects that usually do not require medical attention (report to your care team if they continue or are bothersome): Diarrhea Drowsiness Hair loss Nausea Pain, redness, or swelling with sores inside the mouth or throat Vomiting This list may not describe all possible side  effects. Call your doctor for medical advice about side effects. You may report side effects to FDA at 1-800-FDA-1088. Where should I keep my medication? This medication is given in a hospital or clinic. It will not be stored at home. NOTE: This sheet is a summary. It may not cover all possible information. If you have questions about this medicine, talk to your doctor, pharmacist, or health care provider.  2024 Elsevier/Gold Standard (2022-04-23 00:00:00)  

## 2023-09-02 NOTE — Progress Notes (Signed)
Patient seen by Lisa Thomas NP today  Vitals are within treatment parameters.  Labs reviewed by Lisa Thomas NP and are within treatment parameters.  Per physician team, patient is ready for treatment and there are NO modifications to the treatment plan.     

## 2023-09-02 NOTE — Progress Notes (Signed)
  [facility] OFFICE PROGRESS NOTE   Diagnosis: Pancreas cancer  INTERVAL HISTORY:   Wendy [name] returns as scheduled.  She completed cycle 2 gemcitabine/Abraxane 08/19/2023.  She denies nausea/vomiting.  No mouth sores.  No diarrhea.  No fever or rash after treatment.  No neuropathy symptoms.  She has intermittent pain at the right upper abdomen.  Objective:  Vital signs in last 24 hours:  Blood pressure (!) 142/76, pulse 90, temperature 97.9 F (36.6 C), temperature source Oral, resp. rate 20, height 5\' 6"  (1.676 m), weight 121 lb 9.6 oz (55.2 kg), SpO2 100%.    HEENT: No thrush or ulcers. Resp: Lungs clear bilaterally. Cardio: Irregular. GI: Tender right upper abdomen.  No hepatosplenomegaly.  No mass. Vascular: No leg edema. Skin: No rash.   Lab Results:  Lab Results  Component Value Date   WBC 4.4 09/02/2023   HGB 10.5 (L) 09/02/2023   HCT 32.8 (L) 09/02/2023   MCV 93.4 09/02/2023   PLT 240 09/02/2023   NEUTROABS 2.7 09/02/2023    Imaging:  No results found.  Medications: I have reviewed the patient's current medications.  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  Fluid-filled and distended stomach and proximal duodenum, single 11 mm periportal lymph node EGD 07/08/2023-large food residue in the stomach, extrinsic deformity at the first portion of the duodenum-biopsied Elevated CA 19-9 CT chest 07/11/2023-scattered small pulmonary nodules-indeterminate Cycle 1 gemcitabine/Abraxane 08/05/2023 Cycle 2 gemcitabine/Abraxane 08/19/2023 Cycle 3 gemcitabine/Abraxane 09/02/2023 2.   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   Anemia 4.   Iron deficiency 5.   B12 deficiency 6.   Atrial fibrillation-maintained on Xarelto prior to hospital admission 7.  Family history of pancreas and uterine cancer 8.  Hyperlipidemia  Disposition: Wendy [name] appears stable.  She has completed 2 cycles of gemcitabine/Abraxane.  She is tolerating treatment well.  Plan to proceed with cycle 3 today as scheduled.  CBC and chemistry panel reviewed.  Labs adequate to proceed as above.  She will return for follow-up and treatment in 2 weeks.    Lonna Cobb ANP/GNP-BC   09/02/2023  10:46 AM

## 2023-09-03 ENCOUNTER — Other Ambulatory Visit: Payer: Self-pay

## 2023-09-03 LAB — CANCER ANTIGEN 19-9: CA 19-9: 227 U/mL — ABNORMAL HIGH (ref 0–35)

## 2023-09-13 ENCOUNTER — Other Ambulatory Visit: Payer: Self-pay | Admitting: Oncology

## 2023-09-16 ENCOUNTER — Inpatient Hospital Stay: Payer: Medicare Other

## 2023-09-16 ENCOUNTER — Inpatient Hospital Stay (HOSPITAL_BASED_OUTPATIENT_CLINIC_OR_DEPARTMENT_OTHER): Payer: Medicare Other | Admitting: Oncology

## 2023-09-16 ENCOUNTER — Encounter: Payer: Self-pay | Admitting: *Deleted

## 2023-09-16 VITALS — BP 140/73 | HR 69 | Resp 18

## 2023-09-16 DIAGNOSIS — C250 Malignant neoplasm of head of pancreas: Secondary | ICD-10-CM | POA: Diagnosis not present

## 2023-09-16 DIAGNOSIS — Z5111 Encounter for antineoplastic chemotherapy: Secondary | ICD-10-CM | POA: Diagnosis not present

## 2023-09-16 LAB — CMP (CANCER CENTER ONLY)
ALT: 14 U/L (ref 0–44)
AST: 18 U/L (ref 15–41)
Albumin: 3.9 g/dL (ref 3.5–5.0)
Alkaline Phosphatase: 82 U/L (ref 38–126)
Anion gap: 5 (ref 5–15)
BUN: 27 mg/dL — ABNORMAL HIGH (ref 8–23)
CO2: 26 mmol/L (ref 22–32)
Calcium: 9.4 mg/dL (ref 8.9–10.3)
Chloride: 105 mmol/L (ref 98–111)
Creatinine: 0.73 mg/dL (ref 0.44–1.00)
GFR, Estimated: >60 mL/min (ref >60)
Glucose, Bld: 112 mg/dL — ABNORMAL HIGH (ref 70–99)
Potassium: 4.3 mmol/L (ref 3.5–5.1)
Sodium: 136 mmol/L (ref 135–145)
Total Bilirubin: 0.3 mg/dL (ref 0.3–1.2)
Total Protein: 6.3 g/dL — ABNORMAL LOW (ref 6.5–8.1)

## 2023-09-16 LAB — CBC WITH DIFFERENTIAL (CANCER CENTER ONLY)
Abs Immature Granulocytes: 0.01 10*3/uL (ref 0.00–0.07)
Basophils Absolute: 0 10*3/uL (ref 0.0–0.1)
Basophils Relative: 1 %
Eosinophils Absolute: 0.3 10*3/uL (ref 0.0–0.5)
Eosinophils Relative: 6 %
HCT: 33 % — ABNORMAL LOW (ref 36.0–46.0)
Hemoglobin: 10.8 g/dL — ABNORMAL LOW (ref 12.0–15.0)
Immature Granulocytes: 0 %
Lymphocytes Relative: 22 %
Lymphs Abs: 1 10*3/uL (ref 0.7–4.0)
MCH: 29.9 pg (ref 26.0–34.0)
MCHC: 32.7 g/dL (ref 30.0–36.0)
MCV: 91.4 fL (ref 80.0–100.0)
Monocytes Absolute: 0.6 10*3/uL (ref 0.1–1.0)
Monocytes Relative: 12 %
Neutro Abs: 2.7 10*3/uL (ref 1.7–7.7)
Neutrophils Relative %: 59 %
Platelet Count: 268 10*3/uL (ref 150–400)
RBC: 3.61 MIL/uL — ABNORMAL LOW (ref 3.87–5.11)
RDW: 14.3 % (ref 11.5–15.5)
WBC Count: 4.6 10*3/uL (ref 4.0–10.5)
nRBC: 0 % (ref 0.0–0.2)

## 2023-09-16 MED ORDER — PROCHLORPERAZINE MALEATE 10 MG PO TABS
10.0000 mg | ORAL_TABLET | Freq: Once | ORAL | Status: AC
  Administered 2023-09-16: 10 mg via ORAL
  Filled 2023-09-16: qty 1

## 2023-09-16 MED ORDER — SODIUM CHLORIDE 0.9 % IV SOLN: Freq: Once | INTRAVENOUS | Status: AC

## 2023-09-16 MED ORDER — PACLITAXEL PROTEIN-BOUND CHEMO INJECTION 100 MG
100.0000 mg/m2 | Freq: Once | INTRAVENOUS | Status: AC
  Administered 2023-09-16: 150 mg via INTRAVENOUS
  Filled 2023-09-16: qty 30

## 2023-09-16 MED ORDER — SODIUM CHLORIDE 0.9% FLUSH
10.0000 mL | INTRAVENOUS | Status: DC | PRN
  Administered 2023-09-16: 10 mL

## 2023-09-16 MED ORDER — HEPARIN SOD (PORK) LOCK FLUSH 100 UNIT/ML IV SOLN
500.0000 [IU] | Freq: Once | INTRAVENOUS | Status: AC | PRN
  Administered 2023-09-16: 500 [IU]

## 2023-09-16 MED ORDER — SODIUM CHLORIDE 0.9 % IV SOLN
1000.0000 mg/m2 | Freq: Once | INTRAVENOUS | Status: AC
  Administered 2023-09-16: 1634 mg via INTRAVENOUS
  Filled 2023-09-16: qty 26.28

## 2023-09-16 NOTE — Progress Notes (Signed)
  [facility] OFFICE PROGRESS NOTE   Diagnosis: Pancreas cancer  INTERVAL HISTORY:   Wendy [name] returns as scheduled.  She completed another cycle of gemcitabine/Abraxane on 09/02/2023.  No rash, fever, or neuropathy symptoms.  No nausea or vomiting.  She is tolerating a diet.  She walked 3 miles yesterday.  Mild abdominal and back discomfort.  She takes Tylenol in the evening.  Objective:  Vital signs in last 24 hours:  Blood pressure (!) 146/72, pulse 76, temperature 98.1 F (36.7 C), temperature source Oral, resp. rate 18, height 5\' 6"  (1.676 m), weight 120 lb 3.2 oz (54.5 kg), SpO2 100%.    HEENT: No thrush or ulcers Resp: Lungs clear bilaterally Cardio: Irregular GI: No mass, no hepatosplenomegaly, nontender Vascular: No leg edema    Portacath/PICC-without erythema  Lab Results:  Lab Results  Component Value Date   WBC 4.6 09/16/2023   HGB 10.8 (L) 09/16/2023   HCT 33.0 (L) 09/16/2023   MCV 91.4 09/16/2023   PLT 268 09/16/2023   NEUTROABS 2.7 09/16/2023    CMP  Lab Results  Component Value Date   NA 136 09/16/2023   K 4.3 09/16/2023   CL 105 09/16/2023   CO2 26 09/16/2023   GLUCOSE 112 (H) 09/16/2023   BUN 27 (H) 09/16/2023   CREATININE 0.73 09/16/2023   CALCIUM 9.4 09/16/2023   PROT 6.3 (L) 09/16/2023   ALBUMIN 3.9 09/16/2023   AST 18 09/16/2023   ALT 14 09/16/2023   ALKPHOS 82 09/16/2023   BILITOT 0.3 09/16/2023   GFRNONAA >60 09/16/2023    Lab Results  Component Value Date   JXB147 227 (H) 09/02/2023    No results found for: "INR", "LABPROT"  Imaging:  No results found.  Medications: I have reviewed the patient's current medications.   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  Fluid-filled and distended stomach and proximal duodenum, single 11 mm periportal lymph node EGD 07/08/2023-large food residue in the stomach, extrinsic deformity at the first portion of the duodenum-biopsied Elevated CA 19-9 CT chest 07/11/2023-scattered small pulmonary nodules-indeterminate Cycle 1 gemcitabine/Abraxane 08/05/2023 Cycle 2 gemcitabine/Abraxane 08/19/2023 Cycle 3 gemcitabine/Abraxane 09/02/2023 Cycle 4 gemcitabine/Abraxane 09/16/2023 2.   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   Anemia 4.   Iron deficiency 5.   B12 deficiency 6.   Atrial fibrillation-maintained on Xarelto prior to hospital admission 7.  Family history of pancreas and uterine cancer 8.  Hyperlipidemia   Disposition: [name] appears stable.  She is tolerating the gemcitabine/Abraxane well.  She will complete cycle 4 today.  The CA 19-9 was lower 09/02/2023.  [name] will return for an office visit and chemotherapy in 2 weeks.  She will undergo restaging CT evaluation after cycle 5.  [name], MD  09/16/2023  9:27 AM

## 2023-09-16 NOTE — Progress Notes (Signed)
Patient seen by Dr. [name] today  Vitals are within treatment parameters.   Labs reviewed by Dr. Truett Perna and are within treatment parameters.  Per physician team, patient is ready for treatment and there are NO modifications to the treatment plan.

## 2023-09-16 NOTE — Patient Instructions (Signed)
[facility]   Discharge Instructions: Thank you for choosing [facility] to provide your oncology and hematology care.   If you have a lab appointment with the Cancer Center, please go directly to the Cancer Center and check in at the registration area.   Wear comfortable clothing and clothing appropriate for easy access to any Portacath or PICC line.   We strive to give you quality time with your provider. You may need to reschedule your appointment if you arrive late (15 or more minutes).  Arriving late affects you and other patients whose appointments are after yours.  Also, if you miss three or more appointments without notifying the office, you may be dismissed from the clinic at the provider's discretion.      For prescription refill requests, have your pharmacy contact our office and allow 72 hours for refills to be completed.    Today you received the following chemotherapy and/or immunotherapy agents Paclitaxel-protein bound (ABRAXANE) & Gemcitabine (GEMZAR).      To help prevent nausea and vomiting after your treatment, we encourage you to take your nausea medication as directed.  BELOW ARE SYMPTOMS THAT SHOULD BE REPORTED IMMEDIATELY: *FEVER GREATER THAN 100.4 F (38 C) OR HIGHER *CHILLS OR SWEATING *NAUSEA AND VOMITING THAT IS NOT CONTROLLED WITH YOUR NAUSEA MEDICATION *UNUSUAL SHORTNESS OF BREATH *UNUSUAL BRUISING OR BLEEDING *URINARY PROBLEMS (pain or burning when urinating, or frequent urination) *BOWEL PROBLEMS (unusual diarrhea, constipation, pain near the anus) TENDERNESS IN MOUTH AND THROAT WITH OR WITHOUT PRESENCE OF ULCERS (sore throat, sores in mouth, or a toothache) UNUSUAL RASH, SWELLING OR PAIN  UNUSUAL VAGINAL DISCHARGE OR ITCHING   Items with * indicate a potential emergency and should be followed up as soon as possible or go to the Emergency Department if any problems should occur.  Please show the  CHEMOTHERAPY ALERT CARD or IMMUNOTHERAPY ALERT CARD at check-in to the Emergency Department and triage nurse.  Should you have questions after your visit or need to cancel or reschedule your appointment, please contact [facility]  Dept: [phone]  and follow the prompts.  Office hours are 8:00 a.m. to 4:30 p.m. Monday - Friday. Please note that voicemails left after 4:00 p.m. may not be returned until the following business day.  We are closed weekends and major holidays. You have access to a nurse at all times for urgent questions. Please call the main number to the clinic Dept: [phone] and follow the prompts.   For any non-urgent questions, you may also contact your provider using MyChart. We now offer e-Visits for anyone 34 and older to request care online for non-urgent symptoms. For details visit [email or web address].   Also download the MyChart app! Go to the app store, search "MyChart", open the app, select [facility], and log in with your MyChart username and password.  Paclitaxel Nanoparticle Albumin-Bound Injection What is this medication? NANOPARTICLE ALBUMIN-BOUND PACLITAXEL (Na no PAHR ti kuhl al BYOO muhn-bound PAK li TAX el) treats some types of cancer. It works by slowing down the growth of cancer cells. This medicine may be used for other purposes; ask your health care provider or pharmacist if you have questions. COMMON BRAND NAME(S): Abraxane What should I tell my care team before I take this medication? They need to know if you have any of these conditions: Liver disease Low white blood cell levels An unusual or allergic reaction to paclitaxel, albumin, other medications, foods,  dyes, or preservatives If you or your partner are pregnant or trying to get pregnant Breast-feeding How should I use this medication? This medication is injected into a vein. It is given by your care team in a hospital or clinic setting. Talk to your  care team about the use of this medication in children. Special care may be needed. Overdosage: If you think you have taken too much of this medicine contact a poison control center or emergency room at once. NOTE: This medicine is only for you. Do not share this medicine with others. What if I miss a dose? Keep appointments for follow-up doses. It is important not to miss your dose. Call your care team if you are unable to keep an appointment. What may interact with this medication? Other medications may affect the way this medication works. Talk with your care team about all of the medications you take. They may suggest changes to your treatment plan to lower the risk of side effects and to make sure your medications work as intended. This list may not describe all possible interactions. Give your health care provider a list of all the medicines, herbs, non-prescription drugs, or dietary supplements you use. Also tell them if you smoke, drink alcohol, or use illegal drugs. Some items may interact with your medicine. What should I watch for while using this medication? Your condition will be monitored carefully while you are receiving this medication. You may need blood work while taking this medication. This medication may make you feel generally unwell. This is not uncommon as chemotherapy can affect healthy cells as well as cancer cells. Report any side effects. Continue your course of treatment even though you feel ill unless your care team tells you to stop. This medication can cause serious allergic reactions. To reduce the risk, your care team may give you other medications to take before receiving this one. Be sure to follow the directions from your care team. This medication may increase your risk of getting an infection. Call your care team for advice if you get a fever, chills, sore throat, or other symptoms of a cold or flu. Do not treat yourself. Try to avoid being around people who are  sick. This medication may increase your risk to bruise or bleed. Call your care team if you notice any unusual bleeding. Be careful brushing or flossing your teeth or using a toothpick because you may get an infection or bleed more easily. If you have any dental work done, tell your dentist you are receiving this medication. Talk to your care team if you or your partner may be pregnant. Serious birth defects can occur if you take this medication during pregnancy and for 6 months after the last dose. You will need a negative pregnancy test before starting this medication. Contraception is recommended while taking this medication and for 6 months after the last dose. Your care team can help you find the option that works for you. If your partner can get pregnant, use a condom during sex while taking this medication and for 3 months after the last dose. Do not breastfeed while taking this medication and for 2 weeks after the last dose. This medication may cause infertility. Talk to your care team if you are concerned about your fertility. What side effects may I notice from receiving this medication? Side effects that you should report to your care team as soon as possible: Allergic reactions--skin rash, itching, hives, swelling of the face, lips, tongue, or  throat Dry cough, shortness of breath or trouble breathing Infection--fever, chills, cough, sore throat, wounds that don't heal, pain or trouble when passing urine, general feeling of discomfort or being unwell Low red blood cell level--unusual weakness or fatigue, dizziness, headache, trouble breathing Pain, tingling, or numbness in the hands or feet Stomach pain, unusual weakness or fatigue, nausea, vomiting, diarrhea, or fever that lasts longer than expected Unusual bruising or bleeding Side effects that usually do not require medical attention (report to your care team if they continue or are bothersome): Diarrhea Fatigue Hair loss Loss of  appetite Nausea Vomiting This list may not describe all possible side effects. Call your doctor for medical advice about side effects. You may report side effects to FDA at 1-800-FDA-1088. Where should I keep my medication? This medication is given in a hospital or clinic. It will not be stored at home. NOTE: This sheet is a summary. It may not cover all possible information. If you have questions about this medicine, talk to your doctor, pharmacist, or health care provider.  2024 Elsevier/Gold Standard (2022-05-02 00:00:00)  Gemcitabine Injection What is this medication? GEMCITABINE (jem SYE ta been) treats some types of cancer. It works by slowing down the growth of cancer cells. This medicine may be used for other purposes; ask your health care provider or pharmacist if you have questions. COMMON BRAND NAME(S): Gemzar, Infugem What should I tell my care team before I take this medication? They need to know if you have any of these conditions: Blood disorders Infection Kidney disease Liver disease Lung or breathing disease, such as asthma or COPD Recent or ongoing radiation therapy An unusual or allergic reaction to gemcitabine, other medications, foods, dyes, or preservatives If you or your partner are pregnant or trying to get pregnant Breast-feeding How should I use this medication? This medication is injected into a vein. It is given by your care team in a hospital or clinic setting. Talk to your care team about the use of this medication in children. Special care may be needed. Overdosage: If you think you have taken too much of this medicine contact a poison control center or emergency room at once. NOTE: This medicine is only for you. Do not share this medicine with others. What if I miss a dose? Keep appointments for follow-up doses. It is important not to miss your dose. Call your care team if you are unable to keep an appointment. What may interact with this  medication? Interactions have not been studied. This list may not describe all possible interactions. Give your health care provider a list of all the medicines, herbs, non-prescription drugs, or dietary supplements you use. Also tell them if you smoke, drink alcohol, or use illegal drugs. Some items may interact with your medicine. What should I watch for while using this medication? Your condition will be monitored carefully while you are receiving this medication. This medication may make you feel generally unwell. This is not uncommon, as chemotherapy can affect healthy cells as well as cancer cells. Report any side effects. Continue your course of treatment even though you feel ill unless your care team tells you to stop. In some cases, you may be given additional medications to help with side effects. Follow all directions for their use. This medication may increase your risk of getting an infection. Call your care team for advice if you get a fever, chills, sore throat, or other symptoms of a cold or flu. Do not treat yourself. Try to  avoid being around people who are sick. This medication may increase your risk to bruise or bleed. Call your care team if you notice any unusual bleeding. Be careful brushing or flossing your teeth or using a toothpick because you may get an infection or bleed more easily. If you have any dental work done, tell your dentist you are receiving this medication. Avoid taking medications that contain aspirin, acetaminophen, ibuprofen, naproxen, or ketoprofen unless instructed by your care team. These medications may hide a fever. Talk to your care team if you or your partner wish to become pregnant or think you might be pregnant. This medication can cause serious birth defects if taken during pregnancy and for 6 months after the last dose. A negative pregnancy test is required before starting this medication. A reliable form of contraception is recommended while taking  this medication and for 6 months after the last dose. Talk to your care team about effective forms of contraception. Do not father a child while taking this medication and for 3 months after the last dose. Use a condom while having sex during this time period. Do not breastfeed while taking this medication and for at least 1 week after the last dose. This medication may cause infertility. Talk to your care team if you are concerned about your fertility. What side effects may I notice from receiving this medication? Side effects that you should report to your care team as soon as possible: Allergic reactions--skin rash, itching, hives, swelling of the face, lips, tongue, or throat Capillary leak syndrome--stomach or muscle pain, unusual weakness or fatigue, feeling faint or lightheaded, decrease in the amount of urine, swelling of the ankles, hands, or feet, trouble breathing Infection--fever, chills, cough, sore throat, wounds that don't heal, pain or trouble when passing urine, general feeling of discomfort or being unwell Liver injury--right upper belly pain, loss of appetite, nausea, light-colored stool, dark yellow or brown urine, yellowing skin or eyes, unusual weakness or fatigue Low red blood cell level--unusual weakness or fatigue, dizziness, headache, trouble breathing Lung injury--shortness of breath or trouble breathing, cough, spitting up blood, chest pain, fever Stomach pain, bloody diarrhea, pale skin, unusual weakness or fatigue, decrease in the amount of urine, which may be signs of hemolytic uremic syndrome Sudden and severe headache, confusion, change in vision, seizures, which may be signs of posterior reversible encephalopathy syndrome (PRES) Unusual bruising or bleeding Side effects that usually do not require medical attention (report to your care team if they continue or are bothersome): Diarrhea Drowsiness Hair loss Nausea Pain, redness, or swelling with sores inside the  mouth or throat Vomiting This list may not describe all possible side effects. Call your doctor for medical advice about side effects. You may report side effects to FDA at 1-800-FDA-1088. Where should I keep my medication? This medication is given in a hospital or clinic. It will not be stored at home. NOTE: This sheet is a summary. It may not cover all possible information. If you have questions about this medicine, talk to your doctor, pharmacist, or health care provider.  2024 Elsevier/Gold Standard (2022-04-23 00:00:00)

## 2023-09-18 ENCOUNTER — Telehealth: Payer: Self-pay | Admitting: *Deleted

## 2023-09-18 NOTE — Telephone Encounter (Signed)
Daughter notified of CT appointment w/port access on 10/09/23. NPO 4 hours prior. Will not need oral contrast per MD>

## 2023-09-23 DIAGNOSIS — Z23 Encounter for immunization: Secondary | ICD-10-CM | POA: Diagnosis not present

## 2023-09-25 ENCOUNTER — Encounter: Payer: Self-pay | Admitting: Family Medicine

## 2023-09-30 ENCOUNTER — Inpatient Hospital Stay: Payer: Medicare Other

## 2023-09-30 ENCOUNTER — Inpatient Hospital Stay (HOSPITAL_BASED_OUTPATIENT_CLINIC_OR_DEPARTMENT_OTHER): Payer: Medicare Other | Admitting: Nurse Practitioner

## 2023-09-30 ENCOUNTER — Encounter: Payer: Self-pay | Admitting: Nurse Practitioner

## 2023-09-30 ENCOUNTER — Other Ambulatory Visit: Payer: Self-pay

## 2023-09-30 ENCOUNTER — Inpatient Hospital Stay: Payer: Medicare Other | Attending: Oncology

## 2023-09-30 VITALS — BP 158/78 | HR 65

## 2023-09-30 VITALS — BP 139/65 | HR 76 | Temp 97.6°F | Resp 18 | Ht 66.0 in | Wt 120.1 lb

## 2023-09-30 DIAGNOSIS — Z5111 Encounter for antineoplastic chemotherapy: Secondary | ICD-10-CM | POA: Insufficient documentation

## 2023-09-30 DIAGNOSIS — C250 Malignant neoplasm of head of pancreas: Secondary | ICD-10-CM

## 2023-09-30 LAB — CMP (CANCER CENTER ONLY)
ALT: 19 U/L (ref 0–44)
AST: 21 U/L (ref 15–41)
Albumin: 3.9 g/dL (ref 3.5–5.0)
Alkaline Phosphatase: 75 U/L (ref 38–126)
Anion gap: 7 (ref 5–15)
BUN: 28 mg/dL — ABNORMAL HIGH (ref 8–23)
CO2: 25 mmol/L (ref 22–32)
Calcium: 9.6 mg/dL (ref 8.9–10.3)
Chloride: 107 mmol/L (ref 98–111)
Creatinine: 0.63 mg/dL (ref 0.44–1.00)
GFR, Estimated: >60 mL/min (ref >60)
Glucose, Bld: 143 mg/dL — ABNORMAL HIGH (ref 70–99)
Potassium: 4 mmol/L (ref 3.5–5.1)
Sodium: 139 mmol/L (ref 135–145)
Total Bilirubin: 0.4 mg/dL (ref 0.3–1.2)
Total Protein: 6.2 g/dL — ABNORMAL LOW (ref 6.5–8.1)

## 2023-09-30 LAB — CBC WITH DIFFERENTIAL (CANCER CENTER ONLY)
Abs Immature Granulocytes: 0.01 10*3/uL (ref 0.00–0.07)
Basophils Absolute: 0 10*3/uL (ref 0.0–0.1)
Basophils Relative: 1 %
Eosinophils Absolute: 0.6 10*3/uL — ABNORMAL HIGH (ref 0.0–0.5)
Eosinophils Relative: 13 %
HCT: 33.2 % — ABNORMAL LOW (ref 36.0–46.0)
Hemoglobin: 10.8 g/dL — ABNORMAL LOW (ref 12.0–15.0)
Immature Granulocytes: 0 %
Lymphocytes Relative: 18 %
Lymphs Abs: 0.9 10*3/uL (ref 0.7–4.0)
MCH: 29.4 pg (ref 26.0–34.0)
MCHC: 32.5 g/dL (ref 30.0–36.0)
MCV: 90.5 fL (ref 80.0–100.0)
Monocytes Absolute: 0.5 10*3/uL (ref 0.1–1.0)
Monocytes Relative: 10 %
Neutro Abs: 2.8 10*3/uL (ref 1.7–7.7)
Neutrophils Relative %: 58 %
Platelet Count: 236 10*3/uL (ref 150–400)
RBC: 3.67 MIL/uL — ABNORMAL LOW (ref 3.87–5.11)
RDW: 14.5 % (ref 11.5–15.5)
WBC Count: 4.8 10*3/uL (ref 4.0–10.5)
nRBC: 0 % (ref 0.0–0.2)

## 2023-09-30 LAB — FERRITIN: Ferritin: 24 ng/mL (ref 11–307)

## 2023-09-30 MED ORDER — SODIUM CHLORIDE 0.9% FLUSH
10.0000 mL | INTRAVENOUS | Status: DC | PRN
  Administered 2023-09-30: 10 mL

## 2023-09-30 MED ORDER — PACLITAXEL PROTEIN-BOUND CHEMO INJECTION 100 MG
100.0000 mg/m2 | Freq: Once | INTRAVENOUS | Status: AC
  Administered 2023-09-30: 150 mg via INTRAVENOUS
  Filled 2023-09-30: qty 30

## 2023-09-30 MED ORDER — PROCHLORPERAZINE MALEATE 10 MG PO TABS
10.0000 mg | ORAL_TABLET | Freq: Once | ORAL | Status: AC
  Administered 2023-09-30: 10 mg via ORAL
  Filled 2023-09-30: qty 1

## 2023-09-30 MED ORDER — HEPARIN SOD (PORK) LOCK FLUSH 100 UNIT/ML IV SOLN
500.0000 [IU] | Freq: Once | INTRAVENOUS | Status: AC | PRN
  Administered 2023-09-30: 500 [IU]

## 2023-09-30 MED ORDER — SODIUM CHLORIDE 0.9 % IV SOLN: Freq: Once | INTRAVENOUS | Status: AC

## 2023-09-30 MED ORDER — SODIUM CHLORIDE 0.9 % IV SOLN
1000.0000 mg/m2 | Freq: Once | INTRAVENOUS | Status: AC
  Administered 2023-09-30: 1634 mg via INTRAVENOUS
  Filled 2023-09-30: qty 26.28

## 2023-09-30 NOTE — Progress Notes (Signed)
Patient seen by Lonna Cobb NP today  Vitals are within treatment parameters:Yes   Labs are within treatment parameters: Yes   Treatment plan has been signed: Yes   Per physician team, Patient is ready for treatment and there are NO modifications to the treatment plan.

## 2023-09-30 NOTE — Patient Instructions (Signed)
[facility]   Discharge Instructions: Thank you for choosing [facility] to provide your oncology and hematology care.   If you have a lab appointment with the Cancer Center, please go directly to the Cancer Center and check in at the registration area.   Wear comfortable clothing and clothing appropriate for easy access to any Portacath or PICC line.   We strive to give you quality time with your provider. You may need to reschedule your appointment if you arrive late (15 or more minutes).  Arriving late affects you and other patients whose appointments are after yours.  Also, if you miss three or more appointments without notifying the office, you may be dismissed from the clinic at the provider's discretion.      For prescription refill requests, have your pharmacy contact our office and allow 72 hours for refills to be completed.    Today you received the following chemotherapy and/or immunotherapy agents Abraxane/Gemzar      To help prevent nausea and vomiting after your treatment, we encourage you to take your nausea medication as directed.  BELOW ARE SYMPTOMS THAT SHOULD BE REPORTED IMMEDIATELY: *FEVER GREATER THAN 100.4 F (38 C) OR HIGHER *CHILLS OR SWEATING *NAUSEA AND VOMITING THAT IS NOT CONTROLLED WITH YOUR NAUSEA MEDICATION *UNUSUAL SHORTNESS OF BREATH *UNUSUAL BRUISING OR BLEEDING *URINARY PROBLEMS (pain or burning when urinating, or frequent urination) *BOWEL PROBLEMS (unusual diarrhea, constipation, pain near the anus) TENDERNESS IN MOUTH AND THROAT WITH OR WITHOUT PRESENCE OF ULCERS (sore throat, sores in mouth, or a toothache) UNUSUAL RASH, SWELLING OR PAIN  UNUSUAL VAGINAL DISCHARGE OR ITCHING   Items with * indicate a potential emergency and should be followed up as soon as possible or go to the Emergency Department if any problems should occur.  Please show the CHEMOTHERAPY ALERT CARD or IMMUNOTHERAPY ALERT CARD at  check-in to the Emergency Department and triage nurse.  Should you have questions after your visit or need to cancel or reschedule your appointment, please contact [facility]  Dept: [phone]  and follow the prompts.  Office hours are 8:00 a.m. to 4:30 p.m. Monday - Friday. Please note that voicemails left after 4:00 p.m. may not be returned until the following business day.  We are closed weekends and major holidays. You have access to a nurse at all times for urgent questions. Please call the main number to the clinic Dept: [phone] and follow the prompts.   For any non-urgent questions, you may also contact your provider using MyChart. We now offer e-Visits for anyone 18 and older to request care online for non-urgent symptoms. For details visit [email or web address].   Also download the MyChart app! Go to the app store, search "MyChart", open the app, select [facility], and log in with your MyChart username and password.  Paclitaxel Nanoparticle Albumin-Bound Injection What is this medication? NANOPARTICLE ALBUMIN-BOUND PACLITAXEL (Na no PAHR ti kuhl al BYOO muhn-bound PAK li TAX el) treats some types of cancer. It works by slowing down the growth of cancer cells. This medicine may be used for other purposes; ask your health care provider or pharmacist if you have questions. COMMON BRAND NAME(S): Abraxane What should I tell my care team before I take this medication? They need to know if you have any of these conditions: Liver disease Low white blood cell levels An unusual or allergic reaction to paclitaxel, albumin, other medications, foods, dyes, or preservatives If you   or your partner are pregnant or trying to get pregnant Breast-feeding How should I use this medication? This medication is injected into a vein. It is given by your care team in a hospital or clinic setting. Talk to your care team about the use of this medication in children.  Special care may be needed. Overdosage: If you think you have taken too much of this medicine contact a poison control center or emergency room at once. NOTE: This medicine is only for you. Do not share this medicine with others. What if I miss a dose? Keep appointments for follow-up doses. It is important not to miss your dose. Call your care team if you are unable to keep an appointment. What may interact with this medication? Other medications may affect the way this medication works. Talk with your care team about all of the medications you take. They may suggest changes to your treatment plan to lower the risk of side effects and to make sure your medications work as intended. This list may not describe all possible interactions. Give your health care provider a list of all the medicines, herbs, non-prescription drugs, or dietary supplements you use. Also tell them if you smoke, drink alcohol, or use illegal drugs. Some items may interact with your medicine. What should I watch for while using this medication? Your condition will be monitored carefully while you are receiving this medication. You may need blood work while taking this medication. This medication may make you feel generally unwell. This is not uncommon as chemotherapy can affect healthy cells as well as cancer cells. Report any side effects. Continue your course of treatment even though you feel ill unless your care team tells you to stop. This medication can cause serious allergic reactions. To reduce the risk, your care team may give you other medications to take before receiving this one. Be sure to follow the directions from your care team. This medication may increase your risk of getting an infection. Call your care team for advice if you get a fever, chills, sore throat, or other symptoms of a cold or flu. Do not treat yourself. Try to avoid being around people who are sick. This medication may increase your risk to bruise or  bleed. Call your care team if you notice any unusual bleeding. Be careful brushing or flossing your teeth or using a toothpick because you may get an infection or bleed more easily. If you have any dental work done, tell your dentist you are receiving this medication. Talk to your care team if you or your partner may be pregnant. Serious birth defects can occur if you take this medication during pregnancy and for 6 months after the last dose. You will need a negative pregnancy test before starting this medication. Contraception is recommended while taking this medication and for 6 months after the last dose. Your care team can help you find the option that works for you. If your partner can get pregnant, use a condom during sex while taking this medication and for 3 months after the last dose. Do not breastfeed while taking this medication and for 2 weeks after the last dose. This medication may cause infertility. Talk to your care team if you are concerned about your fertility. What side effects may I notice from receiving this medication? Side effects that you should report to your care team as soon as possible: Allergic reactions--skin rash, itching, hives, swelling of the face, lips, tongue, or throat Dry cough, shortness of   breath or trouble breathing Infection--fever, chills, cough, sore throat, wounds that don't heal, pain or trouble when passing urine, general feeling of discomfort or being unwell Low red blood cell level--unusual weakness or fatigue, dizziness, headache, trouble breathing Pain, tingling, or numbness in the hands or feet Stomach pain, unusual weakness or fatigue, nausea, vomiting, diarrhea, or fever that lasts longer than expected Unusual bruising or bleeding Side effects that usually do not require medical attention (report to your care team if they continue or are bothersome): Diarrhea Fatigue Hair loss Loss of appetite Nausea Vomiting This list may not describe all  possible side effects. Call your doctor for medical advice about side effects. You may report side effects to FDA at 1-800-FDA-1088. Where should I keep my medication? This medication is given in a hospital or clinic. It will not be stored at home. NOTE: This sheet is a summary. It may not cover all possible information. If you have questions about this medicine, talk to your doctor, pharmacist, or health care provider.  2024 Elsevier/Gold Standard (2022-05-02 00:00:00)  Gemcitabine Injection What is this medication? GEMCITABINE (jem SYE ta been) treats some types of cancer. It works by slowing down the growth of cancer cells. This medicine may be used for other purposes; ask your health care provider or pharmacist if you have questions. COMMON BRAND NAME(S): Gemzar, Infugem What should I tell my care team before I take this medication? They need to know if you have any of these conditions: Blood disorders Infection Kidney disease Liver disease Lung or breathing disease, such as asthma or COPD Recent or ongoing radiation therapy An unusual or allergic reaction to gemcitabine, other medications, foods, dyes, or preservatives If you or your partner are pregnant or trying to get pregnant Breast-feeding How should I use this medication? This medication is injected into a vein. It is given by your care team in a hospital or clinic setting. Talk to your care team about the use of this medication in children. Special care may be needed. Overdosage: If you think you have taken too much of this medicine contact a poison control center or emergency room at once. NOTE: This medicine is only for you. Do not share this medicine with others. What if I miss a dose? Keep appointments for follow-up doses. It is important not to miss your dose. Call your care team if you are unable to keep an appointment. What may interact with this medication? Interactions have not been studied. This list may not  describe all possible interactions. Give your health care provider a list of all the medicines, herbs, non-prescription drugs, or dietary supplements you use. Also tell them if you smoke, drink alcohol, or use illegal drugs. Some items may interact with your medicine. What should I watch for while using this medication? Your condition will be monitored carefully while you are receiving this medication. This medication may make you feel generally unwell. This is not uncommon, as chemotherapy can affect healthy cells as well as cancer cells. Report any side effects. Continue your course of treatment even though you feel ill unless your care team tells you to stop. In some cases, you may be given additional medications to help with side effects. Follow all directions for their use. This medication may increase your risk of getting an infection. Call your care team for advice if you get a fever, chills, sore throat, or other symptoms of a cold or flu. Do not treat yourself. Try to avoid being around people who   are sick. This medication may increase your risk to bruise or bleed. Call your care team if you notice any unusual bleeding. Be careful brushing or flossing your teeth or using a toothpick because you may get an infection or bleed more easily. If you have any dental work done, tell your dentist you are receiving this medication. Avoid taking medications that contain aspirin, acetaminophen, ibuprofen, naproxen, or ketoprofen unless instructed by your care team. These medications may hide a fever. Talk to your care team if you or your partner wish to become pregnant or think you might be pregnant. This medication can cause serious birth defects if taken during pregnancy and for 6 months after the last dose. A negative pregnancy test is required before starting this medication. A reliable form of contraception is recommended while taking this medication and for 6 months after the last dose. Talk to your care  team about effective forms of contraception. Do not father a child while taking this medication and for 3 months after the last dose. Use a condom while having sex during this time period. Do not breastfeed while taking this medication and for at least 1 week after the last dose. This medication may cause infertility. Talk to your care team if you are concerned about your fertility. What side effects may I notice from receiving this medication? Side effects that you should report to your care team as soon as possible: Allergic reactions--skin rash, itching, hives, swelling of the face, lips, tongue, or throat Capillary leak syndrome--stomach or muscle pain, unusual weakness or fatigue, feeling faint or lightheaded, decrease in the amount of urine, swelling of the ankles, hands, or feet, trouble breathing Infection--fever, chills, cough, sore throat, wounds that don't heal, pain or trouble when passing urine, general feeling of discomfort or being unwell Liver injury--right upper belly pain, loss of appetite, nausea, light-colored stool, dark yellow or brown urine, yellowing skin or eyes, unusual weakness or fatigue Low red blood cell level--unusual weakness or fatigue, dizziness, headache, trouble breathing Lung injury--shortness of breath or trouble breathing, cough, spitting up blood, chest pain, fever Stomach pain, bloody diarrhea, pale skin, unusual weakness or fatigue, decrease in the amount of urine, which may be signs of hemolytic uremic syndrome Sudden and severe headache, confusion, change in vision, seizures, which may be signs of posterior reversible encephalopathy syndrome (PRES) Unusual bruising or bleeding Side effects that usually do not require medical attention (report to your care team if they continue or are bothersome): Diarrhea Drowsiness Hair loss Nausea Pain, redness, or swelling with sores inside the mouth or throat Vomiting This list may not describe all possible side  effects. Call your doctor for medical advice about side effects. You may report side effects to FDA at 1-800-FDA-1088. Where should I keep my medication? This medication is given in a hospital or clinic. It will not be stored at home. NOTE: This sheet is a summary. It may not cover all possible information. If you have questions about this medicine, talk to your doctor, pharmacist, or health care provider.  2024 Elsevier/Gold Standard (2022-04-23 00:00:00)  

## 2023-09-30 NOTE — Progress Notes (Signed)
  [facility] OFFICE PROGRESS NOTE   Diagnosis: Pancreas cancer  INTERVAL HISTORY:   [name] returns as scheduled.  She completed cycle 4 gemcitabine/Abraxane 09/16/2023.  Overall she feels well.  She tends to be fatigued for a few days after chemotherapy.  No nausea or vomiting.  No mouth sores.  Recently had a few loose stools.  Took an Imodium with relief.  No fever or rash after treatment.  Stable dyspnea on exertion.  No cough.  No numbness or tingling in the hands or feet.  No abdominal pain.  Objective:  Vital signs in last 24 hours:  Blood pressure 139/65, pulse 76, temperature 97.6 F (36.4 C), temperature source Oral, resp. rate 18, height 5\' 6"  (1.676 m), weight 120 lb 1.6 oz (54.5 kg), SpO2 100%.    HEENT: No thrush or ulcers. Resp: Lungs clear bilaterally. Cardio: Regular rate and rhythm. GI: No hepatosplenomegaly. Vascular: No leg edema. Skin: No rash. Port-A-Cath without erythema.  Lab Results:  Lab Results  Component Value Date   WBC 4.8 09/30/2023   HGB 10.8 (L) 09/30/2023   HCT 33.2 (L) 09/30/2023   MCV 90.5 09/30/2023   PLT 236 09/30/2023   NEUTROABS 2.8 09/30/2023    Imaging:  No results found.  Medications: I have reviewed the patient's current medications.  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  Fluid-filled and distended stomach and proximal duodenum, single 11 mm periportal lymph node EGD 07/08/2023-large food residue in the stomach, extrinsic deformity at the first portion of the duodenum-biopsied Elevated CA 19-9 CT chest 07/11/2023-scattered small pulmonary nodules-indeterminate Cycle 1 gemcitabine/Abraxane 08/05/2023 Cycle 2  gemcitabine/Abraxane 08/19/2023 Cycle 3 gemcitabine/Abraxane 09/02/2023 Cycle 4 gemcitabine/Abraxane 09/16/2023 Cycle 5 gemcitabine/Abraxane 09/30/2023 2.   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   Anemia 4.   Iron deficiency 5.   B12 deficiency 6.   Atrial fibrillation-maintained on Xarelto prior to hospital admission 7.  Family history of pancreas and uterine cancer 8.  Hyperlipidemia 9.  Genetics-negative    Disposition: Wendy [name] appears stable.  She has completed 4 cycles of gemcitabine/Abraxane.  She is tolerating chemotherapy well.  Most recent CA 19-9 tumor marker was lower.  Plan to proceed with cycle 5 today as scheduled.  Restaging CTs prior to next office visit.  CBC and chemistry panel reviewed.  Labs adequate to proceed as above.  She will return for follow-up and treatment as scheduled in 2 weeks.    Lonna Cobb ANP/GNP-BC   09/30/2023  11:09 AM

## 2023-10-01 ENCOUNTER — Telehealth: Payer: Self-pay

## 2023-10-01 ENCOUNTER — Other Ambulatory Visit: Payer: Self-pay

## 2023-10-01 NOTE — Telephone Encounter (Signed)
-----   Message from Lonna Cobb sent at 10/01/2023 10:25 AM EDT ----- Please let her know the ferritin is now in normal range.  Continue oral iron.  Follow-up as scheduled.

## 2023-10-01 NOTE — Telephone Encounter (Signed)
Patient gave verbal understanding and had no further questions or concerns  

## 2023-10-02 ENCOUNTER — Other Ambulatory Visit: Payer: Self-pay

## 2023-10-02 ENCOUNTER — Telehealth: Payer: Self-pay

## 2023-10-02 DIAGNOSIS — C250 Malignant neoplasm of head of pancreas: Secondary | ICD-10-CM

## 2023-10-02 NOTE — Telephone Encounter (Signed)
Mrs. Bokhari daughter called to inquire about her mother's CA 19-9 test results. I informed her that the CA 19-9 test was not performed during her mother's recent visit, but it is scheduled to be done at the next office visit. The daughter mentioned that the provider indicated the CA 19-9 test would be drawn at every visit. I clarified that we typically order this lab test once a month. I assured her that I would discuss the matter with the provider to determine if her mother should be scheduled for the CA 19-9 test as soon as possible.

## 2023-10-09 ENCOUNTER — Inpatient Hospital Stay: Payer: Medicare Other

## 2023-10-09 ENCOUNTER — Ambulatory Visit (HOSPITAL_BASED_OUTPATIENT_CLINIC_OR_DEPARTMENT_OTHER)
Admission: RE | Admit: 2023-10-09 | Discharge: 2023-10-09 | Disposition: A | Discharge status: Home / Self Care | Payer: Medicare Other | Source: Ambulatory Visit | Attending: Oncology | Admitting: Oncology

## 2023-10-09 DIAGNOSIS — C250 Malignant neoplasm of head of pancreas: Secondary | ICD-10-CM | POA: Insufficient documentation

## 2023-10-09 DIAGNOSIS — K8689 Other specified diseases of pancreas: Secondary | ICD-10-CM | POA: Diagnosis not present

## 2023-10-09 DIAGNOSIS — C259 Malignant neoplasm of pancreas, unspecified: Secondary | ICD-10-CM | POA: Diagnosis not present

## 2023-10-09 DIAGNOSIS — E041 Nontoxic single thyroid nodule: Secondary | ICD-10-CM | POA: Diagnosis not present

## 2023-10-09 MED ORDER — IOHEXOL 300 MG/ML  SOLN
100.0000 mL | Freq: Once | INTRAMUSCULAR | Status: AC | PRN
  Administered 2023-10-09: 100 mL via INTRAVENOUS

## 2023-10-09 MED ORDER — HEPARIN SOD (PORK) LOCK FLUSH 100 UNIT/ML IV SOLN
500.0000 [IU] | Freq: Once | INTRAVENOUS | Status: AC
  Administered 2023-10-09: 500 [IU] via INTRAVENOUS

## 2023-10-09 NOTE — Patient Instructions (Signed)

## 2023-10-10 LAB — CANCER ANTIGEN 19-9: CA 19-9: 136 U/mL — ABNORMAL HIGH (ref 0–35)

## 2023-10-12 ENCOUNTER — Other Ambulatory Visit: Payer: Self-pay | Admitting: Oncology

## 2023-10-13 ENCOUNTER — Inpatient Hospital Stay: Payer: Medicare Other

## 2023-10-13 ENCOUNTER — Encounter: Payer: Self-pay | Admitting: *Deleted

## 2023-10-13 ENCOUNTER — Inpatient Hospital Stay (HOSPITAL_BASED_OUTPATIENT_CLINIC_OR_DEPARTMENT_OTHER): Payer: Medicare Other | Admitting: Oncology

## 2023-10-13 VITALS — BP 154/71 | HR 64

## 2023-10-13 VITALS — BP 125/58 | HR 100 | Temp 97.9°F | Resp 18 | Ht 66.0 in | Wt 119.5 lb

## 2023-10-13 DIAGNOSIS — C250 Malignant neoplasm of head of pancreas: Secondary | ICD-10-CM

## 2023-10-13 DIAGNOSIS — Z5111 Encounter for antineoplastic chemotherapy: Secondary | ICD-10-CM | POA: Diagnosis not present

## 2023-10-13 LAB — CMP (CANCER CENTER ONLY)
ALT: 85 U/L — ABNORMAL HIGH (ref 0–44)
AST: 69 U/L — ABNORMAL HIGH (ref 15–41)
Albumin: 3.8 g/dL (ref 3.5–5.0)
Alkaline Phosphatase: 229 U/L — ABNORMAL HIGH (ref 38–126)
Anion gap: 8 (ref 5–15)
BUN: 24 mg/dL — ABNORMAL HIGH (ref 8–23)
CO2: 26 mmol/L (ref 22–32)
Calcium: 9.8 mg/dL (ref 8.9–10.3)
Chloride: 104 mmol/L (ref 98–111)
Creatinine: 0.68 mg/dL (ref 0.44–1.00)
GFR, Estimated: >60 mL/min (ref >60)
Glucose, Bld: 120 mg/dL — ABNORMAL HIGH (ref 70–99)
Potassium: 3.9 mmol/L (ref 3.5–5.1)
Sodium: 138 mmol/L (ref 135–145)
Total Bilirubin: 0.4 mg/dL (ref 0.3–1.2)
Total Protein: 6.3 g/dL — ABNORMAL LOW (ref 6.5–8.1)

## 2023-10-13 LAB — CBC WITH DIFFERENTIAL (CANCER CENTER ONLY)
Abs Immature Granulocytes: 0.01 10*3/uL (ref 0.00–0.07)
Basophils Absolute: 0.1 10*3/uL (ref 0.0–0.1)
Basophils Relative: 1 %
Eosinophils Absolute: 0.3 10*3/uL (ref 0.0–0.5)
Eosinophils Relative: 7 %
HCT: 32.4 % — ABNORMAL LOW (ref 36.0–46.0)
Hemoglobin: 10.4 g/dL — ABNORMAL LOW (ref 12.0–15.0)
Immature Granulocytes: 0 %
Lymphocytes Relative: 17 %
Lymphs Abs: 0.8 10*3/uL (ref 0.7–4.0)
MCH: 28.8 pg (ref 26.0–34.0)
MCHC: 32.1 g/dL (ref 30.0–36.0)
MCV: 89.8 fL (ref 80.0–100.0)
Monocytes Absolute: 0.5 10*3/uL (ref 0.1–1.0)
Monocytes Relative: 11 %
Neutro Abs: 2.9 10*3/uL (ref 1.7–7.7)
Neutrophils Relative %: 64 %
Platelet Count: 247 10*3/uL (ref 150–400)
RBC: 3.61 MIL/uL — ABNORMAL LOW (ref 3.87–5.11)
RDW: 14.8 % (ref 11.5–15.5)
WBC Count: 4.5 10*3/uL (ref 4.0–10.5)
nRBC: 0 % (ref 0.0–0.2)

## 2023-10-13 MED ORDER — PACLITAXEL PROTEIN-BOUND CHEMO INJECTION 100 MG
100.0000 mg/m2 | Freq: Once | INTRAVENOUS | Status: AC
  Administered 2023-10-13: 150 mg via INTRAVENOUS
  Filled 2023-10-13: qty 30

## 2023-10-13 MED ORDER — HEPARIN SOD (PORK) LOCK FLUSH 100 UNIT/ML IV SOLN
500.0000 [IU] | Freq: Once | INTRAVENOUS | Status: AC | PRN
  Administered 2023-10-13: 500 [IU]

## 2023-10-13 MED ORDER — PROCHLORPERAZINE MALEATE 10 MG PO TABS
10.0000 mg | ORAL_TABLET | Freq: Once | ORAL | Status: AC
  Administered 2023-10-13: 10 mg via ORAL
  Filled 2023-10-13: qty 1

## 2023-10-13 MED ORDER — SODIUM CHLORIDE 0.9 % IV SOLN: Freq: Once | INTRAVENOUS | Status: AC

## 2023-10-13 MED ORDER — SODIUM CHLORIDE 0.9% FLUSH
10.0000 mL | INTRAVENOUS | Status: DC | PRN
  Administered 2023-10-13: 10 mL

## 2023-10-13 MED ORDER — SODIUM CHLORIDE 0.9 % IV SOLN
1000.0000 mg/m2 | Freq: Once | INTRAVENOUS | Status: AC
  Administered 2023-10-13: 1634 mg via INTRAVENOUS
  Filled 2023-10-13: qty 25.98

## 2023-10-13 NOTE — Progress Notes (Signed)
[facility] OFFICE PROGRESS NOTE   Diagnosis: Pancreas cancer  INTERVAL HISTORY:   Wendy [name] completed on cycle of gemcitabine/Abraxane 09/30/2023.  No nausea, fever, rash, or neuropathy symptoms.  She feels well.  She is exercising.  She feels a "lump "in the left groin.  Objective:  Vital signs in last 24 hours:  Blood pressure (!) 125/58, pulse 100, temperature 97.9 F (36.6 C), temperature source Temporal, resp. rate 18, height 5\' 6"  (1.676 m), weight 119 lb 8 oz (54.2 kg), SpO2 100%.    HEENT: No thrush or ulcers Lymphatics: No cervical, supraclavicular, axillary, or inguinal nodes Resp: Lungs clear bilaterally Cardio: Regular rate and rhythm GI: No hepatosplenomegaly, no mass, nontender, soft mobile cutaneous 1.5 cm lesion at the left inguinal area Vascular: No leg edema   Portacath/PICC-without erythema  Lab Results:  Lab Results  Component Value Date   WBC 4.5 10/13/2023   HGB 10.4 (L) 10/13/2023   HCT 32.4 (L) 10/13/2023   MCV 89.8 10/13/2023   PLT 247 10/13/2023   NEUTROABS 2.9 10/13/2023    CMP  Lab Results  Component Value Date   NA 138 10/13/2023   K 3.9 10/13/2023   CL 104 10/13/2023   CO2 26 10/13/2023   GLUCOSE 120 (H) 10/13/2023   BUN 24 (H) 10/13/2023   CREATININE 0.68 10/13/2023   CALCIUM 9.8 10/13/2023   PROT 6.3 (L) 10/13/2023   ALBUMIN 3.8 10/13/2023   AST 69 (H) 10/13/2023   ALT 85 (H) 10/13/2023   ALKPHOS 229 (H) 10/13/2023   BILITOT 0.4 10/13/2023   GFRNONAA >60 10/13/2023    Lab Results  Component Value Date   UJW119 136 (H) 10/09/2023    No results found for: "INR", "LABPROT"  Imaging:  CT CHEST ABDOMEN PELVIS W CONTRAST  Result Date: 10/10/2023 CLINICAL DATA:  Restaging pancreatic cancer. * Tracking Code: BO * EXAM: CT CHEST, ABDOMEN, AND PELVIS WITH CONTRAST TECHNIQUE: Multidetector CT imaging of the chest, abdomen and pelvis was performed following the standard protocol during bolus administration  of intravenous contrast. RADIATION DOSE REDUCTION: This exam was performed according to the departmental dose-optimization program which includes automated exposure control, adjustment of the mA and/or kV according to patient size and/or use of iterative reconstruction technique. CONTRAST:  OMNIPAQUE IOHEXOL 300 MG/ML  SOLN COMPARISON:  Multiple exams, including 07/11/2023 and 07/07/2023 FINDINGS: CT CHEST FINDINGS Cardiovascular: Right Port-A-Cath tip: SVC. Coronary, aortic arch, and branch vessel atherosclerotic vascular disease. Mediastinum/Nodes: Hypodense 1.6 cm left thyroid nodule. Recommend thyroid US (ref: J Am Coll Radiol. 2015 Feb;12(2): 143-50). No pathologic adenopathy. Lungs/Pleura: Biapical pleuroparenchymal scarring. A few bilateral lower lobe nodules are present. These appear similar to prior. The largest nodule measures 6 by 5 mm in the right lower lobe on image 127 of series 4, stable. Musculoskeletal: Mild thoracic spondylosis. CT ABDOMEN PELVIS FINDINGS Hepatobiliary: No metastatic lesion to the liver is identified. Mild intrahepatic biliary dilatation is present with common hepatic duct dilated up to 1.2 cm and a tapered irregular appearance in the distal common bile duct in the vicinity of the pancreatic head. Pancreas: Prominently dilated dorsal pancreatic duct substantially worsened from prior, currently up to 1 cm in diameter, previously 0.3 cm. Upper uptake bring at the ampulla with other serpentine fluid signal intensity lesions along the pancreatic head likely representing dilated ventral duct. This may well be caused by the pancreatic mass, but the expandable duodenal stent which traverses the region of the ampulla may be contributory as well. There  is some distortion of the orientation of the pancreatic head and the mass due to the presence of a new expandable stent in the duodenum adjacent to mass. However, compensating for this, the pancreatic mass appears reduced in size, I  measure at and about 3.1 by 1.3 cm on image 81 of series 2, formerly up to 4.0 by 3.2 cm. Spleen: Unremarkable Adrenals/Urinary Tract: Unremarkable Stomach/Bowel: As noted above there is an expandable duodenal stent traversing the duodenal bulb and second portion of the duodenum, the stent is patent although there may be some epithelialization proximally. Stent caliber 2.4 cm. Vascular/Lymphatic: Atherosclerosis is present, including aortoiliac atherosclerotic disease. Narrowing of the proximal celiac trunk due to atheromatous vascular disease and median arcuate ligament. Narrowed proximal SMA nonspecific although soft plaque may be contributory. Neither vessel is overtly occluded. The portal vein, SMV, and splenic vein are patent without evidence of tumor invasion. No tumor directly around the SMA or celiac trunk. Reproductive: Unremarkable Other: No supplemental non-categorized findings. Musculoskeletal: Prominent degenerative hip arthropathy bilaterally. Mild lumbar spondylosis and degenerative disc disease. IMPRESSION: 1. The pancreatic mass appears reduced in size, currently 3.1 by 1.3 cm, formerly up to 4.0 by 3.2 cm. However, the new duodenal stent in this region may be changing the orientation of the mass. 2. The pancreatic duct caliber is substantially increased from prior, currently up to 1 cm in diameter, previously 0.3 cm. There is also new biliary dilatation. This may well be caused by the pancreatic mass, but the expandable duodenal stent which traverses the region of the ampulla may be contributory as well. 3. A few bilateral lower lobe nodules are present, the largest measuring 6 by 5 mm in the right lower lobe, stable. Surveillance recommended. 4. Hypodense 1.6 cm left thyroid nodule. Recommend thyroid US. 5. Aortic atherosclerosis. Aortic Atherosclerosis (ICD10-I70.0). Electronically Signed   By: Gaylyn Rong M.D.   On: 10/10/2023 17:21    Medications: I have reviewed the patient's current  medications.   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  Fluid-filled and distended stomach and proximal duodenum, single 11 mm periportal lymph node EGD 07/08/2023-large food residue in the stomach, extrinsic deformity at the first portion of the duodenum-biopsied Elevated CA 19-9 CT chest 07/11/2023-scattered small pulmonary nodules-indeterminate Cycle 1 gemcitabine/Abraxane 08/05/2023 Cycle 2 gemcitabine/Abraxane 08/19/2023 Cycle 3 gemcitabine/Abraxane 09/02/2023 Cycle 4 gemcitabine/Abraxane 09/16/2023 Cycle 5 gemcitabine/Abraxane 09/30/2023 CTs 10/09/2023-crease size of pancreas mass, increased pancreatic duct caliber and new biliary dilation, bilateral lower lobe nodules are stable, hypodense left thyroid nodule Cycle 6 gemcitabine/Abraxane 10/13/2023 2.   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   Anemia 4.   Iron deficiency 5.   B12 deficiency 6.   Atrial fibrillation-maintained on Xarelto prior to hospital admission 7.  Family history of pancreas and uterine cancer 8.  Hyperlipidemia 9.  Genetics-negative    Disposition: Wendy [name] appears stable.  I reviewed the CT findings and images with her.  The CA 19-9 is lower and the pancreas mass is smaller.  There is no clinical or radiologic evidence of disease progression.  She will complete cycle 6 gemcitabine/Abraxane today.  She will schedule a follow-up appoint with Dr. [name] to discuss the indication for surgery.  Wendy [name] will return  for an office visit and chemotherapy in 2 weeks.  The lesion at the left groin appears to be a lipoma.  The left thyroid lesion is likely  benign and appears to have been present on the chest CT in July.  She reports undergoing a thyroid biopsy in the past.  [name], MD  10/13/2023  9:45 AM

## 2023-10-13 NOTE — Progress Notes (Signed)
Patient seen by Dr. [name] today  Vitals are within treatment parameters:Yes   Labs are within treatment parameters: No (Please specify and give further instructions.) OK to treat w/ALT 85  Treatment plan has been signed: Yes   Per physician team, Patient is ready for treatment and there are NO modifications to the treatment plan.

## 2023-10-13 NOTE — Patient Instructions (Signed)
[facility]   Discharge Instructions: Thank you for choosing [facility] to provide your oncology and hematology care.   If you have a lab appointment with the Cancer Center, please go directly to the Cancer Center and check in at the registration area.   Wear comfortable clothing and clothing appropriate for easy access to any Portacath or PICC line.   We strive to give you quality time with your provider. You may need to reschedule your appointment if you arrive late (15 or more minutes).  Arriving late affects you and other patients whose appointments are after yours.  Also, if you miss three or more appointments without notifying the office, you may be dismissed from the clinic at the provider's discretion.      For prescription refill requests, have your pharmacy contact our office and allow 72 hours for refills to be completed.    Today you received the following chemotherapy and/or immunotherapy agents Abraxane/Gemzar      To help prevent nausea and vomiting after your treatment, we encourage you to take your nausea medication as directed.  BELOW ARE SYMPTOMS THAT SHOULD BE REPORTED IMMEDIATELY: *FEVER GREATER THAN 100.4 F (38 C) OR HIGHER *CHILLS OR SWEATING *NAUSEA AND VOMITING THAT IS NOT CONTROLLED WITH YOUR NAUSEA MEDICATION *UNUSUAL SHORTNESS OF BREATH *UNUSUAL BRUISING OR BLEEDING *URINARY PROBLEMS (pain or burning when urinating, or frequent urination) *BOWEL PROBLEMS (unusual diarrhea, constipation, pain near the anus) TENDERNESS IN MOUTH AND THROAT WITH OR WITHOUT PRESENCE OF ULCERS (sore throat, sores in mouth, or a toothache) UNUSUAL RASH, SWELLING OR PAIN  UNUSUAL VAGINAL DISCHARGE OR ITCHING   Items with * indicate a potential emergency and should be followed up as soon as possible or go to the Emergency Department if any problems should occur.  Please show the CHEMOTHERAPY ALERT CARD or IMMUNOTHERAPY ALERT CARD at  check-in to the Emergency Department and triage nurse.  Should you have questions after your visit or need to cancel or reschedule your appointment, please contact [facility]  Dept: [phone]  and follow the prompts.  Office hours are 8:00 a.m. to 4:30 p.m. Monday - Friday. Please note that voicemails left after 4:00 p.m. may not be returned until the following business day.  We are closed weekends and major holidays. You have access to a nurse at all times for urgent questions. Please call the main number to the clinic Dept: [phone] and follow the prompts.   For any non-urgent questions, you may also contact your provider using MyChart. We now offer e-Visits for anyone 80 and older to request care online for non-urgent symptoms. For details visit [email or web address].   Also download the MyChart app! Go to the app store, search "MyChart", open the app, select , and log in with your MyChart username and password.  Paclitaxel Nanoparticle Albumin-Bound Injection What is this medication? NANOPARTICLE ALBUMIN-BOUND PACLITAXEL (Na no PAHR ti kuhl al BYOO muhn-bound PAK li TAX el) treats some types of cancer. It works by slowing down the growth of cancer cells. This medicine may be used for other purposes; ask your health care provider or pharmacist if you have questions. COMMON BRAND NAME(S): Abraxane What should I tell my care team before I take this medication? They need to know if you have any of these conditions: Liver disease Low white blood cell levels An unusual or allergic reaction to paclitaxel, albumin, other medications, foods, dyes, or preservatives If you  or your partner are pregnant or trying to get pregnant Breast-feeding How should I use this medication? This medication is injected into a vein. It is given by your care team in a hospital or clinic setting. Talk to your care team about the use of this medication in children.  Special care may be needed. Overdosage: If you think you have taken too much of this medicine contact a poison control center or emergency room at once. NOTE: This medicine is only for you. Do not share this medicine with others. What if I miss a dose? Keep appointments for follow-up doses. It is important not to miss your dose. Call your care team if you are unable to keep an appointment. What may interact with this medication? Other medications may affect the way this medication works. Talk with your care team about all of the medications you take. They may suggest changes to your treatment plan to lower the risk of side effects and to make sure your medications work as intended. This list may not describe all possible interactions. Give your health care provider a list of all the medicines, herbs, non-prescription drugs, or dietary supplements you use. Also tell them if you smoke, drink alcohol, or use illegal drugs. Some items may interact with your medicine. What should I watch for while using this medication? Your condition will be monitored carefully while you are receiving this medication. You may need blood work while taking this medication. This medication may make you feel generally unwell. This is not uncommon as chemotherapy can affect healthy cells as well as cancer cells. Report any side effects. Continue your course of treatment even though you feel ill unless your care team tells you to stop. This medication can cause serious allergic reactions. To reduce the risk, your care team may give you other medications to take before receiving this one. Be sure to follow the directions from your care team. This medication may increase your risk of getting an infection. Call your care team for advice if you get a fever, chills, sore throat, or other symptoms of a cold or flu. Do not treat yourself. Try to avoid being around people who are sick. This medication may increase your risk to bruise or  bleed. Call your care team if you notice any unusual bleeding. Be careful brushing or flossing your teeth or using a toothpick because you may get an infection or bleed more easily. If you have any dental work done, tell your dentist you are receiving this medication. Talk to your care team if you or your partner may be pregnant. Serious birth defects can occur if you take this medication during pregnancy and for 6 months after the last dose. You will need a negative pregnancy test before starting this medication. Contraception is recommended while taking this medication and for 6 months after the last dose. Your care team can help you find the option that works for you. If your partner can get pregnant, use a condom during sex while taking this medication and for 3 months after the last dose. Do not breastfeed while taking this medication and for 2 weeks after the last dose. This medication may cause infertility. Talk to your care team if you are concerned about your fertility. What side effects may I notice from receiving this medication? Side effects that you should report to your care team as soon as possible: Allergic reactions--skin rash, itching, hives, swelling of the face, lips, tongue, or throat Dry cough, shortness of  breath or trouble breathing Infection--fever, chills, cough, sore throat, wounds that don't heal, pain or trouble when passing urine, general feeling of discomfort or being unwell Low red blood cell level--unusual weakness or fatigue, dizziness, headache, trouble breathing Pain, tingling, or numbness in the hands or feet Stomach pain, unusual weakness or fatigue, nausea, vomiting, diarrhea, or fever that lasts longer than expected Unusual bruising or bleeding Side effects that usually do not require medical attention (report to your care team if they continue or are bothersome): Diarrhea Fatigue Hair loss Loss of appetite Nausea Vomiting This list may not describe all  possible side effects. Call your doctor for medical advice about side effects. You may report side effects to FDA at 1-800-FDA-1088. Where should I keep my medication? This medication is given in a hospital or clinic. It will not be stored at home. NOTE: This sheet is a summary. It may not cover all possible information. If you have questions about this medicine, talk to your doctor, pharmacist, or health care provider.  2024 Elsevier/Gold Standard (2022-05-02 00:00:00)  Gemcitabine Injection What is this medication? GEMCITABINE (jem SYE ta been) treats some types of cancer. It works by slowing down the growth of cancer cells. This medicine may be used for other purposes; ask your health care provider or pharmacist if you have questions. COMMON BRAND NAME(S): Gemzar, Infugem What should I tell my care team before I take this medication? They need to know if you have any of these conditions: Blood disorders Infection Kidney disease Liver disease Lung or breathing disease, such as asthma or COPD Recent or ongoing radiation therapy An unusual or allergic reaction to gemcitabine, other medications, foods, dyes, or preservatives If you or your partner are pregnant or trying to get pregnant Breast-feeding How should I use this medication? This medication is injected into a vein. It is given by your care team in a hospital or clinic setting. Talk to your care team about the use of this medication in children. Special care may be needed. Overdosage: If you think you have taken too much of this medicine contact a poison control center or emergency room at once. NOTE: This medicine is only for you. Do not share this medicine with others. What if I miss a dose? Keep appointments for follow-up doses. It is important not to miss your dose. Call your care team if you are unable to keep an appointment. What may interact with this medication? Interactions have not been studied. This list may not  describe all possible interactions. Give your health care provider a list of all the medicines, herbs, non-prescription drugs, or dietary supplements you use. Also tell them if you smoke, drink alcohol, or use illegal drugs. Some items may interact with your medicine. What should I watch for while using this medication? Your condition will be monitored carefully while you are receiving this medication. This medication may make you feel generally unwell. This is not uncommon, as chemotherapy can affect healthy cells as well as cancer cells. Report any side effects. Continue your course of treatment even though you feel ill unless your care team tells you to stop. In some cases, you may be given additional medications to help with side effects. Follow all directions for their use. This medication may increase your risk of getting an infection. Call your care team for advice if you get a fever, chills, sore throat, or other symptoms of a cold or flu. Do not treat yourself. Try to avoid being around people who  are sick. This medication may increase your risk to bruise or bleed. Call your care team if you notice any unusual bleeding. Be careful brushing or flossing your teeth or using a toothpick because you may get an infection or bleed more easily. If you have any dental work done, tell your dentist you are receiving this medication. Avoid taking medications that contain aspirin, acetaminophen, ibuprofen, naproxen, or ketoprofen unless instructed by your care team. These medications may hide a fever. Talk to your care team if you or your partner wish to become pregnant or think you might be pregnant. This medication can cause serious birth defects if taken during pregnancy and for 6 months after the last dose. A negative pregnancy test is required before starting this medication. A reliable form of contraception is recommended while taking this medication and for 6 months after the last dose. Talk to your care  team about effective forms of contraception. Do not father a child while taking this medication and for 3 months after the last dose. Use a condom while having sex during this time period. Do not breastfeed while taking this medication and for at least 1 week after the last dose. This medication may cause infertility. Talk to your care team if you are concerned about your fertility. What side effects may I notice from receiving this medication? Side effects that you should report to your care team as soon as possible: Allergic reactions--skin rash, itching, hives, swelling of the face, lips, tongue, or throat Capillary leak syndrome--stomach or muscle pain, unusual weakness or fatigue, feeling faint or lightheaded, decrease in the amount of urine, swelling of the ankles, hands, or feet, trouble breathing Infection--fever, chills, cough, sore throat, wounds that don't heal, pain or trouble when passing urine, general feeling of discomfort or being unwell Liver injury--right upper belly pain, loss of appetite, nausea, light-colored stool, dark yellow or brown urine, yellowing skin or eyes, unusual weakness or fatigue Low red blood cell level--unusual weakness or fatigue, dizziness, headache, trouble breathing Lung injury--shortness of breath or trouble breathing, cough, spitting up blood, chest pain, fever Stomach pain, bloody diarrhea, pale skin, unusual weakness or fatigue, decrease in the amount of urine, which may be signs of hemolytic uremic syndrome Sudden and severe headache, confusion, change in vision, seizures, which may be signs of posterior reversible encephalopathy syndrome (PRES) Unusual bruising or bleeding Side effects that usually do not require medical attention (report to your care team if they continue or are bothersome): Diarrhea Drowsiness Hair loss Nausea Pain, redness, or swelling with sores inside the mouth or throat Vomiting This list may not describe all possible side  effects. Call your doctor for medical advice about side effects. You may report side effects to FDA at 1-800-FDA-1088. Where should I keep my medication? This medication is given in a hospital or clinic. It will not be stored at home. NOTE: This sheet is a summary. It may not cover all possible information. If you have questions about this medicine, talk to your doctor, pharmacist, or health care provider.  2024 Elsevier/Gold Standard (2022-04-23 00:00:00)

## 2023-10-14 ENCOUNTER — Inpatient Hospital Stay: Payer: Medicare Other | Admitting: Oncology

## 2023-10-14 ENCOUNTER — Inpatient Hospital Stay: Payer: Medicare Other

## 2023-10-14 ENCOUNTER — Other Ambulatory Visit: Payer: Self-pay

## 2023-10-14 LAB — CANCER ANTIGEN 19-9: CA 19-9: 134 U/mL — ABNORMAL HIGH (ref 0–35)

## 2023-10-22 ENCOUNTER — Other Ambulatory Visit: Payer: Self-pay | Admitting: *Deleted

## 2023-10-22 NOTE — Progress Notes (Signed)
The proposed treatment discussed in conference is for discussion purpose only and is not a binding recommendation.  The patients have not been physically examined, or presented with their treatment options.  Therefore, final treatment plans cannot be decided.  

## 2023-10-23 ENCOUNTER — Other Ambulatory Visit: Payer: Self-pay

## 2023-10-26 ENCOUNTER — Other Ambulatory Visit: Payer: Self-pay | Admitting: Oncology

## 2023-10-28 ENCOUNTER — Inpatient Hospital Stay: Payer: Medicare Other

## 2023-10-28 ENCOUNTER — Inpatient Hospital Stay (HOSPITAL_BASED_OUTPATIENT_CLINIC_OR_DEPARTMENT_OTHER): Payer: Medicare Other | Admitting: Nurse Practitioner

## 2023-10-28 ENCOUNTER — Encounter: Payer: Self-pay | Admitting: Nurse Practitioner

## 2023-10-28 VITALS — BP 128/72 | HR 89 | Temp 97.9°F | Resp 18 | Ht 66.0 in | Wt 118.0 lb

## 2023-10-28 DIAGNOSIS — C250 Malignant neoplasm of head of pancreas: Secondary | ICD-10-CM

## 2023-10-28 DIAGNOSIS — Z95828 Presence of other vascular implants and grafts: Secondary | ICD-10-CM

## 2023-10-28 DIAGNOSIS — Z5111 Encounter for antineoplastic chemotherapy: Secondary | ICD-10-CM | POA: Diagnosis not present

## 2023-10-28 LAB — CBC WITH DIFFERENTIAL (CANCER CENTER ONLY)
Abs Immature Granulocytes: 0.01 10*3/uL (ref 0.00–0.07)
Basophils Absolute: 0.1 10*3/uL (ref 0.0–0.1)
Basophils Relative: 1 %
Eosinophils Absolute: 0.2 10*3/uL (ref 0.0–0.5)
Eosinophils Relative: 3 %
HCT: 30.6 % — ABNORMAL LOW (ref 36.0–46.0)
Hemoglobin: 9.8 g/dL — ABNORMAL LOW (ref 12.0–15.0)
Immature Granulocytes: 0 %
Lymphocytes Relative: 13 %
Lymphs Abs: 0.7 10*3/uL (ref 0.7–4.0)
MCH: 28.7 pg (ref 26.0–34.0)
MCHC: 32 g/dL (ref 30.0–36.0)
MCV: 89.5 fL (ref 80.0–100.0)
Monocytes Absolute: 0.7 10*3/uL (ref 0.1–1.0)
Monocytes Relative: 12 %
Neutro Abs: 3.9 10*3/uL (ref 1.7–7.7)
Neutrophils Relative %: 71 %
Platelet Count: 290 10*3/uL (ref 150–400)
RBC: 3.42 MIL/uL — ABNORMAL LOW (ref 3.87–5.11)
RDW: 16 % — ABNORMAL HIGH (ref 11.5–15.5)
WBC Count: 5.5 10*3/uL (ref 4.0–10.5)
nRBC: 0 % (ref 0.0–0.2)

## 2023-10-28 LAB — CMP (CANCER CENTER ONLY)
ALT: 190 U/L — ABNORMAL HIGH (ref 0–44)
AST: 147 U/L — ABNORMAL HIGH (ref 15–41)
Albumin: 4 g/dL (ref 3.5–5.0)
Alkaline Phosphatase: 785 U/L — ABNORMAL HIGH (ref 38–126)
Anion gap: 6 (ref 5–15)
BUN: 23 mg/dL (ref 8–23)
CO2: 26 mmol/L (ref 22–32)
Calcium: 10.1 mg/dL (ref 8.9–10.3)
Chloride: 104 mmol/L (ref 98–111)
Creatinine: 0.52 mg/dL (ref 0.44–1.00)
GFR, Estimated: >60 mL/min (ref >60)
Glucose, Bld: 109 mg/dL — ABNORMAL HIGH (ref 70–99)
Potassium: 3.9 mmol/L (ref 3.5–5.1)
Sodium: 136 mmol/L (ref 135–145)
Total Bilirubin: 1.4 mg/dL — ABNORMAL HIGH (ref 0.3–1.2)
Total Protein: 6 g/dL — ABNORMAL LOW (ref 6.5–8.1)

## 2023-10-28 MED ORDER — SODIUM CHLORIDE 0.9% FLUSH
10.0000 mL | INTRAVENOUS | Status: AC | PRN
  Administered 2023-10-28: 10 mL via INTRAVENOUS

## 2023-10-28 MED ORDER — HEPARIN SOD (PORK) LOCK FLUSH 100 UNIT/ML IV SOLN
500.0000 [IU] | Freq: Once | INTRAVENOUS | Status: AC
  Administered 2023-10-28: 500 [IU] via INTRAVENOUS

## 2023-10-28 NOTE — Progress Notes (Addendum)
Wendy [name] OFFICE PROGRESS NOTE   Diagnosis: Pancreas cancer  INTERVAL HISTORY:   Wendy [name] returns as scheduled.  She completed cycle 6 gemcitabine/Abraxane 10/13/2023.  She denies nausea/vomiting.  No mouth sores.  No diarrhea.  No fever or rash after treatment.  No cough or shortness of breath.  She reports a good appetite.  She denies pain.  She has malaise for a few days after treatment.  Objective:  Vital signs in last 24 hours:  Blood pressure 128/72, pulse 89, temperature 97.9 F (36.6 C), temperature source Temporal, resp. rate 18, height 5\' 6"  (1.676 m), weight 118 lb (53.5 kg), SpO2 100%.    HEENT: No thrush or ulcers. Resp: Lungs clear bilaterally. Cardio: Regular rate and rhythm. GI: No hepatosplenomegaly. Vascular: No leg edema. Skin: No rash. Port-A-Cath without erythema.  Lab Results:  Lab Results  Component Value Date   WBC 4.5 10/13/2023   HGB 10.4 (L) 10/13/2023   HCT 32.4 (L) 10/13/2023   MCV 89.8 10/13/2023   PLT 247 10/13/2023   NEUTROABS 2.9 10/13/2023    Imaging:  No results found.  Medications: I have reviewed the patient's current medications.  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  Fluid-filled and distended stomach and proximal duodenum, single 11 mm periportal lymph node EGD 07/08/2023-large food residue in the stomach, extrinsic deformity at the first portion of the duodenum-biopsied Elevated CA 19-9 CT chest 07/11/2023-scattered small pulmonary nodules-indeterminate Cycle 1 gemcitabine/Abraxane 08/05/2023 Cycle 2 gemcitabine/Abraxane 08/19/2023 Cycle 3 gemcitabine/Abraxane 09/02/2023 Cycle 4 gemcitabine/Abraxane  09/16/2023 Cycle 5 gemcitabine/Abraxane 09/30/2023 CTs 10/09/2023-crease size of pancreas mass, increased pancreatic duct caliber and new biliary dilation, bilateral lower lobe nodules are stable, hypodense left thyroid nodule Cycle 6 gemcitabine/Abraxane 10/13/2023 10/28/2023 chemotherapy held due to LFT abnormalities  2.   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   Anemia 4.   Iron deficiency 5.   B12 deficiency 6.   Atrial fibrillation-maintained on Xarelto prior to hospital admission 7.  Family history of pancreas and uterine cancer 8.  Hyperlipidemia 9.  Genetics-negative  Disposition: Wendy [name] appears stable.  She has completed 6 cycles of gemcitabine/Abraxane.  She continues to tolerate chemotherapy well.    CBC and chemistry panel reviewed.  LFTs with progressive abnormality.  We are holding treatment today.  She and her family understand the rationale for holding chemotherapy in this setting (likely biliary obstruction).  Dr. Truett Perna discussed her case with Dr. Ewing Schlein.  We will also confer with Dr. [name].  Wendy [name] understands to contact the office with fever, chills, signs of infection.  She will return as scheduled in 2 weeks.  We are available to see her sooner if needed.  Patient seen with Dr. Truett Perna.  Wendy [name] ANP/GNP-BC   10/28/2023  8:16 AM  This was a shared visit with Wendy [name].  Wendy [name] presented today for scheduled chemotherapy.  The liver enzymes are markedly elevated and the bilirubin is elevated.  Her case was presented at the GI tumor conference last week.  The plan is for her to meet with Dr. [name] to consider a pancreaticoduodenectomy.  I discussed the case with Dr. Ewing Schlein.  He will consider an attempt at ERCP stent placement if Dr. [name]  does not plan for surgery within the next few weeks.  I will contact Dr. [name] to review management options.  I was present for greater than 50%  of today's visit.  I performed medical decision making.  Mancel Bale, MD

## 2023-10-28 NOTE — Addendum Note (Signed)
Addended by: Rana Snare on: 10/28/2023 09:16 AM   Modules accepted: Orders

## 2023-10-28 NOTE — Patient Instructions (Signed)

## 2023-10-28 NOTE — Addendum Note (Signed)
Addended by: Rana Snare on: 10/28/2023 09:24 AM   Modules accepted: Level of Service

## 2023-10-28 NOTE — Addendum Note (Signed)
Addended by: Dimitri Ped on: 10/28/2023 09:30 AM   Modules accepted: Orders

## 2023-10-28 NOTE — Progress Notes (Signed)
Patient seen by Lonna Cobb NP today  Vitals are within treatment parameters:Yes   Labs are within treatment parameters: No (Please specify and give further instructions.)  AST 147, ALT 190 Treatment plan has been signed: Yes   Per physician team, Patient will not be receiving treatment today.

## 2023-10-28 NOTE — Addendum Note (Signed)
Addended by: [name] B on: 10/28/2023 02:58 PM   Modules accepted: Level of Service

## 2023-10-28 NOTE — Addendum Note (Signed)
Addended by: Rana Snare on: 10/28/2023 09:24 AM   Modules accepted: Orders

## 2023-10-29 LAB — CANCER ANTIGEN 19-9: CA 19-9: 163 U/mL — ABNORMAL HIGH (ref 0–35)

## 2023-10-30 ENCOUNTER — Other Ambulatory Visit: Payer: Self-pay | Admitting: Gastroenterology

## 2023-10-30 ENCOUNTER — Other Ambulatory Visit: Payer: Self-pay

## 2023-10-30 ENCOUNTER — Ambulatory Visit: Payer: Self-pay | Admitting: Surgery

## 2023-10-30 DIAGNOSIS — K831 Obstruction of bile duct: Secondary | ICD-10-CM | POA: Diagnosis not present

## 2023-10-30 DIAGNOSIS — C259 Malignant neoplasm of pancreas, unspecified: Secondary | ICD-10-CM

## 2023-10-30 DIAGNOSIS — C801 Malignant (primary) neoplasm, unspecified: Secondary | ICD-10-CM | POA: Diagnosis not present

## 2023-10-30 DIAGNOSIS — C250 Malignant neoplasm of head of pancreas: Secondary | ICD-10-CM | POA: Diagnosis not present

## 2023-10-30 NOTE — H&P (Signed)
History of Present Illness: Wendy [name] is a 89 y.o. female who is seen today for follow up of pancreatic cancer.  He presented with gastric outlet obstruction in July and was found to have a large mass in the head of the pancreas.  She underwent placement of a duodenal stent.  Biopsies of the mass confirmed adenocarcinoma.  She never developed jaundice and thus did not require bile duct stenting.  Since her last visit with me, she has completed 6 cycles of neoadjuvant gemcitabine/Abraxane, with her last cycle on October 14.  She was scheduled for a cycle last week, however this was held as her LFTs were newly elevated.  T. bili was 1.4 and alk phos was 785.  Transaminases were also mildly elevated.  She is here today with her daughter.  She is overall doing well.  She says that she is still been able to eat without any nausea or vomiting, although she eats small frequent meals throughout the day.  She has lost only a small amount of weight during treatment.  She feels that her energy levels have improved significantly, and she has been exercising regularly.  She says she walks nearly a mile every day and has been doing this throughout chemotherapy.  She denies any abdominal pain.  She is here today to discuss the possibility of surgery.  Her recent restaging scans on 10/10 did not show any evidence of metastatic disease or disease progression.  His recent CA 19-9 on 10/14 was 134, which is decreased from 500 prior to treatment.   She has a history of A-fib, but remains off Xarelto, which was stopped when she was admitted with gastric outlet obstruction.  She also has mild to moderate mitral regurgitation.  She last saw her cardiologist Dr. [name] on 08/26/2023, at which time he felt that she was optimized for future surgery from a cardiac standpoint and did not require further workup prior to surgery.  She denies any chest pain or shortness of breath with activity.       Review of Systems: A complete  review of systems was obtained from the patient.  I have reviewed this information and discussed as appropriate with the patient.  See HPI as well for other ROS.       Medical History: Past Medical History Past Medical History: Diagnosis Date  Arthritis    History of cancer    Hypertension         Problem List There is no problem list on file for this patient.     Past Surgical History No past surgical history on file.     Allergies No Known Allergies    Medications Ordered Prior to Encounter Current Outpatient Medications on File Prior to Visit Medication Sig Dispense Refill  acetaminophen (TYLENOL) 500 MG tablet Take by mouth      benazepriL (LOTENSIN) 20 MG tablet Take 20 mg by mouth once daily      cyanocobalamin (VITAMIN B12) 1000 MCG tablet Take 1,000 mcg by mouth once daily      docusate (COLACE) 100 MG capsule Take 100 mg by mouth 2 (two) times daily      famotidine (PEPCID) 40 MG tablet Take 40 mg by mouth once daily      lidocaine-prilocaine (EMLA) cream Apply topically      ondansetron (ZOFRAN) 4 MG tablet Take 4 mg by mouth every 6 (six) hours as needed      polyethylene glycol (MIRALAX) powder DISSOLVE 17 GRAMS BY MOUTH  AS NEEDED FOR MILD CONSTIPATION      prochlorperazine (COMPAZINE) 5 MG tablet Take by mouth      simvastatin (ZOCOR) 40 MG tablet Take by mouth        No current facility-administered medications on file prior to visit.      Family History Family History Problem Relation Age of Onset  High blood pressure (Hypertension) Mother    Diabetes Father    Skin cancer Sister    High blood pressure (Hypertension) Sister    Diabetes Brother    Skin cancer Brother    Obesity Brother    High blood pressure (Hypertension) Brother        Tobacco Use History Social History    Tobacco Use Smoking Status Never Smokeless Tobacco Never      Social History Social History    Socioeconomic History  Marital status: Married Tobacco  Use  Smoking status: Never  Smokeless tobacco: Never Substance and Sexual Activity  Alcohol use: Not Currently  Drug use: Never    Social Drivers of Health    Financial Resource Strain: Low Risk  (08/05/2023)   Received from [facility]   Overall Financial Resource Strain (CARDIA)    Difficulty of Paying Living Expenses: Not hard at all Food Insecurity: No Food Insecurity (07/07/2023)   Received from [facility]   Hunger Vital Sign    Worried About Running Out of Food in the Last Year: Never true    Ran Out of Food in the Last Year: Never true Transportation Needs: No Transportation Needs (07/07/2023)   Received from [facility] - Transportation    Lack of Transportation (Medical): No    Lack of Transportation (Non-Medical): No      Objective:     Vitals:   10/30/23 1358 BP: (!) 148/67 Pulse: 96 Temp: 36.1 C (97 F) SpO2: 99% Weight: 53.5 kg (118 lb) Height: 167.6 cm (5\' 6" )   Body mass index is 19.05 kg/m.   Physical Exam Vitals reviewed.  Constitutional:      General: She is not in acute distress.    Appearance: Normal appearance.  HENT:     Head: Normocephalic and atraumatic.  Eyes:     General: No scleral icterus.    Conjunctiva/sclera: Conjunctivae normal.  Pulmonary:     Effort: Pulmonary effort is normal. No respiratory distress.  Abdominal:     General: There is no distension.     Palpations: Abdomen is soft.     Tenderness: There is no abdominal tenderness.     Comments: No surgical scars.  Musculoskeletal:        General: Normal range of motion.  Skin:    General: Skin is warm and dry.  Neurological:     General: No focal deficit present.     Mental Status: She is alert and oriented to person, place, and time.            Labs, Imaging and Diagnostic Testing: IMPRESSION: 1. The pancreatic mass appears reduced in size, currently 3.1 by 1.3 cm, formerly up to 4.0 by 3.2 cm. However, the new duodenal stent in this region may be  changing the orientation of the mass. 2. The pancreatic duct caliber is substantially increased from prior, currently up to 1 cm in diameter, previously 0.3 cm. There is also new biliary dilatation. This may well be caused by the pancreatic mass, but the expandable duodenal stent which traverses the region of the ampulla may be contributory as  well. 3. A few bilateral lower lobe nodules are present, the largest measuring 6 by 5 mm in the right lower lobe, stable. Surveillance recommended. 4. Hypodense 1.6 cm left thyroid nodule. Recommend thyroid US. 5. Aortic atherosclerosis.       Assessment and Plan:   Assessment Diagnoses and all orders for this visit:   Pancreatic adenocarcinoma (CMS/HHS-HCC)   Biliary obstruction due to malignant neoplasm (CMS/HHS-HCC)     This is an 89 year old female with adenocarcinoma of the pancreatic head.  He has completed 6 cycles neoadjuvant gemcitabine/Abraxane, with a decrease in her CA 19-9 and no evidence of disease progression or metastases on imaging.  She has however developed elevated alkaline phosphatase and mild hyperbilirubinemia.  Her recent scans show intra and extrahepatic biliary ductal dilation, suggesting impending obstructive jaundice.  Her seventh cycle of chemotherapy was thus held.   I had a long discussion with the patient and her daughter today regarding surgery.  There is no evidence of vascular distortion or invasion by tumor on her imaging.  This appears to be resectable disease.  She is of an advanced age, however her performance status is excellent, and throughout chemotherapy.  I discussed the benefits and risks of a Whipple procedure, including the 15 to 20% risk of pancreatic fistula, risks of bile leak, GJ anastomotic leak, and a 15% risk of delayed gastric emptying, and the risks of cardiac event, respiratory failure, and renal failure.  I discussed that even though she is very fit for her age, she has less reserve to tolerate  a major complication.  I emphasized that the overall major complication rate for a Whipple procedure is 20 to 30%, and that while surgery is the only potentially curative treatment for her cancer it is also the highest risk treatment, and it is often not curative.  I discussed that the median recurrence is generally 18 to 20 months, with a 5-year survival of approximately 20% following resection.  Nevertheless, I do not feel that she is at prohibitively high risk for surgery, and even at her age, given her resectable disease and excellent performance status, I am willing to offer her resection.  The patient expressed understanding of all of this and she would like to proceed with surgery.  I will schedule her for surgery on November 18, which will be about a month after her last cycle of chemotherapy.  I have discussed her case with Dr. Peterson Ao got to, who will attempt placement of a common bile duct stent tentatively next week to prevent the development of jaundice.  If this is not successful, she will need to be referred to IR for PTC.  I am concerned she will develop severe jaundice prior to her scheduled surgery if she does not undergo biliary decompression before then.  All questions were answered today.  Will be scheduled for staging laparoscopy and Whipple procedure on November 18.  Sophronia Simas, MD [facility] Surgery General, Hepatobiliary and Pancreatic Surgery 10/30/23 5:18 PM

## 2023-11-04 ENCOUNTER — Ambulatory Visit (HOSPITAL_BASED_OUTPATIENT_CLINIC_OR_DEPARTMENT_OTHER): Payer: Medicare Other | Admitting: Anesthesiology

## 2023-11-04 ENCOUNTER — Ambulatory Visit (HOSPITAL_COMMUNITY): Payer: Medicare Other

## 2023-11-04 ENCOUNTER — Inpatient Hospital Stay (HOSPITAL_COMMUNITY)
Admission: AD | Admit: 2023-11-04 | Discharge: 2023-11-06 | DRG: 445 | Disposition: A | Discharge status: Home / Self Care | Payer: Medicare Other | Attending: Internal Medicine | Admitting: Internal Medicine

## 2023-11-04 ENCOUNTER — Ambulatory Visit (HOSPITAL_COMMUNITY): Payer: Medicare Other | Admitting: Anesthesiology

## 2023-11-04 ENCOUNTER — Other Ambulatory Visit: Payer: Self-pay

## 2023-11-04 ENCOUNTER — Encounter (HOSPITAL_COMMUNITY)
Admission: AD | Disposition: A | Discharge status: Home / Self Care | Payer: Self-pay | Source: Home / Self Care | Attending: Internal Medicine

## 2023-11-04 ENCOUNTER — Encounter (HOSPITAL_COMMUNITY): Payer: Self-pay | Admitting: Gastroenterology

## 2023-11-04 ENCOUNTER — Observation Stay (HOSPITAL_COMMUNITY): Payer: Medicare Other

## 2023-11-04 DIAGNOSIS — R935 Abnormal findings on diagnostic imaging of other abdominal regions, including retroperitoneum: Secondary | ICD-10-CM | POA: Diagnosis not present

## 2023-11-04 DIAGNOSIS — K831 Obstruction of bile duct: Secondary | ICD-10-CM | POA: Diagnosis not present

## 2023-11-04 DIAGNOSIS — Z434 Encounter for attention to other artificial openings of digestive tract: Secondary | ICD-10-CM | POA: Diagnosis not present

## 2023-11-04 DIAGNOSIS — K219 Gastro-esophageal reflux disease without esophagitis: Secondary | ICD-10-CM | POA: Diagnosis not present

## 2023-11-04 DIAGNOSIS — I160 Hypertensive urgency: Secondary | ICD-10-CM | POA: Diagnosis present

## 2023-11-04 DIAGNOSIS — D649 Anemia, unspecified: Secondary | ICD-10-CM | POA: Diagnosis not present

## 2023-11-04 DIAGNOSIS — E785 Hyperlipidemia, unspecified: Secondary | ICD-10-CM | POA: Diagnosis not present

## 2023-11-04 DIAGNOSIS — G540 Brachial plexus disorders: Secondary | ICD-10-CM | POA: Diagnosis present

## 2023-11-04 DIAGNOSIS — Z882 Allergy status to sulfonamides status: Secondary | ICD-10-CM | POA: Diagnosis not present

## 2023-11-04 DIAGNOSIS — I10 Essential (primary) hypertension: Secondary | ICD-10-CM | POA: Diagnosis present

## 2023-11-04 DIAGNOSIS — I4891 Unspecified atrial fibrillation: Secondary | ICD-10-CM

## 2023-11-04 DIAGNOSIS — C170 Malignant neoplasm of duodenum: Secondary | ICD-10-CM | POA: Diagnosis not present

## 2023-11-04 DIAGNOSIS — K269 Duodenal ulcer, unspecified as acute or chronic, without hemorrhage or perforation: Secondary | ICD-10-CM

## 2023-11-04 DIAGNOSIS — Z800 Family history of malignant neoplasm of digestive organs: Principal | ICD-10-CM

## 2023-11-04 DIAGNOSIS — Z79899 Other long term (current) drug therapy: Secondary | ICD-10-CM | POA: Diagnosis not present

## 2023-11-04 DIAGNOSIS — D630 Anemia in neoplastic disease: Secondary | ICD-10-CM | POA: Diagnosis present

## 2023-11-04 DIAGNOSIS — R932 Abnormal findings on diagnostic imaging of liver and biliary tract: Secondary | ICD-10-CM | POA: Diagnosis not present

## 2023-11-04 DIAGNOSIS — R7401 Elevation of levels of liver transaminase levels: Secondary | ICD-10-CM | POA: Diagnosis not present

## 2023-11-04 DIAGNOSIS — Z9221 Personal history of antineoplastic chemotherapy: Secondary | ICD-10-CM

## 2023-11-04 DIAGNOSIS — C250 Malignant neoplasm of head of pancreas: Secondary | ICD-10-CM | POA: Diagnosis not present

## 2023-11-04 DIAGNOSIS — C259 Malignant neoplasm of pancreas, unspecified: Secondary | ICD-10-CM | POA: Diagnosis present

## 2023-11-04 DIAGNOSIS — Z888 Allergy status to other drugs, medicaments and biological substances status: Secondary | ICD-10-CM | POA: Diagnosis not present

## 2023-11-04 DIAGNOSIS — I480 Paroxysmal atrial fibrillation: Secondary | ICD-10-CM | POA: Diagnosis not present

## 2023-11-04 DIAGNOSIS — R17 Unspecified jaundice: Secondary | ICD-10-CM | POA: Diagnosis not present

## 2023-11-04 HISTORY — PX: IR BILIARY DRAIN PLACEMENT WITH CHOLANGIOGRAM: IMG6043

## 2023-11-04 HISTORY — PX: ERCP: SHX5425

## 2023-11-04 LAB — CBC
HCT: 36.8 % (ref 36.0–46.0)
Hemoglobin: 11.9 g/dL — ABNORMAL LOW (ref 12.0–15.0)
MCH: 29.5 pg (ref 26.0–34.0)
MCHC: 32.3 g/dL (ref 30.0–36.0)
MCV: 91.3 fL (ref 80.0–100.0)
Platelets: 348 10*3/uL (ref 150–400)
RBC: 4.03 MIL/uL (ref 3.87–5.11)
RDW: 18.3 % — ABNORMAL HIGH (ref 11.5–15.5)
WBC: 6.6 10*3/uL (ref 4.0–10.5)
nRBC: 0 % (ref 0.0–0.2)

## 2023-11-04 LAB — COMPREHENSIVE METABOLIC PANEL
ALT: 220 U/L — ABNORMAL HIGH (ref 0–44)
AST: 191 U/L — ABNORMAL HIGH (ref 15–41)
Albumin: 3.3 g/dL — ABNORMAL LOW (ref 3.5–5.0)
Alkaline Phosphatase: 755 U/L — ABNORMAL HIGH (ref 38–126)
Anion gap: 11 (ref 5–15)
BUN: 14 mg/dL (ref 8–23)
CO2: 23 mmol/L (ref 22–32)
Calcium: 9.9 mg/dL (ref 8.9–10.3)
Chloride: 104 mmol/L (ref 98–111)
Creatinine, Ser: 0.69 mg/dL (ref 0.44–1.00)
GFR, Estimated: >60 mL/min (ref >60)
Glucose, Bld: 122 mg/dL — ABNORMAL HIGH (ref 70–99)
Potassium: 3.5 mmol/L (ref 3.5–5.1)
Sodium: 138 mmol/L (ref 135–145)
Total Bilirubin: 6 mg/dL — ABNORMAL HIGH (ref <1.2)
Total Protein: 6.2 g/dL — ABNORMAL LOW (ref 6.5–8.1)

## 2023-11-04 LAB — PROTIME-INR
INR: 1 (ref 0.8–1.2)
Prothrombin Time: 13.8 s (ref 11.4–15.2)

## 2023-11-04 SURGERY — ERCP, WITH INTERVENTION IF INDICATED
Anesthesia: General | Laterality: Bilateral

## 2023-11-04 MED ORDER — PROPOFOL 10 MG/ML IV BOLUS
INTRAVENOUS | Status: DC | PRN
  Administered 2023-11-04: 70 mg via INTRAVENOUS

## 2023-11-04 MED ORDER — CIPROFLOXACIN IN D5W 400 MG/200ML IV SOLN
INTRAVENOUS | Status: DC | PRN
  Administered 2023-11-04: 400 mg via INTRAVENOUS

## 2023-11-04 MED ORDER — ONDANSETRON HCL 4 MG/2ML IJ SOLN: 4.0000 mg | Freq: Four times a day (QID) | INTRAMUSCULAR | Status: DC | PRN

## 2023-11-04 MED ORDER — SODIUM CHLORIDE 0.9 % IV SOLN
INTRAVENOUS | Status: AC
  Filled 2023-11-04: qty 2

## 2023-11-04 MED ORDER — DEXAMETHASONE SODIUM PHOSPHATE 10 MG/ML IJ SOLN
INTRAMUSCULAR | Status: DC | PRN
  Administered 2023-11-04: 5 mg via INTRAVENOUS

## 2023-11-04 MED ORDER — FENTANYL CITRATE (PF) 100 MCG/2ML IJ SOLN
INTRAMUSCULAR | Status: DC | PRN
  Administered 2023-11-04 (×2): 50 ug via INTRAVENOUS

## 2023-11-04 MED ORDER — KETOROLAC TROMETHAMINE 15 MG/ML IJ SOLN
INTRAMUSCULAR | Status: AC | PRN
  Administered 2023-11-04: 15 mg via INTRAVENOUS

## 2023-11-04 MED ORDER — LIDOCAINE HCL 1 % IJ SOLN
20.0000 mL | Freq: Once | INTRAMUSCULAR | Status: AC
  Administered 2023-11-04: 13 mL via INTRADERMAL
  Filled 2023-11-04: qty 20

## 2023-11-04 MED ORDER — ENOXAPARIN SODIUM 40 MG/0.4ML IJ SOSY
40.0000 mg | PREFILLED_SYRINGE | INTRAMUSCULAR | Status: DC
  Filled 2023-11-04: qty 0.4

## 2023-11-04 MED ORDER — DICLOFENAC SUPPOSITORY 100 MG
RECTAL | Status: AC
  Filled 2023-11-04: qty 1

## 2023-11-04 MED ORDER — LACTATED RINGERS IV SOLN: INTRAVENOUS | Status: DC | PRN

## 2023-11-04 MED ORDER — SODIUM CHLORIDE 0.9 % IV SOLN
2.0000 g | INTRAVENOUS | Status: AC
  Administered 2023-11-04: 2 g via INTRAVENOUS
  Filled 2023-11-04: qty 2

## 2023-11-04 MED ORDER — SUCCINYLCHOLINE CHLORIDE 200 MG/10ML IV SOSY
PREFILLED_SYRINGE | INTRAVENOUS | Status: DC | PRN
  Administered 2023-11-04: 80 mg via INTRAVENOUS

## 2023-11-04 MED ORDER — MIDAZOLAM HCL 2 MG/2ML IJ SOLN
INTRAMUSCULAR | Status: AC | PRN
  Administered 2023-11-04: .5 mg via INTRAVENOUS
  Administered 2023-11-04: 1 mg via INTRAVENOUS
  Administered 2023-11-04 (×2): .5 mg via INTRAVENOUS

## 2023-11-04 MED ORDER — HYDRALAZINE HCL 20 MG/ML IJ SOLN: 10.0000 mg | INTRAMUSCULAR | Status: DC | PRN

## 2023-11-04 MED ORDER — FENTANYL CITRATE (PF) 100 MCG/2ML IJ SOLN
INTRAMUSCULAR | Status: AC
  Filled 2023-11-04: qty 2

## 2023-11-04 MED ORDER — SODIUM CHLORIDE 0.9 % IV SOLN: INTRAVENOUS | Status: DC

## 2023-11-04 MED ORDER — ONDANSETRON HCL 4 MG PO TABS: 4.0000 mg | ORAL_TABLET | Freq: Four times a day (QID) | ORAL | Status: DC | PRN

## 2023-11-04 MED ORDER — FENTANYL CITRATE (PF) 100 MCG/2ML IJ SOLN
INTRAMUSCULAR | Status: AC
  Filled 2023-11-04: qty 4

## 2023-11-04 MED ORDER — CIPROFLOXACIN IN D5W 400 MG/200ML IV SOLN
INTRAVENOUS | Status: AC
  Filled 2023-11-04: qty 200

## 2023-11-04 MED ORDER — ALBUTEROL SULFATE (2.5 MG/3ML) 0.083% IN NEBU: 2.5000 mg | INHALATION_SOLUTION | Freq: Four times a day (QID) | RESPIRATORY_TRACT | Status: DC | PRN

## 2023-11-04 MED ORDER — LIDOCAINE HCL 1 % IJ SOLN
INTRAMUSCULAR | Status: AC
Start: 2023-11-04 — End: ?
  Filled 2023-11-04: qty 20

## 2023-11-04 MED ORDER — SODIUM CHLORIDE 0.9 % IV SOLN
INTRAVENOUS | Status: AC | PRN
  Administered 2023-11-04: 200 mL/h via INTRAVENOUS

## 2023-11-04 MED ORDER — HYDROCODONE-ACETAMINOPHEN 5-325 MG PO TABS
1.0000 | ORAL_TABLET | Freq: Four times a day (QID) | ORAL | Status: AC | PRN
  Administered 2023-11-04 – 2023-11-05 (×2): 1 via ORAL
  Filled 2023-11-04 (×2): qty 1

## 2023-11-04 MED ORDER — SODIUM CHLORIDE 0.9% FLUSH
3.0000 mL | Freq: Two times a day (BID) | INTRAVENOUS | Status: DC
  Administered 2023-11-04 – 2023-11-06 (×4): 3 mL via INTRAVENOUS

## 2023-11-04 MED ORDER — LIDOCAINE 2% (20 MG/ML) 5 ML SYRINGE
INTRAMUSCULAR | Status: DC | PRN
  Administered 2023-11-04: 40 mg via INTRAVENOUS

## 2023-11-04 MED ORDER — ONDANSETRON HCL 4 MG/2ML IJ SOLN
INTRAMUSCULAR | Status: DC | PRN
  Administered 2023-11-04: 4 mg via INTRAVENOUS

## 2023-11-04 MED ORDER — MIDAZOLAM HCL 2 MG/2ML IJ SOLN
INTRAMUSCULAR | Status: AC
  Filled 2023-11-04: qty 4

## 2023-11-04 MED ORDER — DICLOFENAC SUPPOSITORY 100 MG
RECTAL | Status: DC | PRN
  Administered 2023-11-04: 100 mg via RECTAL

## 2023-11-04 MED ORDER — IOHEXOL 300 MG/ML  SOLN
50.0000 mL | Freq: Once | INTRAMUSCULAR | Status: AC | PRN
  Administered 2023-11-04: 15 mL

## 2023-11-04 MED ORDER — FENTANYL CITRATE (PF) 100 MCG/2ML IJ SOLN
INTRAMUSCULAR | Status: AC | PRN
  Administered 2023-11-04 (×2): 25 ug via INTRAVENOUS
  Administered 2023-11-04: 50 ug via INTRAVENOUS

## 2023-11-04 MED ORDER — GLUCAGON HCL RDNA (DIAGNOSTIC) 1 MG IJ SOLR
INTRAMUSCULAR | Status: AC
  Filled 2023-11-04: qty 1

## 2023-11-04 MED ORDER — LACTATED RINGERS IV BOLUS
1000.0000 mL | Freq: Once | INTRAVENOUS | Status: AC
  Administered 2023-11-04: 1000 mL via INTRAVENOUS

## 2023-11-04 MED ORDER — SODIUM CHLORIDE 0.9 % IV SOLN
INTRAVENOUS | Status: AC | PRN
  Administered 2023-11-04: 2 g via INTRAVENOUS

## 2023-11-04 MED ORDER — KETOROLAC TROMETHAMINE 30 MG/ML IJ SOLN
INTRAMUSCULAR | Status: AC
Start: 2023-11-04 — End: ?
  Filled 2023-11-04: qty 1

## 2023-11-04 NOTE — Procedures (Signed)
Interventional Radiology Procedure Note  Procedure: Image guided external biliary drain.  15F pigtail drain.  Complications: None  EBL: None Sample: Culture sent  Recommendations: - routine drain care, with record output, TID flushes with 5cc-10cc sterile saline flush - follow up Cx - will advance diet - prn's for ruq pain - VIR will need to consider another attempt at internalization after 24-48 decompression to allow decreased inflammation/edema  Signed,  Yvone Neu. Loreta Ave, DO, ABVM, RPVI

## 2023-11-04 NOTE — Anesthesia Procedure Notes (Signed)
Procedure Name: Intubation Date/Time: 11/04/2023 8:34 AM  Performed by: April Holding, CRNAPre-anesthesia Checklist: Patient identified, Emergency Drugs available, Suction available and Patient being monitored Patient Re-evaluated:Patient Re-evaluated prior to induction Oxygen Delivery Method: Circle System Utilized Preoxygenation: Pre-oxygenation with 100% oxygen Induction Type: IV induction Ventilation: Mask ventilation without difficulty Laryngoscope Size: Miller and 2 Grade View: Grade I Tube type: Oral Tube size: 7.0 mm Number of attempts: 1 Airway Equipment and Method: Stylet and Oral airway Placement Confirmation: ETT inserted through vocal cords under direct vision, positive ETCO2 and breath sounds checked- equal and bilateral Secured at: 21 cm Tube secured with: Tape Dental Injury: Teeth and Oropharynx as per pre-operative assessment

## 2023-11-04 NOTE — Anesthesia Preprocedure Evaluation (Signed)
Anesthesia Evaluation  Patient identified by MRN, date of birth, ID band Patient awake    Reviewed: Allergy & Precautions, NPO status , Patient's Chart, lab work & pertinent test results  History of Anesthesia Complications Negative for: history of anesthetic complications  Airway Mallampati: II  TM Distance: >3 FB Neck ROM: Full    Dental  (+) Dental Advisory Given   Pulmonary neg pulmonary ROS   Pulmonary exam normal        Cardiovascular hypertension, Pt. on medications (-) angina (-) Past MI, (-) Cardiac Stents and (-) CABG + dysrhythmias (PACs) Atrial Fibrillation + Valvular Problems/Murmurs (mild-to-moderate MR, moderate TR)  Rhythm:Irregular  HLD  TTE 10/14/2019: IMPRESSIONS     1. Left ventricular ejection fraction, by visual estimation, is 55 to  60%. The left ventricle has normal function. There is no left ventricular  hypertrophy.   2. Global right ventricle has normal systolic function.The right  ventricular size is normal. No increase in right ventricular wall  thickness.   3. Left atrial size was mildly dilated.   4. Right atrial size was mildly dilated.   5. Moderate calcification of the mitral valve leaflet(s).   6. Moderate mitral annular calcification.   7. Moderate thickening of the mitral valve leaflet(s).   8. The mitral valve is degenerative. Mild to moderate mitral valve  regurgitation.   9. MR not well interrogated Restricted posterior leaflet motion with  posteriorly directed jet can consider TEE to further evaluate.  10. The tricuspid valve is normal in structure. Tricuspid valve  regurgitation moderate.  11. The aortic valve is tricuspid Aortic valve regurgitation is mild to  moderate by color flow Doppler.  12. There is Moderate calcification of the aortic valve.  13. There is Moderate thickening of the aortic valve.  14. The pulmonic valve was grossly normal. Pulmonic valve regurgitation is   mild by color flow Doppler.  15. Aortic dilatation noted.  16. There is mild dilatation of the aortic root measuring 38 mm.  17. Moderately elevated pulmonary artery systolic pressure.     Neuro/Psych negative neurological ROS     GI/Hepatic Neg liver ROS,,,Pancreatic cancer with biliary obstruction   Endo/Other  negative endocrine ROS    Renal/GU negative Renal ROS     Musculoskeletal  (+) Arthritis , Osteoarthritis,    Abdominal   Peds  Hematology  (+) Blood dyscrasia (Hgb 7.6), anemia   Anesthesia Other Findings   Reproductive/Obstetrics                             Anesthesia Physical Anesthesia Plan  ASA: 3  Anesthesia Plan: General   Post-op Pain Management: Minimal or no pain anticipated   Induction: Intravenous  PONV Risk Score and Plan: 2 and Treatment may vary due to age or medical condition, Ondansetron and Dexamethasone  Airway Management Planned: Oral ETT  Additional Equipment: None  Intra-op Plan:   Post-operative Plan: Extubation in OR  Informed Consent: I have reviewed the patients History and Physical, chart, labs and discussed the procedure including the risks, benefits and alternatives for the proposed anesthesia with the patient or authorized representative who has indicated his/her understanding and acceptance.     Dental advisory given  Plan Discussed with: CRNA  Anesthesia Plan Comments:         Anesthesia Quick Evaluation

## 2023-11-04 NOTE — Transfer of Care (Signed)
Immediate Anesthesia Transfer of Care Note  Patient: ARIANA JUUL  Procedure(s) Performed: ENDOSCOPIC RETROGRADE CHOLANGIOPANCREATOGRAPHY (ERCP) (Bilateral)  Patient Location: Endoscopy Unit  Anesthesia Type:General  Level of Consciousness: drowsy  Airway & Oxygen Therapy: Patient Spontanous Breathing  Post-op Assessment: Report given to RN and Post -op Vital signs reviewed and stable  Post vital signs: Reviewed and stable  Last Vitals:  Vitals Value Taken Time  BP    Temp    Pulse 79 11/04/23 1009  Resp    SpO2 100 % 11/04/23 1009  Vitals shown include unfiled device data.  Last Pain:  Vitals:   11/04/23 0724  TempSrc: Temporal  PainSc: 0-No pain         Complications: No notable events documented.

## 2023-11-04 NOTE — Consult Note (Signed)
Chief Complaint: Patient was seen in consultation today for percutaneous transhepatic biliary drain placement at the request of Dr Berton Lan   Supervising Physician: Gilmer Mor  Patient Status: [facility] - In-pt  History of Present Illness: Wendy [name] is a 89 y.o. 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 Follows with Dr Eliot Ford--- planned for [place] 11/18 Denies jaundice--- although Dtr feels has slight jaundice of eyes recently TBili 6 today  ERCP today:  Major papilla not found secondary mass stent edema and friability Refer to IR for Percutaneous Transhepatic Biliary drain placement  Approved for procedure with Dr Ellamae Sia   Past Medical History:  Diagnosis Date   Arthritis    Family history of melanoma    Family history of pancreatic cancer    Family history of uterine cancer    Hyperlipidemia    Hypertension    pancreatic ca 06/2023   new dx   Vitamin D deficiency     Past Surgical History:  Procedure Laterality Date   BIOPSY  07/08/2023   Procedure: BIOPSY;  Surgeon: Vida Rigger, MD;  Location: [facility];  Service: Gastroenterology;;   CATARACT EXTRACTION Bilateral    DUODENAL STENT PLACEMENT N/A 07/10/2023   Procedure: DUODENAL STENT PLACEMENT;  Surgeon: Vida Rigger, MD;  Location: [facility];  Service: Gastroenterology;  Laterality: N/A;   ESOPHAGOGASTRODUODENOSCOPY (EGD) WITH PROPOFOL N/A 07/08/2023   Procedure: ESOPHAGOGASTRODUODENOSCOPY (EGD) WITH PROPOFOL;  Surgeon: Vida Rigger, MD;  Location: [facility];  Service: Gastroenterology;  Laterality: N/A;   ESOPHAGOGASTRODUODENOSCOPY (EGD) WITH PROPOFOL N/A 07/10/2023   Procedure: ESOPHAGOGASTRODUODENOSCOPY (EGD) WITH PROPOFOL;  Surgeon: Vida Rigger, MD;  Location: [facility];  Service: Gastroenterology;  Laterality:  N/A;   PORTACATH PLACEMENT N/A 08/04/2023   Procedure: INSERTION PORT-A-CATH;  Surgeon: [name], MD;  Location: [facility];  Service: General;  Laterality: N/A;    Allergies: Bactrim [sulfamethoxazole-trimethoprim] and Protonix [pantoprazole]  Medications: Prior to Admission medications   Medication Sig Start Date End Date Taking? Authorizing Provider  acetaminophen (TYLENOL) 500 MG tablet Take 1,000 mg by mouth every 6 (six) hours as needed for moderate pain (pain score 4-6) (ususally at night).   Yes [provider]  benazepril (LOTENSIN) 20 MG tablet Take 20 mg by mouth daily.   Yes [provider]  cyanocobalamin (VITAMIN B12) 1000 MCG tablet Take 1,000 mcg by mouth daily.   Yes [provider]  docusate sodium (COLACE) 100 MG capsule Take 1 capsule (100 mg total) by mouth 2 (two) times daily. 07/12/23  Yes Regalado, Belkys A, MD  famotidine (PEPCID) 40 MG tablet Take 40 mg by mouth daily. 07/18/23  Yes [provider]  ferrous sulfate 325 (65 FE) MG EC tablet Take 1 tablet (325 mg total) by mouth daily with breakfast. 07/12/23 11/04/23 Yes Regalado, Belkys A, MD  lidocaine-prilocaine (EMLA) cream Apply 1 Application topically as needed (apply to Port 1-2 hours prior to appointment). 07/24/23  Yes Ladene Artist, MD  Multiple Vitamin (MULTIVITAMIN WITH MINERALS) TABS tablet Take 1 tablet by mouth daily. 07/13/23  Yes Regalado, Belkys A, MD  ondansetron (ZOFRAN) 4 MG tablet Take 1 tablet (4 mg total) by mouth every 6 (six) hours as needed for nausea. 07/12/23  Yes Regalado, Belkys A, MD  prochlorperazine (COMPAZINE) 5 MG tablet Take 1 tablet (5 mg total) by mouth every 6 (  six) hours as needed for nausea or vomiting. 07/24/23  Yes Ladene Artist, MD  simvastatin (ZOCOR) 40 MG tablet Take 20 mg by mouth daily with supper.   Yes [provider]  polyethylene glycol (MIRALAX / GLYCOLAX) 17 g packet Take 17 g by mouth daily as needed for mild  constipation. 07/12/23   Regalado, Prentiss Bells, MD     Family History  Problem Relation Age of Onset   Uterine cancer Mother        dx in her 42s   Pancreatic cancer Father    Cerebral aneurysm Sister    Melanoma Sister 34 - 60       on her leg   Sarcoidosis Sister 27   Lung cancer Brother        smoker   Pancreatic cancer Niece 66 - 11       paternal half brother's daughter   Lung cancer Niece 49 - 9       smoker    Social History   Socioeconomic History   Marital status: Married    Spouse name: Not on file   Number of children: Not on file   Years of education: 1   Highest education level: Not on file  Occupational History   Not on file  Tobacco Use   Smoking status: Never   Smokeless tobacco: Never  Substance and Sexual Activity   Alcohol use: No   Drug use: No   Sexual activity: Not on file  Other Topics Concern   Not on file  Social History Narrative   Not on file   Social Determinants of Health   Financial Resource Strain: Low Risk  (08/05/2023)   Overall Financial Resource Strain (CARDIA)    Difficulty of Paying Living Expenses: Not hard at all  Food Insecurity: No Food Insecurity (11/04/2023)   Hunger Vital Sign    Worried About Running Out of Food in the Last Year: Never true    Ran Out of Food in the Last Year: Never true  Transportation Needs: No Transportation Needs (11/04/2023)   PRAPARE - Administrator, Civil Service (Medical): No    Lack of Transportation (Non-Medical): No  Physical Activity: Not on file  Stress: Not on file  Social Connections: Not on file    Review of Systems: A 12 point ROS discussed and pertinent positives are indicated in the HPI above.  All other systems are negative.  Review of Systems  Constitutional:  Positive for activity change. Negative for fatigue and fever.  Respiratory:  Negative for cough and shortness of breath.   Cardiovascular:  Negative for chest pain.  Gastrointestinal:  Negative for abdominal  pain.  Musculoskeletal:  Negative for back pain.  Psychiatric/Behavioral:  Negative for behavioral problems and confusion.     Vital Signs: BP (!) 160/79 (BP Location: Left Arm)   Pulse 79   Temp 97.6 F (36.4 C) (Oral)   Resp 20   SpO2 99%   Advance Care Plan: The advanced care plan/surrogate decision maker was discussed at the time of visit and documented in the medical record.    Physical Exam Vitals reviewed.  HENT:     Mouth/Throat:     Mouth: Mucous membranes are moist.  Cardiovascular:     Rate and Rhythm: Normal rate and regular rhythm.     Heart sounds: Normal heart sounds.  Pulmonary:     Effort: Pulmonary effort is normal.     Breath sounds: Normal breath  sounds. No wheezing.  Abdominal:     Palpations: Abdomen is soft.     Tenderness: There is abdominal tenderness.  Musculoskeletal:        General: Normal range of motion.  Skin:    General: Skin is warm.  Neurological:     Mental Status: She is alert and oriented to person, place, and time.  Psychiatric:        Behavior: Behavior normal.        Judgment: Judgment normal.     Imaging: DG C-Arm 1-60 Min-No Report  Result Date: 11/04/2023 Fluoroscopy was utilized by the requesting physician.  No radiographic interpretation.   DG C-Arm 1-60 Min-No Report  Result Date: 11/04/2023 Fluoroscopy was utilized by the requesting physician.  No radiographic interpretation.   CT CHEST ABDOMEN PELVIS W CONTRAST  Result Date: 10/10/2023 CLINICAL DATA:  Restaging pancreatic cancer. * Tracking Code: BO * EXAM: CT CHEST, ABDOMEN, AND PELVIS WITH CONTRAST TECHNIQUE: Multidetector CT imaging of the chest, abdomen and pelvis was performed following the standard protocol during bolus administration of intravenous contrast. RADIATION DOSE REDUCTION: This exam was performed according to the departmental dose-optimization program which includes automated exposure control, adjustment of the mA and/or kV according to patient  size and/or use of iterative reconstruction technique. CONTRAST:  OMNIPAQUE IOHEXOL 300 MG/ML  SOLN COMPARISON:  Multiple exams, including 07/11/2023 and 07/07/2023 FINDINGS: CT CHEST FINDINGS Cardiovascular: Right Port-A-Cath tip: SVC. Coronary, aortic arch, and branch vessel atherosclerotic vascular disease. Mediastinum/Nodes: Hypodense 1.6 cm left thyroid nodule. Recommend thyroid US (ref: J Am Coll Radiol. 2015 Feb;12(2): 143-50). No pathologic adenopathy. Lungs/Pleura: Biapical pleuroparenchymal scarring. A few bilateral lower lobe nodules are present. These appear similar to prior. The largest nodule measures 6 by 5 mm in the right lower lobe on image 127 of series 4, stable. Musculoskeletal: Mild thoracic spondylosis. CT ABDOMEN PELVIS FINDINGS Hepatobiliary: No metastatic lesion to the liver is identified. Mild intrahepatic biliary dilatation is present with common hepatic duct dilated up to 1.2 cm and a tapered irregular appearance in the distal common bile duct in the vicinity of the pancreatic head. Pancreas: Prominently dilated dorsal pancreatic duct substantially worsened from prior, currently up to 1 cm in diameter, previously 0.3 cm. Upper uptake bring at the ampulla with other serpentine fluid signal intensity lesions along the pancreatic head likely representing dilated ventral duct. This may well be caused by the pancreatic mass, but the expandable duodenal stent which traverses the region of the ampulla may be contributory as well. There is some distortion of the orientation of the pancreatic head and the mass due to the presence of a new expandable stent in the duodenum adjacent to mass. However, compensating for this, the pancreatic mass appears reduced in size, I measure at and about 3.1 by 1.3 cm on image 81 of series 2, formerly up to 4.0 by 3.2 cm. Spleen: Unremarkable Adrenals/Urinary Tract: Unremarkable Stomach/Bowel: As noted above there is an expandable duodenal stent traversing  the duodenal bulb and second portion of the duodenum, the stent is patent although there may be some epithelialization proximally. Stent caliber 2.4 cm. Vascular/Lymphatic: Atherosclerosis is present, including aortoiliac atherosclerotic disease. Narrowing of the proximal celiac trunk due to atheromatous vascular disease and median arcuate ligament. Narrowed proximal SMA nonspecific although soft plaque may be contributory. Neither vessel is overtly occluded. The portal vein, SMV, and splenic vein are patent without evidence of tumor invasion. No tumor directly around the SMA or celiac trunk. Reproductive: Unremarkable Other: No supplemental  non-categorized findings. Musculoskeletal: Prominent degenerative hip arthropathy bilaterally. Mild lumbar spondylosis and degenerative disc disease. IMPRESSION: 1. The pancreatic mass appears reduced in size, currently 3.1 by 1.3 cm, formerly up to 4.0 by 3.2 cm. However, the new duodenal stent in this region may be changing the orientation of the mass. 2. The pancreatic duct caliber is substantially increased from prior, currently up to 1 cm in diameter, previously 0.3 cm. There is also new biliary dilatation. This may well be caused by the pancreatic mass, but the expandable duodenal stent which traverses the region of the ampulla may be contributory as well. 3. A few bilateral lower lobe nodules are present, the largest measuring 6 by 5 mm in the right lower lobe, stable. Surveillance recommended. 4. Hypodense 1.6 cm left thyroid nodule. Recommend thyroid US. 5. Aortic atherosclerosis. Aortic Atherosclerosis (ICD10-I70.0). Electronically Signed   By: Gaylyn Rong M.D.   On: 10/10/2023 17:21    Labs:  CBC: Recent Labs    09/16/23 0832 09/30/23 1028 10/13/23 0856 10/28/23 0805  WBC 4.6 4.8 4.5 5.5  HGB 10.8* 10.8* 10.4* 9.8*  HCT 33.0* 33.2* 32.4* 30.6*  PLT 268 236 247 290    COAGS: No results for input(s): "INR", "APTT" in the last 8760  hours.  BMP: Recent Labs    09/30/23 1028 10/13/23 0856 10/28/23 0805 11/04/23 0729  NA 139 138 136 138  K 4.0 3.9 3.9 3.5  CL 107 104 104 104  CO2 25 26 26 23   GLUCOSE 143* 120* 109* 122*  BUN 28* 24* 23 14  CALCIUM 9.6 9.8 10.1 9.9  CREATININE 0.63 0.68 0.52 0.69  GFRNONAA >60 >60 >60 >60    LIVER FUNCTION TESTS: Recent Labs    09/30/23 1028 10/13/23 0856 10/28/23 0805 11/04/23 0729  BILITOT 0.4 0.4 1.4* 6.0*  AST 21 69* 147* 191*  ALT 19 85* 190* 220*  ALKPHOS 75 229* 785* 755*  PROT 6.2* 6.3* 6.0* 6.2*  ALBUMIN 3.9 3.8 4.0 3.3*    TUMOR MARKERS: No results for input(s): "AFPTM", "CEA", "CA199", "CHROMGRNA" in the last 8760 hours.  Assessment and Plan:  Scheduled for percutaneous transhepatic biliary drain placement Risks and benefits of percutaneous transhepatic biliary drain placement discussed with the patient including, but not limited to bleeding, infection which may lead to sepsis or even death and damage to adjacent structures.  This interventional procedure involves the use of X-rays and because of the nature of the planned procedure, it is possible that we will have prolonged use of X-ray fluoroscopy.  Potential radiation risks to you include (but are not limited to) the following: - A slightly elevated risk for cancer  several years later in life. This risk is typically less than 0.5% percent. This risk is low in comparison to the normal incidence of human cancer, which is 33% for women and 50% for men according to the American Cancer Society. - Radiation induced injury can include skin redness, resembling a rash, tissue breakdown / ulcers and hair loss (which can be temporary or permanent).   The likelihood of either of these occurring depends on the difficulty of the procedure and whether you are sensitive to radiation due to previous procedures, disease, or genetic conditions.   IF your procedure requires a prolonged use of radiation, you will be  notified and given written instructions for further action.  It is your responsibility to monitor the irradiated area for the 2 weeks following the procedure and to notify your physician if you are  concerned that you have suffered a radiation induced injury.    All of the patient's questions were answered, patient is agreeable to proceed.  Consent signed and in chart.  Thank you for this interesting consult.  I greatly enjoyed meeting Wendy [name] and look forward to participating in their care.  A copy of this report was sent to the requesting provider on this date.  Electronically Signed: Robet Leu, PA-C 11/04/2023, 12:58 PM   I spent a total of 20 Minutes    in face to face in clinical consultation, greater than 50% of which was counseling/coordinating care for Biliary drain

## 2023-11-04 NOTE — Discharge Instructions (Signed)

## 2023-11-04 NOTE — Op Note (Signed)
[facility] Patient Name: Wendy [name] Procedure Date : 11/04/2023 MRN: [id number] Attending MD: Vida Rigger , MD, [id number] Date of Birth: 12/09/1934 CSN: [id number] Age: 89 Admit Type: Ambulatory Procedure:                ERCP Indications:              Abnormal abdominal CT, Jaundice, Malignant tumor of                            the head of pancreas Providers:                Vida Rigger, MD, Glory Rosebush, RN, Harrington Challenger,                            Technician Referring MD:              Medicines:                General Anesthesia Complications:            No immediate complications. Estimated Blood Loss:     Estimated blood loss was minimal. Procedure:                Pre-Anesthesia Assessment:                           - Prior to the procedure, a History and Physical                            was performed, and patient medications and                            allergies were reviewed. The patient's tolerance of                            previous anesthesia was also reviewed. The risks                            and benefits of the procedure and the sedation                            options and risks were discussed with the patient.                            All questions were answered, and informed consent                            was obtained. Prior Anticoagulants: The patient has                            taken no anticoagulant or antiplatelet agents. ASA                            Grade Assessment: III - A patient with severe  systemic disease. After reviewing the risks and                            benefits, the patient was deemed in satisfactory                            condition to undergo the procedure.                           After obtaining informed consent, the scope was                            passed under direct vision. Throughout the                            procedure, the patient's blood pressure, pulse, and                             oxygen saturations were monitored continuously. The                            [id number]-[id number] ([id number]) Olympus duodenoscope was                            introduced through the mouth, and used to inject                            contrast into and used to locate the major papilla.                            The ERCP was technically difficult and complex due                            to challenging cannulation because of abnormal                            anatomy. Successful completion of the procedure was                            aided by performing the maneuvers documented                            (below) in this report. The patient tolerated the                            procedure well. We began the procedure with the                            side-viewing scope but were unsuccessful in finding                            the ampulla even though we were able to enter the  stent and advanced to the bottom and used                            fluoroscopy guidance to try to estimate the proper                            positioning and we switched to the upper endoscope                            and at 1 point with rolling her on her side we                            thought we may have located ampulla but we were                            unable to get adequate position to probe with the                            needle-knife sphincterotome and at 1 point we saw a                            possible papilla and tried to probe it with the                            wire through this needle-knife but were                            unsuccessful in advancing the wire and we did retry                            the side-viewing scope a second time with her on                            her left side without additional attempts at                            cannulation and we went back to the straight                            viewing  scope 1 more time but were unable to                            maintain adequate position to attempt and                            needle-knife over 1 area where we thought was                            possibly the ampulla so we elected to terminate the  procedure at this point Scope In: Scope Out: Findings:      The upper GI tract was traversed under direct vision with      detailed examination using both the side-viewing scope x 2 in the upper       endoscope x 2 as above. The examined esophagus was normal. The entire       examined stomach was normal. A standard esophagogastroduodenoscopy scope       was used for the examination of the upper gastrointestinal tract as       well. The scope was passed under direct vision through the upper GI       tract. A previously placed metal duodenal stent was seen in the duodenal       bulb, in the first portion of the duodenum and in the second portion of       the duodenum. A standard esophagogastroduodenoscopy scope was used for       the examination of the upper gastrointestinal tract. The scope was       passed under direct vision through the upper GI tract. Few non-bleeding       cratered duodenal ulcers were found in the first portion of the duodenum       and in the second portion of the duodenum. A small infiltrative and       ulcerated mass with no bleeding was found in the first portion of the       duodenum. The major papilla was not found Due to mass stent edema and       friability. There was no bleeding at the beginning of the procedure and       with the first endoscopy however the duodenum and mass lesion were       friable and did ooze with direct contact Impression:               - Normal esophagus.                           - Normal stomach.                           - Metal duodenal stent in the duodenum.                           - Non-bleeding duodenal ulcers.                           -  Malignant duodenal mass.                           - The major papilla was not found Due to mass stent                            edema and friability. Recommendation:           - Refer to an interventional radiologist today they                            will let me know their availability and will need  to admit her overnight after the procedure.                           - Clear liquid diet for 2 hours if PTC procedure                            not done today and then may slowly advance diet as                            tolerated.                           - Continue present medications.                           - Return to GI clinic PRN.                           - Telephone GI clinic if symptomatic PRN. 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 All rights reserved. The codes documented in this report are preliminary and upon coder review may  be revised to  meet current compliance requirements. Vida Rigger, MD 11/04/2023 10:27:50 AM This report has been signed electronically. Number of Addenda: 0

## 2023-11-04 NOTE — H&P (Signed)
History and Physical    Patient: Wendy [name] WJX:[id number] DOB: 02-05-1934 DOA: 11/04/2023 DOS: the patient was seen and examined on 11/04/2023 PCP: [name], MD  Patient coming from: Endoscopy  Chief Complaint: Yellowing of skin  HPI: Wendy [name] is a 89 y.o. female with medical history significant of hypertension, hyperlipidemia, atrial fibrillation not on anticoagulation, and pancreatic cancer.  She initially presented for gastric outlet obstruction in July and was found to have a large mass in the head of the pancreas.  Patient went placement of a duodenal stent and biopsies confirmed adenocarcinoma.  She has been off of Xarelto since that time.  [name] had completed neoadjuvant chemotherapy with gemcitabine/Abraxane with her last cycle on October 14.  She was planned to have chemotherapy 1 week ago, but after doing labs noted that her liver enzymes were elevated.  Patient had not really noticed any yellowing of her skin or eyes until last 2 days or so.  At this time she reports having some mild left-sided back pain.  Denies having any significant fever, cough, shortness of breath, nausea, vomiting, or itching.  Lab work from 10/29 had noted alkaline phosphatase 785, AST 147, ALT 190, total bilirubin 1.4. Dr. Ewing Schlein of gastroenterology took patient for ERCP and attempted placement of biliary stent, but was unsuccessful.  Patient had been given 400 mg of the ciprofloxacin IV prior to the attempt at the procedure.  IR was consulted for need of percutaneous transhepatic biliary drain.  She had been given Cefoxitin IV.  Patient makes note plan is for her to undergo Whipple on November 18 with Dr. [name].  Review of Systems: As mentioned in the history of present illness. All other systems reviewed and are negative. Past Medical History:  Diagnosis Date   Arthritis    Family history of melanoma    Family history of pancreatic cancer    Family history of uterine cancer     Hyperlipidemia    Hypertension    pancreatic ca 06/2023   new dx   Vitamin D deficiency    Past Surgical History:  Procedure Laterality Date   BIOPSY  07/08/2023   Procedure: BIOPSY;  Surgeon: Vida Rigger, MD;  Location: [facility];  Service: Gastroenterology;;   CATARACT EXTRACTION Bilateral    DUODENAL STENT PLACEMENT N/A 07/10/2023   Procedure: DUODENAL STENT PLACEMENT;  Surgeon: Vida Rigger, MD;  Location: [facility];  Service: Gastroenterology;  Laterality: N/A;   ESOPHAGOGASTRODUODENOSCOPY (EGD) WITH PROPOFOL N/A 07/08/2023   Procedure: ESOPHAGOGASTRODUODENOSCOPY (EGD) WITH PROPOFOL;  Surgeon: Vida Rigger, MD;  Location: [facility];  Service: Gastroenterology;  Laterality: N/A;   ESOPHAGOGASTRODUODENOSCOPY (EGD) WITH PROPOFOL N/A 07/10/2023   Procedure: ESOPHAGOGASTRODUODENOSCOPY (EGD) WITH PROPOFOL;  Surgeon: Vida Rigger, MD;  Location: [facility];  Service: Gastroenterology;  Laterality: N/A;   PORTACATH PLACEMENT N/A 08/04/2023   Procedure: INSERTION PORT-A-CATH;  Surgeon: [name], MD;  Location: [facility];  Service: General;  Laterality: N/A;   Social History:  reports that she has never smoked. She has never used smokeless tobacco. She reports that she does not drink alcohol and does not use drugs.  Allergies  Allergen Reactions   Bactrim [Sulfamethoxazole-Trimethoprim] Nausea And Vomiting and Other (See Comments)    "Knocked her for a loop"- dizziness and weakness, also   Protonix [Pantoprazole] Rash    Family History  Problem Relation Age of Onset   Uterine cancer Mother        dx in her 67s   Pancreatic cancer  Father    Cerebral aneurysm Sister    Melanoma Sister 29 - 4       on her leg   Sarcoidosis Sister 51   Lung cancer Brother        smoker   Pancreatic cancer Niece 45 - 28       paternal half brother's daughter   Lung cancer Niece 71 - 19       smoker    Prior to Admission medications   Medication Sig Start Date End Date Taking?  Authorizing Provider  acetaminophen (TYLENOL) 500 MG tablet Take 500-1,000 mg by mouth every 6 (six) hours as needed for moderate pain (pain score 4-6) (ususally at night).   Yes [provider]  benazepril (LOTENSIN) 20 MG tablet Take 20 mg by mouth daily.   Yes [provider]  cyanocobalamin (VITAMIN B12) 1000 MCG tablet Take 1,000 mcg by mouth daily.   Yes [provider]  docusate sodium (COLACE) 100 MG capsule Take 1 capsule (100 mg total) by mouth 2 (two) times daily. 07/12/23  Yes Regalado, Belkys A, MD  famotidine (PEPCID) 40 MG tablet Take 40 mg by mouth daily. 07/18/23  Yes [provider]  ferrous sulfate 325 (65 FE) MG EC tablet Take 1 tablet (325 mg total) by mouth daily with breakfast. 07/12/23 11/04/23 Yes Regalado, Belkys A, MD  lidocaine-prilocaine (EMLA) cream Apply 1 Application topically as needed (apply to Port 1-2 hours prior to appointment). 07/24/23  Yes Ladene Artist, MD  Multiple Vitamin (MULTIVITAMIN WITH MINERALS) TABS tablet Take 1 tablet by mouth daily. 07/13/23  Yes Regalado, Belkys A, MD  ondansetron (ZOFRAN) 4 MG tablet Take 1 tablet (4 mg total) by mouth every 6 (six) hours as needed for nausea. 07/12/23  Yes Regalado, Belkys A, MD  polyethylene glycol (MIRALAX / GLYCOLAX) 17 g packet Take 17 g by mouth daily as needed for mild constipation. 07/12/23  Yes Regalado, Belkys A, MD  prochlorperazine (COMPAZINE) 5 MG tablet Take 1 tablet (5 mg total) by mouth every 6 (six) hours as needed for nausea or vomiting. 07/24/23  Yes Ladene Artist, MD  simvastatin (ZOCOR) 40 MG tablet Take 20 mg by mouth daily with supper.   Yes [provider]    Physical Exam: Vitals:   11/04/23 1010 11/04/23 1020 11/04/23 1030 11/04/23 1040  BP: (!) 133/53 (!) 144/58 (!) 143/68 (!) 149/68  Pulse: 74 66 75 75  Resp: 13 13 19 19   Temp:      TempSrc:      SpO2: 100% 99% 100% 100%   Constitutional: Thin elderly female who appears ill, but  currently in no acute distress at this time. Eyes: PERRL, scleral icterus present ENMT: Mucous membranes are moist.  Fair dentition Neck: normal, supple Respiratory: clear to auscultation bilaterally, no wheezing, no crackles. Normal respiratory effort. No accessory muscle use.  Cardiovascular: Regular rate and rhythm, no murmurs / rubs / gallops. No extremity edema. Abdomen: no tenderness, no masses palpated. Bowel sounds positive.  Musculoskeletal: no clubbing / cyanosis. No joint deformity upper and lower extremities. Good ROM, no contractures. Normal muscle tone.  Skin: no rashes, lesions, ulcers. No induration Neurologic: CN 2-12 grossly intact. Sensation intact, DTR normal. Strength 5/5 in all 4.  Psychiatric: Normal judgment and insight. Alert and oriented x 3. Normal mood.   Data Reviewed:  Reviewed labs, imaging, and records as noted above in HPI  Assessment and Plan:  Pancreatic cancer  Biliary obstruction Patient presents  with complaints of yellowing of skin over the last 2 days.  Initially diagnosed with poorly differentiated adenocarcinoma of the pancreas back in July of this year.  Require duodenal stent placement by gastroenterology at that time.  Labs today noted alkaline phosphatase 785, AST 191, ALT 220, total bilirubin 6.2.  Dr. Ewing Schlein had taken patient for ERCP, but biliary stent placement was unsuccessful.  IR has been consulted for need of percutaneouss transhepatic biliary drain.  Patient received ciprofloxacin and subsequentlyCefoxitin IV prior to the procedures today.  Patient is scheduled for approximately an Whipple procedure with Dr. [name] of surgery on November 18. -Admit to medical telemetry bed -N.p.o. for need of procedure.  Orders placed to advance diet following procedure -Oxycodone/IV fentanyl as needed for moderate to severe pain respectively -Appreciate IR consultative services -Notifications of admission sent to Dr. [name] of surgery and Dr. Truett Perna of  oncology  Hypertensive urgency On admission blood pressures elevated up to 183/108. -Continue benazepril -Hydralazine IV as needed for elevated blood pressures  Paroxysmal atrial fibrillation Patient appears currently to be in normal sinus rhythm.  Has been off of Xarelto since initial diagnosis back in July. -Continue to hold anticoagulation at this time  Normocytic anemia Hemoglobin 11.9 g/dL.  This appears improved from prior -Continue to monitor  Hyperlipidemia -Resume simvastatin when medically appropriate  GERD -Continue Pepcid  DVT prophylaxis: Lovenox Advance Care Planning:   Code Status: Full Code   Consults: Gastroenterology and IR  Family Communication: Patient daughter updated at bedside  Severity of Illness: The appropriate patient status for this patient is OBSERVATION. Observation status is judged to be reasonable and necessary in order to provide the required intensity of service to ensure the patient's safety. The patient's presenting symptoms, physical exam findings, and initial radiographic and laboratory data in the context of their medical condition is felt to place them at decreased risk for further clinical deterioration. Furthermore, it is anticipated that the patient will be medically stable for discharge from the hospital within 2 midnights of admission.   Author: Clydie Braun, MD 11/04/2023 10:48 AM  For on call review [email or web address].

## 2023-11-04 NOTE — Plan of Care (Signed)

## 2023-11-04 NOTE — Anesthesia Postprocedure Evaluation (Signed)
Anesthesia Post Note  Patient: Wendy [name]  Procedure(s) Performed: ENDOSCOPIC RETROGRADE CHOLANGIOPANCREATOGRAPHY (ERCP) (Bilateral)     Patient location during evaluation: PACU Anesthesia Type: General Level of consciousness: awake and alert Pain management: pain level controlled Vital Signs Assessment: post-procedure vital signs reviewed and stable Respiratory status: spontaneous breathing, nonlabored ventilation, respiratory function stable and patient connected to nasal cannula oxygen Cardiovascular status: blood pressure returned to baseline and stable Postop Assessment: no apparent nausea or vomiting Anesthetic complications: no  No notable events documented.  Last Vitals:  Vitals:   11/04/23 1100 11/04/23 1122  BP: (!) 148/77 (!) 160/79  Pulse: 82 79  Resp: 16 20  Temp:  36.4 C  SpO2: 100% 99%    Last Pain:  Vitals:   11/04/23 1122  TempSrc: Oral  PainSc:                  [name]

## 2023-11-04 NOTE — Progress Notes (Signed)
Wendy [name] 8:20 AM  Subjective: Patient doing well without any new complaints since she was seen recently in the office and maybe she is a little more jaundiced has no pain no problems over the weekend  Objective: Vital signs stable afebrile no acute distress exam please see preassessment evaluation chemistries pending CT reviewed  Assessment: Obstructive jaundice  Plan: We again rediscussed the procedure and will attempt at placing a biliary stent through her existing duodenal stent with anesthesia assistance  [facility]  office [phone] After 5PM or if no answer call [phone]

## 2023-11-05 DIAGNOSIS — Z9221 Personal history of antineoplastic chemotherapy: Secondary | ICD-10-CM | POA: Diagnosis not present

## 2023-11-05 DIAGNOSIS — Z888 Allergy status to other drugs, medicaments and biological substances status: Secondary | ICD-10-CM | POA: Diagnosis not present

## 2023-11-05 DIAGNOSIS — Z882 Allergy status to sulfonamides status: Secondary | ICD-10-CM | POA: Diagnosis not present

## 2023-11-05 DIAGNOSIS — Z79899 Other long term (current) drug therapy: Secondary | ICD-10-CM | POA: Diagnosis not present

## 2023-11-05 DIAGNOSIS — R17 Unspecified jaundice: Secondary | ICD-10-CM | POA: Diagnosis not present

## 2023-11-05 DIAGNOSIS — C170 Malignant neoplasm of duodenum: Secondary | ICD-10-CM | POA: Diagnosis present

## 2023-11-05 DIAGNOSIS — I480 Paroxysmal atrial fibrillation: Secondary | ICD-10-CM | POA: Diagnosis present

## 2023-11-05 DIAGNOSIS — K219 Gastro-esophageal reflux disease without esophagitis: Secondary | ICD-10-CM | POA: Diagnosis present

## 2023-11-05 DIAGNOSIS — C259 Malignant neoplasm of pancreas, unspecified: Secondary | ICD-10-CM | POA: Diagnosis present

## 2023-11-05 DIAGNOSIS — K831 Obstruction of bile duct: Secondary | ICD-10-CM | POA: Diagnosis present

## 2023-11-05 DIAGNOSIS — I160 Hypertensive urgency: Secondary | ICD-10-CM | POA: Diagnosis present

## 2023-11-05 DIAGNOSIS — R7401 Elevation of levels of liver transaminase levels: Secondary | ICD-10-CM | POA: Diagnosis not present

## 2023-11-05 DIAGNOSIS — D630 Anemia in neoplastic disease: Secondary | ICD-10-CM | POA: Diagnosis present

## 2023-11-05 DIAGNOSIS — R935 Abnormal findings on diagnostic imaging of other abdominal regions, including retroperitoneum: Secondary | ICD-10-CM | POA: Diagnosis not present

## 2023-11-05 DIAGNOSIS — I10 Essential (primary) hypertension: Secondary | ICD-10-CM | POA: Diagnosis present

## 2023-11-05 DIAGNOSIS — E785 Hyperlipidemia, unspecified: Secondary | ICD-10-CM | POA: Diagnosis present

## 2023-11-05 DIAGNOSIS — Z800 Family history of malignant neoplasm of digestive organs: Secondary | ICD-10-CM | POA: Diagnosis not present

## 2023-11-05 DIAGNOSIS — C250 Malignant neoplasm of head of pancreas: Secondary | ICD-10-CM | POA: Diagnosis present

## 2023-11-05 DIAGNOSIS — R932 Abnormal findings on diagnostic imaging of liver and biliary tract: Secondary | ICD-10-CM | POA: Diagnosis not present

## 2023-11-05 DIAGNOSIS — Z434 Encounter for attention to other artificial openings of digestive tract: Secondary | ICD-10-CM | POA: Diagnosis not present

## 2023-11-05 DIAGNOSIS — K269 Duodenal ulcer, unspecified as acute or chronic, without hemorrhage or perforation: Secondary | ICD-10-CM | POA: Diagnosis present

## 2023-11-05 DIAGNOSIS — G540 Brachial plexus disorders: Secondary | ICD-10-CM | POA: Diagnosis present

## 2023-11-05 LAB — CBC
HCT: 31.8 % — ABNORMAL LOW (ref 36.0–46.0)
Hemoglobin: 10.2 g/dL — ABNORMAL LOW (ref 12.0–15.0)
MCH: 29.2 pg (ref 26.0–34.0)
MCHC: 32.1 g/dL (ref 30.0–36.0)
MCV: 91.1 fL (ref 80.0–100.0)
Platelets: 295 10*3/uL (ref 150–400)
RBC: 3.49 MIL/uL — ABNORMAL LOW (ref 3.87–5.11)
RDW: 18.4 % — ABNORMAL HIGH (ref 11.5–15.5)
WBC: 6.9 10*3/uL (ref 4.0–10.5)
nRBC: 0 % (ref 0.0–0.2)

## 2023-11-05 LAB — COMPREHENSIVE METABOLIC PANEL
ALT: 185 U/L — ABNORMAL HIGH (ref 0–44)
AST: 140 U/L — ABNORMAL HIGH (ref 15–41)
Albumin: 2.9 g/dL — ABNORMAL LOW (ref 3.5–5.0)
Alkaline Phosphatase: 658 U/L — ABNORMAL HIGH (ref 38–126)
Anion gap: 8 (ref 5–15)
BUN: 24 mg/dL — ABNORMAL HIGH (ref 8–23)
CO2: 19 mmol/L — ABNORMAL LOW (ref 22–32)
Calcium: 9.6 mg/dL (ref 8.9–10.3)
Chloride: 106 mmol/L (ref 98–111)
Creatinine, Ser: 0.93 mg/dL (ref 0.44–1.00)
GFR, Estimated: 59 mL/min — ABNORMAL LOW (ref >60)
Glucose, Bld: 137 mg/dL — ABNORMAL HIGH (ref 70–99)
Potassium: 4.2 mmol/L (ref 3.5–5.1)
Sodium: 133 mmol/L — ABNORMAL LOW (ref 135–145)
Total Bilirubin: 2.6 mg/dL — ABNORMAL HIGH (ref <1.2)
Total Protein: 5.6 g/dL — ABNORMAL LOW (ref 6.5–8.1)

## 2023-11-05 MED ORDER — SODIUM CHLORIDE 0.9 % IV SOLN
2.0000 g | INTRAVENOUS | Status: AC
  Administered 2023-11-06: 2 g via INTRAVENOUS
  Filled 2023-11-05: qty 2

## 2023-11-05 MED ORDER — HYDROCODONE-ACETAMINOPHEN 5-325 MG PO TABS
1.0000 | ORAL_TABLET | Freq: Four times a day (QID) | ORAL | Status: DC | PRN
  Administered 2023-11-05: 1 via ORAL
  Filled 2023-11-05: qty 1

## 2023-11-05 MED ORDER — ENOXAPARIN SODIUM 40 MG/0.4ML IJ SOSY: 40.0000 mg | PREFILLED_SYRINGE | INTRAMUSCULAR | Status: DC

## 2023-11-05 MED FILL — Fentanyl Citrate Preservative Free (PF) Inj 100 MCG/2ML: INTRAMUSCULAR | Qty: 2 | Status: AC

## 2023-11-05 MED FILL — Fentanyl Citrate Preservative Free (PF) Inj 100 MCG/2ML: INTRAMUSCULAR | Qty: 3 | Status: AC

## 2023-11-05 NOTE — Plan of Care (Signed)

## 2023-11-05 NOTE — Progress Notes (Signed)
Chief Complaint: Admit with biliary obstruction, SP ERCP and SP PTC with externalized drainage  Supervising Physician: Gilmer Mor  Patient Status: [facility] - In-pt  History of Present Illness: Wendy [name] is a 89 y.o. female admitted POD 1 from both ERCP and PTC with externalized drainage.   She reports feeling better this am, stating that her prescribed norco helped.  She asked for this for back pain which is better.   Drainage from the externalized biliary drain is recorded as from 6pm through this am.  On inspection at the bed there is another 100cc of dark bile in the bag, so greater than 1L since last 6pm.   Labs this am: WBC: 6.9 H/H: 10.2/31.8 Platelets 295  Na: 133 Cr: 0.93 BUN 24 Albumin: 2.9 AST: 140 ALT: 185 Alk phos: 658 Tbili: 2.6, decreased from 6.0 eGFR: 59  She tells me that her husband has a cardiac cath today as outpt, which is definitely a stressor for her.   Past Medical History:  Diagnosis Date   Arthritis    Family history of melanoma    Family history of pancreatic cancer    Family history of uterine cancer    Hyperlipidemia    Hypertension    pancreatic ca 06/2023   new dx   Vitamin D deficiency     Past Surgical History:  Procedure Laterality Date   BIOPSY  07/08/2023   Procedure: BIOPSY;  Surgeon: Vida Rigger, MD;  Location: [facility];  Service: Gastroenterology;;   CATARACT EXTRACTION Bilateral    DUODENAL STENT PLACEMENT N/A 07/10/2023   Procedure: DUODENAL STENT PLACEMENT;  Surgeon: Vida Rigger, MD;  Location: [facility];  Service: Gastroenterology;  Laterality: N/A;   ESOPHAGOGASTRODUODENOSCOPY (EGD) WITH PROPOFOL N/A 07/08/2023   Procedure: ESOPHAGOGASTRODUODENOSCOPY (EGD) WITH PROPOFOL;  Surgeon: Vida Rigger, MD;  Location: [facility];  Service: Gastroenterology;  Laterality: N/A;   ESOPHAGOGASTRODUODENOSCOPY (EGD) WITH PROPOFOL N/A 07/10/2023   Procedure: ESOPHAGOGASTRODUODENOSCOPY (EGD) WITH PROPOFOL;   Surgeon: Vida Rigger, MD;  Location: [facility];  Service: Gastroenterology;  Laterality: N/A;   IR BILIARY DRAIN PLACEMENT WITH CHOLANGIOGRAM  11/04/2023   PORTACATH PLACEMENT N/A 08/04/2023   Procedure: INSERTION PORT-A-CATH;  Surgeon: [name], MD;  Location: [facility];  Service: General;  Laterality: N/A;    Allergies: Bactrim [sulfamethoxazole-trimethoprim] and Protonix [pantoprazole]  Medications: Prior to Admission medications   Medication Sig Start Date End Date Taking? Authorizing Provider  acetaminophen (TYLENOL) 500 MG tablet Take 1,000 mg by mouth every 6 (six) hours as needed for moderate pain (pain score 4-6) (ususally at night).   Yes [provider]  benazepril (LOTENSIN) 20 MG tablet Take 20 mg by mouth daily.   Yes [provider]  cyanocobalamin (VITAMIN B12) 1000 MCG tablet Take 1,000 mcg by mouth daily.   Yes [provider]  docusate sodium (COLACE) 100 MG capsule Take 1 capsule (100 mg total) by mouth 2 (two) times daily. 07/12/23  Yes Regalado, Belkys A, MD  famotidine (PEPCID) 40 MG tablet Take 40 mg by mouth daily. 07/18/23  Yes [provider]  ferrous sulfate 325 (65 FE) MG EC tablet Take 1 tablet (325 mg total) by mouth daily with breakfast. 07/12/23 11/04/23 Yes Regalado, Belkys A, MD  lidocaine-prilocaine (EMLA) cream Apply 1 Application topically as needed (apply to Port 1-2 hours prior to appointment). 07/24/23  Yes Ladene Artist, MD  Multiple Vitamin (MULTIVITAMIN WITH MINERALS) TABS tablet Take 1 tablet by mouth daily. 07/13/23  Yes Regalado,  Belkys A, MD  ondansetron (ZOFRAN) 4 MG tablet Take 1 tablet (4 mg total) by mouth every 6 (six) hours as needed for nausea. 07/12/23  Yes Regalado, Belkys A, MD  prochlorperazine (COMPAZINE) 5 MG tablet Take 1 tablet (5 mg total) by mouth every 6 (six) hours as needed for nausea or vomiting. 07/24/23  Yes Ladene Artist, MD  simvastatin (ZOCOR) 40 MG tablet Take 20 mg by mouth daily  with supper.   Yes [provider]  polyethylene glycol (MIRALAX / GLYCOLAX) 17 g packet Take 17 g by mouth daily as needed for mild constipation. 07/12/23   Regalado, Prentiss Bells, MD     Family History  Problem Relation Age of Onset   Uterine cancer Mother        dx in her 47s   Pancreatic cancer Father    Cerebral aneurysm Sister    Melanoma Sister 44 - 10       on her leg   Sarcoidosis Sister 90   Lung cancer Brother        smoker   Pancreatic cancer Niece 36 - 33       paternal half brother's daughter   Lung cancer Niece 18 - 82       smoker    Social History   Socioeconomic History   Marital status: Married    Spouse name: Not on file   Number of children: Not on file   Years of education: 1   Highest education level: Not on file  Occupational History   Not on file  Tobacco Use   Smoking status: Never   Smokeless tobacco: Never  Substance and Sexual Activity   Alcohol use: No   Drug use: No   Sexual activity: Not on file  Other Topics Concern   Not on file  Social History Narrative   Not on file   Social Determinants of Health   Financial Resource Strain: Low Risk  (08/05/2023)   Overall Financial Resource Strain (CARDIA)    Difficulty of Paying Living Expenses: Not hard at all  Food Insecurity: No Food Insecurity (11/04/2023)   Hunger Vital Sign    Worried About Running Out of Food in the Last Year: Never true    Ran Out of Food in the Last Year: Never true  Transportation Needs: No Transportation Needs (11/04/2023)   PRAPARE - Administrator, Civil Service (Medical): No    Lack of Transportation (Non-Medical): No  Physical Activity: Not on file  Stress: Not on file  Social Connections: Not on file       Review of Systems: A 12 point ROS discussed and pertinent positives are indicated in the HPI above.  All other systems are negative.  Review of Systems  Vital Signs: BP (!) 157/73 (BP Location: Left Arm)   Pulse 91   Temp 98.3 F  (36.8 C) (Oral)   Resp 17   Ht 5\' 6"  (1.676 m)   Wt 53.5 kg   SpO2 99%   BMI 19.04 kg/m       Physical Exam General: 89 yo female appearing stated age.   Skin: Decreasing jaundice HEENT: Atraumatic, normocephalic.  Conjugate gaze, extra-ocular motor intact.   Neck: Symmetric with no goiter enlargement.  Chest/Lungs:  Symmetric chest with inspiration/expiration.  No labored breathing.    Heart:   No JVD appreciated.  Abdomen:  Drain in place RUQ.  Draining dark bile Genito-urinary: Deferred Neurologic: Alert & Oriented to person, place,  and time.   Normal affect and insight.  Appropriate questions.       Imaging: IR BILIARY DRAIN PLACEMENT WITH CHOLANGIOGRAM  Result Date: 11/05/2023 INDICATION: 88 year old female with a history of biliary obstruction at the pancreatic head, prior duodenal stent placement for gastric outlet obstruction, and recent failed ERCP. She presents for percutaneous transhepatic cholangiogram and possible drainage EXAM: PERCUTANEOUS TRANSHEPATIC CHOLANGIOGRAM AND DRAINAGE MEDICATIONS: 2 g cefoxitin; The antibiotic was administered within an appropriate time frame prior to the initiation of the procedure. ANESTHESIA/SEDATION: Moderate (conscious) sedation was employed during this procedure. A total of Versed 2.5 mg and Fentanyl 100 mcg was administered intravenously by the radiology nurse. Total intra-service moderate Sedation Time: 37 minutes. The patient's level of consciousness and vital signs were monitored continuously by radiology nursing throughout the procedure under my direct supervision. FLUOROSCOPY: Radiation Exposure Index (as provided by the fluoroscopic device): 26 mGy Kerma COMPLICATIONS: None PROCEDURE: The procedure, risks, benefits, and alternatives were explained to the patient and the patient's family. A complete informed consent was performed, with risk benefit analysis. Specific risks that were discussed for the procedure include bleeding,  infection, biliary sepsis, IC use day, organ injury, need for further procedure, need for further surgery, long-term drain placement, cardiopulmonary collapse, death. Questions regarding the procedure were encouraged and answered. The patient understands and consents to the procedure. Patient is position in supine position on the fluoroscopy table, and the upper abdomen was prepped and draped in the usual sterile fashion. Maximum barrier sterile technique with sterile gowns and gloves were used for the procedure. A timeout was performed prior to the initiation of the procedure. Local anesthesia was provided with 1% lidocaine with epinephrine. Ultrasound survey of the left liver lobe was performed, with then ultrasound of the right liver lobe. Window into the right biliary system was present for a intercostal approach. 1% lidocaine was used for local anesthesia, with generous infiltration of the skin and subcutaneous tissues in and inter left costal location. A Chiba needle was advanced under ultrasound guidance into the right liver lobe. Once the tip of the needle was confirmed within the biliary system by injecting small aliquots of contrast, images were stored of the biliary system after partially opacifying the biliary tree via the needle. Z610 wire was advanced centrally. The needle was removed, a small incision was made with an 11 blade scalpel, and then a triaxial Accustick system was advanced into the biliary system. The metal stiffener and dilator were removed, we confirmed placement with contrast infusion. Images were stored. A coaxial Glidewire and 4 French glide cath were then used to attempt to navigate across the obstruction at the common bile duct. Combination of stiff Glidewire and a roadrunner wire were used with the 4 [place] glide cath. We were unable to navigate through this obstruction at the time. We elected to withdraw given the patient's discomfort and significant fluoroscopy time. A standard  035 Bentson wire was advanced into the bile system. Dilation of the subcutaneous tissue tracks was performed with a 10 [place] dilator, and then a 50 [place] externalized standard pigtail drain was placed as an external biliary drain. Small amount of contrast confirmed location. Sample was sent for culture. The patient tolerated the procedure well and remained hemodynamically stable throughout. No complications were encountered and no significant blood loss was encountered. IMPRESSION: Status post image guided percutaneous transhepatic cholangiogram with externalized drain. Signed, Yvone Neu. Miachel Roux, RPVI [facility] Radiology PLAN: We will allow 24-48  hours of decompression time, and planned for potentially a second attempt for internalization. Electronically Signed   By: Gilmer Mor D.O.   On: 11/05/2023 08:28   DG C-Arm 1-60 Min-No Report  Result Date: 11/04/2023 Fluoroscopy was utilized by the requesting physician.  No radiographic interpretation.   DG C-Arm 1-60 Min-No Report  Result Date: 11/04/2023 Fluoroscopy was utilized by the requesting physician.  No radiographic interpretation.   CT CHEST ABDOMEN PELVIS W CONTRAST  Result Date: 10/10/2023 CLINICAL DATA:  Restaging pancreatic cancer. * Tracking Code: BO * EXAM: CT CHEST, ABDOMEN, AND PELVIS WITH CONTRAST TECHNIQUE: Multidetector CT imaging of the chest, abdomen and pelvis was performed following the standard protocol during bolus administration of intravenous contrast. RADIATION DOSE REDUCTION: This exam was performed according to the departmental dose-optimization program which includes automated exposure control, adjustment of the mA and/or kV according to patient size and/or use of iterative reconstruction technique. CONTRAST:  OMNIPAQUE IOHEXOL 300 MG/ML  SOLN COMPARISON:  Multiple exams, including 07/11/2023 and 07/07/2023 FINDINGS: CT CHEST FINDINGS Cardiovascular: Right  Port-A-Cath tip: SVC. Coronary, aortic arch, and branch vessel atherosclerotic vascular disease. Mediastinum/Nodes: Hypodense 1.6 cm left thyroid nodule. Recommend thyroid US (ref: J Am Coll Radiol. 2015 Feb;12(2): 143-50). No pathologic adenopathy. Lungs/Pleura: Biapical pleuroparenchymal scarring. A few bilateral lower lobe nodules are present. These appear similar to prior. The largest nodule measures 6 by 5 mm in the right lower lobe on image 127 of series 4, stable. Musculoskeletal: Mild thoracic spondylosis. CT ABDOMEN PELVIS FINDINGS Hepatobiliary: No metastatic lesion to the liver is identified. Mild intrahepatic biliary dilatation is present with common hepatic duct dilated up to 1.2 cm and a tapered irregular appearance in the distal common bile duct in the vicinity of the pancreatic head. Pancreas: Prominently dilated dorsal pancreatic duct substantially worsened from prior, currently up to 1 cm in diameter, previously 0.3 cm. Upper uptake bring at the ampulla with other serpentine fluid signal intensity lesions along the pancreatic head likely representing dilated ventral duct. This may well be caused by the pancreatic mass, but the expandable duodenal stent which traverses the region of the ampulla may be contributory as well. There is some distortion of the orientation of the pancreatic head and the mass due to the presence of a new expandable stent in the duodenum adjacent to mass. However, compensating for this, the pancreatic mass appears reduced in size, I measure at and about 3.1 by 1.3 cm on image 81 of series 2, formerly up to 4.0 by 3.2 cm. Spleen: Unremarkable Adrenals/Urinary Tract: Unremarkable Stomach/Bowel: As noted above there is an expandable duodenal stent traversing the duodenal bulb and second portion of the duodenum, the stent is patent although there may be some epithelialization proximally. Stent caliber 2.4 cm. Vascular/Lymphatic: Atherosclerosis is present, including aortoiliac  atherosclerotic disease. Narrowing of the proximal celiac trunk due to atheromatous vascular disease and median arcuate ligament. Narrowed proximal SMA nonspecific although soft plaque may be contributory. Neither vessel is overtly occluded. The portal vein, SMV, and splenic vein are patent without evidence of tumor invasion. No tumor directly around the SMA or celiac trunk. Reproductive: Unremarkable Other: No supplemental non-categorized findings. Musculoskeletal: Prominent degenerative hip arthropathy bilaterally. Mild lumbar spondylosis and degenerative disc disease. IMPRESSION: 1. The pancreatic mass appears reduced in size, currently 3.1 by 1.3 cm, formerly up to 4.0 by 3.2 cm. However, the new duodenal stent in this region may be changing the orientation of the mass. 2. The pancreatic duct caliber is substantially increased from  prior, currently up to 1 cm in diameter, previously 0.3 cm. There is also new biliary dilatation. This may well be caused by the pancreatic mass, but the expandable duodenal stent which traverses the region of the ampulla may be contributory as well. 3. A few bilateral lower lobe nodules are present, the largest measuring 6 by 5 mm in the right lower lobe, stable. Surveillance recommended. 4. Hypodense 1.6 cm left thyroid nodule. Recommend thyroid US. 5. Aortic atherosclerosis. Aortic Atherosclerosis (ICD10-I70.0). Electronically Signed   By: Gaylyn Rong M.D.   On: 10/10/2023 17:21    Labs:  CBC: Recent Labs    10/13/23 0856 10/28/23 0805 11/04/23 1334 11/05/23 0450  WBC 4.5 5.5 6.6 6.9  HGB 10.4* 9.8* 11.9* 10.2*  HCT 32.4* 30.6* 36.8 31.8*  PLT 247 290 348 295    COAGS: Recent Labs    11/04/23 1334  INR 1.0    BMP: Recent Labs    10/13/23 0856 10/28/23 0805 11/04/23 0729 11/05/23 0450  NA 138 136 138 133*  K 3.9 3.9 3.5 4.2  CL 104 104 104 106  CO2 26 26 23  19*  GLUCOSE 120* 109* 122* 137*  BUN 24* 23 14 24*  CALCIUM 9.8 10.1 9.9 9.6   CREATININE 0.68 0.52 0.69 0.93  GFRNONAA >60 >60 >60 59*    LIVER FUNCTION TESTS: Recent Labs    10/13/23 0856 10/28/23 0805 11/04/23 0729 11/05/23 0450  BILITOT 0.4 1.4* 6.0* 2.6*  AST 69* 147* 191* 140*  ALT 85* 190* 220* 185*  ALKPHOS 229* 785* 755* 658*  PROT 6.3* 6.0* 6.2* 5.6*  ALBUMIN 3.8 4.0 3.3* 2.9*    TUMOR MARKERS: No results for input(s): "AFPTM", "CEA", "CA199", "CHROMGRNA" in the last 8760 hours.  Assessment and Plan:   89 yo female with biliary obstruction, POD 1 SP ERCP and PTC and external drainage.   We will need to make another attempt for internalization of the biliary drain, as she has high output and will be at risk for dehydration and electrolyte abnormalities.    Her t bili is trending down with drainage.   After discussion with the patient and with Dr. Ewing Schlein, we will plan on 1 additional night stay, with another attempt at internalization tomorrow on Thursday.  Hopefully this can be performed in the morning, but will be an add on.   Plan: 1 night further observation, with attempt tomorrow for internalization Continue diet, with NPO past midnight Fluid repletion based on outputs  Electronically Signed: Gilmer Mor, DO 11/05/2023, 9:20 AM   I spent a total of    25 Minutes in face to face in clinical consultation, greater than 50% of which was counseling/coordinating care for PTC drainage, with plans for repeat attempt at internalization

## 2023-11-05 NOTE — Progress Notes (Signed)
Wendy [name] 11:30 AM  Subjective: Patient doing well from her 2 procedures yesterday Case discussed with IR team as well as her son and we answered all of their questions  Objective: Vital signs stable afebrile no acute distress patient looks well not examined today LFTs decreased CBC okay  Assessment: Bile duct obstruction secondary to pancreatic cancer awaiting [facility] November 18  Plan: Agree with attempt at  internalizing drain tomorrow by IR and will try to stop by tomorrow if I am at [facility] and please call me if I can be of any further assistance with this hospital stay [facility] E  office [phone] After 5PM or if no answer call [phone]

## 2023-11-05 NOTE — Progress Notes (Signed)
PROGRESS NOTE  OPAL DINNING ZOX:[id number] DOB: [birth year] DOA: 11/04/2023 PCP: [name], MD   LOS: 0 days   Brief Narrative / Interim history: 89 year old female with HTN, HLD, PAF not on anticoagulation, pancreatic cancer s/p chemotherapy with plans for Whipple in about 2 weeks comes into the hospital with jaundice.  She was also found to have elevated LFTs, GI attempted stenting via ERCP but it was unsuccessful.  IR was consulted and underwent percutaneous transhepatic biliary drain on 11/5  Subjective / 24h Interval events: She is doing well this morning, reports right upper quadrant abdominal pain which is controlled with oral agents  Assesement and Plan: Principal Problem:   Pancreatic cancer (HCC) Active Problems:   Biliary obstruction   Normocytic anemia   Paroxysmal atrial fibrillation (HCC)   Hypertensive urgency   Hyperlipidemia   GERD (gastroesophageal reflux disease)  Principal problem Pancreatic cancer, biliary obstruction -patient came into the hospital with elevated LFTs.  GI attempted an ERCP, biliary stent placement was unsuccessful.  IR was consulted and she is status post percutaneous transhepatic biliary drain on 11/5.  LFTs improving postprocedural, will attempt internalization tomorrow -Plan for Whipple in 12 days.  Dr. [name] aware that she is hospitalized  Active problems Essential hypertension-continue benazepril  PAF-currently in normal sinus rhythm, has been off Xarelto since initial diagnosis back in July.  Normocytic anemia-monitor, no bleeding  Hyperlipidemia-resume statin when LFTs improving   Scheduled Meds:  enoxaparin (LOVENOX) injection  40 mg Subcutaneous Q24H   sodium chloride flush  3 mL Intravenous Q12H   Continuous Infusions: PRN Meds:.albuterol, hydrALAZINE, HYDROcodone-acetaminophen, ondansetron **OR** ondansetron (ZOFRAN) IV  Current Outpatient Medications  Medication Instructions   acetaminophen (TYLENOL) 1,000 mg,  Oral, Every 6 hours PRN   benazepril (LOTENSIN) 20 mg, Oral, Daily   cyanocobalamin (VITAMIN B12) 1,000 mcg, Oral, Daily   docusate sodium (COLACE) 100 mg, Oral, 2 times daily   famotidine (PEPCID) 40 mg, Oral, Daily   ferrous sulfate 325 mg, Oral, Daily with breakfast   lidocaine-prilocaine (EMLA) cream 1 Application, Topical, As needed   Multiple Vitamin (MULTIVITAMIN WITH MINERALS) TABS tablet 1 tablet, Oral, Daily   ondansetron (ZOFRAN) 4 mg, Oral, Every 6 hours PRN   polyethylene glycol (MIRALAX / GLYCOLAX) 17 g, Oral, Daily PRN   prochlorperazine (COMPAZINE) 5 mg, Oral, Every 6 hours PRN   simvastatin (ZOCOR) 20 mg, Oral, Daily with supper    Diet Orders (From admission, onward)     Start     Ordered   11/05/23 0843  Diet regular Room service appropriate? Yes; Fluid consistency: Thin  Diet effective now       Question Answer Comment  Room service appropriate? Yes   Fluid consistency: Thin      11/05/23 0842            DVT prophylaxis: enoxaparin (LOVENOX) injection 40 mg Start: 11/05/23 1000   Lab Results  Component Value Date   PLT 295 11/05/2023      Code Status: Full Code  Family Communication: no family at bedside   Status is: Observation The patient will require care spanning > 2 midnights and should be moved to inpatient because: procedure tomorrow   Level of care: Telemetry Medical  Consultants:  IR GI  Objective: Vitals:   11/04/23 1944 11/05/23 0123 11/05/23 0411 11/05/23 0807  BP: (!) 159/77  134/66 (!) 157/73  Pulse: 80  94 91  Resp: 18  18 17   Temp: 97.8 F (36.6 C)  98.6 F (  37 C) 98.3 F (36.8 C)  TempSrc: Oral  Oral Oral  SpO2: 100%  99% 99%  Weight:  53.5 kg    Height:  5\' 6"  (1.676 m)      Intake/Output Summary (Last 24 hours) at 11/05/2023 1002 Last data filed at 11/05/2023 0500 Gross per 24 hour  Intake --  Output 975 ml  Net -975 ml   Wt Readings from Last 3 Encounters:  11/05/23 53.5 kg  10/28/23 53.5 kg  10/13/23  54.2 kg    Examination:  Constitutional: NAD Eyes: no scleral icterus ENMT: Mucous membranes are moist.  Neck: normal, supple Respiratory: clear to auscultation bilaterally, no wheezing, no crackles. Normal respiratory effort. Cardiovascular: Regular rate and rhythm, no murmurs / rubs / gallops. No LE edema.  Abdomen: non distended, no tenderness. Bowel sounds positive.  Musculoskeletal: no clubbing / cyanosis.   Data Reviewed: I have independently reviewed following labs and imaging studies   CBC Recent Labs  Lab 11/04/23 1334 11/05/23 0450  WBC 6.6 6.9  HGB 11.9* 10.2*  HCT 36.8 31.8*  PLT 348 295  MCV 91.3 91.1  MCH 29.5 29.2  MCHC 32.3 32.1  RDW 18.3* 18.4*    Recent Labs  Lab 11/04/23 0729 11/04/23 1334 11/05/23 0450  NA 138  --  133*  K 3.5  --  4.2  CL 104  --  106  CO2 23  --  19*  GLUCOSE 122*  --  137*  BUN 14  --  24*  CREATININE 0.69  --  0.93  CALCIUM 9.9  --  9.6  AST 191*  --  140*  ALT 220*  --  185*  ALKPHOS 755*  --  658*  BILITOT 6.0*  --  2.6*  ALBUMIN 3.3*  --  2.9*  INR  --  1.0  --     ------------------------------------------------------------------------------------------------------------------ No results for input(s): "CHOL", "HDL", "LDLCALC", "TRIG", "CHOLHDL", "LDLDIRECT" in the last 72 hours.  No results found for: "HGBA1C" ------------------------------------------------------------------------------------------------------------------ No results for input(s): "TSH", "T4TOTAL", "T3FREE", "THYROIDAB" in the last 72 hours.  Invalid input(s): "FREET3"  Cardiac Enzymes No results for input(s): "CKMB", "TROPONINI", "MYOGLOBIN" in the last 168 hours.  Invalid input(s): "CK" ------------------------------------------------------------------------------------------------------------------ No results found for: "BNP"  CBG: No results for input(s): "GLUCAP" in the last 168 hours.  Recent Results (from the past 240 hour(s))   Aerobic/Anaerobic Culture w Gram Stain (surgical/deep wound)     Status: None (Preliminary result)   Collection Time: 11/04/23  4:52 PM   Specimen: Gallbladder; Bile  Result Value Ref Range Status   Specimen Description GALL BLADDER  Final   Special Requests NONE  Final   Gram Stain NO WBC SEEN NO ORGANISMS SEEN   Final   Culture   Final    NO GROWTH < 24 HOURS Performed at [facility], [address] [place], [place] [address]    Report Status PENDING  Incomplete     Radiology Studies: IR BILIARY DRAIN PLACEMENT WITH CHOLANGIOGRAM  Result Date: 11/05/2023 INDICATION: 89 year old female with a history of biliary obstruction at the pancreatic head, prior duodenal stent placement for gastric outlet obstruction, and recent failed ERCP. She presents for percutaneous transhepatic cholangiogram and possible drainage EXAM: PERCUTANEOUS TRANSHEPATIC CHOLANGIOGRAM AND DRAINAGE MEDICATIONS: 2 g cefoxitin; The antibiotic was administered within an appropriate time frame prior to the initiation of the procedure. ANESTHESIA/SEDATION: Moderate (conscious) sedation was employed during this procedure. A total of Versed 2.5 mg and Fentanyl 100 mcg was  administered intravenously by the radiology nurse. Total intra-service moderate Sedation Time: 37 minutes. The patient's level of consciousness and vital signs were monitored continuously by radiology nursing throughout the procedure under my direct supervision. FLUOROSCOPY: Radiation Exposure Index (as provided by the fluoroscopic device): 26 mGy Kerma COMPLICATIONS: None PROCEDURE: The procedure, risks, benefits, and alternatives were explained to the patient and the patient's family. A complete informed consent was performed, with risk benefit analysis. Specific risks that were discussed for the procedure include bleeding, infection, biliary sepsis, IC use day, organ injury, need for further procedure, need for further surgery, long-term drain placement,  cardiopulmonary collapse, death. Questions regarding the procedure were encouraged and answered. The patient understands and consents to the procedure. Patient is position in supine position on the fluoroscopy table, and the upper abdomen was prepped and draped in the usual sterile fashion. Maximum barrier sterile technique with sterile gowns and gloves were used for the procedure. A timeout was performed prior to the initiation of the procedure. Local anesthesia was provided with 1% lidocaine with epinephrine. Ultrasound survey of the left liver lobe was performed, with then ultrasound of the right liver lobe. Window into the right biliary system was present for a intercostal approach. 1% lidocaine was used for local anesthesia, with generous infiltration of the skin and subcutaneous tissues in and inter left costal location. A Chiba needle was advanced under ultrasound guidance into the right liver lobe. Once the tip of the needle was confirmed within the biliary system by injecting small aliquots of contrast, images were stored of the biliary system after partially opacifying the biliary tree via the needle. O962 wire was advanced centrally. The needle was removed, a small incision was made with an 11 blade scalpel, and then a triaxial Accustick system was advanced into the biliary system. The metal stiffener and dilator were removed, we confirmed placement with contrast infusion. Images were stored. A coaxial Glidewire and 4 French glide cath were then used to attempt to navigate across the obstruction at the common bile duct. Combination of stiff Glidewire and a roadrunner wire were used with the 4 [place] glide cath. We were unable to navigate through this obstruction at the time. We elected to withdraw given the patient's discomfort and significant fluoroscopy time. A standard 035 Bentson wire was advanced into the bile system. Dilation of the subcutaneous tissue tracks was performed with a 10 [place] dilator,  and then a 37 [place] externalized standard pigtail drain was placed as an external biliary drain. Small amount of contrast confirmed location. Sample was sent for culture. The patient tolerated the procedure well and remained hemodynamically stable throughout. No complications were encountered and no significant blood loss was encountered. IMPRESSION: Status post image guided percutaneous transhepatic cholangiogram with externalized drain. Signed, Yvone Neu. Reyne Dumas, ABVM, RPVI [facility] Radiology PLAN: We will allow 24-48 hours of decompression time, and planned for potentially a second attempt for internalization. Electronically Signed   By: Gilmer Mor D.O.   On: 11/05/2023 08:28   DG C-Arm 1-60 Min-No Report  Result Date: 11/04/2023 Fluoroscopy was utilized by the requesting physician.  No radiographic interpretation.   DG C-Arm 1-60 Min-No Report  Result Date: 11/04/2023 Fluoroscopy was utilized by the requesting physician.  No radiographic interpretation.     Pamella Pert, MD, PhD [facility]  Between 7 am - 7 pm I am available, please contact me via Amion (for emergencies) or Securechat (non urgent messages)  Between 7 pm -  7 am I am not available, please contact night coverage MD/APP via Amion

## 2023-11-05 NOTE — Consult Note (Signed)
Chief Complaint: Patient was seen in consultation today for Biliary drain internalization and stent placement at the request of Dr Berton Lan  Supervising Physician: Gilmer Mor  Patient Status: [facility] - In-pt  History of Present Illness: Wendy [name] is a 89 y.o. female   FULL Code status per pt  Known to IR Placed external biliary drain 11/5---Biliary obstruction- panc cancer Doing well this am TB 2.6 (6.0)  Up in chair Drain intact OP great  Was seen by Dr Ewing Schlein and Dr Loreta Ave this am Plans for internalization and Biliary stent placement in IR tomorrow    Past Medical History:  Diagnosis Date   Arthritis    Family history of melanoma    Family history of pancreatic cancer    Family history of uterine cancer    Hyperlipidemia    Hypertension    pancreatic ca 06/2023   new dx   Vitamin D deficiency     Past Surgical History:  Procedure Laterality Date   BIOPSY  07/08/2023   Procedure: BIOPSY;  Surgeon: Vida Rigger, MD;  Location: [facility];  Service: Gastroenterology;;   CATARACT EXTRACTION Bilateral    DUODENAL STENT PLACEMENT N/A 07/10/2023   Procedure: DUODENAL STENT PLACEMENT;  Surgeon: Vida Rigger, MD;  Location: [facility];  Service: Gastroenterology;  Laterality: N/A;   ERCP Bilateral 11/04/2023   Procedure: ENDOSCOPIC RETROGRADE CHOLANGIOPANCREATOGRAPHY (ERCP);  Surgeon: Vida Rigger, MD;  Location: [facility];  Service: Gastroenterology;  Laterality: Bilateral;   ESOPHAGOGASTRODUODENOSCOPY (EGD) WITH PROPOFOL N/A 07/08/2023   Procedure: ESOPHAGOGASTRODUODENOSCOPY (EGD) WITH PROPOFOL;  Surgeon: Vida Rigger, MD;  Location: [facility];  Service: Gastroenterology;  Laterality: N/A;   ESOPHAGOGASTRODUODENOSCOPY (EGD) WITH PROPOFOL N/A 07/10/2023   Procedure: ESOPHAGOGASTRODUODENOSCOPY (EGD) WITH PROPOFOL;  Surgeon: Vida Rigger, MD;  Location: [facility];  Service: Gastroenterology;  Laterality: N/A;   IR BILIARY DRAIN PLACEMENT WITH  CHOLANGIOGRAM  11/04/2023   PORTACATH PLACEMENT N/A 08/04/2023   Procedure: INSERTION PORT-A-CATH;  Surgeon: [name], MD;  Location: [facility];  Service: General;  Laterality: N/A;    Allergies: Bactrim [sulfamethoxazole-trimethoprim] and Protonix [pantoprazole]  Medications: Prior to Admission medications   Medication Sig Start Date End Date Taking? Authorizing Provider  acetaminophen (TYLENOL) 500 MG tablet Take 1,000 mg by mouth every 6 (six) hours as needed for moderate pain (pain score 4-6) (ususally at night).   Yes [provider]  benazepril (LOTENSIN) 20 MG tablet Take 20 mg by mouth daily.   Yes [provider]  cyanocobalamin (VITAMIN B12) 1000 MCG tablet Take 1,000 mcg by mouth daily.   Yes [provider]  docusate sodium (COLACE) 100 MG capsule Take 1 capsule (100 mg total) by mouth 2 (two) times daily. 07/12/23  Yes Regalado, Belkys A, MD  famotidine (PEPCID) 40 MG tablet Take 40 mg by mouth daily. 07/18/23  Yes [provider]  ferrous sulfate 325 (65 FE) MG EC tablet Take 1 tablet (325 mg total) by mouth daily with breakfast. 07/12/23 11/04/23 Yes Regalado, Belkys A, MD  lidocaine-prilocaine (EMLA) cream Apply 1 Application topically as needed (apply to Port 1-2 hours prior to appointment). 07/24/23  Yes Ladene Artist, MD  Multiple Vitamin (MULTIVITAMIN WITH MINERALS) TABS tablet Take 1 tablet by mouth daily. 07/13/23  Yes Regalado, Belkys A, MD  ondansetron (ZOFRAN) 4 MG tablet Take 1 tablet (4 mg total) by mouth every 6 (six) hours as needed for nausea. 07/12/23  Yes Regalado, Belkys A, MD  prochlorperazine (COMPAZINE) 5 MG tablet Take 1  tablet (5 mg total) by mouth every 6 (six) hours as needed for nausea or vomiting. 07/24/23  Yes Ladene Artist, MD  simvastatin (ZOCOR) 40 MG tablet Take 20 mg by mouth daily with supper.   Yes [provider]  polyethylene glycol (MIRALAX / GLYCOLAX) 17 g packet Take 17 g by mouth daily as needed  for mild constipation. 07/12/23   Regalado, Prentiss Bells, MD     Family History  Problem Relation Age of Onset   Uterine cancer Mother        dx in her 20s   Pancreatic cancer Father    Cerebral aneurysm Sister    Melanoma Sister 61 - 72       on her leg   Sarcoidosis Sister 55   Lung cancer Brother        smoker   Pancreatic cancer Niece 66 - 2       paternal half brother's daughter   Lung cancer Niece 8 - 61       smoker    Social History   Socioeconomic History   Marital status: Married    Spouse name: Not on file   Number of children: Not on file   Years of education: 1   Highest education level: Not on file  Occupational History   Not on file  Tobacco Use   Smoking status: Never   Smokeless tobacco: Never  Substance and Sexual Activity   Alcohol use: No   Drug use: No   Sexual activity: Not on file  Other Topics Concern   Not on file  Social History Narrative   Not on file   Social Determinants of Health   Financial Resource Strain: Low Risk  (08/05/2023)   Overall Financial Resource Strain (CARDIA)    Difficulty of Paying Living Expenses: Not hard at all  Food Insecurity: No Food Insecurity (11/04/2023)   Hunger Vital Sign    Worried About Running Out of Food in the Last Year: Never true    Ran Out of Food in the Last Year: Never true  Transportation Needs: No Transportation Needs (11/04/2023)   PRAPARE - Administrator, Civil Service (Medical): No    Lack of Transportation (Non-Medical): No  Physical Activity: Not on file  Stress: Not on file  Social Connections: Not on file    Review of Systems: A 12 point ROS discussed and pertinent positives are indicated in the HPI above.  All other systems are negative.  Vital Signs: BP (!) 157/73 (BP Location: Left Arm)   Pulse 91   Temp 98.3 F (36.8 C) (Oral)   Resp 17   Ht 5\' 6"  (1.676 m)   Wt 117 lb 15.2 oz (53.5 kg)   SpO2 99%   BMI 19.04 kg/m   Advance Care Plan: The advanced care  plan/surrogate decision maker was discussed at the time of visit and documented in the medical record.    Physical Exam Vitals reviewed.  HENT:     Mouth/Throat:     Mouth: Mucous membranes are moist.  Cardiovascular:     Rate and Rhythm: Normal rate and regular rhythm.     Heart sounds: Normal heart sounds. No murmur heard. Pulmonary:     Effort: Pulmonary effort is normal.     Breath sounds: Normal breath sounds. No wheezing.  Abdominal:     Tenderness: There is no abdominal tenderness.  Musculoskeletal:        General: Normal range of motion.  Skin:    General: Skin is warm.  Neurological:     Mental Status: She is alert and oriented to person, place, and time.  Psychiatric:        Behavior: Behavior normal.     Imaging: IR BILIARY DRAIN PLACEMENT WITH CHOLANGIOGRAM  Result Date: 11/05/2023 INDICATION: 89 year old female with a history of biliary obstruction at the pancreatic head, prior duodenal stent placement for gastric outlet obstruction, and recent failed ERCP. She presents for percutaneous transhepatic cholangiogram and possible drainage EXAM: PERCUTANEOUS TRANSHEPATIC CHOLANGIOGRAM AND DRAINAGE MEDICATIONS: 2 g cefoxitin; The antibiotic was administered within an appropriate time frame prior to the initiation of the procedure. ANESTHESIA/SEDATION: Moderate (conscious) sedation was employed during this procedure. A total of Versed 2.5 mg and Fentanyl 100 mcg was administered intravenously by the radiology nurse. Total intra-service moderate Sedation Time: 37 minutes. The patient's level of consciousness and vital signs were monitored continuously by radiology nursing throughout the procedure under my direct supervision. FLUOROSCOPY: Radiation Exposure Index (as provided by the fluoroscopic device): 26 mGy Kerma COMPLICATIONS: None PROCEDURE: The procedure, risks, benefits, and alternatives were explained to the patient and the patient's family. A complete informed consent was  performed, with risk benefit analysis. Specific risks that were discussed for the procedure include bleeding, infection, biliary sepsis, IC use day, organ injury, need for further procedure, need for further surgery, long-term drain placement, cardiopulmonary collapse, death. Questions regarding the procedure were encouraged and answered. The patient understands and consents to the procedure. Patient is position in supine position on the fluoroscopy table, and the upper abdomen was prepped and draped in the usual sterile fashion. Maximum barrier sterile technique with sterile gowns and gloves were used for the procedure. A timeout was performed prior to the initiation of the procedure. Local anesthesia was provided with 1% lidocaine with epinephrine. Ultrasound survey of the left liver lobe was performed, with then ultrasound of the right liver lobe. Window into the right biliary system was present for a intercostal approach. 1% lidocaine was used for local anesthesia, with generous infiltration of the skin and subcutaneous tissues in and inter left costal location. A Chiba needle was advanced under ultrasound guidance into the right liver lobe. Once the tip of the needle was confirmed within the biliary system by injecting small aliquots of contrast, images were stored of the biliary system after partially opacifying the biliary tree via the needle. Z610 wire was advanced centrally. The needle was removed, a small incision was made with an 11 blade scalpel, and then a triaxial Accustick system was advanced into the biliary system. The metal stiffener and dilator were removed, we confirmed placement with contrast infusion. Images were stored. A coaxial Glidewire and 4 French glide cath were then used to attempt to navigate across the obstruction at the common bile duct. Combination of stiff Glidewire and a roadrunner wire were used with the 4 [place] glide cath. We were unable to navigate through this obstruction at  the time. We elected to withdraw given the patient's discomfort and significant fluoroscopy time. A standard 035 Bentson wire was advanced into the bile system. Dilation of the subcutaneous tissue tracks was performed with a 10 [place] dilator, and then a 61 [place] externalized standard pigtail drain was placed as an external biliary drain. Small amount of contrast confirmed location. Sample was sent for culture. The patient tolerated the procedure well and remained hemodynamically stable throughout. No complications were encountered and no significant blood loss was encountered. IMPRESSION: Status post image guided  percutaneous transhepatic cholangiogram with externalized drain. Signed, Yvone Neu. Reyne Dumas, ABVM, RPVI [facility] Radiology PLAN: We will allow 24-48 hours of decompression time, and planned for potentially a second attempt for internalization. Electronically Signed   By: Gilmer Mor D.O.   On: 11/05/2023 08:28   DG C-Arm 1-60 Min-No Report  Result Date: 11/04/2023 Fluoroscopy was utilized by the requesting physician.  No radiographic interpretation.   DG C-Arm 1-60 Min-No Report  Result Date: 11/04/2023 Fluoroscopy was utilized by the requesting physician.  No radiographic interpretation.   CT CHEST ABDOMEN PELVIS W CONTRAST  Result Date: 10/10/2023 CLINICAL DATA:  Restaging pancreatic cancer. * Tracking Code: BO * EXAM: CT CHEST, ABDOMEN, AND PELVIS WITH CONTRAST TECHNIQUE: Multidetector CT imaging of the chest, abdomen and pelvis was performed following the standard protocol during bolus administration of intravenous contrast. RADIATION DOSE REDUCTION: This exam was performed according to the departmental dose-optimization program which includes automated exposure control, adjustment of the mA and/or kV according to patient size and/or use of iterative reconstruction technique. CONTRAST:  OMNIPAQUE IOHEXOL 300 MG/ML  SOLN  COMPARISON:  Multiple exams, including 07/11/2023 and 07/07/2023 FINDINGS: CT CHEST FINDINGS Cardiovascular: Right Port-A-Cath tip: SVC. Coronary, aortic arch, and branch vessel atherosclerotic vascular disease. Mediastinum/Nodes: Hypodense 1.6 cm left thyroid nodule. Recommend thyroid US (ref: J Am Coll Radiol. 2015 Feb;12(2): 143-50). No pathologic adenopathy. Lungs/Pleura: Biapical pleuroparenchymal scarring. A few bilateral lower lobe nodules are present. These appear similar to prior. The largest nodule measures 6 by 5 mm in the right lower lobe on image 127 of series 4, stable. Musculoskeletal: Mild thoracic spondylosis. CT ABDOMEN PELVIS FINDINGS Hepatobiliary: No metastatic lesion to the liver is identified. Mild intrahepatic biliary dilatation is present with common hepatic duct dilated up to 1.2 cm and a tapered irregular appearance in the distal common bile duct in the vicinity of the pancreatic head. Pancreas: Prominently dilated dorsal pancreatic duct substantially worsened from prior, currently up to 1 cm in diameter, previously 0.3 cm. Upper uptake bring at the ampulla with other serpentine fluid signal intensity lesions along the pancreatic head likely representing dilated ventral duct. This may well be caused by the pancreatic mass, but the expandable duodenal stent which traverses the region of the ampulla may be contributory as well. There is some distortion of the orientation of the pancreatic head and the mass due to the presence of a new expandable stent in the duodenum adjacent to mass. However, compensating for this, the pancreatic mass appears reduced in size, I measure at and about 3.1 by 1.3 cm on image 81 of series 2, formerly up to 4.0 by 3.2 cm. Spleen: Unremarkable Adrenals/Urinary Tract: Unremarkable Stomach/Bowel: As noted above there is an expandable duodenal stent traversing the duodenal bulb and second portion of the duodenum, the stent is patent although there may be some  epithelialization proximally. Stent caliber 2.4 cm. Vascular/Lymphatic: Atherosclerosis is present, including aortoiliac atherosclerotic disease. Narrowing of the proximal celiac trunk due to atheromatous vascular disease and median arcuate ligament. Narrowed proximal SMA nonspecific although soft plaque may be contributory. Neither vessel is overtly occluded. The portal vein, SMV, and splenic vein are patent without evidence of tumor invasion. No tumor directly around the SMA or celiac trunk. Reproductive: Unremarkable Other: No supplemental non-categorized findings. Musculoskeletal: Prominent degenerative hip arthropathy bilaterally. Mild lumbar spondylosis and degenerative disc disease. IMPRESSION: 1. The pancreatic mass appears reduced in size, currently 3.1 by 1.3 cm, formerly up to 4.0 by 3.2 cm.  However, the new duodenal stent in this region may be changing the orientation of the mass. 2. The pancreatic duct caliber is substantially increased from prior, currently up to 1 cm in diameter, previously 0.3 cm. There is also new biliary dilatation. This may well be caused by the pancreatic mass, but the expandable duodenal stent which traverses the region of the ampulla may be contributory as well. 3. A few bilateral lower lobe nodules are present, the largest measuring 6 by 5 mm in the right lower lobe, stable. Surveillance recommended. 4. Hypodense 1.6 cm left thyroid nodule. Recommend thyroid US. 5. Aortic atherosclerosis. Aortic Atherosclerosis (ICD10-I70.0). Electronically Signed   By: Gaylyn Rong M.D.   On: 10/10/2023 17:21    Labs:  CBC: Recent Labs    10/13/23 0856 10/28/23 0805 11/04/23 1334 11/05/23 0450  WBC 4.5 5.5 6.6 6.9  HGB 10.4* 9.8* 11.9* 10.2*  HCT 32.4* 30.6* 36.8 31.8*  PLT 247 290 348 295    COAGS: Recent Labs    11/04/23 1334  INR 1.0    BMP: Recent Labs    10/13/23 0856 10/28/23 0805 11/04/23 0729 11/05/23 0450  NA 138 136 138 133*  K 3.9 3.9 3.5 4.2   CL 104 104 104 106  CO2 26 26 23  19*  GLUCOSE 120* 109* 122* 137*  BUN 24* 23 14 24*  CALCIUM 9.8 10.1 9.9 9.6  CREATININE 0.68 0.52 0.69 0.93  GFRNONAA >60 >60 >60 59*    LIVER FUNCTION TESTS: Recent Labs    10/13/23 0856 10/28/23 0805 11/04/23 0729 11/05/23 0450  BILITOT 0.4 1.4* 6.0* 2.6*  AST 69* 147* 191* 140*  ALT 85* 190* 220* 185*  ALKPHOS 229* 785* 755* 658*  PROT 6.3* 6.0* 6.2* 5.6*  ALBUMIN 3.8 4.0 3.3* 2.9*    TUMOR MARKERS: No results for input(s): "AFPTM", "CEA", "CA199", "CHROMGRNA" in the last 8760 hours.  Assessment and Plan:  Scheduled for internalization of Biliary drain and stent placement in IR tomorrow Risks and benefits of  internalization of Biliary drain and stent placement discussed with the patient including, but not limited to bleeding, infection which may lead to sepsis or even death and damage to adjacent structures.  This interventional procedure involves the use of X-rays and because of the nature of the planned procedure, it is possible that we will have prolonged use of X-ray fluoroscopy.  Potential radiation risks to you include (but are not limited to) the following: - A slightly elevated risk for cancer  several years later in life. This risk is typically less than 0.5% percent. This risk is low in comparison to the normal incidence of human cancer, which is 33% for women and 50% for men according to the American Cancer Society. - Radiation induced injury can include skin redness, resembling a rash, tissue breakdown / ulcers and hair loss (which can be temporary or permanent).   The likelihood of either of these occurring depends on the difficulty of the procedure and whether you are sensitive to radiation due to previous procedures, disease, or genetic conditions.   IF your procedure requires a prolonged use of radiation, you will be notified and given written instructions for further action.  It is your responsibility to monitor the  irradiated area for the 2 weeks following the procedure and to notify your physician if you are concerned that you have suffered a radiation induced injury.    All of the patient's questions were answered, patient is agreeable to proceed.  Consent signed  and in chart.  Thank you for this interesting consult.  I greatly enjoyed meeting SHELLI PORTILLA and look forward to participating in their care.  A copy of this report was sent to the requesting provider on this date.  Electronically Signed: Robet Leu, PA-C 11/05/2023, 11:49 AM   I spent a total of 20 Minutes    in face to face in clinical consultation, greater than 50% of which was counseling/coordinating care for biliary drain internalization and stent placement

## 2023-11-05 NOTE — Progress Notes (Signed)
Mobility Specialist Progress Note:   11/05/23 1201  Mobility  Activity Ambulated with assistance in hallway  Level of Assistance Standby assist, set-up cues, supervision of patient - no hands on  Assistive Device None  Distance Ambulated (ft) 500 ft  Activity Response Tolerated well  Mobility Referral Yes  $Mobility charge 1 Mobility  Mobility Specialist Start Time (ACUTE ONLY) 1150  Mobility Specialist Stop Time (ACUTE ONLY) 1201  Mobility Specialist Time Calculation (min) (ACUTE ONLY) 11 min   Pt received in bed, family at bedside. Agreeable to mobility session. Ambulated in hallway before/ after recording BP. Tolerated well, asx throughout. Required SBA for safety, no AD. Returned pt to room, sitting up in chair. All needs met, call bell in reach.   Supine: 135/61 (82) Dangling EOB: 151/64 (89) Stand 1 min: 154/76 (98) Stand 3 min: 153/82 (102) Post Mobility: 142/78 (94)  Madgie Dhaliwal Mobility Specialist Please contact via Special educational needs teacher or  Rehab office at [phone]

## 2023-11-06 ENCOUNTER — Inpatient Hospital Stay (HOSPITAL_COMMUNITY): Payer: Medicare Other

## 2023-11-06 DIAGNOSIS — C250 Malignant neoplasm of head of pancreas: Secondary | ICD-10-CM | POA: Diagnosis not present

## 2023-11-06 HISTORY — PX: IR CONVERT BILIARY DRAIN TO INT EXT BILIARY DRAIN: IMG6045

## 2023-11-06 LAB — COMPREHENSIVE METABOLIC PANEL
ALT: 147 U/L — ABNORMAL HIGH (ref 0–44)
AST: 86 U/L — ABNORMAL HIGH (ref 15–41)
Albumin: 3 g/dL — ABNORMAL LOW (ref 3.5–5.0)
Alkaline Phosphatase: 553 U/L — ABNORMAL HIGH (ref 38–126)
Anion gap: 8 (ref 5–15)
BUN: 24 mg/dL — ABNORMAL HIGH (ref 8–23)
CO2: 23 mmol/L (ref 22–32)
Calcium: 9.5 mg/dL (ref 8.9–10.3)
Chloride: 105 mmol/L (ref 98–111)
Creatinine, Ser: 0.74 mg/dL (ref 0.44–1.00)
GFR, Estimated: >60 mL/min (ref >60)
Glucose, Bld: 117 mg/dL — ABNORMAL HIGH (ref 70–99)
Potassium: 4.4 mmol/L (ref 3.5–5.1)
Sodium: 136 mmol/L (ref 135–145)
Total Bilirubin: 2 mg/dL — ABNORMAL HIGH (ref <1.2)
Total Protein: 5.5 g/dL — ABNORMAL LOW (ref 6.5–8.1)

## 2023-11-06 LAB — CBC
HCT: 32.2 % — ABNORMAL LOW (ref 36.0–46.0)
Hemoglobin: 10.2 g/dL — ABNORMAL LOW (ref 12.0–15.0)
MCH: 29.2 pg (ref 26.0–34.0)
MCHC: 31.7 g/dL (ref 30.0–36.0)
MCV: 92.3 fL (ref 80.0–100.0)
Platelets: 280 10*3/uL (ref 150–400)
RBC: 3.49 MIL/uL — ABNORMAL LOW (ref 3.87–5.11)
RDW: 18.7 % — ABNORMAL HIGH (ref 11.5–15.5)
WBC: 7 10*3/uL (ref 4.0–10.5)
nRBC: 0 % (ref 0.0–0.2)

## 2023-11-06 LAB — MAGNESIUM: Magnesium: 1.8 mg/dL (ref 1.7–2.4)

## 2023-11-06 MED ORDER — MIDAZOLAM HCL 2 MG/2ML IJ SOLN
INTRAMUSCULAR | Status: AC
  Filled 2023-11-06: qty 2

## 2023-11-06 MED ORDER — LIDOCAINE HCL 1 % IJ SOLN
INTRAMUSCULAR | Status: AC
  Filled 2023-11-06: qty 20

## 2023-11-06 MED ORDER — FERROUS SULFATE 325 (65 FE) MG PO TABS: 325.0000 mg | ORAL_TABLET | Freq: Every day | ORAL | Status: DC

## 2023-11-06 MED ORDER — FENTANYL CITRATE (PF) 100 MCG/2ML IJ SOLN
INTRAMUSCULAR | Status: AC
  Filled 2023-11-06: qty 2

## 2023-11-06 MED ORDER — SODIUM CHLORIDE 0.9% FLUSH
5.0000 mL | Freq: Three times a day (TID) | INTRAVENOUS | Status: DC
  Administered 2023-11-06: 5 mL

## 2023-11-06 MED ORDER — OXYCODONE HCL 5 MG PO TABS: 5.0000 mg | ORAL_TABLET | Freq: Three times a day (TID) | ORAL | 0 refills | Status: DC | PRN

## 2023-11-06 MED ORDER — KETOROLAC TROMETHAMINE 15 MG/ML IJ SOLN
15.0000 mg | Freq: Four times a day (QID) | INTRAMUSCULAR | Status: DC | PRN
  Administered 2023-11-06: 15 mg via INTRAVENOUS
  Filled 2023-11-06: qty 1

## 2023-11-06 MED ORDER — BENAZEPRIL HCL 20 MG PO TABS
20.0000 mg | ORAL_TABLET | Freq: Every day | ORAL | Status: DC
  Administered 2023-11-06: 20 mg via ORAL
  Filled 2023-11-06: qty 1

## 2023-11-06 MED ORDER — FAMOTIDINE 20 MG PO TABS
40.0000 mg | ORAL_TABLET | Freq: Every day | ORAL | Status: DC
  Administered 2023-11-06: 40 mg via ORAL
  Filled 2023-11-06: qty 2

## 2023-11-06 MED ORDER — OXYCODONE HCL 5 MG PO TABS
5.0000 mg | ORAL_TABLET | Freq: Four times a day (QID) | ORAL | Status: DC | PRN
  Administered 2023-11-06: 5 mg via ORAL
  Filled 2023-11-06: qty 1

## 2023-11-06 MED ORDER — MIDAZOLAM HCL 2 MG/2ML IJ SOLN
INTRAMUSCULAR | Status: AC | PRN
  Administered 2023-11-06: .5 mg via INTRAVENOUS

## 2023-11-06 MED ORDER — POLYETHYLENE GLYCOL 3350 17 G PO PACK: 17.0000 g | PACK | Freq: Every day | ORAL | Status: DC | PRN

## 2023-11-06 MED ORDER — IOHEXOL 300 MG/ML  SOLN
50.0000 mL | Freq: Once | INTRAMUSCULAR | Status: AC | PRN
Start: 2023-11-06 — End: 2023-11-06
  Administered 2023-11-06: 10 mL

## 2023-11-06 MED ORDER — MIDAZOLAM HCL 2 MG/2ML IJ SOLN
INTRAMUSCULAR | Status: AC | PRN
  Administered 2023-11-06 (×3): .5 mg via INTRAVENOUS

## 2023-11-06 MED ORDER — FENTANYL CITRATE (PF) 100 MCG/2ML IJ SOLN
INTRAMUSCULAR | Status: AC | PRN
  Administered 2023-11-06: 25 ug via INTRAVENOUS

## 2023-11-06 MED ORDER — FENTANYL CITRATE (PF) 100 MCG/2ML IJ SOLN
INTRAMUSCULAR | Status: AC | PRN
  Administered 2023-11-06 (×3): 25 ug via INTRAVENOUS

## 2023-11-06 MED ORDER — LIDOCAINE-PRILOCAINE 2.5-2.5 % EX CREA: 1.0000 | TOPICAL_CREAM | CUTANEOUS | Status: DC | PRN

## 2023-11-06 NOTE — Progress Notes (Signed)
General Surgery  Patient was admitted two days ago with Tbili of 6 after ERCP unable to be performed through duodenal stent. She now has an external PTBD, with downtrending bilirubin. She was out of the room at the time of my exam, for IR procedure to attempt internalization of her drain. Will follow up with patient tomorrow morning. She is scheduled for surgery later this month on 11/18.  Sophronia Simas, MD [facility], Hepatobiliary and Pancreatic Surgery 11/06/23 8:41 AM

## 2023-11-06 NOTE — Procedures (Addendum)
Interventional Radiology Procedure Note  Procedure:   Image guided biliary int/ext drain placement, across pancreatic tumor/CBD obstruction. 40F drain to gravity.   Complications: None  Recommendations:  - Advance diet - VIR will follow up and determine if ok for DC after recovery.  If clear bile with no blood, can cap for DC today - routine wound care - Surgical plan is established  Signed,  Yvone Neu. Loreta Ave, DO

## 2023-11-06 NOTE — Progress Notes (Signed)
Interventional Radiology Brief Note:  Patient assessed at bedside alongside Dr. Loreta Ave.  She is sitting up in chair, resting comfortably.  She was able to eat lunch with tolerance.  Family at bedside able to ask several questions which are answered. Reviewed procedure and outcomes.  Drain capped at bedside.  There is bilious output in her current gravity bag which is left aside for purposes of RN charting.  A new bag is left in the room with the family with instructions on how to manage after discharge.  She should flush every 1-2 days to maintain tube and follow-up with Dr. [name] as planned.   AVS updated with drain care instructions.   Loyce Dys, MS RD PA-C

## 2023-11-06 NOTE — Progress Notes (Signed)
Patient a&o x4; Airway and IV patent. VSS. Patient denies signs/symptoms of discomfort or distress. None observed. Patient drinking water without difficulty. Dr. Loreta Ave explained outcome at bedside. Patient confirmed understanding.

## 2023-11-06 NOTE — Progress Notes (Signed)
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

## 2023-11-06 NOTE — Discharge Summary (Signed)
Physician Discharge Summary  Wendy [name] YQM:[id number] DOB: 1934-07-13 DOA: 11/04/2023  PCP: Wendy Hazel, MD  Admit date: 11/04/2023 Discharge date: 11/06/2023  Admitted From: home Disposition:  home  Recommendations for Outpatient Follow-up:  Follow up with PCP in 1-2 weeks Please obtain BMP/CBC in one week Follow up with Dr [name] as scheduled for surgery   Home Health: none Equipment/Devices: none  Discharge Condition: stable CODE STATUS: Full code Diet Orders (From admission, onward)     Start     Ordered   11/06/23 1038  Diet regular Room service appropriate? Yes; Fluid consistency: Thin  Diet effective now       Question Answer Comment  Room service appropriate? Yes   Fluid consistency: Thin      11/06/23 1037            HPI: Per admitting MD, Wendy [name] is a 89 y.o. female with medical history significant of hypertension, hyperlipidemia, atrial fibrillation not on anticoagulation, and pancreatic cancer.  She initially presented for gastric outlet obstruction in July and was found to have a large mass in the head of the pancreas.  Patient went placement of a duodenal stent and biopsies confirmed adenocarcinoma.  She has been off of Xarelto since that time.  [name] had completed neoadjuvant chemotherapy with gemcitabine/Abraxane with her last cycle on October 14.  She was planned to have chemotherapy 1 week ago, but after doing labs noted that her liver enzymes were elevated.  Patient had not really noticed any yellowing of her skin or eyes until last 2 days or so.  At this time she reports having some mild left-sided back pain.  Denies having any significant fever, cough, shortness of breath, nausea, vomiting, or itching.  Lab work from 10/29 had noted alkaline phosphatase 785, AST 147, ALT 190, total bilirubin 1.4. Dr. Ewing Schlein of gastroenterology took patient for ERCP and attempted placement of biliary stent, but was unsuccessful.  Patient had been given 400  mg of the ciprofloxacin IV prior to the attempt at the procedure.  IR was consulted for need of percutaneous transhepatic biliary drain.  She had been given Cefoxitin IV.  Patient makes note plan is for her to undergo Whipple on November 18 with Dr. [name].   Hospital Course / Discharge diagnoses: Principal Problem:   Pancreatic cancer [facility]) Active Problems:   Biliary obstruction   Normocytic anemia   Paroxysmal atrial fibrillation (HCC)   Hypertensive urgency   Hyperlipidemia   GERD (gastroesophageal reflux disease)   Principal problem Pancreatic cancer, biliary obstruction -patient came into the hospital with elevated LFTs.  GI attempted an ERCP, biliary stent placement was unsuccessful.  IR was consulted and she is status post percutaneous transhepatic biliary drain on 11/5.  She improved after the procedure, she was taken back to the IR suite on 11/7 status post internalization across pancreatic tumor/CBD obstruction.  She recovered well following the procedure, external tube was capped, cleared by IR and will be discharged home in stable condition.  She is scheduled to have a Whipple's procedure on 11/17/2023 with Dr. [name].  Continue outpatient follow-up with IR, oncology as well as general surgery  Active problems Essential hypertension-continue benazepril PAF-currently in normal sinus rhythm, has been off Xarelto since initial diagnosis back in July.  Defer to outpatient Normocytic anemia-monitor, no bleeding Hyperlipidemia-resume statin now that LFTs are improving  Sepsis ruled out   Discharge Instructions   Allergies as of 11/06/2023       Reactions  Bactrim [sulfamethoxazole-trimethoprim] Nausea And Vomiting, Other (See Comments)   "Knocked her for a loop"- dizziness and weakness, also   Protonix [pantoprazole] Rash        Medication List     TAKE these medications    acetaminophen 500 MG tablet Commonly known as: TYLENOL Take 1,000 mg by mouth every 6 (six)  hours as needed for moderate pain (pain score 4-6) (ususally at night).   benazepril 20 MG tablet Commonly known as: LOTENSIN Take 20 mg by mouth daily.   cyanocobalamin 1000 MCG tablet Commonly known as: VITAMIN B12 Take 1,000 mcg by mouth daily.   docusate sodium 100 MG capsule Commonly known as: COLACE Take 1 capsule (100 mg total) by mouth 2 (two) times daily.   famotidine 40 MG tablet Commonly known as: PEPCID Take 40 mg by mouth daily.   ferrous sulfate 325 (65 FE) MG EC tablet Take 1 tablet (325 mg total) by mouth daily with breakfast.   lidocaine-prilocaine cream Commonly known as: EMLA Apply 1 Application topically as needed (apply to Port 1-2 hours prior to appointment).   multivitamin with minerals Tabs tablet Take 1 tablet by mouth daily.   ondansetron 4 MG tablet Commonly known as: ZOFRAN Take 1 tablet (4 mg total) by mouth every 6 (six) hours as needed for nausea.   oxyCODONE 5 MG immediate release tablet Commonly known as: Oxy IR/ROXICODONE Take 1 tablet (5 mg total) by mouth every 8 (eight) hours as needed for moderate pain (pain score 4-6).   polyethylene glycol 17 g packet Commonly known as: MIRALAX / GLYCOLAX Take 17 g by mouth daily as needed for mild constipation.   prochlorperazine 5 MG tablet Commonly known as: COMPAZINE Take 1 tablet (5 mg total) by mouth every 6 (six) hours as needed for nausea or vomiting.   simvastatin 40 MG tablet Commonly known as: ZOCOR Take 20 mg by mouth daily with supper.       Consultations: GI IR Surgery Oncology  Procedures/Studies:  IR CONVERT BILIARY DRAIN TO INT EXT BILIARY DRAIN  Result Date: 11/06/2023 INDICATION: 89 year old female presents for internal biliary drain placement. Previously she has undergone PTC with external biliary drain placement. EXAM: IMAGE GUIDED EXCHANGE OF BILIARY DRAIN AND PLACEMENT OF A NEW INTERNALIZED BILIARY DRAIN. MEDICATIONS: 2 g cefoxitin; The antibiotic was  administered within an appropriate time frame prior to the initiation of the procedure. ANESTHESIA/SEDATION: Moderate (conscious) sedation was employed during this procedure. A total of Versed 2.0 mg and Fentanyl 100 mcg was administered intravenously by the radiology nurse. Total intra-service moderate Sedation Time: 24 minutes. The patient's level of consciousness and vital signs were monitored continuously by radiology nursing throughout the procedure under my direct supervision. FLUOROSCOPY: Radiation Exposure Index (as provided by the fluoroscopic device): 38 mGy Kerma COMPLICATIONS: None PROCEDURE: The procedure, risks, benefits, and alternatives were explained to the patient and the patient's family. A complete informed consent was performed, with risk benefit analysis. Specific risks that were discussed for the procedure include bleeding, infection, biliary sepsis, IC use day, organ injury, need for further procedure, need for further surgery, long-term drain placement, cardiopulmonary collapse, death. Questions regarding the procedure were encouraged and answered. The patient understands and consents to the procedure. Patient is position in supine position on the fluoroscopy table, and the upper abdomen and indwelling drain were prepped and draped in the usual sterile fashion. Maximum barrier sterile technique with sterile gowns and gloves were used for the procedure. A timeout was performed prior to  the initiation of the procedure. Local anesthesia was provided with 1% lidocaine with epinephrine. Scout images were acquired. Contrast was injected through the indwelling externalized biliary drain confirming location. Catheter was ligated and the suture was cut. The drain was then removed on a Bentson wire. A 5 French 35 cm sheath was advanced on the Bentson wire into position. Dilator and the Bentson wire were removed. An 035 terumo navicross catheter was advanced on a stiff Glidewire and used to probe the  common bile duct occlusion. Ultimately we were able to push through the occlusion into the duodenum. Once the catheter was in position contrast was injected to confirm location. A rose in wire was advanced through the catheter. A 5 mm x 40 mm mustang balloon was advanced on the wire to the level of the occlusion. Balloon angioplasty was performed to create a space to advance the drain. Balloon was removed. Catheter and the sheath were removed. A new 12 [place] internal external biliary drain was advanced into position. The wire and the introducer were removed. Contrast was injected and final images were stored. Drain was sutured in position. The patient tolerated the procedure well and remained hemodynamically stable throughout. No complications were encountered and no significant blood loss was encountered. IMPRESSION: Status post image guided exchange of externalized biliary drain, for a new internal/external biliary drain after crossing common bile duct occlusion. Signed, Yvone Neu. Miachel Roux, RPVI [facility] Radiology Electronically Signed   By: Gilmer Mor D.O.   On: 11/06/2023 12:00   IR BILIARY DRAIN PLACEMENT WITH CHOLANGIOGRAM  Result Date: 11/05/2023 INDICATION: 89 year old female with a history of biliary obstruction at the pancreatic head, prior duodenal stent placement for gastric outlet obstruction, and recent failed ERCP. She presents for percutaneous transhepatic cholangiogram and possible drainage EXAM: PERCUTANEOUS TRANSHEPATIC CHOLANGIOGRAM AND DRAINAGE MEDICATIONS: 2 g cefoxitin; The antibiotic was administered within an appropriate time frame prior to the initiation of the procedure. ANESTHESIA/SEDATION: Moderate (conscious) sedation was employed during this procedure. A total of Versed 2.5 mg and Fentanyl 100 mcg was administered intravenously by the radiology nurse. Total intra-service moderate Sedation Time: 37 minutes. The patient's  level of consciousness and vital signs were monitored continuously by radiology nursing throughout the procedure under my direct supervision. FLUOROSCOPY: Radiation Exposure Index (as provided by the fluoroscopic device): 26 mGy Kerma COMPLICATIONS: None PROCEDURE: The procedure, risks, benefits, and alternatives were explained to the patient and the patient's family. A complete informed consent was performed, with risk benefit analysis. Specific risks that were discussed for the procedure include bleeding, infection, biliary sepsis, IC use day, organ injury, need for further procedure, need for further surgery, long-term drain placement, cardiopulmonary collapse, death. Questions regarding the procedure were encouraged and answered. The patient understands and consents to the procedure. Patient is position in supine position on the fluoroscopy table, and the upper abdomen was prepped and draped in the usual sterile fashion. Maximum barrier sterile technique with sterile gowns and gloves were used for the procedure. A timeout was performed prior to the initiation of the procedure. Local anesthesia was provided with 1% lidocaine with epinephrine. Ultrasound survey of the left liver lobe was performed, with then ultrasound of the right liver lobe. Window into the right biliary system was present for a intercostal approach. 1% lidocaine was used for local anesthesia, with generous infiltration of the skin and subcutaneous tissues in and inter left costal location. A Chiba needle was advanced under ultrasound guidance into the right  liver lobe. Once the tip of the needle was confirmed within the biliary system by injecting small aliquots of contrast, images were stored of the biliary system after partially opacifying the biliary tree via the needle. Z610 wire was advanced centrally. The needle was removed, a small incision was made with an 11 blade scalpel, and then a triaxial Accustick system was advanced into the  biliary system. The metal stiffener and dilator were removed, we confirmed placement with contrast infusion. Images were stored. A coaxial Glidewire and 4 French glide cath were then used to attempt to navigate across the obstruction at the common bile duct. Combination of stiff Glidewire and a roadrunner wire were used with the 4 [place] glide cath. We were unable to navigate through this obstruction at the time. We elected to withdraw given the patient's discomfort and significant fluoroscopy time. A standard 035 Bentson wire was advanced into the bile system. Dilation of the subcutaneous tissue tracks was performed with a 10 [place] dilator, and then a 57 [place] externalized standard pigtail drain was placed as an external biliary drain. Small amount of contrast confirmed location. Sample was sent for culture. The patient tolerated the procedure well and remained hemodynamically stable throughout. No complications were encountered and no significant blood loss was encountered. IMPRESSION: Status post image guided percutaneous transhepatic cholangiogram with externalized drain. Signed, Yvone Neu. Reyne Dumas, ABVM, RPVI [facility] Inter[facility] Radiology Special[facility] Radiology PLAN: We will allow 24-48 hours of decompression time, and planned for potentially a second attempt for internalization. Electronically Signed   By: Gilmer Mor D.O.   On: 11/05/2023 08:28   DG C-Arm 1-60 Min-No Report  Result Date: 11/04/2023 Fluoroscopy was utilized by the requesting physician.  No radiographic interpretation.   DG C-Arm 1-60 Min-No Report  Result Date: 11/04/2023 Fluoroscopy was utilized by the requesting physician.  No radiographic interpretation.   CT CHEST ABDOMEN PELVIS W CONTRAST  Result Date: 10/10/2023 CLINICAL DATA:  Restaging pancreatic cancer. * Tracking Code: BO * EXAM: CT CHEST, ABDOMEN, AND PELVIS WITH CONTRAST TECHNIQUE: Multidetector CT imaging of the chest, abdomen and pelvis was  performed following the standard protocol during bolus administration of intravenous contrast. RADIATION DOSE REDUCTION: This exam was performed according to the departmental dose-optimization program which includes automated exposure control, adjustment of the mA and/or kV according to patient size and/or use of iterative reconstruction technique. CONTRAST:  OMNIPAQUE IOHEXOL 300 MG/ML  SOLN COMPARISON:  Multiple exams, including 07/11/2023 and 07/07/2023 FINDINGS: CT CHEST FINDINGS Cardiovascular: Right Port-A-Cath tip: SVC. Coronary, aortic arch, and branch vessel atherosclerotic vascular disease. Mediastinum/Nodes: Hypodense 1.6 cm left thyroid nodule. Recommend thyroid US (ref: J Am Coll Radiol. 2015 Feb;12(2): 143-50). No pathologic adenopathy. Lungs/Pleura: Biapical pleuroparenchymal scarring. A few bilateral lower lobe nodules are present. These appear similar to prior. The largest nodule measures 6 by 5 mm in the right lower lobe on image 127 of series 4, stable. Musculoskeletal: Mild thoracic spondylosis. CT ABDOMEN PELVIS FINDINGS Hepatobiliary: No metastatic lesion to the liver is identified. Mild intrahepatic biliary dilatation is present with common hepatic duct dilated up to 1.2 cm and a tapered irregular appearance in the distal common bile duct in the vicinity of the pancreatic head. Pancreas: Prominently dilated dorsal pancreatic duct substantially worsened from prior, currently up to 1 cm in diameter, previously 0.3 cm. Upper uptake bring at the ampulla with other serpentine fluid signal intensity lesions along the pancreatic head likely representing dilated ventral duct. This may well be caused by the  pancreatic mass, but the expandable duodenal stent which traverses the region of the ampulla may be contributory as well. There is some distortion of the orientation of the pancreatic head and the mass due to the presence of a new expandable stent in the duodenum adjacent to mass. However,  compensating for this, the pancreatic mass appears reduced in size, I measure at and about 3.1 by 1.3 cm on image 81 of series 2, formerly up to 4.0 by 3.2 cm. Spleen: Unremarkable Adrenals/Urinary Tract: Unremarkable Stomach/Bowel: As noted above there is an expandable duodenal stent traversing the duodenal bulb and second portion of the duodenum, the stent is patent although there may be some epithelialization proximally. Stent caliber 2.4 cm. Vascular/Lymphatic: Atherosclerosis is present, including aortoiliac atherosclerotic disease. Narrowing of the proximal celiac trunk due to atheromatous vascular disease and median arcuate ligament. Narrowed proximal SMA nonspecific although soft plaque may be contributory. Neither vessel is overtly occluded. The portal vein, SMV, and splenic vein are patent without evidence of tumor invasion. No tumor directly around the SMA or celiac trunk. Reproductive: Unremarkable Other: No supplemental non-categorized findings. Musculoskeletal: Prominent degenerative hip arthropathy bilaterally. Mild lumbar spondylosis and degenerative disc disease. IMPRESSION: 1. The pancreatic mass appears reduced in size, currently 3.1 by 1.3 cm, formerly up to 4.0 by 3.2 cm. However, the new duodenal stent in this region may be changing the orientation of the mass. 2. The pancreatic duct caliber is substantially increased from prior, currently up to 1 cm in diameter, previously 0.3 cm. There is also new biliary dilatation. This may well be caused by the pancreatic mass, but the expandable duodenal stent which traverses the region of the ampulla may be contributory as well. 3. A few bilateral lower lobe nodules are present, the largest measuring 6 by 5 mm in the right lower lobe, stable. Surveillance recommended. 4. Hypodense 1.6 cm left thyroid nodule. Recommend thyroid US. 5. Aortic atherosclerosis. Aortic Atherosclerosis (ICD10-I70.0). Electronically Signed   By: Gaylyn Rong M.D.   On:  10/10/2023 17:21     Subjective: - no chest pain, shortness of breath, no abdominal pain, nausea or vomiting.   Discharge Exam: BP (!) 144/76 (BP Location: Left Arm)   Pulse 99   Temp 98.1 F (36.7 C)   Resp 18   Ht 5\' 6"  (1.676 m)   Wt 53.5 kg   SpO2 100%   BMI 19.04 kg/m   General: Pt is alert, awake, not in acute distress Cardiovascular: RRR, S1/S2 +, no rubs, no gallops Respiratory: CTA bilaterally, no wheezing, no rhonchi Abdominal: Soft, NT, ND, bowel sounds + Extremities: no edema, no cyanosis  The results of significant diagnostics from this hospitalization (including imaging, microbiology, ancillary and laboratory) are listed below for reference.     Microbiology: Recent Results (from the past 240 hour(s))  Aerobic/Anaerobic Culture w Gram Stain (surgical/deep wound)     Status: None (Preliminary result)   Collection Time: 11/04/23  4:52 PM   Specimen: Gallbladder; Bile  Result Value Ref Range Status   Specimen Description GALL BLADDER  Final   Special Requests NONE  Final   Gram Stain NO WBC SEEN NO ORGANISMS SEEN   Final   Culture   Final    NO GROWTH 2 DAYS NO ANAEROBES ISOLATED; CULTURE IN PROGRESS FOR 5 DAYS Performed at [facility], [address] [place], [place] [address]    Report Status PENDING  Incomplete     Labs: Basic Metabolic Panel: Recent Labs  Lab  11/04/23 0729 11/05/23 0450 11/06/23 0659  NA 138 133* 136  K 3.5 4.2 4.4  CL 104 106 105  CO2 23 19* 23  GLUCOSE 122* 137* 117*  BUN 14 24* 24*  CREATININE 0.69 0.93 0.74  CALCIUM 9.9 9.6 9.5  MG  --   --  1.8   Liver Function Tests: Recent Labs  Lab 11/04/23 0729 11/05/23 0450 11/06/23 0659  AST 191* 140* 86*  ALT 220* 185* 147*  ALKPHOS 755* 658* 553*  BILITOT 6.0* 2.6* 2.0*  PROT 6.2* 5.6* 5.5*  ALBUMIN 3.3* 2.9* 3.0*   CBC: Recent Labs  Lab 11/04/23 1334 11/05/23 0450 11/06/23 0659  WBC 6.6 6.9 7.0  HGB 11.9* 10.2* 10.2*  HCT 36.8 31.8* 32.2*  MCV  91.3 91.1 92.3  PLT 348 295 280   CBG: No results for input(s): "GLUCAP" in the last 168 hours. Hgb A1c No results for input(s): "HGBA1C" in the last 72 hours. Lipid Profile No results for input(s): "CHOL", "HDL", "LDLCALC", "TRIG", "CHOLHDL", "LDLDIRECT" in the last 72 hours. Thyroid function studies No results for input(s): "TSH", "T4TOTAL", "T3FREE", "THYROIDAB" in the last 72 hours.  Invalid input(s): "FREET3" Urinalysis    Component Value Date/Time   COLORURINE YELLOW 07/07/2023 1415   APPEARANCEUR CLEAR 07/07/2023 1415   LABSPEC 1.029 07/07/2023 1415   PHURINE 5.0 07/07/2023 1415   GLUCOSEU NEGATIVE 07/07/2023 1415   HGBUR NEGATIVE 07/07/2023 1415   BILIRUBINUR NEGATIVE 07/07/2023 1415   KETONESUR NEGATIVE 07/07/2023 1415   PROTEINUR NEGATIVE 07/07/2023 1415   NITRITE NEGATIVE 07/07/2023 1415   LEUKOCYTESUR SMALL (A) 07/07/2023 1415    FURTHER DISCHARGE INSTRUCTIONS:   Get Medicines reviewed and adjusted: Please take all your medications with you for your next visit with your Primary MD   Laboratory/radiological data: Please request your Primary MD to go over all hospital tests and procedure/radiological results at the follow up, please ask your Primary MD to get all Hospital records sent to his/her office.   In some cases, they will be blood work, cultures and biopsy results pending at the time of your discharge. Please request that your primary care M.D. goes through all the records of your hospital data and follows up on these results.   Also Note the following: If you experience worsening of your admission symptoms, develop shortness of breath, life threatening emergency, suicidal or homicidal thoughts you must seek medical attention immediately by calling 911 or calling your MD immediately  if symptoms less severe.   You must read complete instructions/literature along with all the possible adverse reactions/side effects for all the Medicines you take and that  have been prescribed to you. Take any new Medicines after you have completely understood and accpet all the possible adverse reactions/side effects.    Do not drive when taking Pain medications or sleeping medications (Benzodaizepines)   Do not take more than prescribed Pain, Sleep and Anxiety Medications. It is not advisable to combine anxiety,sleep and pain medications without talking with your primary care practitioner   Special Instructions: If you have smoked or chewed Tobacco  in the last 2 yrs please stop smoking, stop any regular Alcohol  and or any Recreational drug use.   Wear Seat belts while driving.   Please note: You were cared for by a hospitalist during your hospital stay. Once you are discharged, your primary care physician will handle any further medical issues. Please note that NO REFILLS for any discharge medications will be authorized once you are discharged, as  it is imperative that you return to your primary care physician (or establish a relationship with a primary care physician if you do not have one) for your post hospital discharge needs so that they can reassess your need for medications and monitor your lab values.  Time coordinating discharge: 40 minutes  SIGNED:  Pamella Pert, MD, PhD 11/06/2023, 4:07 PM

## 2023-11-07 DIAGNOSIS — G8918 Other acute postprocedural pain: Secondary | ICD-10-CM | POA: Diagnosis not present

## 2023-11-09 LAB — AEROBIC/ANAEROBIC CULTURE W GRAM STAIN (SURGICAL/DEEP WOUND)
Culture: NO GROWTH
Gram Stain: NONE SEEN

## 2023-11-11 ENCOUNTER — Inpatient Hospital Stay: Payer: Medicare Other | Attending: Oncology

## 2023-11-11 ENCOUNTER — Other Ambulatory Visit (HOSPITAL_COMMUNITY): Payer: Self-pay | Admitting: Student

## 2023-11-11 ENCOUNTER — Inpatient Hospital Stay (HOSPITAL_BASED_OUTPATIENT_CLINIC_OR_DEPARTMENT_OTHER): Payer: Medicare Other | Admitting: Nurse Practitioner

## 2023-11-11 ENCOUNTER — Inpatient Hospital Stay: Payer: Medicare Other

## 2023-11-11 ENCOUNTER — Encounter: Payer: Self-pay | Admitting: Nurse Practitioner

## 2023-11-11 VITALS — BP 126/71 | HR 87 | Temp 98.1°F | Resp 16 | Ht 66.0 in | Wt 118.5 lb

## 2023-11-11 DIAGNOSIS — E042 Nontoxic multinodular goiter: Secondary | ICD-10-CM | POA: Diagnosis not present

## 2023-11-11 DIAGNOSIS — Z7901 Long term (current) use of anticoagulants: Secondary | ICD-10-CM | POA: Diagnosis not present

## 2023-11-11 DIAGNOSIS — E785 Hyperlipidemia, unspecified: Secondary | ICD-10-CM | POA: Insufficient documentation

## 2023-11-11 DIAGNOSIS — Z95828 Presence of other vascular implants and grafts: Secondary | ICD-10-CM

## 2023-11-11 DIAGNOSIS — C250 Malignant neoplasm of head of pancreas: Secondary | ICD-10-CM | POA: Diagnosis present

## 2023-11-11 DIAGNOSIS — K831 Obstruction of bile duct: Secondary | ICD-10-CM

## 2023-11-11 DIAGNOSIS — I4891 Unspecified atrial fibrillation: Secondary | ICD-10-CM | POA: Insufficient documentation

## 2023-11-11 DIAGNOSIS — E538 Deficiency of other specified B group vitamins: Secondary | ICD-10-CM | POA: Insufficient documentation

## 2023-11-11 DIAGNOSIS — R918 Other nonspecific abnormal finding of lung field: Secondary | ICD-10-CM | POA: Diagnosis not present

## 2023-11-11 DIAGNOSIS — E611 Iron deficiency: Secondary | ICD-10-CM | POA: Insufficient documentation

## 2023-11-11 LAB — CBC WITH DIFFERENTIAL (CANCER CENTER ONLY)
Abs Immature Granulocytes: 0.03 10*3/uL (ref 0.00–0.07)
Basophils Absolute: 0.1 10*3/uL (ref 0.0–0.1)
Basophils Relative: 1 %
Eosinophils Absolute: 0.1 10*3/uL (ref 0.0–0.5)
Eosinophils Relative: 2 %
HCT: 32.8 % — ABNORMAL LOW (ref 36.0–46.0)
Hemoglobin: 10.4 g/dL — ABNORMAL LOW (ref 12.0–15.0)
Immature Granulocytes: 0 %
Lymphocytes Relative: 10 %
Lymphs Abs: 0.7 10*3/uL (ref 0.7–4.0)
MCH: 29 pg (ref 26.0–34.0)
MCHC: 31.7 g/dL (ref 30.0–36.0)
MCV: 91.4 fL (ref 80.0–100.0)
Monocytes Absolute: 0.9 10*3/uL (ref 0.1–1.0)
Monocytes Relative: 13 %
Neutro Abs: 5.4 10*3/uL (ref 1.7–7.7)
Neutrophils Relative %: 74 %
Platelet Count: 316 10*3/uL (ref 150–400)
RBC: 3.59 MIL/uL — ABNORMAL LOW (ref 3.87–5.11)
RDW: 17.5 % — ABNORMAL HIGH (ref 11.5–15.5)
WBC Count: 7.1 10*3/uL (ref 4.0–10.5)
nRBC: 0 % (ref 0.0–0.2)

## 2023-11-11 LAB — CMP (CANCER CENTER ONLY)
ALT: 56 U/L — ABNORMAL HIGH (ref 0–44)
AST: 41 U/L (ref 15–41)
Albumin: 3.8 g/dL (ref 3.5–5.0)
Alkaline Phosphatase: 343 U/L — ABNORMAL HIGH (ref 38–126)
Anion gap: 5 (ref 5–15)
BUN: 16 mg/dL (ref 8–23)
CO2: 29 mmol/L (ref 22–32)
Calcium: 9.7 mg/dL (ref 8.9–10.3)
Chloride: 97 mmol/L — ABNORMAL LOW (ref 98–111)
Creatinine: 0.53 mg/dL (ref 0.44–1.00)
GFR, Estimated: >60 mL/min (ref >60)
Glucose, Bld: 118 mg/dL — ABNORMAL HIGH (ref 70–99)
Potassium: 4.6 mmol/L (ref 3.5–5.1)
Sodium: 131 mmol/L — ABNORMAL LOW (ref 135–145)
Total Bilirubin: 1.3 mg/dL — ABNORMAL HIGH (ref <1.2)
Total Protein: 6.5 g/dL (ref 6.5–8.1)

## 2023-11-11 MED ORDER — OXYCODONE HCL 5 MG PO TABS: 5.0000 mg | ORAL_TABLET | Freq: Four times a day (QID) | ORAL | 0 refills | Status: DC | PRN

## 2023-11-11 NOTE — Patient Instructions (Signed)
 [employer], Adult A central line is a long, thin tube (catheter) that can be used to collect blood for testing or to give medicine through a vein. The tip of the central line ends in a large vein just above the heart (vena cava). A central line may be placed because: You need to get medicines or fluids through an IV for a long period of time. You need nutrition but cannot eat or absorb nutrients. The veins in your hands or arms are difficult to use for IV access. You need a blood transfusion. You need chemotherapy or dialysis. Types of central lines There are four main types of central lines: Peripherally inserted central catheter (PICC) line. This type is used for access of one week or longer. It can be used to draw blood and give fluids or medicines. A PICC looks like an IV tube, but it goes up the arm to the heart. It is usually inserted in the upper arm and taped in place on the arm. Tunneled central line. This type is used for long-term therapy and dialysis. It is placed in a large vein in the neck, chest, or groin. It is inserted through a small incision made over the vein, and then it is advanced to the heart. It is tunneled under the skin and brought out through a second incision. Non-tunneled central line. This type is used for short-term access, usually for a maximum of 7 days. It is often used in the emergency department. It is inserted in the neck, chest, or groin. Implanted port. This type is used for long-term therapy. It can stay in place longer than other types of central lines. It is normally inserted in the upper chest, but it can also be placed in the upper arm or the abdomen. It is inserted and removed with surgery, and it is accessed using a needle. The type of central line that you receive depends on how long you will need it, your medical condition, and the condition of your veins. Tell a health care provider about: Any allergies you have. All medicines you are taking,  including vitamins, herbs, eye drops, creams, and over-the-counter medicines. Any problems you or family members have had with anesthetic medicines. Any blood disorders you have. Any surgeries you have had. Any medical conditions you have. Whether you are pregnant or may be pregnant. What are the risks? Generally, placement and use of a central line is safe. However, problems may occur, including: Infection. A blood clot that blocks the central line or forms in the vein and travels to the heart. Bleeding from the place where the central line was inserted. Developing a hole or crack within the central line. If this happens, the central line will need to be replaced. Central line failure. The catheter moving or coming out of place. What happens before the procedure? Medicines Ask your health care provider about: Changing or stopping your regular medicines. This is especially important if you are taking diabetes medicines or blood thinners. Taking medicines such as aspirin and ibuprofen. These medicines can thin your blood. Do not take these medicines unless your health care provider tells you to take them. Taking over-the-counter medicines, vitamins, herbs, and supplements. General instructions Follow instructions from your health care provider about eating or drinking restrictions. Ask your health care provider: How your procedure site will be marked. What steps will be taken to help prevent infection. These steps may include: Removing hair at the procedure site. Washing skin with a germ-killing soap.  Plan to have a responsible adult take you home from the hospital or clinic. If you will be going home right after the procedure, plan to have a responsible adult care for you for the time you are told. This is important. What happens during the procedure? The procedure will vary depending on the type of central line being placed. In general: An IV will be inserted into one of your  veins. You will be given one or more of the following: A medicine to help you relax (sedative). A medicine to numb the area (local anesthetic). Your skin will be cleaned with a germ-killing (antiseptic) solution, and you may be covered with sterile drapes. Your blood pressure, heart rate, breathing rate, and blood oxygen level will be monitored during the procedure. The central line catheter will be inserted into the vein and advanced to the correct spot. The health care provider may use X-ray equipment to help guide the catheter to the right place. A bandage (dressing) will be placed over the insertion area. The procedure may vary among health care providers and hospitals. What can I expect after the procedure? Your blood pressure, heart rate, breathing rate, and blood oxygen level will be monitored until you leave the hospital or clinic. Antiseptic caps may be placed on the ends of the central line tubing. If you were given a sedative during the procedure, it can affect you for several hours. Do not drive or operate machinery until your health care provider says that it is safe. Follow these instructions at home: Flushing and cleaning the central line  Follow instructions from your health care provider about flushing and cleaning the central line and the area around it. Only use sterile supplies to flush the central line. Use supplies from your health care provider, a pharmacy, or another source that is recommended by your health care provider. Before you flush the central line or clean the central line or the area around it: Wash your hands with soap and water for at least 20 seconds. If soap and water are not available, use alcohol-based hand sanitizer. Clean the central line hub with rubbing alcohol. Unless directed otherwise by the manufacturer's instructions, scrub using a twisting motion and rub for 10 to 15 seconds or for 30 twists. Be sure you scrub the top of the hub, not just the  sides. Never reuse alcohol pads. Let the hub dry before use. Prevent it from touching anything while drying. Caring for the incision or central line site Check your incision or central line site every day for signs of infection. Check for: Redness, swelling, or pain. Fluid or blood. Warmth. Pus or a bad smell. Keep the insertion site of your central line clean and dry at all times. Change your dressing only as told by your health care provider. Keep your dressing dry. If it gets wet, have it changed as soon as possible. General instructions Follow instructions from your health care provider for the type of device that you have. Keep the tube clamped, unless it is being used. If the central line accidentally gets pulled on, make sure: The dressing is okay. There is no bleeding. The line has not been pulled out. Do not use scissors or sharp objects near the tube. Do not take baths, swim, or use a hot tub until your health care provider approves. Ask your health care provider if you may take showers. You may only be allowed to take sponge baths. Ask your health care provider what activities are  safe for you. You may be restricted from lifting or making repetitive arm movements on the side of your central line. Take over-the-counter and prescription medicines only as told by your health care provider. Keep all follow-up visits. This is important. Storage and disposal of supplies Keep your supplies in a clean, dry location. Throw away any used syringes in a disposal container that is meant for sharp items (sharps container). You can buy a sharps container from a pharmacy, or you can make one by using an empty hard plastic bottle with a cover. Place any used dressings or infusion bags into a plastic bag. Throw that bag in the trash. Contact a health care provider if: You have redness, swelling, or pain around your insertion site. You have fluid or blood coming from your insertion site. Your  insertion site feels warm to the touch. You have pus or a bad smell coming from your insertion site. Get help right away if: You have: A fever or chills. Shortness of breath. Chest pain or a racing heartbeat. Swelling in your neck, face, chest, or arm on the side of your central line. You feel dizzy or you faint. Your incision or central line site has red streaks spreading away from the area. Your incision or central line site is bleeding and does not stop. Your central line is difficult to flush or will not flush. You do not get a blood return from the central line. Your central line gets loose or damaged or comes out. Your catheter leaks when flushed or when fluids are infused into it. Summary A central line is a long, thin tube (catheter) that can be used to give medicine through a vein. Follow specific instructions from your health care provider for the type of device that you have. Keep the insertion site of your central line clean and dry at all times. Keep the tube clamped, unless it is being used. This information is not intended to replace advice given to you by your health care provider. Make sure you discuss any questions you have with your health care provider. Document Revised: 08/16/2020 Document Reviewed: 08/17/2020 Elsevier Patient Education  2024 [employer].

## 2023-11-11 NOTE — Progress Notes (Signed)
Surgical Instructions   Your procedure is scheduled on Monday, November 17, 2023. Report to [facility] Main Entrance "A" at 7:30 A.M., then check in with the Admitting office. Any questions or running late day of surgery: call [phone]  Questions prior to your surgery date: call [phone], Monday-Friday, 8am-4pm. If you experience any cold or flu symptoms such as cough, fever, chills, shortness of breath, etc. between now and your scheduled surgery, please notify us at the above number.     Remember:  Do not eat after midnight the night before your surgery   You may drink clear liquids until 6:30 the morning of your surgery.   Clear liquids allowed are: Water, Non-Citrus Juices (without pulp), Carbonated Beverages, Clear Tea (no milk, honey, etc.), Black Coffee Only (NO MILK, CREAM OR POWDERED CREAMER of any kind), and Gatorade.  Patient Instructions  The night before surgery:  No food after midnight. ONLY clear liquids after midnight  The day of surgery (if you do NOT have diabetes):  Drink ONE (1) Pre-Surgery Clear Ensure by 6:30 the morning of surgery. Drink in one sitting. Do not sip.  This drink was given to you during your hospital  pre-op appointment visit.  Nothing else to drink after completing the  Pre-Surgery Clear Ensure.          If you have questions, please contact your surgeon's office.     Take these medicines the morning of surgery with A SIP OF WATER  famotidine (PEPCID)  simvastatin (ZOCOR)   May take these medicines IF NEEDED: acetaminophen (TYLENOL)  ondansetron (ZOFRAN)  oxyCODONE (OXY IR/ROXICODONE)  prochlorperazine (COMPAZINE)    One week prior to surgery, STOP taking any Aspirin (unless otherwise instructed by your surgeon) Aleve, Naproxen, Ibuprofen, Motrin, Advil, Goody's, BC's, all herbal medications, fish oil, and non-prescription vitamins.                     Do NOT Smoke (Tobacco/Vaping) for 24 hours prior to your  procedure.  If you use a CPAP at night, you may bring your mask/headgear for your overnight stay.   You will be asked to remove any contacts, glasses, piercing's, hearing aid's, dentures/partials prior to surgery. Please bring cases for these items if needed.    Patients discharged the day of surgery will not be allowed to drive home, and someone needs to stay with them for 24 hours.  SURGICAL WAITING ROOM VISITATION Patients may have no more than 2 support people in the waiting area - these visitors may rotate.   Pre-op nurse will coordinate an appropriate time for 1 ADULT support person, who may not rotate, to accompany patient in pre-op.  Children under the age of 40 must have an adult with them who is not the patient and must remain in the main waiting area with an adult.  If the patient needs to stay at the hospital during part of their recovery, the visitor guidelines for inpatient rooms apply.  Please refer to the [facility] website for the visitor guidelines for any additional information.   If you received a COVID test during your pre-op visit  it is requested that you wear a mask when out in public, stay away from anyone that may not be feeling well and notify your surgeon if you develop symptoms. If you have been in contact with anyone that has tested positive in the last 10 days please notify you surgeon.      Pre-operative CHG Bathing Instructions  You can play a key role in reducing the risk of infection after surgery. Your skin needs to be as free of germs as possible. You can reduce the number of germs on your skin by washing with CHG (chlorhexidine gluconate) soap before surgery. CHG is an antiseptic soap that kills germs and continues to kill germs even after washing.   DO NOT use if you have an allergy to chlorhexidine/CHG or antibacterial soaps. If your skin becomes reddened or irritated, stop using the CHG and notify one of our RNs at [phone].               TAKE A SHOWER THE NIGHT BEFORE SURGERY AND THE DAY OF SURGERY    Please keep in mind the following:  DO NOT shave, including legs and underarms, 48 hours prior to surgery.   You may shave your face before/day of surgery.  Place clean sheets on your bed the night before surgery Use a clean washcloth (not used since being washed) for each shower. DO NOT sleep with pet's night before surgery.  CHG Shower Instructions:  Wash your face and private area with normal soap. If you choose to wash your hair, wash first with your normal shampoo.  After you use shampoo/soap, rinse your hair and body thoroughly to remove shampoo/soap residue.  Turn the water OFF and apply half the bottle of CHG soap to a CLEAN washcloth.  Apply CHG soap ONLY FROM YOUR NECK DOWN TO YOUR TOES (washing for 3-5 minutes)  DO NOT use CHG soap on face, private areas, open wounds, or sores.  Pay special attention to the area where your surgery is being performed.  If you are having back surgery, having someone wash your back for you may be helpful. Wait 2 minutes after CHG soap is applied, then you may rinse off the CHG soap.  Pat dry with a clean towel  Put on clean pajamas    Additional instructions for the day of surgery: DO NOT APPLY any lotions, deodorants or perfumes.   Do not wear jewelry or makeup Do not wear nail polish, gel polish, artificial nails, or any other type of covering on natural nails (fingers and toes) Do not bring valuables to the hospital. [facility] is not responsible for valuables/personal belongings. Put on clean/comfortable clothes.  Please brush your teeth.  Ask your nurse before applying any prescription medications to the skin.

## 2023-11-11 NOTE — Progress Notes (Signed)
[facility] OFFICE PROGRESS NOTE   Diagnosis: Pancreas cancer  INTERVAL HISTORY:   Ms. Wendy [name] returns as scheduled.  She completed cycle 6 gemcitabine/Abraxane 10/13/2023.  Chemotherapy was held 10/28/2023 due to marked elevation of LFTs.  She underwent placement of an external biliary drain 11/04/2023.  On 11/06/2023 new internal/external biliary drain placed.  She reports significant pain at the drain site as well as the mid to low back since the procedure 11/06/2023.  She has tried oxycodone 5 mg with no relief.  She denies nausea/vomiting.  No fever.  She had a bowel movement yesterday after taking magnesium citrate.  She describes her appetite as "fair".  Objective:  Vital signs in last 24 hours:  Blood pressure 126/71, pulse 87, temperature 98.1 F (36.7 C), temperature source Temporal, resp. rate 16, height 5\' 6"  (1.676 m), weight 118 lb 8 oz (53.8 kg), SpO2 100%.    HEENT: No thrush or ulcers. Resp: Lungs clear bilaterally. Cardio: Regular rate and rhythm. GI: Right upper quadrant biliary drain site with thick brown drainage.  Surrounding tenderness.  Also tender at the right lower abdomen. Vascular: No leg edema. Port-A-Cath without erythema.  Lab Results:  Lab Results  Component Value Date   WBC 7.1 11/11/2023   HGB 10.4 (L) 11/11/2023   HCT 32.8 (L) 11/11/2023   MCV 91.4 11/11/2023   PLT 316 11/11/2023   NEUTROABS 5.4 11/11/2023    Imaging:  No results found.  Medications: I have reviewed the patient's current medications.  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  Fluid-filled and distended stomach and proximal duodenum, single 11 mm periportal  lymph node EGD 07/08/2023-large food residue in the stomach, extrinsic deformity at the first portion of the duodenum-biopsied Elevated CA 19-9 CT chest 07/11/2023-scattered small pulmonary nodules-indeterminate Cycle 1 gemcitabine/Abraxane 08/05/2023 Cycle 2 gemcitabine/Abraxane 08/19/2023 Cycle 3 gemcitabine/Abraxane 09/02/2023 Cycle 4 gemcitabine/Abraxane 09/16/2023 Cycle 5 gemcitabine/Abraxane 09/30/2023 CTs 10/09/2023-increase size of pancreas mass, increased pancreatic duct caliber and new biliary dilation, bilateral lower lobe nodules are stable, hypodense left thyroid nodule Cycle 6 gemcitabine/Abraxane 10/13/2023 10/28/2023 chemotherapy held due to LFT abnormalities Biliary drain placed 11/04/2023; internal/external biliary drain 11/06/2023   2.   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   Anemia 4.   Iron deficiency 5.   B12 deficiency 6.   Atrial fibrillation-maintained on Xarelto prior to hospital admission 7.  Family history of pancreas and uterine cancer 8.  Hyperlipidemia 9.  Genetics-negative  Disposition: Ms. Harner has completed 6 cycles of gemcitabine/Abraxane.  Chemotherapy was held 2 weeks ago due to liver function abnormalities.  She was found to have biliary obstruction.  She has an internal/external biliary drain at present.  Review of LFTs today show improvement.  She is having considerable pain likely related to the biliary drain.  A new prescription was sent to her pharmacy for oxycodone 1 to 2 tablets every 6 hours as needed.  We are contacting the drain clinic at [facility] for evaluation.  Her performance status has declined.  It is not clear if she is a candidate for surgery in her current condition.  We are contacting Dr. Eliot Ford office for an appointment later this week.  Next scheduled visit in our office is  2 weeks.  We will adjust accordingly pending the decision on surgery which is scheduled  11/17/2023.  Patient seen with Dr. Truett Perna.  Lonna Cobb ANP/GNP-BC   11/11/2023  12:04 PM This was a shared visit with Lonna Cobb.  Ms. Critton was interviewed and examined.  She developed elevated liver enzymes over the past few weeks and underwent placement of a drain last week.  She has discomfort and drainage around the right upper quadrant drain site.  We asked her to uncap the drain.  We will arrange for follow-up in the interventional radiology clinic.  Her performance status has declined over the past few weeks.  It is unclear whether she will be a surgical candidate.  We will ask Dr. [name] to evaluate her this week.  I was present for greater than 50% of today's visit.  I performed medical decision making.  Mancel Bale, MD

## 2023-11-12 ENCOUNTER — Other Ambulatory Visit (HOSPITAL_COMMUNITY): Payer: Self-pay | Admitting: Student

## 2023-11-12 ENCOUNTER — Ambulatory Visit (HOSPITAL_COMMUNITY)
Admission: RE | Admit: 2023-11-12 | Discharge: 2023-11-12 | Disposition: A | Discharge status: Home / Self Care | Payer: Medicare Other | Source: Ambulatory Visit | Attending: Student | Admitting: Student

## 2023-11-12 ENCOUNTER — Other Ambulatory Visit: Payer: Self-pay

## 2023-11-12 ENCOUNTER — Encounter (HOSPITAL_COMMUNITY)
Admission: RE | Admit: 2023-11-12 | Discharge: 2023-11-12 | Disposition: A | Discharge status: Home / Self Care | Payer: Medicare Other | Source: Ambulatory Visit | Attending: Surgery | Admitting: Surgery

## 2023-11-12 ENCOUNTER — Encounter (HOSPITAL_COMMUNITY): Payer: Self-pay

## 2023-11-12 ENCOUNTER — Other Ambulatory Visit (HOSPITAL_COMMUNITY): Payer: Self-pay | Admitting: Surgery

## 2023-11-12 VITALS — BP 127/70 | HR 94 | Temp 98.4°F | Resp 16 | Ht 66.0 in | Wt 119.3 lb

## 2023-11-12 DIAGNOSIS — I10 Essential (primary) hypertension: Secondary | ICD-10-CM | POA: Insufficient documentation

## 2023-11-12 DIAGNOSIS — K219 Gastro-esophageal reflux disease without esophagitis: Secondary | ICD-10-CM | POA: Insufficient documentation

## 2023-11-12 DIAGNOSIS — E785 Hyperlipidemia, unspecified: Secondary | ICD-10-CM | POA: Insufficient documentation

## 2023-11-12 DIAGNOSIS — Z01812 Encounter for preprocedural laboratory examination: Secondary | ICD-10-CM | POA: Insufficient documentation

## 2023-11-12 DIAGNOSIS — C259 Malignant neoplasm of pancreas, unspecified: Secondary | ICD-10-CM | POA: Insufficient documentation

## 2023-11-12 DIAGNOSIS — K831 Obstruction of bile duct: Secondary | ICD-10-CM | POA: Diagnosis present

## 2023-11-12 DIAGNOSIS — Z800 Family history of malignant neoplasm of digestive organs: Secondary | ICD-10-CM | POA: Insufficient documentation

## 2023-11-12 DIAGNOSIS — I4891 Unspecified atrial fibrillation: Secondary | ICD-10-CM | POA: Insufficient documentation

## 2023-11-12 DIAGNOSIS — C801 Malignant (primary) neoplasm, unspecified: Secondary | ICD-10-CM | POA: Diagnosis not present

## 2023-11-12 DIAGNOSIS — T85638A Leakage of other specified internal prosthetic devices, implants and grafts, initial encounter: Secondary | ICD-10-CM | POA: Diagnosis not present

## 2023-11-12 HISTORY — PX: IR EXCHANGE BILIARY DRAIN: IMG6046

## 2023-11-12 HISTORY — DX: Cardiac arrhythmia, unspecified: I49.9

## 2023-11-12 HISTORY — PX: IR CHOLANGIOGRAM EXISTING TUBE: IMG6040

## 2023-11-12 HISTORY — DX: Gastro-esophageal reflux disease without esophagitis: K21.9

## 2023-11-12 LAB — CBC
HCT: 33.6 % — ABNORMAL LOW (ref 36.0–46.0)
Hemoglobin: 10.3 g/dL — ABNORMAL LOW (ref 12.0–15.0)
MCH: 28.9 pg (ref 26.0–34.0)
MCHC: 30.7 g/dL (ref 30.0–36.0)
MCV: 94.1 fL (ref 80.0–100.0)
Platelets: 323 10*3/uL (ref 150–400)
RBC: 3.57 MIL/uL — ABNORMAL LOW (ref 3.87–5.11)
RDW: 17.1 % — ABNORMAL HIGH (ref 11.5–15.5)
WBC: 8.4 10*3/uL (ref 4.0–10.5)
nRBC: 0 % (ref 0.0–0.2)

## 2023-11-12 LAB — BASIC METABOLIC PANEL
Anion gap: 7 (ref 5–15)
BUN: 17 mg/dL (ref 8–23)
CO2: 27 mmol/L (ref 22–32)
Calcium: 9.7 mg/dL (ref 8.9–10.3)
Chloride: 97 mmol/L — ABNORMAL LOW (ref 98–111)
Creatinine, Ser: 0.93 mg/dL (ref 0.44–1.00)
GFR, Estimated: 59 mL/min — ABNORMAL LOW (ref >60)
Glucose, Bld: 132 mg/dL — ABNORMAL HIGH (ref 70–99)
Potassium: 4.5 mmol/L (ref 3.5–5.1)
Sodium: 131 mmol/L — ABNORMAL LOW (ref 135–145)

## 2023-11-12 LAB — CANCER ANTIGEN 19-9: CA 19-9: 199 U/mL — ABNORMAL HIGH (ref 0–35)

## 2023-11-12 MED ORDER — IOHEXOL 300 MG/ML  SOLN
50.0000 mL | Freq: Once | INTRAMUSCULAR | Status: AC | PRN
Start: 2023-11-12 — End: 2023-11-12
  Administered 2023-11-12: 25 mL

## 2023-11-12 NOTE — Procedures (Signed)
Pre procedural Dx: Peri-catheter leakage Post procedural Dx: Same  Drain injection demonstrates stable positioning of recently converted biliary drainage catheter, initially without passage of contrast to the level of the duodenum. The drain obstruction was cleared with wire debridement with subsequent passage of contrast to the level of the duodenum.  EBL: None Complications: None immediate  PLAN: - Drain connected to gravity bag.  Pt's family instructed to flush the drain with 10 cc NS BID. - Pt's family instructed to initiate a trial of internalization in 24 hrs IF pericatheter leakage ceases. - IF leakage recurs, the pt's family was instructed to re-connect to a gravity bag.   Katherina Right, MD Pager #: [phone]

## 2023-11-12 NOTE — Progress Notes (Addendum)
PCP - [name], MD  Cardiologist -  [name], [name] M, MD (last office visit in Aug. 2024)  PPM/ICD - denies Device Orders - n/a Rep Notified - n/a  Chest x-ray - 08-04-23 EKG - 07-08-23 Stress Test - denies ECHO - 10-14-19 Cardiac Cath - denies  Sleep Study - denies CPAP - n/a  DM -denies  Blood Thinner Instructions: denies Aspirin Instructions: n/a  ERAS Protcol - Ensure clear liquids until 6:30 PRE-SURGERY Ensure     COVID TEST- no Patient noted to have bilary drain to right lower abdomen  Anesthesia review: yes HTN, recent admit for biliary stent. Patient also has a follow up with Dr. [name] today, per patient "cancer Dr. does not feel she is strong enough for this surgery at this time now"  Patient denies shortness of breath, fever, cough and chest pain at PAT appointment   All instructions explained to the patient, with a verbal understanding of the material. Patient agrees to go over the instructions while at home for a better understanding. Patient also instructed to self quarantine after being tested for COVID-19. The opportunity to ask questions was provided.

## 2023-11-13 ENCOUNTER — Ambulatory Visit (HOSPITAL_COMMUNITY)
Admission: RE | Admit: 2023-11-13 | Discharge: 2023-11-13 | Disposition: A | Discharge status: Home / Self Care | Payer: Medicare Other | Source: Ambulatory Visit | Attending: Surgery | Admitting: Surgery

## 2023-11-13 ENCOUNTER — Encounter (HOSPITAL_COMMUNITY): Payer: Self-pay | Admitting: Student

## 2023-11-13 ENCOUNTER — Other Ambulatory Visit: Payer: Self-pay | Admitting: Oncology

## 2023-11-13 DIAGNOSIS — C259 Malignant neoplasm of pancreas, unspecified: Secondary | ICD-10-CM | POA: Insufficient documentation

## 2023-11-13 DIAGNOSIS — K8689 Other specified diseases of pancreas: Secondary | ICD-10-CM | POA: Diagnosis not present

## 2023-11-13 MED ORDER — IOHEXOL 350 MG/ML SOLN
80.0000 mL | Freq: Once | INTRAVENOUS | Status: AC | PRN
  Administered 2023-11-13: 80 mL via INTRAVENOUS

## 2023-11-13 NOTE — Progress Notes (Signed)
Anesthesia Chart Review:  Case: [id number] Date/Time: 11/17/23 0915   Procedures:      WHIPPLE PROCEDURE     LAPAROSCOPY DIAGNOSTIC   Anesthesia type: General   Pre-op diagnosis: PANCREATIC CANCER   Location: [facility] ROOM 02 / [facility]   Surgeons: [name], MD       DISCUSSION: Patient is an 89 year old female scheduled for the above procedure.  History includes never smoker, HTN, HLD, atrial fibrillation (diagnosed 09/2019), GERD, pancreatic cancer (06/2023).  [place] admission 11/04/23 - 11/06/23 for biliary obstruction with transaminitis. GI consulted for ERCP but were unsuccessful with attempts to place biliary stent. IR was consulted and underwent percutaneous transhepatic biliary drain on 11/04/23 with improvement. S/p internalization of her drain across the pancreatic tumor/CBD obstruction by IR on 11/06/23.  She was discharged home with plans for Whipple procedure on 11/17/23 with Dr. [name].  [place] admission 07/07/23 - 07/12/23 and diagnosed with . She presented to the ED with weakness, nausea, diarrhea and exertional dyspnea. HGB 7, K 2.8. CT A/P showed a pancreatic head mass with mass effect on the duodenum. Xarelto held and GI consulted. Transfused PRBC and IV iron for anemia. S/p EGD 07/08/23 showing a large amount of residue in the stomach, normal duodenal bulb with severe extrinsic deformity in the first portion of the duodenum, biopsied and +poorly differentiated adenocarcinoma. S/p duodenal stent on 07/10/23 for gastric outlet obstruction related to pancreatic mass with duodenal invasion. Oncology consulted with plans for out-patient general surgery and oncology follow-up to discuss further management.   Last visit with cardiologist Dr. [name] was on 08/26/2023 for follow-up of atrial fibrillation, diagnosed in 09/2019. Echo at that time showed LVEF 55 to 60%, normal LV/RV function, mildly dilated atria, moderate mitral annular calcification with degenerative mitral valve and mild  to moderate MR, moderate TR, mild to moderate AR, mild PR, 38 mm aortic root, moderately elevated PASP, estimated RVSP 43.4 mmHg.  At her last visit, Dr. [name] noted recent diagnosis of pancreatic cancer. She was undergoing chemotherapy with consideration of surgery afterwards. She had lost some weight but was otherwise tolerating chemo. Xarelto was on hold for now given higher bleeding risk with pancreatic mass with invasion of the duodenum. He wrote, "Potential surgery in future. Cleared for this from a cardiac standpoint." One year follow-up planned.   Anesthesia team to evaluate on the day of surgery.    VS: BP 127/70   Pulse 94   Temp 36.9 C   Resp 16   Ht 5\' 6"  (1.676 m)   Wt 54.1 kg   SpO2 100%   BMI 19.26 kg/m    PROVIDERS: [name], MD is PCP  [name], [name], MD is cardiologist [name], MD is HEM-ONC  LABS: Labs reviewed: Acceptable for surgery. LFTs done on 11/11/23 showed  elevated Alk phos 343, elevated ALT 56, and elevated total bili 1.3, but all were decreased from previous results on 11/16/23.  (all labs ordered are listed, but only abnormal results are displayed)  Labs Reviewed  BASIC METABOLIC PANEL - Abnormal; Notable for the following components:      Result Value   Sodium 131 (*)    Chloride 97 (*)    Glucose, Bld 132 (*)    GFR, Estimated 59 (*)    All other components within normal limits  CBC - Abnormal; Notable for the following components:   RBC 3.57 (*)    Hemoglobin 10.3 (*)    HCT 33.6 (*)  RDW 17.1 (*)    All other components within normal limits  TYPE AND SCREEN     IMAGES: CT Chest/abd/pelvis 10/09/23: IMPRESSION: 1. The pancreatic mass appears reduced in size, currently 3.1 by 1.3 cm, formerly up to 4.0 by 3.2 cm. However, the new duodenal stent in this region may be changing the orientation of the mass. 2. The pancreatic duct caliber is substantially increased from prior, currently up to 1 cm in diameter, previously 0.3  cm. There is also new biliary dilatation. This may well be caused by the pancreatic mass, but the expandable duodenal stent which traverses the region of the ampulla may be contributory as well. 3. A few bilateral lower lobe nodules are present, the largest measuring 6 by 5 mm in the right lower lobe, stable. Surveillance recommended. 4. Hypodense 1.6 cm left thyroid nodule. Recommend thyroid US. 5. Aortic atherosclerosis.    EKG: 07/07/23: Atrial fibrillation at 97 bpm Ventricular premature complex Nonspecific repol abnormality, diffuse leads Confirmed by Alona Bene [phone]) on 07/07/2023 10:33:25 AM   CV: Echo 10/14/19: IMPRESSIONS   1. Left ventricular ejection fraction, by visual estimation, is 55 to  60%. The left ventricle has normal function. There is no left ventricular  hypertrophy.   2. Global right ventricle has normal systolic function.The right  ventricular size is normal. No increase in right ventricular wall  thickness.   3. Left atrial size was mildly dilated.   4. Right atrial size was mildly dilated.   5. Moderate calcification of the mitral valve leaflet(s).   6. Moderate mitral annular calcification.   7. Moderate thickening of the mitral valve leaflet(s).   8. The mitral valve is degenerative. Mild to moderate mitral valve  regurgitation.   9. MR not well interrogated Restricted posterior leaflet motion with  posteriorly directed jet can consider TEE to further evaluate.  10. The tricuspid valve is normal in structure. Tricuspid valve  regurgitation moderate.  11. The aortic valve is tricuspid Aortic valve regurgitation is mild to  moderate by color flow Doppler.  12. There is Moderate calcification of the aortic valve.  13. There is Moderate thickening of the aortic valve.  14. The pulmonic valve was grossly normal. Pulmonic valve regurgitation is  mild by color flow Doppler.  15. Aortic dilatation noted.  16. There is mild dilatation of the aortic root  measuring 38 mm.  17. Moderately elevated pulmonary artery systolic pressure.    Past Medical History:  Diagnosis Date   Arthritis    Dysrhythmia    Family history of melanoma    Family history of pancreatic cancer    Family history of uterine cancer    GERD (gastroesophageal reflux disease)    Hyperlipidemia    Hypertension    pancreatic ca 06/2023   new dx   Vitamin D deficiency     Past Surgical History:  Procedure Laterality Date   BIOPSY  07/08/2023   Procedure: BIOPSY;  Surgeon: Vida Rigger, MD;  Location: [facility];  Service: Gastroenterology;;   CATARACT EXTRACTION Bilateral    DUODENAL STENT PLACEMENT N/A 07/10/2023   Procedure: DUODENAL STENT PLACEMENT;  Surgeon: Vida Rigger, MD;  Location: [facility];  Service: Gastroenterology;  Laterality: N/A;   ERCP Bilateral 11/04/2023   Procedure: ENDOSCOPIC RETROGRADE CHOLANGIOPANCREATOGRAPHY (ERCP);  Surgeon: Vida Rigger, MD;  Location: [facility] ENDOSCOPY;  Service: Gastroenterology;  Laterality: Bilateral;   ESOPHAGOGASTRODUODENOSCOPY (EGD) WITH PROPOFOL N/A 07/08/2023   Procedure: ESOPHAGOGASTRODUODENOSCOPY (EGD) WITH PROPOFOL;  Surgeon: Vida Rigger, MD;  Location:  [facility] ENDOSCOPY;  Service: Gastroenterology;  Laterality: N/A;   ESOPHAGOGASTRODUODENOSCOPY (EGD) WITH PROPOFOL N/A 07/10/2023   Procedure: ESOPHAGOGASTRODUODENOSCOPY (EGD) WITH PROPOFOL;  Surgeon: Vida Rigger, MD;  Location: [facility];  Service: Gastroenterology;  Laterality: N/A;   IR BILIARY DRAIN PLACEMENT WITH CHOLANGIOGRAM  11/04/2023   IR CONVERT BILIARY DRAIN TO INT EXT BILIARY DRAIN  11/06/2023   IR EXCHANGE BILIARY DRAIN  11/12/2023   PORTACATH PLACEMENT N/A 08/04/2023   Procedure: INSERTION PORT-A-CATH;  Surgeon: [name], MD;  Location: [facility] ORS;  Service: General;  Laterality: N/A;    MEDICATIONS:  acetaminophen (TYLENOL) 500 MG tablet   benazepril (LOTENSIN) 20 MG tablet   cyanocobalamin (VITAMIN B12) 1000 MCG tablet   famotidine (PEPCID) 40 MG  tablet   Multiple Vitamin (MULTIVITAMIN WITH MINERALS) TABS tablet   oxyCODONE (OXY IR/ROXICODONE) 5 MG immediate release tablet   simvastatin (ZOCOR) 40 MG tablet   docusate sodium (COLACE) 100 MG capsule   ferrous sulfate 325 (65 FE) MG EC tablet   lidocaine-prilocaine (EMLA) cream   ondansetron (ZOFRAN) 4 MG tablet   polyethylene glycol (MIRALAX / GLYCOLAX) 17 g packet   prochlorperazine (COMPAZINE) 5 MG tablet   No current facility-administered medications for this encounter.    sodium chloride flush (NS) 0.9 % injection 10 mL    Shonna Chock, PA-C Surgical Short Stay/Anesthesiology [facility] Phone [phone] [facility] Phone [phone] 11/13/2023 12:06 PM

## 2023-11-13 NOTE — Anesthesia Preprocedure Evaluation (Addendum)
Anesthesia Evaluation  Patient identified by MRN, date of birth, ID band Patient awake    Reviewed: Allergy & Precautions, NPO status , Patient's Chart, lab work & pertinent test results  History of Anesthesia Complications Negative for: history of anesthetic complications  Airway Mallampati: II  TM Distance: >3 FB Neck ROM: Full    Dental  (+) Teeth Intact, Dental Advisory Given   Pulmonary neg pulmonary ROS, neg shortness of breath, neg sleep apnea, neg COPD, neg recent URI   breath sounds clear to auscultation       Cardiovascular hypertension, (-) angina (-) Past MI + dysrhythmias Atrial Fibrillation  Rhythm:Regular  1. Left ventricular ejection fraction, by visual estimation, is 55 to  60%. The left ventricle has normal function. There is no left ventricular  hypertrophy.   2. Global right ventricle has normal systolic function.The right  ventricular size is normal. No increase in right ventricular wall  thickness.   3. Left atrial size was mildly dilated.   4. Right atrial size was mildly dilated.   5. Moderate calcification of the mitral valve leaflet(s).   6. Moderate mitral annular calcification.   7. Moderate thickening of the mitral valve leaflet(s).   8. The mitral valve is degenerative. Mild to moderate mitral valve  regurgitation.   9. MR not well interrogated Restricted posterior leaflet motion with  posteriorly directed jet can consider TEE to further evaluate.  10. The tricuspid valve is normal in structure. Tricuspid valve  regurgitation moderate.  11. The aortic valve is tricuspid Aortic valve regurgitation is mild to  moderate by color flow Doppler.  12. There is Moderate calcification of the aortic valve.  13. There is Moderate thickening of the aortic valve.  14. The pulmonic valve was grossly normal. Pulmonic valve regurgitation is  mild by color flow Doppler.  15. Aortic dilatation noted.  16. There  is mild dilatation of the aortic root measuring 38 mm.  17. Moderately elevated pulmonary artery systolic pressure.     Neuro/Psych negative neurological ROS  negative psych ROS   GI/Hepatic ,GERD  Medicated and Controlled,,  Endo/Other    Renal/GU      Musculoskeletal  (+) Arthritis ,    Abdominal   Peds  Hematology  (+) Blood dyscrasia, anemia Lab Results      Component                Value               Date                      WBC                      8.4                 11/12/2023                HGB                      10.3 (L)            11/12/2023                HCT                      33.6 (L)            11/12/2023  MCV                      94.1                11/12/2023                PLT                      323                 11/12/2023             Denies blood thinners  Lab Results      Component                Value               Date                      INR                      1.0                 11/04/2023              Anesthesia Other Findings   Reproductive/Obstetrics                              Anesthesia Physical Anesthesia Plan  ASA: 3  Anesthesia Plan: General and Epidural   Post-op Pain Management: Epidural*   Induction: Intravenous  PONV Risk Score and Plan: 4 or greater and Ondansetron, Dexamethasone, Propofol infusion and TIVA  Airway Management Planned: Oral ETT  Additional Equipment: Arterial line  Intra-op Plan:   Post-operative Plan: Extubation in OR  Informed Consent: I have reviewed the patients History and Physical, chart, labs and discussed the procedure including the risks, benefits and alternatives for the proposed anesthesia with the patient or authorized representative who has indicated his/her understanding and acceptance.     Dental advisory given  Plan Discussed with: CRNA  Anesthesia Plan Comments: (PAT note written 11/13/2023 by Shonna Chock, PA-C.  )         Anesthesia Quick Evaluation

## 2023-11-14 ENCOUNTER — Ambulatory Visit: Payer: Self-pay | Admitting: Surgery

## 2023-11-14 DIAGNOSIS — C259 Malignant neoplasm of pancreas, unspecified: Secondary | ICD-10-CM | POA: Diagnosis not present

## 2023-11-14 NOTE — H&P (View-Only) (Signed)
History of Present Illness: Wendy [name] is a 89 y.o. female who is seen today for follow up prior to surgery. She has been feeling well the last few days. Her drain study in IR two days ago showed occlusion of her PTBD, which resolved with flushing. It was left to gravity, with plans to cap in 24-48 hours. She has been walking frequently, and made lasagna for her family last night. She denies fatigue. CT scan yesterday did not show any evidence of disease progression or metastatic disease.         Review of Systems: A complete review of systems was obtained from the patient.  I have reviewed this information and discussed as appropriate with the patient.  See HPI as well for other ROS.       Medical History: Past Medical History Past Medical History: Diagnosis Date  Arthritis    History of cancer    Hypertension         Problem List There is no problem list on file for this patient.     Past Surgical History History reviewed. No pertinent surgical history.     Allergies Allergies Allergen Reactions  Bactrim [Sulfamethoxazole-Trimethoprim] Other (See Comments)     GI UPSET, "WIPED HER OUT", FLU-LIKE SX  Protonix [Pantoprazole] Hives      Medications Ordered Prior to Encounter Current Outpatient Medications on File Prior to Visit Medication Sig Dispense Refill  acetaminophen (TYLENOL) 500 MG tablet Take by mouth      benazepriL (LOTENSIN) 20 MG tablet Take 20 mg by mouth once daily      cyanocobalamin (VITAMIN B12) 1000 MCG tablet Take 1,000 mcg by mouth once daily      docusate (COLACE) 100 MG capsule Take 100 mg by mouth 2 (two) times daily      famotidine (PEPCID) 40 MG tablet Take 40 mg by mouth once daily      ferrous sulfate 325 (65 FE) MG tablet        lidocaine-prilocaine (EMLA) cream Apply topically      multivitamin with minerals tablet        ondansetron (ZOFRAN) 4 MG tablet Take 4 mg by mouth every 6 (six) hours as needed      oxyCODONE (ROXICODONE) 5  MG immediate release tablet Take 5-10 mg by mouth      polyethylene glycol (MIRALAX) powder DISSOLVE 17 GRAMS BY MOUTH AS NEEDED FOR MILD CONSTIPATION      prochlorperazine (COMPAZINE) 5 MG tablet Take by mouth      simvastatin (ZOCOR) 40 MG tablet Take by mouth        No current facility-administered medications on file prior to visit.      Family History Family History Problem Relation Age of Onset  High blood pressure (Hypertension) Mother    Diabetes Father    Skin cancer Sister    High blood pressure (Hypertension) Sister    Diabetes Brother    Skin cancer Brother    Obesity Brother    High blood pressure (Hypertension) Brother        Tobacco Use History Social History    Tobacco Use Smoking Status Never Smokeless Tobacco Never      Social History Social History    Socioeconomic History  Marital status: Married Tobacco Use  Smoking status: Never  Smokeless tobacco: Never Vaping Use  Vaping status: Never Used Substance and Sexual Activity  Alcohol use: Not Currently  Drug use: Never    Social Drivers of Health  Financial Resource Strain: Low Risk  (08/05/2023)   Received from [facility]   Overall Financial Resource Strain (CARDIA)    Difficulty of Paying Living Expenses: Not hard at all Food Insecurity: No Food Insecurity (11/04/2023)   Received from [facility]   Hunger Vital Sign    Worried About Running Out of Food in the Last Year: Never true    Ran Out of Food in the Last Year: Never true Transportation Needs: No Transportation Needs (11/04/2023)   Received from [facility] - Transportation    Lack of Transportation (Medical): No    Lack of Transportation (Non-Medical): No      Objective:     Vitals:   11/14/23 0920 BP: 120/74 Pulse: 102 Temp: 36.4 C (97.6 F) SpO2: 97% Weight: 53.1 kg (117 lb) PainSc: 0-No pain   Body mass index is 18.6 kg/m.   Physical Exam Vitals reviewed.  Constitutional:      General: She  is not in acute distress.    Appearance: Normal appearance.  HENT:     Head: Normocephalic and atraumatic.  Eyes:     General: No scleral icterus.    Conjunctiva/sclera: Conjunctivae normal.  Pulmonary:     Effort: Pulmonary effort is normal. No respiratory distress.  Abdominal:     General: There is no distension.     Palpations: Abdomen is soft.     Tenderness: There is no abdominal tenderness.     Comments: PTBD in place RUQ.  Skin:    General: Skin is warm and dry.     Coloration: Skin is not jaundiced.  Neurological:     General: No focal deficit present.     Mental Status: She is alert and oriented to person, place, and time.            Labs, Imaging and Diagnostic Testing: CT abd/pelvis 11/13/23: IMPRESSION: 1. Stable pancreatic head mass consistent with pancreatic adenocarcinoma. No change in upstream moderate biliary duct dilatation. 2. Interval placement of a percutaneous biliary drain which terminates in the second portion the duodenum. No intrahepatic biliary duct dilatation. 3. Duodenal stent well expanded extending from the duodenal bulb to the distal second portion duodenum. 4. No lymphadenopathy identified. 5. No carcinomatosis.  No liver metastasis.     Assessment and Plan:   Assessment Diagnoses and all orders for this visit:   Pancreatic adenocarcinoma (CMS/HHS-HCC)       This is an 89 yo woman with resectable pancreatic cancer. Today she looks well. She is ambulatory and completing all ADLs independently. I reviewed her newest scan, which does not show evidence of metastatic disease or vascular invasion, and no signs of disease progression. She tolerated chemotherapy and had improvement of her CA19-9 on treatment. We have extensively discussed the details of a Whipple, including the risks, as well as the anticipated postop course and the median time to recurrence of 18-20 months. She is of advanced age but has a good performance status, excellent  family support, and no comorbidities that put her at prohibitively high risk for surgery. [name] has discussed extensively with her family and would like to proceed with surgery. We will proceed as scheduled this coming Monday. If this appears to be unresectable intra-op, I recommended a biliary bypass to provide more durable palliation of her biliary obstruction, as future biliary stent exchanges will be complicated by her duodenal stent. All questions were answered. She was instructed to cap her PTBD (per IR instructions), and has an  extra bag at home. She is to place the drain bag to gravity in the event of any fevers, chills, increased abdominal pain, or leakage.  Sophronia Simas, MD [facility], Hepatobiliary and Pancreatic Surgery 11/14/23 10:00 AM

## 2023-11-14 NOTE — H&P (Signed)
History of Present Illness: Wendy [name] is a 89 y.o. female who is seen today for follow up prior to surgery. She has been feeling well the last few days. Her drain study in IR two days ago showed occlusion of her PTBD, which resolved with flushing. It was left to gravity, with plans to cap in 24-48 hours. She has been walking frequently, and made lasagna for her family last night. She denies fatigue. CT scan yesterday did not show any evidence of disease progression or metastatic disease.         Review of Systems: A complete review of systems was obtained from the patient.  I have reviewed this information and discussed as appropriate with the patient.  See HPI as well for other ROS.       Medical History: Past Medical History Past Medical History: Diagnosis Date  Arthritis    History of cancer    Hypertension         Problem List There is no problem list on file for this patient.     Past Surgical History History reviewed. No pertinent surgical history.     Allergies Allergies Allergen Reactions  Bactrim [Sulfamethoxazole-Trimethoprim] Other (See Comments)     GI UPSET, "WIPED HER OUT", FLU-LIKE SX  Protonix [Pantoprazole] Hives      Medications Ordered Prior to Encounter Current Outpatient Medications on File Prior to Visit Medication Sig Dispense Refill  acetaminophen (TYLENOL) 500 MG tablet Take by mouth      benazepriL (LOTENSIN) 20 MG tablet Take 20 mg by mouth once daily      cyanocobalamin (VITAMIN B12) 1000 MCG tablet Take 1,000 mcg by mouth once daily      docusate (COLACE) 100 MG capsule Take 100 mg by mouth 2 (two) times daily      famotidine (PEPCID) 40 MG tablet Take 40 mg by mouth once daily      ferrous sulfate 325 (65 FE) MG tablet        lidocaine-prilocaine (EMLA) cream Apply topically      multivitamin with minerals tablet        ondansetron (ZOFRAN) 4 MG tablet Take 4 mg by mouth every 6 (six) hours as needed      oxyCODONE (ROXICODONE) 5  MG immediate release tablet Take 5-10 mg by mouth      polyethylene glycol (MIRALAX) powder DISSOLVE 17 GRAMS BY MOUTH AS NEEDED FOR MILD CONSTIPATION      prochlorperazine (COMPAZINE) 5 MG tablet Take by mouth      simvastatin (ZOCOR) 40 MG tablet Take by mouth        No current facility-administered medications on file prior to visit.      Family History Family History Problem Relation Age of Onset  High blood pressure (Hypertension) Mother    Diabetes Father    Skin cancer Sister    High blood pressure (Hypertension) Sister    Diabetes Brother    Skin cancer Brother    Obesity Brother    High blood pressure (Hypertension) Brother        Tobacco Use History Social History    Tobacco Use Smoking Status Never Smokeless Tobacco Never      Social History Social History    Socioeconomic History  Marital status: Married Tobacco Use  Smoking status: Never  Smokeless tobacco: Never Vaping Use  Vaping status: Never Used Substance and Sexual Activity  Alcohol use: Not Currently  Drug use: Never    Social Drivers of Health  Financial Resource Strain: Low Risk  (08/05/2023)   Received from [facility]   Overall Financial Resource Strain (CARDIA)    Difficulty of Paying Living Expenses: Not hard at all Food Insecurity: No Food Insecurity (11/04/2023)   Received from [facility]   Hunger Vital Sign    Worried About Running Out of Food in the Last Year: Never true    Ran Out of Food in the Last Year: Never true Transportation Needs: No Transportation Needs (11/04/2023)   Received from [facility] - Transportation    Lack of Transportation (Medical): No    Lack of Transportation (Non-Medical): No      Objective:     Vitals:   11/14/23 0920 BP: 120/74 Pulse: 102 Temp: 36.4 C (97.6 F) SpO2: 97% Weight: 53.1 kg (117 lb) PainSc: 0-No pain   Body mass index is 18.6 kg/m.   Physical Exam Vitals reviewed.  Constitutional:      General: She  is not in acute distress.    Appearance: Normal appearance.  HENT:     Head: Normocephalic and atraumatic.  Eyes:     General: No scleral icterus.    Conjunctiva/sclera: Conjunctivae normal.  Pulmonary:     Effort: Pulmonary effort is normal. No respiratory distress.  Abdominal:     General: There is no distension.     Palpations: Abdomen is soft.     Tenderness: There is no abdominal tenderness.     Comments: PTBD in place RUQ.  Skin:    General: Skin is warm and dry.     Coloration: Skin is not jaundiced.  Neurological:     General: No focal deficit present.     Mental Status: She is alert and oriented to person, place, and time.            Labs, Imaging and Diagnostic Testing: CT abd/pelvis 11/13/23: IMPRESSION: 1. Stable pancreatic head mass consistent with pancreatic adenocarcinoma. No change in upstream moderate biliary duct dilatation. 2. Interval placement of a percutaneous biliary drain which terminates in the second portion the duodenum. No intrahepatic biliary duct dilatation. 3. Duodenal stent well expanded extending from the duodenal bulb to the distal second portion duodenum. 4. No lymphadenopathy identified. 5. No carcinomatosis.  No liver metastasis.     Assessment and Plan:   Assessment Diagnoses and all orders for this visit:   Pancreatic adenocarcinoma (CMS/HHS-HCC)       This is an 89 yo woman with resectable pancreatic cancer. Today she looks well. She is ambulatory and completing all ADLs independently. I reviewed her newest scan, which does not show evidence of metastatic disease or vascular invasion, and no signs of disease progression. She tolerated chemotherapy and had improvement of her CA19-9 on treatment. We have extensively discussed the details of a Whipple, including the risks, as well as the anticipated postop course and the median time to recurrence of 18-20 months. She is of advanced age but has a good performance status, excellent  family support, and no comorbidities that put her at prohibitively high risk for surgery. Wendy [name] has discussed extensively with her family and would like to proceed with surgery. We will proceed as scheduled this coming Monday. If this appears to be unresectable intra-op, I recommended a biliary bypass to provide more durable palliation of her biliary obstruction, as future biliary stent exchanges will be complicated by her duodenal stent. All questions were answered. She was instructed to cap her PTBD (per IR instructions), and has an  extra bag at home. She is to place the drain bag to gravity in the event of any fevers, chills, increased abdominal pain, or leakage.  Sophronia Simas, MD [facility], Hepatobiliary and Pancreatic Surgery 11/14/23 10:00 AM

## 2023-11-14 NOTE — Progress Notes (Signed)
Pt and daughter made aware of surgery time change for Mon 11/17/23 [id number], arrival 0530, to finish the pre-surgery ensure drink by 0430, and to follow all previous instructions given.

## 2023-11-17 ENCOUNTER — Encounter (HOSPITAL_COMMUNITY)
Admission: RE | Disposition: A | Discharge status: Home / Self Care | Payer: Self-pay | Source: Home / Self Care | Attending: Surgery

## 2023-11-17 ENCOUNTER — Inpatient Hospital Stay (HOSPITAL_COMMUNITY)
Admission: RE | Admit: 2023-11-17 | Discharge: 2023-11-21 | DRG: 409 | Disposition: A | Discharge status: Home / Self Care | Payer: Medicare Other | Attending: Surgery | Admitting: Surgery

## 2023-11-17 ENCOUNTER — Other Ambulatory Visit: Payer: Self-pay

## 2023-11-17 ENCOUNTER — Inpatient Hospital Stay (HOSPITAL_COMMUNITY): Payer: Medicare Other | Admitting: Vascular Surgery

## 2023-11-17 ENCOUNTER — Inpatient Hospital Stay (HOSPITAL_COMMUNITY): Payer: Medicare Other | Admitting: Anesthesiology

## 2023-11-17 ENCOUNTER — Encounter (HOSPITAL_COMMUNITY): Payer: Self-pay | Admitting: Surgery

## 2023-11-17 DIAGNOSIS — Z538 Procedure and treatment not carried out for other reasons: Secondary | ICD-10-CM | POA: Diagnosis not present

## 2023-11-17 DIAGNOSIS — K8010 Calculus of gallbladder with chronic cholecystitis without obstruction: Secondary | ICD-10-CM | POA: Diagnosis present

## 2023-11-17 DIAGNOSIS — Z882 Allergy status to sulfonamides status: Secondary | ICD-10-CM

## 2023-11-17 DIAGNOSIS — C250 Malignant neoplasm of head of pancreas: Secondary | ICD-10-CM

## 2023-11-17 DIAGNOSIS — Z9221 Personal history of antineoplastic chemotherapy: Secondary | ICD-10-CM

## 2023-11-17 DIAGNOSIS — M1990 Unspecified osteoarthritis, unspecified site: Secondary | ICD-10-CM | POA: Diagnosis present

## 2023-11-17 DIAGNOSIS — K311 Adult hypertrophic pyloric stenosis: Secondary | ICD-10-CM | POA: Diagnosis present

## 2023-11-17 DIAGNOSIS — C259 Malignant neoplasm of pancreas, unspecified: Secondary | ICD-10-CM

## 2023-11-17 DIAGNOSIS — G8918 Other acute postprocedural pain: Secondary | ICD-10-CM | POA: Diagnosis not present

## 2023-11-17 DIAGNOSIS — I480 Paroxysmal atrial fibrillation: Secondary | ICD-10-CM | POA: Diagnosis not present

## 2023-11-17 DIAGNOSIS — I10 Essential (primary) hypertension: Secondary | ICD-10-CM | POA: Diagnosis present

## 2023-11-17 DIAGNOSIS — Z808 Family history of malignant neoplasm of other organs or systems: Secondary | ICD-10-CM

## 2023-11-17 DIAGNOSIS — Z888 Allergy status to other drugs, medicaments and biological substances status: Secondary | ICD-10-CM

## 2023-11-17 DIAGNOSIS — Z833 Family history of diabetes mellitus: Secondary | ICD-10-CM

## 2023-11-17 DIAGNOSIS — Z9689 Presence of other specified functional implants: Secondary | ICD-10-CM | POA: Diagnosis present

## 2023-11-17 DIAGNOSIS — Z8249 Family history of ischemic heart disease and other diseases of the circulatory system: Secondary | ICD-10-CM

## 2023-11-17 DIAGNOSIS — E785 Hyperlipidemia, unspecified: Secondary | ICD-10-CM | POA: Diagnosis not present

## 2023-11-17 HISTORY — PX: WHIPPLE PROCEDURE: SHX2667

## 2023-11-17 HISTORY — PX: CHOLECYSTECTOMY: SHX55

## 2023-11-17 HISTORY — PX: GASTRIC ROUX-EN-Y: SHX5262

## 2023-11-17 LAB — PREPARE RBC (CROSSMATCH)

## 2023-11-17 SURGERY — WHIPPLE PROCEDURE
Anesthesia: Epidural

## 2023-11-17 MED ORDER — FAMOTIDINE IN NACL 20-0.9 MG/50ML-% IV SOLN
20.0000 mg | INTRAVENOUS | Status: DC
  Administered 2023-11-17 – 2023-11-18 (×2): 20 mg via INTRAVENOUS
  Filled 2023-11-17 (×3): qty 50

## 2023-11-17 MED ORDER — PHENYLEPHRINE 80 MCG/ML (10ML) SYRINGE FOR IV PUSH (FOR BLOOD PRESSURE SUPPORT)
PREFILLED_SYRINGE | INTRAVENOUS | Status: DC | PRN
  Administered 2023-11-17: 80 ug via INTRAVENOUS

## 2023-11-17 MED ORDER — HEPARIN SODIUM (PORCINE) 5000 UNIT/ML IJ SOLN
5000.0000 [IU] | Freq: Three times a day (TID) | INTRAMUSCULAR | Status: AC
  Administered 2023-11-17 – 2023-11-19 (×7): 5000 [IU] via SUBCUTANEOUS
  Filled 2023-11-17 (×7): qty 1

## 2023-11-17 MED ORDER — HYDROMORPHONE HCL 1 MG/ML IJ SOLN
0.5000 mg | INTRAMUSCULAR | Status: DC | PRN
  Administered 2023-11-17 – 2023-11-18 (×2): 0.5 mg via INTRAVENOUS
  Filled 2023-11-17 (×2): qty 0.5

## 2023-11-17 MED ORDER — SIMVASTATIN 20 MG PO TABS
20.0000 mg | ORAL_TABLET | Freq: Every day | ORAL | Status: DC
  Administered 2023-11-18 – 2023-11-20 (×3): 20 mg via ORAL
  Filled 2023-11-17 (×4): qty 1

## 2023-11-17 MED ORDER — LIDOCAINE 2% (20 MG/ML) 5 ML SYRINGE
INTRAMUSCULAR | Status: AC
  Filled 2023-11-17: qty 5

## 2023-11-17 MED ORDER — VISTASEAL 10 ML SINGLE DOSE KIT
10.0000 mL | PACK | Freq: Once | CUTANEOUS | Status: DC
  Filled 2023-11-17: qty 10

## 2023-11-17 MED ORDER — ENSURE PRE-SURGERY PO LIQD: 296.0000 mL | Freq: Once | ORAL | Status: DC

## 2023-11-17 MED ORDER — PROPOFOL 10 MG/ML IV BOLUS
INTRAVENOUS | Status: DC | PRN
  Administered 2023-11-17: 60 mg via INTRAVENOUS

## 2023-11-17 MED ORDER — LACTATED RINGERS IV SOLN: INTRAVENOUS | Status: DC | PRN

## 2023-11-17 MED ORDER — FENTANYL CITRATE (PF) 250 MCG/5ML IJ SOLN
INTRAMUSCULAR | Status: DC | PRN
  Administered 2023-11-17 (×2): 50 ug via INTRAVENOUS

## 2023-11-17 MED ORDER — DEXAMETHASONE SODIUM PHOSPHATE 10 MG/ML IJ SOLN
INTRAMUSCULAR | Status: DC | PRN
  Administered 2023-11-17: 10 mg via INTRAVENOUS

## 2023-11-17 MED ORDER — FENTANYL CITRATE (PF) 250 MCG/5ML IJ SOLN
INTRAMUSCULAR | Status: AC
  Filled 2023-11-17: qty 5

## 2023-11-17 MED ORDER — ONDANSETRON HCL 4 MG/2ML IJ SOLN
4.0000 mg | Freq: Four times a day (QID) | INTRAMUSCULAR | Status: DC | PRN
  Filled 2023-11-17: qty 2

## 2023-11-17 MED ORDER — ENSURE PRE-SURGERY PO LIQD: 592.0000 mL | Freq: Once | ORAL | Status: DC

## 2023-11-17 MED ORDER — PROPOFOL 500 MG/50ML IV EMUL
INTRAVENOUS | Status: DC | PRN
  Administered 2023-11-17: 100 ug/kg/min via INTRAVENOUS

## 2023-11-17 MED ORDER — ORAL CARE MOUTH RINSE: 15.0000 mL | Freq: Once | OROMUCOSAL | Status: AC

## 2023-11-17 MED ORDER — SUGAMMADEX SODIUM 200 MG/2ML IV SOLN
INTRAVENOUS | Status: DC | PRN
  Administered 2023-11-17: 200 mg via INTRAVENOUS

## 2023-11-17 MED ORDER — CHLORHEXIDINE GLUCONATE 0.12 % MT SOLN
15.0000 mL | Freq: Once | OROMUCOSAL | Status: AC
  Administered 2023-11-17: 15 mL via OROMUCOSAL
  Filled 2023-11-17: qty 15

## 2023-11-17 MED ORDER — LIDOCAINE-EPINEPHRINE (PF) 1.5 %-1:200000 IJ SOLN
INTRAMUSCULAR | Status: DC | PRN
  Administered 2023-11-17: 5 mL via EPIDURAL

## 2023-11-17 MED ORDER — ACETAMINOPHEN 10 MG/ML IV SOLN
1000.0000 mg | Freq: Three times a day (TID) | INTRAVENOUS | Status: AC
  Administered 2023-11-17 – 2023-11-18 (×3): 1000 mg via INTRAVENOUS
  Filled 2023-11-17 (×3): qty 100

## 2023-11-17 MED ORDER — DIPHENHYDRAMINE HCL 12.5 MG/5ML PO ELIX: 12.5000 mg | ORAL_SOLUTION | Freq: Four times a day (QID) | ORAL | Status: DC | PRN

## 2023-11-17 MED ORDER — ONDANSETRON HCL 4 MG/2ML IJ SOLN
INTRAMUSCULAR | Status: DC | PRN
  Administered 2023-11-17: 4 mg via INTRAVENOUS

## 2023-11-17 MED ORDER — ONDANSETRON HCL 4 MG/2ML IJ SOLN
INTRAMUSCULAR | Status: AC
  Filled 2023-11-17: qty 2

## 2023-11-17 MED ORDER — SODIUM CHLORIDE 0.9% FLUSH
10.0000 mL | Freq: Two times a day (BID) | INTRAVENOUS | Status: DC
  Administered 2023-11-17 – 2023-11-21 (×9): 10 mL via INTRAVENOUS

## 2023-11-17 MED ORDER — DEXTROSE-SODIUM CHLORIDE 5-0.45 % IV SOLN: INTRAVENOUS | Status: DC

## 2023-11-17 MED ORDER — ONDANSETRON 4 MG PO TBDP: 4.0000 mg | ORAL_TABLET | Freq: Four times a day (QID) | ORAL | Status: DC | PRN

## 2023-11-17 MED ORDER — DIPHENHYDRAMINE HCL 50 MG/ML IJ SOLN: 12.5000 mg | Freq: Four times a day (QID) | INTRAMUSCULAR | Status: DC | PRN

## 2023-11-17 MED ORDER — LIDOCAINE 2% (20 MG/ML) 5 ML SYRINGE
INTRAMUSCULAR | Status: DC | PRN
  Administered 2023-11-17: 40 mg via INTRAVENOUS

## 2023-11-17 MED ORDER — PROPOFOL 10 MG/ML IV BOLUS
INTRAVENOUS | Status: AC
Start: 2023-11-17 — End: ?
  Filled 2023-11-17: qty 20

## 2023-11-17 MED ORDER — DEXAMETHASONE SODIUM PHOSPHATE 10 MG/ML IJ SOLN
INTRAMUSCULAR | Status: AC
  Filled 2023-11-17: qty 1

## 2023-11-17 MED ORDER — PHENYLEPHRINE HCL-NACL 20-0.9 MG/250ML-% IV SOLN
INTRAVENOUS | Status: DC | PRN
  Administered 2023-11-17: 30 ug/min via INTRAVENOUS

## 2023-11-17 MED ORDER — METHOCARBAMOL 1000 MG/10ML IJ SOLN
500.0000 mg | Freq: Three times a day (TID) | INTRAMUSCULAR | Status: DC
  Administered 2023-11-17 – 2023-11-19 (×7): 500 mg via INTRAVENOUS
  Filled 2023-11-17 (×7): qty 10

## 2023-11-17 MED ORDER — ROPIVACAINE HCL 2 MG/ML IJ SOLN
8.0000 mL/h | INTRAMUSCULAR | Status: DC
  Administered 2023-11-17: 6 mL/h via EPIDURAL
  Filled 2023-11-17: qty 100
  Filled 2023-11-17: qty 200
  Filled 2023-11-17: qty 100
  Filled 2023-11-17 (×3): qty 200

## 2023-11-17 MED ORDER — ROCURONIUM BROMIDE 10 MG/ML (PF) SYRINGE
PREFILLED_SYRINGE | INTRAVENOUS | Status: DC | PRN
  Administered 2023-11-17: 20 mg via INTRAVENOUS
  Administered 2023-11-17: 50 mg via INTRAVENOUS
  Administered 2023-11-17: 20 mg via INTRAVENOUS
  Administered 2023-11-17: 30 mg via INTRAVENOUS

## 2023-11-17 MED ORDER — ROCURONIUM BROMIDE 10 MG/ML (PF) SYRINGE
PREFILLED_SYRINGE | INTRAVENOUS | Status: AC
  Filled 2023-11-17: qty 10

## 2023-11-17 MED ORDER — PIPERACILLIN-TAZOBACTAM 3.375 G IVPB
3.3750 g | INTRAVENOUS | Status: AC
  Administered 2023-11-17: 3.375 g via INTRAVENOUS
  Filled 2023-11-17: qty 50

## 2023-11-17 SURGICAL SUPPLY — 112 items
5 PAIRS OF YELLOW SUTURE CLAMP (MISCELLANEOUS) ×4
BAG BILE T-TUBES STRL (MISCELLANEOUS) IMPLANT
BAG COUNTER SPONGE SURGICOUNT (BAG) ×2 IMPLANT
BAG DRAINAGE 600ML DEPOT (BAG) IMPLANT
BIOPATCH RED 1 DISK 7.0 (GAUZE/BANDAGES/DRESSINGS) IMPLANT
BLADE CLIPPER SURG (BLADE) IMPLANT
BLADE SURG 10 STRL SS (BLADE) ×2 IMPLANT
CANISTER SUCT 3000ML PPV (MISCELLANEOUS) ×2 IMPLANT
CHLORAPREP W/TINT 26 (MISCELLANEOUS) ×2 IMPLANT
CLAMP SUTURE YELLOW 5 PAIRS (MISCELLANEOUS) ×4 IMPLANT
CLIP TI LARGE 6 (CLIP) ×2 IMPLANT
CLIP TI MEDIUM 24 (CLIP) ×2 IMPLANT
CLIP TI WIDE RED SMALL 24 (CLIP) ×2 IMPLANT
CNTNR URN SCR LID CUP LEK RST (MISCELLANEOUS) IMPLANT
CONT SPEC 4OZ STRL OR WHT (MISCELLANEOUS)
COUNTER NDL 20CT MAGNET RED (NEEDLE) IMPLANT
COVER MAYO STAND STRL (DRAPES) IMPLANT
COVER PROBE W GEL 5X96 (DRAPES) IMPLANT
COVER SURGICAL LIGHT HANDLE (MISCELLANEOUS) ×2 IMPLANT
DERMABOND ADVANCED .7 DNX12 (GAUZE/BANDAGES/DRESSINGS) ×4 IMPLANT
DISSECTOR SURG LIGASURE 21 (MISCELLANEOUS) ×2 IMPLANT
DRAIN CHANNEL 19F RND (DRAIN) IMPLANT
DRAIN PENROSE 0.5X18 (DRAIN) ×2 IMPLANT
DRAPE INCISE IOBAN 66X45 STRL (DRAPES) ×2 IMPLANT
DRAPE LAPAROSCOPIC ABDOMINAL (DRAPES) ×2 IMPLANT
DRAPE WARM FLUID 44X44 (DRAPES) ×2 IMPLANT
DRSG COVADERM PLUS 2X2 (GAUZE/BANDAGES/DRESSINGS) IMPLANT
DRSG TEGADERM 4X4.75 (GAUZE/BANDAGES/DRESSINGS) IMPLANT
DRSG TELFA 3X8 NADH STRL (GAUZE/BANDAGES/DRESSINGS) ×2 IMPLANT
ELECT BLADE 4.0 EZ CLEAN MEGAD (MISCELLANEOUS) ×2 IMPLANT
ELECT BLADE 6.5 EXT (BLADE) ×2 IMPLANT
ELECT CAUTERY BLADE 6.4 (BLADE) ×2 IMPLANT
ELECT NDL BLADE 2-5/6 (NEEDLE) IMPLANT
ELECT NEEDLE BLADE 2-5/6 (NEEDLE) IMPLANT
ELECT PAD DSPR THERM+ ADLT (MISCELLANEOUS) ×2 IMPLANT
ELECT REM PT RETURN 9FT ADLT (ELECTROSURGICAL) ×2 IMPLANT
ELECTRODE BLDE 4.0 EZ CLN MEGD (MISCELLANEOUS) ×2 IMPLANT
ELECTRODE REM PT RTRN 9FT ADLT (ELECTROSURGICAL) ×2 IMPLANT
EVACUATOR SILICONE 100CC (DRAIN) IMPLANT
GAUZE 4X4 16PLY ~~LOC~~+RFID DBL (SPONGE) IMPLANT
GAUZE SPONGE 2X2 STRL 8-PLY (GAUZE/BANDAGES/DRESSINGS) ×4 IMPLANT
GAUZE SPONGE 4X4 12PLY STRL (GAUZE/BANDAGES/DRESSINGS) IMPLANT
GEL ULTRASOUND 20GR AQUASONIC (MISCELLANEOUS) IMPLANT
GLOVE BIOGEL PI IND STRL 6 (GLOVE) ×2 IMPLANT
GLOVE BIOGEL PI MICRO STRL 5.5 (GLOVE) ×4 IMPLANT
GOWN STRL REUS W/ TWL LRG LVL3 (GOWN DISPOSABLE) ×6 IMPLANT
GOWN STRL REUS W/TWL LRG LVL3 (GOWN DISPOSABLE) ×6
HAND PENCIL TRP OPTION (MISCELLANEOUS) ×2 IMPLANT
HANDLE SUCTION POOLE (INSTRUMENTS) ×2 IMPLANT
HEMOSTAT SNOW SURGICEL 2X4 (HEMOSTASIS) ×2 IMPLANT
HEMOSTAT SURGICEL 2X14 (HEMOSTASIS) IMPLANT
J-TUBE MIC 16FX51 UNV ENFIT (TUBING) IMPLANT
KIT BASIN OR (CUSTOM PROCEDURE TRAY) ×2 IMPLANT
KIT MARKER MARGIN INK (KITS) ×2 IMPLANT
KIT TURNOVER KIT B (KITS) ×2 IMPLANT
L-HOOK LAP DISP 36CM (ELECTROSURGICAL) IMPLANT
LHOOK LAP DISP 36CM (ELECTROSURGICAL) IMPLANT
LOOP VASCLR MAXI BLUE 18IN ST (MISCELLANEOUS) ×2 IMPLANT
MARKER SKIN DUAL TIP RULER LAB (MISCELLANEOUS) ×2 IMPLANT
NS IRRIG 1000ML POUR BTL (IV SOLUTION) ×4 IMPLANT
PACK GENERAL/GYN (CUSTOM PROCEDURE TRAY) IMPLANT
PAD ARMBOARD 7.5X6 YLW CONV (MISCELLANEOUS) ×4 IMPLANT
PENCIL SMOKE EVACUATOR (MISCELLANEOUS) ×2 IMPLANT
RELOAD PROXIMATE 75MM BLUE (ENDOMECHANICALS) ×2 IMPLANT
RELOAD PROXIMATE 75MM GREEN (ENDOMECHANICALS) IMPLANT
RELOAD STAPLE 75 3.8 BLU REG (ENDOMECHANICALS) ×2 IMPLANT
RELOAD STAPLE 75 4.5 GRN THCK (ENDOMECHANICALS) IMPLANT
RETRACTOR WND ALEXIS 25 LRG (MISCELLANEOUS) IMPLANT
RETRACTOR WOUND ALXS 34CM XLRG (MISCELLANEOUS) IMPLANT
RTRCTR WOUND ALEXIS 25CM LRG (MISCELLANEOUS) IMPLANT
RTRCTR WOUND ALEXIS 34CM XLRG (MISCELLANEOUS) ×2 IMPLANT
SPONGE INTESTINAL PEANUT (DISPOSABLE) IMPLANT
SPONGE T-LAP 18X18 ~~LOC~~+RFID (SPONGE) ×4 IMPLANT
STAPLER GUN LINEAR PROX 60 (STAPLE) IMPLANT
STAPLER PROXIMATE 75MM BLUE (STAPLE) ×2 IMPLANT
STAPLER VISISTAT 35W (STAPLE) IMPLANT
SUCTION POOLE HANDLE (INSTRUMENTS) ×2 IMPLANT
SUT ETHILON 2 0 FS 18 (SUTURE) IMPLANT
SUT ETHILON 2 LR (SUTURE) IMPLANT
SUT MNCRL AB 4-0 PS2 18 (SUTURE) ×2 IMPLANT
SUT PDS AB 1 TP1 96 (SUTURE) ×4 IMPLANT
SUT PDS AB 3-0 SH 27 (SUTURE) ×4 IMPLANT
SUT PDS AB 4-0 RB1 27 (SUTURE) ×12 IMPLANT
SUT PDS II 5-0 RB-2 VIOLET (SUTURE) ×12 IMPLANT
SUT PROLENE 3 0 SH 48 (SUTURE) ×2 IMPLANT
SUT PROLENE 4 0 RB 1 (SUTURE) ×4
SUT PROLENE 4-0 RB1 .5 CRCL 36 (SUTURE) ×4 IMPLANT
SUT SILK 2 0 TIES 10X30 (SUTURE) ×4 IMPLANT
SUT SILK 2 0SH CR/8 30 (SUTURE) ×2 IMPLANT
SUT SILK 3 0 TIES 10X30 (SUTURE) ×2 IMPLANT
SUT SILK 3 0SH CR/8 30 (SUTURE) ×4 IMPLANT
SUT VIC AB 2-0 CT1 27 (SUTURE)
SUT VIC AB 2-0 CT1 TAPERPNT 27 (SUTURE) IMPLANT
SUT VIC AB 2-0 SH 18 (SUTURE) IMPLANT
SUT VIC AB 3-0 MH 27 (SUTURE) IMPLANT
SUT VIC AB 3-0 SH 18 (SUTURE) IMPLANT
SUT VIC AB 3-0 SH 27 (SUTURE) ×6
SUT VIC AB 3-0 SH 27X BRD (SUTURE) ×4 IMPLANT
SUT VIC AB 3-0 SH 8-18 (SUTURE) ×2 IMPLANT
SUT VICRYL AB 2 0 TIES (SUTURE) IMPLANT
SYR BULB IRRIG 60ML STRL (SYRINGE) IMPLANT
TAG SUTURE CLAMP YLW 5PR (MISCELLANEOUS) ×4 IMPLANT
TAPE UMBILICAL 1/8 X36 TWILL (MISCELLANEOUS) IMPLANT
TIE VASCULAR MAXI BLUE 18IN ST (MISCELLANEOUS) ×2 IMPLANT
TOWEL GREEN STERILE (TOWEL DISPOSABLE) ×2 IMPLANT
TOWEL GREEN STERILE FF (TOWEL DISPOSABLE) ×2 IMPLANT
TRAY FOLEY MTR SLVR 14FR STAT (SET/KITS/TRAYS/PACK) ×2 IMPLANT
TUBE JEJUNAL 16FR ENFIT (TUBING) IMPLANT
TUBE NG 5FR 35IN ENFIT (TUBING) IMPLANT
TUBE PU 8FR 16IN ENFIT (TUBING) IMPLANT
VASCULAR TIE MAXI BLUE 18IN ST (MISCELLANEOUS) ×2
VASCULAR TIE MINI RED 18IN STL (MISCELLANEOUS) ×2 IMPLANT

## 2023-11-17 NOTE — Anesthesia Procedure Notes (Addendum)
Epidural Patient location during procedure: pre-op Start time: 11/17/2023 7:18 AM End time: 11/17/2023 7:36 AM  Staffing Anesthesiologist: Val Eagle, MD Performed: anesthesiologist   Preanesthetic Checklist Completed: patient identified, IV checked, risks and benefits discussed, surgical consent, monitors and equipment checked, pre-op evaluation and timeout performed  Epidural Patient position: sitting Prep: DuraPrep Patient monitoring: heart rate, continuous pulse ox and blood pressure Approach: midline Location: thoracic (1-12) Injection technique: LOR saline  Needle:  Needle type: Tuohy  Needle gauge: 17 G Needle length: 9 cm Needle insertion depth: 4 cm Catheter type: closed end flexible Catheter size: 19 Gauge Catheter at skin depth: 13 cm Test dose: negative and 1.5% lidocaine with Epi 1:200 K  Assessment Events: blood not aspirated, no cerebrospinal fluid, injection not painful, no injection resistance, no paresthesia and negative IV test  Additional Notes Reason for block:at surgeon's request and post-op pain management

## 2023-11-17 NOTE — Transfer of Care (Signed)
Immediate Anesthesia Transfer of Care Note  Patient: Wendy [name]  Procedure(s) Performed: EXPLORATORY LAPAROTOMY OPEN CHOLECYSTECTOMY OPEN ROUX-EN-Y HEPATIC-JEJUNOSTOMY  Patient Location: PACU  Anesthesia Type:General and Epidural  Level of Consciousness: drowsy  Airway & Oxygen Therapy: Patient Spontanous Breathing and Patient connected to face mask oxygen  Post-op Assessment: Report given to RN, Post -op Vital signs reviewed and stable, and Patient moving all extremities  Post vital signs: Reviewed and stable  Last Vitals:  Vitals Value Taken Time  BP    Temp    Pulse 54 11/17/23 1034  Resp 15 11/17/23 1034  SpO2 97 % 11/17/23 1034  Vitals shown include unfiled device data.  Last Pain:  Vitals:   11/17/23 0611  TempSrc: Oral  PainSc:       Patients Stated Pain Goal: 0 (11/17/23 4259)  Complications: No notable events documented.

## 2023-11-17 NOTE — Op Note (Signed)
Date: 11/17/23  Patient: Wendy [name] MRN: [id number]  Preoperative Diagnosis: Pancreatic adenocarcinoma Postoperative Diagnosis: Same  Procedure:  Exploratory laparotomy Cholecystectomy Roux-en-Y choledochojejunostomy  Surgeon: Sophronia Simas, MD Assistant: Axel Filler, MD  EBL: 50 mL  Anesthesia: General endotracheal  Specimens: Gallbladder  Indications: Wendy [name] is an 89 yo female who presented several months ago with gastric outlet obstruction secondary to large mass in the head of the pancreas.  Obstruction was successfully relieved with an endoluminal duodenal stent and she completed 6 cycles of neoadjuvant chemotherapy.  She developed jaundice towards the end of her treatment, and underwent placement of percutaneous transhepatic biliary stent.  Imaging showed no evidence of disease progression and she tolerated systemic treatment.  After an extensive discussion of the risks and benefits of surgery, she agreed to proceed with surgical exploration and a Whipple.  Findings: No evidence of metastatic disease within the abdomen.  Bulky tumor in the head of the pancreas with involvement of the transverse mesocolon and likely lateral aspect of the distal SMV.  Given the patient's advanced age, resection was aborted.  Cholecystectomy and a palliative biliary bypass over the existing transhepatic biliary drain were performed.  Procedure details: Informed consent was obtained in the preoperative area prior to the procedure. The patient was brought to the operating room and placed on the table in the supine position. General anesthesia was induced and appropriate lines and drains were placed for intraoperative monitoring. Perioperative antibiotics were administered per SCIP guidelines. The abdomen was prepped and draped in the usual sterile fashion. A pre-procedure timeout was taken verifying patient identity, surgical site and procedure to be performed.  An upper midline skin  incision was made and extended just inferior to the umbilicus.  The subcutaneous tissue was divided with cautery to expose the fascia.  The fascia was elevated and opened along the linea alba.  The falciform ligament was taken down off the abdominal wall and ligated with LigaSure.  An Horticulturist, commercial and Bookwalter fixed retractor were placed.  The liver was inspected and both lobes were palpated, and there were no nodules or evidence of metastatic disease.  The peritoneal surface was smooth with with no evidence of carcinomatosis.  The duodenal stent was palpable and there was a bulky firm mass within the adjacent head of the pancreas.  The duodenum was widely kocherized to expose the IVC.  The transverse colon was elevated and it was apparent that the root of the transverse mesocolon was being pulled into the tumor in the head of the pancreas.  The middle colic vein was identified and followed down to the neck of the pancreas.  The surface of the SMV was exposed using gentle blunt dissection, and a tunnel was bluntly created between the posterior pancreas and the SMV.  There did not appear to be any tumor invasion into the anterior surface of the SMV.  However the tumor appeared to abut the more distal SMV with involvement of the gastroepiploic vein at the insertion into the SMV.  The gastroepiploic vein could not be circumferentially dissected out.  The transverse colon was being pulled into the tumor, with apparent involvement of the middle colic artery.  The tumor extended very close to the wall of the colon.  It was clear that successful resection of the tumor with a negative margin would likely require a colonic resection, and possible repair of the SMV.  I felt this would put the patient at significant risk of major blood loss and anastomotic  leak, and given her advanced age I felt that the risks of resection outweighed the potential benefits.  My partner agreed, and we elected to abort the  resection.  A cholecystectomy was performed by taking the gallbladder off the cystic plate in a dome-down fashion using cautery.  The cystic artery was circumferentially dissected out and ligated with LigaSure.  The cystic duct was dissected out, clamped, and ligated with a 2-0 silk tie.  The gallbladder was passed off the field and sent for routine pathology.  Next the transhepatic biliary drain was palpated within the common bile duct.  The common bile duct was circumferentially dissected out using blunt dissection, and divided with cautery just distal to the cystic duct insertion.  The end of the biliary drain was removed from the distal end of the bile duct and the pigtail end was cut and removed, to leave a straight drainage tip.  The distal end of the common bile duct was oversewn with a running 3-0 PDS suture.  A mesenteric window was then created in the jejunum approximately 20 cm distal to the ligament of Treitz.  The jejunum was transected at this point using a 75 mm GIA stapler with a blue load.  An approximately 60 cm Roux limb was then created, and this was passed through the defect in the transverse mesocolon to the right of the middle colic vessels up into the right upper quadrant.  A side-to-side jejunojejunostomy was created with a 75 mm GIA stapler with a blue load.  The common enterotomy was closed with a TA 60 blue stapler.  An enterotomy was then created on the end of the Roux limb on the antimesenteric border.  An end-to-side choledochojejunostomy was created using interrupted 4-0 PDS sutures.  After the back row was completed, the end of the transhepatic biliary drain was passed across the anastomosis into the jejunum.  The anterior row of sutures was then placed to complete the anastomosis over the drain.  The Roux limb was tacked to the transverse colon mesentery using 3-0 silk figure-of-eight sutures.  The mesenteric defect at the JJ anastomosis was closed similarly with 3-0 silk  figure-of-eight sutures.  There was a small serosal injury on the transverse mesocolon which was oversewn with 3-0 silk Lembert sutures.  The colon was otherwise well-perfused with no signs of injury.  The duodenal stent appeared widely patent and the stomach was decompressed.  The duodenal stent was functioning well, thus I did not feel that a gastrojejunal bypass was necessary.  The abdomen was irrigated with warm saline and appeared hemostatic.  A 19 [place] JP drain was placed posterior to the biliary anastomosis, brought out through the right mid-abdominal wall, and secured to the skin with 2-0 nylon suture.  The retractors and wound protector were removed.  The fascia was closed with a running looped 1 PDS suture.  Scarpa's layer was closed with a running 3-0 Vicryl suture and the skin was closed with running subcuticular 4-0 Monocryl suture.  Dermabond was applied.  The biliary drain was attached to a gravity drainage bag and the JP drain was attached to suction bulb.  The patient tolerated the procedure well with no apparent complications.  All counts were correct x2 at the end of the procedure. The patient was extubated and taken to PACU in stable condition.  Sophronia Simas, MD 11/17/23 10:38 AM

## 2023-11-17 NOTE — Plan of Care (Signed)
  Problem: Education: Goal: Knowledge of General Education information will improve Description: Including pain rating scale, medication(s)/side effects and non-pharmacologic comfort measures 11/17/2023 1945 by Lenard Forth, RN Outcome: Progressing 11/17/2023 1849 by Lenard Forth, RN Outcome: Progressing   Problem: Clinical Measurements: Goal: Will remain free from infection Outcome: Progressing Goal: Respiratory complications will improve Outcome: Progressing   Problem: Coping: Goal: Level of anxiety will decrease 11/17/2023 1945 by Lenard Forth, RN Outcome: Progressing 11/17/2023 1849 by Lenard Forth, RN Outcome: Progressing   Problem: Pain Management: Goal: General experience of comfort will improve 11/17/2023 1945 by Lenard Forth, RN Outcome: Progressing 11/17/2023 1849 by Lenard Forth, RN Outcome: Progressing   Problem: Safety: Goal: Ability to remain free from injury will improve 11/17/2023 1945 by Lenard Forth, RN Outcome: Progressing 11/17/2023 1849 by Lenard Forth, RN Outcome: Progressing   Problem: Skin Integrity: Goal: Demonstration of wound healing without infection will improve Outcome: Progressing

## 2023-11-17 NOTE — Plan of Care (Signed)
  Problem: Education: Goal: Knowledge of General Education information will improve Description: Including pain rating scale, medication(s)/side effects and non-pharmacologic comfort measures Outcome: Progressing   Problem: Clinical Measurements: Goal: Will remain free from infection Outcome: Progressing Goal: Respiratory complications will improve Outcome: Progressing   Problem: Coping: Goal: Level of anxiety will decrease Outcome: Progressing   Problem: Pain Management: Goal: General experience of comfort will improve Outcome: Progressing   Problem: Safety: Goal: Ability to remain free from injury will improve Outcome: Progressing

## 2023-11-17 NOTE — Anesthesia Procedure Notes (Signed)
Procedure Name: Intubation Date/Time: 11/17/2023 8:00 AM  Performed by: Allyn Kenner, CRNAPre-anesthesia Checklist: Patient identified, Emergency Drugs available, Suction available and Patient being monitored Patient Re-evaluated:Patient Re-evaluated prior to induction Oxygen Delivery Method: Circle System Utilized Preoxygenation: Pre-oxygenation with 100% oxygen Induction Type: IV induction Ventilation: Mask ventilation without difficulty Laryngoscope Size: Mac and 3 Grade View: Grade I Tube type: Oral Tube size: 7.0 mm Number of attempts: 1 Airway Equipment and Method: Stylet Placement Confirmation: ETT inserted through vocal cords under direct vision, positive ETCO2 and breath sounds checked- equal and bilateral Secured at: 23 cm Tube secured with: Tape Dental Injury: Teeth and Oropharynx as per pre-operative assessment

## 2023-11-17 NOTE — Anesthesia Procedure Notes (Addendum)
Arterial Line Insertion Start/End11/18/2024 6:45 AM, 11/17/2023 6:50 AM Performed by: Dawayne Cirri, RN, CRNA  Patient location: Pre-op. Preanesthetic checklist: patient identified, IV checked, site marked, risks and benefits discussed, surgical consent, monitors and equipment checked, pre-op evaluation, timeout performed and anesthesia consent Lidocaine 1% used for infiltration Right, radial was placed Catheter size: 20 G Hand hygiene performed  and maximum sterile barriers used   Attempts: 1 Procedure performed without using ultrasound guided technique. Following insertion, dressing applied. Post procedure assessment: normal and unchanged  Patient tolerated the procedure well with no immediate complications.

## 2023-11-17 NOTE — Interval H&P Note (Signed)
History and Physical Interval Note:  11/17/2023 7:10 AM  Wendy [name]  has presented today for surgery, with the diagnosis of PANCREATIC CANCER.  The various methods of treatment have been discussed with the patient and family. After consideration of risks, benefits and other options for treatment, the patient has consented to  Procedure(s): WHIPPLE PROCEDURE (N/A) as a surgical intervention.  The patient's history has been reviewed, patient examined, no change in status, stable for surgery.  I have reviewed the patient's chart and labs.  Questions were answered to the patient's satisfaction.  Admit to ICU postoperatively.   [name]

## 2023-11-17 NOTE — Anesthesia Procedure Notes (Addendum)
Arterial Line Insertion Start/End11/18/2024 8:05 AM, 11/17/2023 8:07 AM Performed by: Dawayne Cirri, RN, CRNA  Patient location: OR. Preanesthetic checklist: patient identified, IV checked, site marked, risks and benefits discussed, surgical consent, monitors and equipment checked, pre-op evaluation, timeout performed and anesthesia consent Patient sedated Left, radial was placed Catheter size: 20 G Hand hygiene performed  and maximum sterile barriers used   Attempts: 1 Procedure performed without using ultrasound guided technique. Following insertion, dressing applied. Post procedure assessment: normal and unchanged  Patient tolerated the procedure well with no immediate complications.

## 2023-11-17 NOTE — Progress Notes (Signed)
   11/17/23 1219  Vitals  Temp 97.8 F (36.6 C)  Temp Source Axillary  BP (!) 108/47  MAP (mmHg) 65  BP Location Right Arm  BP Method Automatic  Patient Position (if appropriate) Lying  Pulse Rate 67  Pulse Rate Source Monitor  ECG Heart Rate 63  Resp 15  Level of Consciousness  Level of Consciousness Alert  MEWS COLOR  MEWS Score Color Green  Oxygen Therapy  SpO2 99 %  O2 Device Nasal Cannula  O2 Flow Rate (L/min) 2 L/min  Pain Assessment  Pain Scale 0-10  Pain Score 10  Pain Type Acute pain  Pain Location Abdomen  Pain Orientation Mid  Pain Descriptors / Indicators Aching  Pain Frequency Constant  Pain Onset On-going  Patients Stated Pain Goal 0  Pain Intervention(s) Medication (See eMAR);Emotional support  Complaints & Interventions  Neuro symptoms relieved by Rest  PCA/Epidural/Spinal Assessment  Respiratory Pattern Regular;Unlabored;Symmetrical  ECG Monitoring  Cardiac Rhythm Atrial fibrillation  Glasgow Coma Scale  Eye Opening 4  Best Verbal Response (NON-intubated) 5  Best Motor Response 6  Glasgow Coma Scale Score 15  MEWS Score  MEWS Temp 0  MEWS Systolic 0  MEWS Pulse 0  MEWS RR 0  MEWS LOC 0  MEWS Score 0   Pt arrived to unit from PACU. Pt A&Ox4 but drowsy. Pt able to follow commands. VSS. MAEx4. Abdominal incision clean, dry, intact with skin glue. Epidural dressing clean, dry, and intact. A-line dressing intact. JP drain in RUQ dressing saturated and changed. Biliary drain unremarkable. Pt stated 10/10 pain to abdomen, PRN medication given. Foley intact and unclamped. No pressure ulcers noted. No personal belongings with pt on arrival. Pt oriented to unit and call light. Bed alarm on, bed in lowest position. Family at bedside.

## 2023-11-18 ENCOUNTER — Encounter (HOSPITAL_COMMUNITY): Payer: Self-pay | Admitting: Surgery

## 2023-11-18 LAB — CBC
HCT: 30.4 % — ABNORMAL LOW (ref 36.0–46.0)
Hemoglobin: 9.8 g/dL — ABNORMAL LOW (ref 12.0–15.0)
MCH: 28.9 pg (ref 26.0–34.0)
MCHC: 32.2 g/dL (ref 30.0–36.0)
MCV: 89.7 fL (ref 80.0–100.0)
Platelets: 299 10*3/uL (ref 150–400)
RBC: 3.39 MIL/uL — ABNORMAL LOW (ref 3.87–5.11)
RDW: 16.7 % — ABNORMAL HIGH (ref 11.5–15.5)
WBC: 12.5 10*3/uL — ABNORMAL HIGH (ref 4.0–10.5)
nRBC: 0 % (ref 0.0–0.2)

## 2023-11-18 LAB — COMPREHENSIVE METABOLIC PANEL
ALT: 36 U/L (ref 0–44)
AST: 41 U/L (ref 15–41)
Albumin: 2.4 g/dL — ABNORMAL LOW (ref 3.5–5.0)
Alkaline Phosphatase: 167 U/L — ABNORMAL HIGH (ref 38–126)
Anion gap: 8 (ref 5–15)
BUN: 19 mg/dL (ref 8–23)
CO2: 24 mmol/L (ref 22–32)
Calcium: 9.1 mg/dL (ref 8.9–10.3)
Chloride: 101 mmol/L (ref 98–111)
Creatinine, Ser: 0.64 mg/dL (ref 0.44–1.00)
GFR, Estimated: >60 mL/min (ref >60)
Glucose, Bld: 155 mg/dL — ABNORMAL HIGH (ref 70–99)
Potassium: 4 mmol/L (ref 3.5–5.1)
Sodium: 133 mmol/L — ABNORMAL LOW (ref 135–145)
Total Bilirubin: 0.9 mg/dL (ref <1.2)
Total Protein: 5.1 g/dL — ABNORMAL LOW (ref 6.5–8.1)

## 2023-11-18 LAB — SURGICAL PATHOLOGY

## 2023-11-18 MED ORDER — OXYCODONE HCL 5 MG PO TABS
5.0000 mg | ORAL_TABLET | ORAL | Status: DC | PRN
  Administered 2023-11-18 (×3): 5 mg via ORAL
  Administered 2023-11-19: 10 mg via ORAL
  Administered 2023-11-19 (×3): 5 mg via ORAL
  Administered 2023-11-20 – 2023-11-21 (×5): 10 mg via ORAL
  Filled 2023-11-18: qty 1
  Filled 2023-11-18 (×2): qty 2
  Filled 2023-11-18: qty 1
  Filled 2023-11-18: qty 2
  Filled 2023-11-18: qty 1
  Filled 2023-11-18: qty 2
  Filled 2023-11-18 (×2): qty 1
  Filled 2023-11-18 (×2): qty 2
  Filled 2023-11-18: qty 1

## 2023-11-18 MED ORDER — ACETAMINOPHEN 500 MG PO TABS
1000.0000 mg | ORAL_TABLET | Freq: Three times a day (TID) | ORAL | Status: DC
  Administered 2023-11-18 – 2023-11-21 (×10): 1000 mg via ORAL
  Filled 2023-11-18 (×10): qty 2

## 2023-11-18 MED ORDER — HYDROMORPHONE HCL 1 MG/ML IJ SOLN
0.5000 mg | INTRAMUSCULAR | Status: DC | PRN
  Administered 2023-11-18: 0.5 mg via INTRAVENOUS
  Filled 2023-11-18: qty 0.5

## 2023-11-18 NOTE — Progress Notes (Signed)
Pt NG tube, foley and A-line removed per order. Sterile Vaseline gauze dsg applied to site clean, dry and intact; pt due to void. Dionne Bucy RN

## 2023-11-18 NOTE — Anesthesia Post-op Follow-up Note (Signed)
  Anesthesia Pain Follow-up Note  Patient: YAZLYN PROCK  Day #: 1  Date of Follow-up: 11/18/2023 Time: 9:08 AM  Last Vitals:  Vitals:   11/18/23 0321 11/18/23 0743  BP: (!) 141/68 127/70  Pulse:  96  Resp:  20  Temp: 37.1 C 36.7 C  SpO2:  100%    Level of Consciousness: alert  Pain: 10 /10   Side Effects:None  Catheter Site Exam:clean, dry, no drainage  Anti-Coag Meds (From admission, onward)    Start     Dose/Rate Route Frequency Ordered Stop   11/17/23 2200  heparin injection 5,000 Units        5,000 Units Subcutaneous Every 8 hours 11/17/23 1236        Epidural / Intrathecal (From admission, onward)    Start     Dose/Rate Route Frequency Ordered Stop   11/17/23 0830  ropivacaine (PF) 2 mg/mL (0.2%) (NAROPIN) injection        8 mL/hr 8 mL/hr  Epidural Continuous 11/17/23 0826          Plan: Continue current therapy of postop epidural at surgeon's request  Linton Rump

## 2023-11-18 NOTE — Progress Notes (Signed)
    1 Day Post-Op  Subjective: No acute issues. Reports difficulty sleeping due to pain, but more comfortable this morning after receiving IV pain meds.   Objective: Vital signs in last 24 hours: Temp:  [97.5 F (36.4 C)-98.8 F (37.1 C)] 98.8 F (37.1 C) (11/19 0321) Pulse Rate:  [51-84] 80 (11/18 2301) Resp:  [14-22] 14 (11/18 2301) BP: (93-141)/(41-68) 141/68 (11/19 0321) SpO2:  [94 %-100 %] 100 % (11/18 2301) Arterial Line BP: (88-109)/(42-45) 88/45 (11/18 1145) Last BM Date : 11/16/23  Intake/Output from previous day: 11/18 0701 - 11/19 0700 In: 2382.3 [I.V.:2093.1; IV Piggyback:200] Out: 1795 [Urine:800; Emesis/NG output:725; Drains:170; Blood:100] Intake/Output this shift: Total I/O In: 645.8 [I.V.:483.1; Other:12.7; IV Piggyback:150] Out: 960 [Urine:400; Emesis/NG output:475; Drains:85]  PE: General: resting comfortably, NAD Neuro: alert and oriented, no focal deficits HEENT: NG with blood-tinged drainage Resp: normal work of breathing CV: RRR Abdomen: soft, nondistended, appropriately tender. Upper midline incision clean and dry. JP serosanguinous, PTBD with bilious fluid. Extremities: warm and well-perfused GU: foley draining clear yellow urine   Lab Results:  No results for input(s): "WBC", "HGB", "HCT", "PLT" in the last 72 hours. BMET No results for input(s): "NA", "K", "CL", "CO2", "GLUCOSE", "BUN", "CREATININE", "CALCIUM" in the last 72 hours. PT/INR No results for input(s): "LABPROT", "INR" in the last 72 hours. CMP     Component Value Date/Time   NA 131 (L) 11/12/2023 0850   K 4.5 11/12/2023 0850   CL 97 (L) 11/12/2023 0850   CO2 27 11/12/2023 0850   GLUCOSE 132 (H) 11/12/2023 0850   BUN 17 11/12/2023 0850   CREATININE 0.93 11/12/2023 0850   CREATININE 0.53 11/11/2023 1048   CALCIUM 9.7 11/12/2023 0850   PROT 6.5 11/11/2023 1048   ALBUMIN 3.8 11/11/2023 1048   AST 41 11/11/2023 1048   ALT 56 (H) 11/11/2023 1048   ALKPHOS 343 (H)  11/11/2023 1048   BILITOT 1.3 (H) 11/11/2023 1048   GFRNONAA 59 (L) 11/12/2023 0850   GFRNONAA >60 11/11/2023 1048   Lipase     Component Value Date/Time   LIPASE 87 (H) 07/07/2023 1038         Assessment/Plan  89 yo female with pancreatic adenocarcinoma, POD1 s/p cholecystectomy and roux-en-Y choledochojejunostomy. - Remove NG, advance to clear liquid diet - Maintenance fluids at 50 ml/hr - Pain control: epidural, prn dilaudid, scheduled tylenol and robaxin. Add prn oxycodone. - Remove foley - Mobilize, PT ordered - VTE: SQH, SCDs - Dispo: progressive care      LOS: 1 day    Sophronia Simas, MD [facility] General, Hepatobiliary and Pancreatic Surgery 11/18/23 6:38 AM

## 2023-11-18 NOTE — Evaluation (Signed)
Physical Therapy Evaluation Patient Details Name: Wendy [name] MRN: [id number] DOB: Feb 28, 1934 Today's Date: 11/18/2023  History of Present Illness  Patient is an 89 y/o female admitted 11/17/23 for Whipple procedure due to pancreatic cancer, tumor was found adherent to colon so procedure changed to cholecystectomy, and Roux-en-Y choledochojejunostomy.  PMH positive for arthritis, HTN, HLD, vit D deficiency, duodenal stent placed 07/10/23, and had IR placement of percutaneous transhepatic biliary drain on 11/04/23.  She is s/p chemo treatment last cycle on 10/13/23.  Clinical Impression  Patient presents with decreased mobility due to pain, decreased strength, decreased balance, and limited activity tolerance.  Previously walking independently and over long distances and denies falls at home. Lives with her spouse who recently had heart cath last week and had back surgery in May.  Also has family staying with them who can assist.  Feel she will benefit from HHPT and will need to use her walker initially as well.         If plan is discharge home, recommend the following: A little help with walking and/or transfers;A little help with bathing/dressing/bathroom;Help with stairs or ramp for entrance;Assist for transportation   Can travel by private vehicle        Equipment Recommendations None recommended by PT  Recommendations for Other Services       Functional Status Assessment Patient has had a recent decline in their functional status and demonstrates the ability to make significant improvements in function in a reasonable and predictable amount of time.     Precautions / Restrictions Precautions Precautions: Fall Precaution Comments: hepatic biliary drain, JP drain R flank; epidural anlagesia      Mobility  Bed Mobility               General bed mobility comments: in recliner    Transfers Overall transfer level: Needs assistance Equipment used: None Transfers: Sit  to/from Stand Sit to Stand: Contact guard assist           General transfer comment: assist for lines and initially for balance in standing    Ambulation/Gait Ambulation/Gait assistance: Contact guard assist Gait Distance (Feet): 300 Feet Assistive device: None, IV Pole Gait Pattern/deviations: Step-through pattern, Decreased stride length, Wide base of support       General Gait Details: instability walking without UE support, so pt help IV pole with both hands, discussed using walker initially  Stairs            Wheelchair Mobility     Tilt Bed    Modified Rankin (Stroke Patients Only)       Balance Overall balance assessment: Needs assistance   Sitting balance-Leahy Scale: Good     Standing balance support: No upper extremity supported Standing balance-Leahy Scale: Fair Standing balance comment: static standing without UE support, but needed support for dynamic mobility                             Pertinent Vitals/Pain Pain Assessment Pain Assessment: 0-10 Pain Score: 4  Pain Location: abdomen Pain Descriptors / Indicators: Sore Pain Intervention(s): Monitored during session, Premedicated before session    Home Living Family/patient expects to be discharged to:: Private residence Living Arrangements: Spouse/significant other     Home Access: Stairs to enter Entrance Stairs-Rails: Right Entrance Stairs-Number of Steps: 6   Home Layout: Two level;Able to live on main level with bedroom/bathroom Home Equipment: Shower seat;Rolling Walker (2 wheels);BSC/3in1;Cane - single point;Wheelchair -  manual      Prior Function Prior Level of Function : Independent/Modified Independent                     Extremity/Trunk Assessment   Upper Extremity Assessment Upper Extremity Assessment: Overall WFL for tasks assessed    Lower Extremity Assessment Lower Extremity Assessment: Generalized weakness    Cervical / Trunk  Assessment Cervical / Trunk Assessment: Other exceptions Cervical / Trunk Exceptions: abdominal surgery  Communication   Communication Communication: No apparent difficulties  Cognition Arousal: Alert Behavior During Therapy: WFL for tasks assessed/performed Overall Cognitive Status: Impaired/Different from baseline Area of Impairment: Problem solving, Attention                   Current Attention Level: Selective         Problem Solving: Slow processing General Comments: some difficulty choosing liquids for ordering her meal; needed mod cues for choosing from right menu, increased time with multimodal cues, then finally gave up and asked her daughter to order for her        General Comments General comments (skin integrity, edema, etc.): Spouse present initially, daughter present throughout and reports plans to stay with pt and her spouse (spouse recent cardiac cath and back surgery)    Exercises     Assessment/Plan    PT Assessment Patient needs continued PT services  PT Problem List Decreased strength;Decreased activity tolerance;Decreased mobility;Decreased balance;Decreased safety awareness;Decreased knowledge of use of DME       PT Treatment Interventions DME instruction;Gait training;Stair training;Patient/family education;Functional mobility training;Therapeutic activities;Therapeutic exercise    PT Goals (Current goals can be found in the Care Plan section)  Acute Rehab PT Goals Patient Stated Goal: return to independent PT Goal Formulation: With patient/family Time For Goal Achievement: 12/02/23 Potential to Achieve Goals: Good    Frequency Min 1X/week     Co-evaluation               AM-PAC PT "6 Clicks" Mobility  Outcome Measure Help needed turning from your back to your side while in a flat bed without using bedrails?: A Little Help needed moving from lying on your back to sitting on the side of a flat bed without using bedrails?: A  Little   Help needed standing up from a chair using your arms (e.g., wheelchair or bedside chair)?: A Little Help needed to walk in hospital room?: A Little Help needed climbing 3-5 steps with a railing? : A Little 6 Click Score: 15    End of Session Equipment Utilized During Treatment: Gait belt Activity Tolerance: Patient tolerated treatment well Patient left: in chair;with family/visitor present;with call bell/phone within reach Nurse Communication: Mobility status PT Visit Diagnosis: Other abnormalities of gait and mobility (R26.89);Muscle weakness (generalized) (M62.81)    Time: 1610-9604 PT Time Calculation (min) (ACUTE ONLY): 29 min   Charges:   PT Evaluation $PT Eval Moderate Complexity: 1 Mod PT Treatments $Gait Training: 8-22 mins PT General Charges $$ ACUTE PT VISIT: 1 Visit         Sheran Lawless, PT Acute Rehabilitation Services Office:[phone] 11/18/2023   Wendy [name] 11/18/2023, 2:25 PM

## 2023-11-18 NOTE — Anesthesia Postprocedure Evaluation (Signed)
Anesthesia Post Note  Patient: Wendy [name]  Procedure(s) Performed: EXPLORATORY LAPAROTOMY OPEN CHOLECYSTECTOMY OPEN ROUX-EN-Y HEPATIC-JEJUNOSTOMY     Patient location during evaluation: PACU Anesthesia Type: Epidural and General Level of consciousness: awake and alert Pain management: pain level controlled Vital Signs Assessment: post-procedure vital signs reviewed and stable Respiratory status: spontaneous breathing, nonlabored ventilation, respiratory function stable and patient connected to nasal cannula oxygen Cardiovascular status: blood pressure returned to baseline and stable Postop Assessment: no apparent nausea or vomiting Anesthetic complications: no   No notable events documented.           Franshesca Chipman

## 2023-11-18 NOTE — Progress Notes (Signed)
1645: Pt unable to urinate since removal of foley this am at 0800. Pt attempted x 2 on the BSC and BR but still not able to void. Pt In & Out cath per protocol for 300 ml. Urine amber color and not cloudy. Perineal care performed prior to catheterization and after. Pt voices relief. Dionne Bucy RN

## 2023-11-19 ENCOUNTER — Other Ambulatory Visit: Payer: Self-pay

## 2023-11-19 ENCOUNTER — Encounter: Payer: Self-pay | Admitting: *Deleted

## 2023-11-19 ENCOUNTER — Encounter (HOSPITAL_COMMUNITY): Payer: Self-pay | Admitting: Radiology

## 2023-11-19 MED ORDER — METHOCARBAMOL 500 MG PO TABS
500.0000 mg | ORAL_TABLET | Freq: Three times a day (TID) | ORAL | Status: DC
  Administered 2023-11-19 – 2023-11-21 (×6): 500 mg via ORAL
  Filled 2023-11-19 (×6): qty 1

## 2023-11-19 MED ORDER — FAMOTIDINE 20 MG PO TABS
40.0000 mg | ORAL_TABLET | Freq: Every day | ORAL | Status: DC
  Administered 2023-11-19 – 2023-11-21 (×3): 40 mg via ORAL
  Filled 2023-11-19 (×3): qty 2

## 2023-11-19 MED ORDER — POLYETHYLENE GLYCOL 3350 17 G PO PACK
17.0000 g | PACK | Freq: Every day | ORAL | Status: DC | PRN
  Administered 2023-11-20: 17 g via ORAL
  Filled 2023-11-19: qty 1

## 2023-11-19 MED ORDER — DOCUSATE SODIUM 100 MG PO CAPS
100.0000 mg | ORAL_CAPSULE | Freq: Two times a day (BID) | ORAL | Status: DC
  Administered 2023-11-19 – 2023-11-21 (×5): 100 mg via ORAL
  Filled 2023-11-19 (×5): qty 1

## 2023-11-19 NOTE — Anesthesia Post-op Follow-up Note (Signed)
  Anesthesia Pain Follow-up Note  Patient: Wendy [name]  Day #: 2  Date of Follow-up: 11/19/2023 Time: 1:29 PM  Last Vitals:  Vitals:   11/19/23 0730 11/19/23 1120  BP: 127/72 (!) 105/58  Pulse: 76 89  Resp: 15 15  Temp: 36.7 C 36.9 C  SpO2: 100% 100%    Level of Consciousness: alert  Pain: 4 /10   Side Effects:None  Catheter Site Exam:clean, dry, no drainage  Anti-Coag Meds (From admission, onward)    Start     Dose/Rate Route Frequency Ordered Stop   11/17/23 2200  heparin injection 5,000 Units        5,000 Units Subcutaneous Every 8 hours 11/17/23 1236 11/19/23 2359      Epidural / Intrathecal (From admission, onward)    Start     Dose/Rate Route Frequency Ordered Stop   11/17/23 0830  ropivacaine (PF) 2 mg/mL (0.2%) (NAROPIN) injection        8 mL/hr 8 mL/hr  Epidural Continuous 11/17/23 0826          Plan: Continue current therapy of postop epidural at surgeon's request   [place]

## 2023-11-19 NOTE — Progress Notes (Signed)
Oncology Discharge Planning Note  [facility] Address: [address], [place], [place] [address] Hours of Operation:  Lewayne Bunting, Monday - Friday  Clinic Contact Information:  [phone]) [phone]  Oncology Care Team: Medical Oncologist:  Truett Perna  Patient Details: Name:  Wendy [name], Wendy [name] MRN:   [id number] DOB:   July 16, 1934 Reason for Current Admission: @PPROB @  Discharge Planning Narrative: Notification of admission received by Inpatient team for [facility].  Discharge follow-up appointments for oncology are current and available on the AVS and MyChart.   Upon discharge from the hospital, hematology/oncology's post discharge plan of care for the outpatient setting is: December 09, 2023 at  10:30  Dr Mardelle Matte [facility] [address] [place], [place] [address]  [phone]  Wendy [name] will be called within two business days after discharge to review hematology/oncology's plan of care for full understanding.    Outpatient Oncology Specific Care Only: Oncology appointment transportation needs addressed?:  no Oncology medication management for symptom management addressed?:  not applicable Chemo Alert Card reviewed?:  not applicable Immunotherapy Alert Card reviewed?:  not applicable

## 2023-11-19 NOTE — Plan of Care (Signed)
  Problem: Activity: Goal: Risk for activity intolerance will decrease Outcome: Progressing   

## 2023-11-19 NOTE — Addendum Note (Signed)
Addendum  created 11/19/23 1329 by [place], MD   Clinical Note Signed

## 2023-11-19 NOTE — Progress Notes (Signed)
Physical Therapy Treatment Patient Details Name: Wendy [name] MRN: [id number] DOB: 07/01/1934 Today's Date: 11/19/2023   History of Present Illness Patient is an 89 y/o female admitted 11/17/23 for Whipple procedure due to pancreatic cancer, tumor was found adherent to colon so procedure changed to cholecystectomy, and Roux-en-Y choledochojejunostomy.  PMH positive for arthritis, HTN, HLD, vit D deficiency, duodenal stent placed 07/10/23, and had IR placement of percutaneous transhepatic biliary drain on 11/04/23.  She is s/p chemo treatment last cycle on 10/13/23.    PT Comments  Patient up in recliner, alert and reporting she has been up to use bathroom multiple times with nursing and did not have to hold onto IV pole when walking. Assessed gait without device and pt slightly slower and guarded, however without imbalance. She was able to perform head turns without imbalance. She prefers not to use a device and was not deemed necessary. Returned to recliner with her breakfast in place.      If plan is discharge home, recommend the following: A little help with walking and/or transfers;A little help with bathing/dressing/bathroom;Help with stairs or ramp for entrance;Assist for transportation   Can travel by private vehicle        Equipment Recommendations  None recommended by PT    Recommendations for Other Services       Precautions / Restrictions Precautions Precautions: Fall Precaution Comments: hepatic biliary drain, JP drain R flank; epidural anlagesia Restrictions Weight Bearing Restrictions: No     Mobility  Bed Mobility               General bed mobility comments: in recliner    Transfers Overall transfer level: Needs assistance Equipment used: None Transfers: Sit to/from Stand Sit to Stand: Supervision           General transfer comment: assist for lines only    Ambulation/Gait Ambulation/Gait assistance: Supervision Gait Distance (Feet): 100  Feet Assistive device: None Gait Pattern/deviations: Step-through pattern, Decreased stride length, Wide base of support   Gait velocity interpretation: 1.31 - 2.62 ft/sec, indicative of limited community ambulator   General Gait Details: no UE support needed, slightly slow and guarded but no imbalance noted   Stairs             Wheelchair Mobility     Tilt Bed    Modified Rankin (Stroke Patients Only)       Balance Overall balance assessment: Needs assistance   Sitting balance-Leahy Scale: Good     Standing balance support: No upper extremity supported Standing balance-Leahy Scale: Good                              Cognition Arousal: Alert Behavior During Therapy: WFL for tasks assessed/performed Overall Cognitive Status: Impaired/Different from baseline Area of Impairment: Problem solving, Attention                   Current Attention Level: Selective         Problem Solving: Slow processing General Comments: very aware of and attending to lines, drains. Did not attempt alternating or divided attention.        Exercises      General Comments General comments (skin integrity, edema, etc.): Spouse present      Pertinent Vitals/Pain Pain Assessment Pain Assessment: 0-10 Pain Score: 2  Pain Location: abdomen Pain Descriptors / Indicators: Sore Pain Intervention(s): Monitored during session    Home Living  Prior Function            PT Goals (current goals can now be found in the care plan section) Acute Rehab PT Goals Patient Stated Goal: return to independent Time For Goal Achievement: 12/02/23 Potential to Achieve Goals: Good Progress towards PT goals: Progressing toward goals    Frequency    Min 1X/week      PT Plan      Co-evaluation              AM-PAC PT "6 Clicks" Mobility   Outcome Measure  Help needed turning from your back to your side while in a flat bed  without using bedrails?: A Little Help needed moving from lying on your back to sitting on the side of a flat bed without using bedrails?: A Little Help needed moving to and from a bed to a chair (including a wheelchair)?: A Little Help needed standing up from a chair using your arms (e.g., wheelchair or bedside chair)?: A Little Help needed to walk in hospital room?: A Little Help needed climbing 3-5 steps with a railing? : A Little 6 Click Score: 18    End of Session   Activity Tolerance: Patient tolerated treatment well Patient left: in chair;with family/visitor present;with call bell/phone within reach;with chair alarm set   PT Visit Diagnosis: Other abnormalities of gait and mobility (R26.89);Muscle weakness (generalized) (M62.81)     Time: 9528-4132 PT Time Calculation (min) (ACUTE ONLY): 15 min  Charges:    $Gait Training: 8-22 mins PT General Charges $$ ACUTE PT VISIT: 1 Visit                      Jerolyn Center, PT Acute Rehabilitation Services  Office [phone]    Zena Amos 11/19/2023, 8:51 AM

## 2023-11-19 NOTE — Progress Notes (Signed)
    2 Days Post-Op  Subjective: No acute issues. Pain well-controlled. Reports she walked in the halls. Tolerating clear liquids. Output from PTC overnight was very minimal. Cathed yesterday for urinary retention but has voided spontaneously since then.   Objective: Vital signs in last 24 hours: Temp:  [97.3 F (36.3 C)-98.5 F (36.9 C)] 98 F (36.7 C) (11/20 0730) Pulse Rate:  [76-98] 76 (11/20 0730) Resp:  [14-22] 15 (11/20 0730) BP: (96-128)/(61-75) 127/72 (11/20 0730) SpO2:  [98 %-100 %] 100 % (11/20 0730) Last BM Date : 11/16/23  Intake/Output from previous day: 11/19 0701 - 11/20 0700 In: 2738.7 [P.O.:240; I.V.:1969.1; IV Piggyback:250] Out: 970 [Urine:500; Emesis/NG output:100; Drains:370] Intake/Output this shift: Total I/O In: 104.3 [I.V.:86.7; Other:17.6] Out: -   PE: General: resting comfortably, NAD Neuro: alert and oriented, no focal deficits Resp: normal work of breathing CV: RRR Abdomen: soft, nondistended, appropriately tender. Upper midline incision clean and dry. JP serosanguinous, PTBD with minimal bilious fluid. Extremities: warm and well-perfused    Lab Results:  Recent Labs    11/18/23 0727  WBC 12.5*  HGB 9.8*  HCT 30.4*  PLT 299   BMET Recent Labs    11/18/23 0727  NA 133*  K 4.0  CL 101  CO2 24  GLUCOSE 155*  BUN 19  CREATININE 0.64  CALCIUM 9.1   PT/INR No results for input(s): "LABPROT", "INR" in the last 72 hours. CMP     Component Value Date/Time   NA 133 (L) 11/18/2023 0727   K 4.0 11/18/2023 0727   CL 101 11/18/2023 0727   CO2 24 11/18/2023 0727   GLUCOSE 155 (H) 11/18/2023 0727   BUN 19 11/18/2023 0727   CREATININE 0.64 11/18/2023 0727   CREATININE 0.53 11/11/2023 1048   CALCIUM 9.1 11/18/2023 0727   PROT 5.1 (L) 11/18/2023 0727   ALBUMIN 2.4 (L) 11/18/2023 0727   AST 41 11/18/2023 0727   AST 41 11/11/2023 1048   ALT 36 11/18/2023 0727   ALT 56 (H) 11/11/2023 1048   ALKPHOS 167 (H) 11/18/2023 0727    BILITOT 0.9 11/18/2023 0727   BILITOT 1.3 (H) 11/11/2023 1048   GFRNONAA >60 11/18/2023 0727   GFRNONAA >60 11/11/2023 1048   Lipase     Component Value Date/Time   LIPASE 87 (H) 07/07/2023 1038         Assessment/Plan  89 yo female with pancreatic adenocarcinoma, POD2 s/p cholecystectomy and roux-en-Y choledochojejunostomy. - Advance to regular diet, SLIV - Epidural in place for pain control, will request removal tomorrow if patient is tolerating diet. Continue prn oxycodone and dilaudid, and scheduled tylenol and robaxin. - Mobilize, PT following, plan for HHPT at discharge. - PTBD drain was flushed this morning and flushed easily with no resistance. Bilious fluid was aspirated. Drain was capped. Check LFTs tomorrow morning. - VTE: SQH, SCDs - Dispo: progressive care      LOS: 2 days    Sophronia Simas, MD [facility] General, Hepatobiliary and Pancreatic Surgery 11/19/23 10:40 AM

## 2023-11-19 NOTE — Progress Notes (Signed)
   11/19/23 1211  TOC Brief Assessment  Insurance and Status Reviewed  Patient has primary care physician Yes  Home environment has been reviewed home w/ spouse  Prior level of function: independent  Prior/Current Home Services No current home services  Social Determinants of Health Reivew SDOH reviewed no interventions necessary  Readmission risk has been reviewed Yes  Transition of care needs transition of care needs identified, TOC will continue to follow    Pt from home w/ spouse, also has family assisting. Per PT eval recommendations for HHPT follow up- will need order placed prior to discharge for HHPT.  CM will follow for transition needs and [facility] referral.  No DME needs noted.

## 2023-11-20 ENCOUNTER — Inpatient Hospital Stay (HOSPITAL_COMMUNITY): Payer: Medicare Other | Admitting: Anesthesiology

## 2023-11-20 LAB — COMPREHENSIVE METABOLIC PANEL
ALT: 28 U/L (ref 0–44)
AST: 31 U/L (ref 15–41)
Albumin: 2.4 g/dL — ABNORMAL LOW (ref 3.5–5.0)
Alkaline Phosphatase: 164 U/L — ABNORMAL HIGH (ref 38–126)
Anion gap: 7 (ref 5–15)
BUN: 18 mg/dL (ref 8–23)
CO2: 23 mmol/L (ref 22–32)
Calcium: 8.9 mg/dL (ref 8.9–10.3)
Chloride: 98 mmol/L (ref 98–111)
Creatinine, Ser: 0.72 mg/dL (ref 0.44–1.00)
GFR, Estimated: >60 mL/min (ref >60)
Glucose, Bld: 104 mg/dL — ABNORMAL HIGH (ref 70–99)
Potassium: 3.9 mmol/L (ref 3.5–5.1)
Sodium: 128 mmol/L — ABNORMAL LOW (ref 135–145)
Total Bilirubin: 1 mg/dL (ref <1.2)
Total Protein: 5.2 g/dL — ABNORMAL LOW (ref 6.5–8.1)

## 2023-11-20 LAB — CBC
HCT: 31.6 % — ABNORMAL LOW (ref 36.0–46.0)
Hemoglobin: 10.1 g/dL — ABNORMAL LOW (ref 12.0–15.0)
MCH: 28.4 pg (ref 26.0–34.0)
MCHC: 32 g/dL (ref 30.0–36.0)
MCV: 88.8 fL (ref 80.0–100.0)
Platelets: 331 10*3/uL (ref 150–400)
RBC: 3.56 MIL/uL — ABNORMAL LOW (ref 3.87–5.11)
RDW: 16.2 % — ABNORMAL HIGH (ref 11.5–15.5)
WBC: 7.7 10*3/uL (ref 4.0–10.5)
nRBC: 0 % (ref 0.0–0.2)

## 2023-11-20 MED ORDER — ENOXAPARIN SODIUM 40 MG/0.4ML IJ SOSY: 40.0000 mg | PREFILLED_SYRINGE | INTRAMUSCULAR | Status: DC

## 2023-11-20 MED ORDER — SODIUM CHLORIDE 0.9 % IV SOLN: INTRAVENOUS | Status: DC

## 2023-11-20 NOTE — Progress Notes (Signed)
PT Cancellation Note  Patient Details Name: Wendy [name] MRN: [id number] DOB: May 16, 1934   Cancelled Treatment:    Reason Eval/Treat Not Completed: Patient declined, no reason specified.  Too painful today. 11/20/2023  Jacinto Halim., PT Acute Rehabilitation Services [phone]  (office)   Eliseo Gum Sheryn Aldaz 11/20/2023, 3:47 PM

## 2023-11-20 NOTE — Anesthesia Post-op Follow-up Note (Signed)
  Anesthesia Pain Follow-up Note  Patient: Wendy [name]  Day #: 3  Date of Follow-up: 11/20/2023 Time: 11:51 AM  Last Vitals:  Vitals:   11/20/23 0320 11/20/23 0850  BP: 127/66 (!) 129/54  Pulse: 82 (!) 106  Resp: 20 16  Temp: 36.9 C 36.4 C  SpO2: 98% 92%    Level of Consciousness: alert  Pain: mild   Side Effects:None  Catheter Site Exam:clean, dry, no drainage  Anti-Coag Meds (From admission, onward)    Start     Dose/Rate Route Frequency Ordered Stop   11/20/23 2000  enoxaparin (LOVENOX) injection 40 mg        40 mg Subcutaneous Every 24 hours 11/20/23 0653        Epidural / Intrathecal (From admission, onward)    Start     Dose/Rate Route Frequency Ordered Stop   11/17/23 0830  ropivacaine (PF) 2 mg/mL (0.2%) (NAROPIN) injection        8 mL/hr 8 mL/hr  Epidural Continuous 11/17/23 0826          Plan: Catheter removed/tip intact at surgeon's request and D/C Infusion at surgeon's request  Beryle Lathe

## 2023-11-20 NOTE — Anesthesia Post-op Follow-up Note (Deleted)
  Anesthesia Pain Follow-up Note  Patient: CASSEDY GUBA  Day #: 3  Date of Follow-up: 11/20/2023 Time: 11:49 AM  Last Vitals:  Vitals:   11/20/23 0320 11/20/23 0850  BP: 127/66 (!) 129/54  Pulse: 82 (!) 106  Resp: 20 16  Temp: 36.9 C 36.4 C  SpO2: 98% 92%    Level of Consciousness: alert  Pain: mild   Side Effects:None  Catheter Site Exam:clean, dry, no drainage  Anti-Coag Meds (From admission, onward)    Start     Dose/Rate Route Frequency Ordered Stop   11/20/23 2000  enoxaparin (LOVENOX) injection 40 mg        40 mg Subcutaneous Every 24 hours 11/20/23 0653        Epidural / Intrathecal (From admission, onward)    Start     Dose/Rate Route Frequency Ordered Stop   11/17/23 0830  ropivacaine (PF) 2 mg/mL (0.2%) (NAROPIN) injection        8 mL/hr 8 mL/hr  Epidural Continuous 11/17/23 0826          Plan: Catheter removed/tip intact at surgeon's request and D/C Infusion at surgeon's request  Beryle Lathe

## 2023-11-20 NOTE — TOC Initial Note (Signed)
Transition of Care (TOC) - Initial/Assessment Note  Donn Pierini RN,BSN Transitions of Care Unit 4NP (Non Trauma)- RN Case Manager See Treatment Team for direct Phone #   Patient Details  Name: Wendy [name] MRN: [id number] Date of Birth: Oct 05, 1934  Transition of Care [facility]) CM/SW Contact:    Darrold Span, RN Phone Number: 11/20/2023, 11:13 AM  Clinical Narrative:                 Noted HHPT order placed and EDD for tomorrow 11/22.   CM in to speak with pt, family also present at bedside (spouse and daughter).  Discussed HH recommendations and MD order. Pt voiced that she is not sure she wants/needs HHPT. Voiced she ambulated around unit without assist today and did very well. Pt and family states they would like time to think and discuss [facility]- list provided for choice Per CMS guidelines from [email or web address] website with star ratings (copy placed in shadow chart)- CM will follow up in the am to see if pt would like [facility] referral.  Pt voiced she has needed DME at home- no new DME needs at this time.   TOC to follow up in am for HH/transition needs.   Expected Discharge Plan: Home w Home Health Services Barriers to Discharge: Continued Medical Work up   Patient Goals and CMS Choice Patient states their goals for this hospitalization and ongoing recovery are:: return home CMS Medicare.gov Compare Post Acute Care list provided to:: Patient Choice offered to / list presented to : Patient, Adult Children      Expected Discharge Plan and Services   Discharge Planning Services: CM Consult Post Acute Care Choice: Home Health Living arrangements for the past 2 months: Single Family Home                 DME Arranged: N/A DME Agency: NA       HH Arranged: PT          Prior Living Arrangements/Services Living arrangements for the past 2 months: Single Family Home Lives with:: Spouse Patient language and need for interpreter reviewed:: Yes Do you feel safe going  back to the place where you live?: Yes      Need for Family Participation in Patient Care: Yes (Comment) Care giver support system in place?: Yes (comment) Current home services: DME Criminal Activity/Legal Involvement Pertinent to Current Situation/Hospitalization: No - Comment as needed  Activities of Daily Living   ADL Screening (condition at time of admission) Independently performs ADLs?: Yes (appropriate for developmental age) Is the patient deaf or have difficulty hearing?: No Does the patient have difficulty seeing, even when wearing glasses/contacts?: No Does the patient have difficulty concentrating, remembering, or making decisions?: No  Permission Sought/Granted Permission sought to share information with : Facility Medical sales representative                Emotional Assessment Appearance:: Appears stated age Attitude/Demeanor/Rapport: Engaged Affect (typically observed): Appropriate, Pleasant Orientation: : Oriented to Self, Oriented to Place, Oriented to  Time, Oriented to Situation Alcohol / Substance Use: Not Applicable Psych Involvement: No (comment)  Admission diagnosis:  Pancreatic cancer [facility]) [C25.9] Patient Active Problem List   Diagnosis Date Noted   Pancreatic cancer (HCC) 11/04/2023   Biliary obstruction 11/04/2023   Normocytic anemia 11/04/2023   Paroxysmal atrial fibrillation (HCC) 11/04/2023   Hypertensive urgency 11/04/2023   GERD (gastroesophageal reflux disease) 11/04/2023   Genetic testing 08/06/2023   Family history of pancreatic cancer  Family history of melanoma    Family history of uterine cancer    Cancer of head of pancreas (HCC) 07/24/2023   Malnutrition of moderate degree 07/08/2023   Pancreatic mass 07/07/2023   Medication management 10/02/2017   Irregular heart rate 10/02/2017   Sinus bradycardia 10/02/2017   Premature atrial contractions 10/02/2017   Hypertension    Hyperlipidemia    Vitamin D deficiency    PCP:  [name], MD Pharmacy:   [facility] [address] [place] - [address] [place] [address] Phone: [phone] Fax: [phone]     Social Determinants of Health (SDOH) Social History: SDOH Screenings   Food Insecurity: No Food Insecurity (11/17/2023)  Housing: Low Risk  (11/17/2023)  Transportation Needs: No Transportation Needs (11/17/2023)  Utilities: Not At Risk (11/17/2023)  Depression (PHQ2-9): Low Risk  (08/04/2023)  Financial Resource Strain: Low Risk  (08/05/2023)  Tobacco Use: Low Risk  (11/17/2023)  Health Literacy: Adequate Health Literacy (08/05/2023)   SDOH Interventions:     Readmission Risk Interventions     No data to display

## 2023-11-20 NOTE — Addendum Note (Signed)
Addendum  created 11/20/23 1152 by Beryle Lathe, MD   Clinical Note Signed

## 2023-11-20 NOTE — Plan of Care (Signed)
  Problem: Nutrition: Goal: Adequate nutrition will be maintained Outcome: Progressing   

## 2023-11-20 NOTE — Progress Notes (Signed)
    3 Days Post-Op  Subjective: No acute issues overnight. Pain controlled. No fevers with PTBD capped. Passing flatus, no BM yet.   Objective: Vital signs in last 24 hours: Temp:  [97.6 F (36.4 C)-98.5 F (36.9 C)] 98.5 F (36.9 C) (11/21 0320) Pulse Rate:  [76-100] 82 (11/21 0320) Resp:  [14-20] 20 (11/21 0320) BP: (105-127)/(55-72) 127/66 (11/21 0320) SpO2:  [98 %-100 %] 98 % (11/21 0320) Last BM Date : 11/16/23  Intake/Output from previous day: 11/20 0701 - 11/21 0700 In: 1488.7 [P.O.:1200; I.V.:86.7] Out: 125 [Drains:125] Intake/Output this shift: Total I/O In: 357.2 [P.O.:240; Other:117.2] Out: 30 [Drains:30]  PE: General: resting comfortably, NAD Neuro: alert and oriented, no focal deficits Resp: normal work of breathing on room air CV: RRR Abdomen: soft, nondistended, appropriately tender. Upper midline incision clean and dry. JP serosanguinous, PTBD capped. Extremities: warm and well-perfused    Lab Results:  Recent Labs    11/18/23 0727  WBC 12.5*  HGB 9.8*  HCT 30.4*  PLT 299   BMET Recent Labs    11/18/23 0727  NA 133*  K 4.0  CL 101  CO2 24  GLUCOSE 155*  BUN 19  CREATININE 0.64  CALCIUM 9.1   PT/INR No results for input(s): "LABPROT", "INR" in the last 72 hours. CMP     Component Value Date/Time   NA 133 (L) 11/18/2023 0727   K 4.0 11/18/2023 0727   CL 101 11/18/2023 0727   CO2 24 11/18/2023 0727   GLUCOSE 155 (H) 11/18/2023 0727   BUN 19 11/18/2023 0727   CREATININE 0.64 11/18/2023 0727   CREATININE 0.53 11/11/2023 1048   CALCIUM 9.1 11/18/2023 0727   PROT 5.1 (L) 11/18/2023 0727   ALBUMIN 2.4 (L) 11/18/2023 0727   AST 41 11/18/2023 0727   AST 41 11/11/2023 1048   ALT 36 11/18/2023 0727   ALT 56 (H) 11/11/2023 1048   ALKPHOS 167 (H) 11/18/2023 0727   BILITOT 0.9 11/18/2023 0727   BILITOT 1.3 (H) 11/11/2023 1048   GFRNONAA >60 11/18/2023 0727   GFRNONAA >60 11/11/2023 1048   Lipase     Component Value Date/Time    LIPASE 87 (H) 07/07/2023 1038         Assessment/Plan  89 yo female with pancreatic adenocarcinoma, POD3 s/p cholecystectomy and roux-en-Y choledochojejunostomy. - Soft diet - Request epidural removal today. DVT ppx held for removal. Continue prn oxycodone and dilaudid for pain control. Scheduled tylenol and robaxin. - Mobilize, PT following, plan for HHPT at discharge. - Keep PTBD capped. Plan for removal of surgical JP drain prior to discharge. - VTE: SCDs, transition to lovenox this evening after epidural removed - Dispo: progressive care. Anticipate discharge home tomorrow.    LOS: 3 days    Sophronia Simas, MD [facility] Surgery General, Hepatobiliary and Pancreatic Surgery 11/20/23 6:48 AM

## 2023-11-21 LAB — BASIC METABOLIC PANEL
Anion gap: 6 (ref 5–15)
BUN: 15 mg/dL (ref 8–23)
CO2: 24 mmol/L (ref 22–32)
Calcium: 9 mg/dL (ref 8.9–10.3)
Chloride: 102 mmol/L (ref 98–111)
Creatinine, Ser: 0.62 mg/dL (ref 0.44–1.00)
GFR, Estimated: >60 mL/min (ref >60)
Glucose, Bld: 103 mg/dL — ABNORMAL HIGH (ref 70–99)
Potassium: 4.3 mmol/L (ref 3.5–5.1)
Sodium: 132 mmol/L — ABNORMAL LOW (ref 135–145)

## 2023-11-21 MED ORDER — SODIUM CHLORIDE 0.9% FLUSH: 10.0000 mL | Freq: Every day | INTRAVENOUS | 1 refills | Status: DC

## 2023-11-21 MED ORDER — POLYETHYLENE GLYCOL 3350 17 G PO PACK
17.0000 g | PACK | Freq: Every day | ORAL | Status: DC
  Administered 2023-11-21: 17 g via ORAL
  Filled 2023-11-21: qty 1

## 2023-11-21 MED ORDER — METHOCARBAMOL 500 MG PO TABS: 500.0000 mg | ORAL_TABLET | Freq: Three times a day (TID) | ORAL | 0 refills | Status: DC | PRN

## 2023-11-21 MED ORDER — BENAZEPRIL HCL 20 MG PO TABS
20.0000 mg | ORAL_TABLET | Freq: Every day | ORAL | Status: DC
  Administered 2023-11-21: 20 mg via ORAL
  Filled 2023-11-21: qty 1

## 2023-11-21 MED ORDER — OXYCODONE HCL 5 MG PO TABS
5.0000 mg | ORAL_TABLET | Freq: Four times a day (QID) | ORAL | 0 refills | Status: AC | PRN
Start: 2023-11-21 — End: 2023-11-28

## 2023-11-21 NOTE — Discharge Instructions (Addendum)
CENTRAL Wendy Wendy [name] DISCHARGE INSTRUCTIONS  Activity No heavy lifting greater than 15 pounds for 8 weeks after Wendy [name]. Ok to shower, but do not bathe or submerge incision underwater. Do not drive until cleared by your surgeon. Be sure to walk around your home at least 3 times daily. This will help avoid blood clots and will improve your strength.  Wound Care Your incision is covered with skin glue called Dermabond. This will peel off on its own over time. You may shower and allow warm soapy water to run over your incision. Gently pat dry. Do not submerge your incision or your drain underwater. Monitor your incision for any new redness, tenderness, or drainage. Many patients will experience some swelling and bruising at the incisions.  Ice packs will help.  Swelling and bruising can take several days to resolve.   Medications A  prescription for pain medication may be given to you upon discharge.  Take your pain medication as prescribed, if needed.  If narcotic pain medicine is not needed, then you may take acetaminophen (Tylenol) as needed. Do not take more than 4000mg  Tylenol in a single 24-hour period. It is common to experience some constipation if taking pain medication after Wendy [name].  Increasing fluid intake and taking a stool softener (such as Colace) will usually help or prevent this problem from occurring.  A mild laxative (Milk of Magnesia or Miralax) should be taken according to package directions if there are no bowel movements after 48 hours. Take your usually prescribed medications unless otherwise directed. If you need a refill on your pain medication, please contact your pharmacy.  They will contact our office to request authorization. Prescriptions will not be filled after 5 pm or on weekends.  Drain Care Please flush your drain every day with 10mL sterile saline. Call the office if you have resistance with flushing, or a lot of leakage around your drain.  When to Call  us: Fever greater than 100.5 New redness, drainage, or swelling at incision site Severe pain, nausea, or vomiting Excessive vomiting or inability to keep down any liquids Low blood sugar or very high blood sugar (greater than 300) Jaundice (yellowing of the whites of the eyes or skin)  Follow-up We will call you to schedule a follow up appointment with Dr. Freida [name] in 3-4 weeks. This will be at the [facility] Wendy [name] office at [address], [address], [place], [place]. Please arrive at least 15 minutes prior to your scheduled appointment time. If you need to reschedule your appointment, please call the office.  IF YOU HAVE DISABILITY OR FAMILY LEAVE FORMS, YOU MUST BRING THEM TO THE OFFICE FOR PROCESSING.   DO NOT GIVE THEM TO YOUR DOCTOR.  The clinic staff is available to answer your questions during regular business hours.  Please don't hesitate to call and ask to speak to one of the nurses for clinical concerns.  If you have a medical emergency, go to the nearest emergency room or call 911.  A surgeon from [facility] Wendy [name] is always on call at the hospital  [address], [address], [place], [place]  [address] ?  [address] [address], [place], [place]   [address] [phone] ? Toll Free: [phone] ? FAX [phone] Web site: [email or web address]

## 2023-11-21 NOTE — Progress Notes (Signed)
    4 Days Post-Op  Subjective: Had some increased pain after epidural removal yesterday, but felt better overnight and this morning. Passing flatus. Tolerating diet, no vomiting. Ambulating independently.    Objective: Vital signs in last 24 hours: Temp:  [97.7 F (36.5 C)-98.3 F (36.8 C)] 97.7 F (36.5 C) (11/22 0740) Pulse Rate:  [82-107] 96 (11/22 0740) Resp:  [12-16] 12 (11/22 0300) BP: (126-175)/(70-95) 145/80 (11/22 0740) SpO2:  [91 %-100 %] 100 % (11/22 0740) Last BM Date : 11/16/23  Intake/Output from previous day: 11/21 0701 - 11/22 0700 In: 943.9 [P.O.:240; I.V.:683.9] Out: 120 [Drains:120] Intake/Output this shift: No intake/output data recorded.  PE: General: resting comfortably, NAD Neuro: alert and oriented, no focal deficits Resp: normal work of breathing on room air CV: RRR Abdomen: soft, nondistended, appropriately tender. Upper midline incision clean and dry. JP serosanguinous, PTBD capped. Extremities: warm and well-perfused    Lab Results:  Recent Labs    11/20/23 0706  WBC 7.7  HGB 10.1*  HCT 31.6*  PLT 331   BMET Recent Labs    11/20/23 0706 11/21/23 0708  NA 128* 132*  K 3.9 4.3  CL 98 102  CO2 23 24  GLUCOSE 104* 103*  BUN 18 15  CREATININE 0.72 0.62  CALCIUM 8.9 9.0   PT/INR No results for input(s): "LABPROT", "INR" in the last 72 hours. CMP     Component Value Date/Time   NA 132 (L) 11/21/2023 0708   K 4.3 11/21/2023 0708   CL 102 11/21/2023 0708   CO2 24 11/21/2023 0708   GLUCOSE 103 (H) 11/21/2023 0708   BUN 15 11/21/2023 0708   CREATININE 0.62 11/21/2023 0708   CREATININE 0.53 11/11/2023 1048   CALCIUM 9.0 11/21/2023 0708   PROT 5.2 (L) 11/20/2023 0706   ALBUMIN 2.4 (L) 11/20/2023 0706   AST 31 11/20/2023 0706   AST 41 11/11/2023 1048   ALT 28 11/20/2023 0706   ALT 56 (H) 11/11/2023 1048   ALKPHOS 164 (H) 11/20/2023 0706   BILITOT 1.0 11/20/2023 0706   BILITOT 1.3 (H) 11/11/2023 1048   GFRNONAA >60  11/21/2023 0708   GFRNONAA >60 11/11/2023 1048   Lipase     Component Value Date/Time   LIPASE 87 (H) 07/07/2023 1038         Assessment/Plan  89 yo female with pancreatic adenocarcinoma, POD4 s/p cholecystectomy and roux-en-Y choledochojejunostomy. - Soft diet - Surgical JP drain was removed this morning. PTBD is to remain in place for 1 month postop, capped. Patient is to flush once a day at home to maintain patency. - Mobilize, PT following. Discussed with patient this morning and she reports she has been ambulating independently and does not feel she needs HHPT. - Multimodal pain control - VTE: SCDs, lovenox - Dispo: med-surg status. Likely discharge home this afternoon.    LOS: 4 days    Sophronia Simas, MD [facility] Surgery General, Hepatobiliary and Pancreatic Surgery 11/21/23 9:16 AM

## 2023-11-21 NOTE — Discharge Summary (Signed)
Physician Discharge Summary   Patient ID: Wendy [name] [id number] 89 y.o. 1934-12-24  Admit date: 11/17/2023  Discharge date and time: 11/21/2023  1:53 PM   Admitting Physician: [name], MD   Discharge Physician: Sophronia Simas  Admission Diagnoses: Pancreatic cancer [facility]) [C25.9]  Discharge Diagnoses: Pancreatic cancer  Admission Condition: good  Discharged Condition: good  Indication for Admission: Wendy [name] is an 89 yo female who was diagnosed with pancreatic cancer after presenting with gastric outlet obstruction. She underwent placement of an endoluminal duodenal stent, and completed 6 cycles neoadjuvant gemcitabine/abraxane. She then developed jaundice and had an internal/external PTBD placed on 11/7, with improvement in her LFTs. Her CA19-9 has downtrended during treatment and her disease appears resectable on imaging. After an extensive discussion with the patient and her family regarding the risks and benefits of a Whipple procedure, she agreed to proceed with resection.  Hospital Course: The patient was taken to the OR on 11/18. At laparotomy her tumor was found to involve the transverse mesocolon with SMV abutment. Resection was aborted given her advanced age, and risks of bleeding and additional morbidity from a partial colectomy. A cholecystectomy and palliative biliary bypass were performed. Postoperatively she was admitted to progressive care in stable condition. She had an epidural, foley, NG tube, surgical drain and PTBD. NG tube was removed on POD1 and she was advanced to a clear liquid diet. Her foley was removed. Diet was advanced to a soft diet over the next few days. Her PTBD was internalized on POD2, and LFTs the following morning remained normal. Her surgical JP drain remained serosanguinous after internalization of the PTBD, and the JP was removed on POD4. Epidural was removed on POD3. The patient was able to ambulate independently. On the morning of POD4,  she was ambulating, tolerating a solid diet, passing flatus, and pain was controlled on oral medications. She was examined and deemed appropriate for discharge home. Her PTBD is to remain in place for 1 month postop.  Consults: None  Significant Diagnostic Studies:  Surgical Pathology: A. GALLBLADDER, CHOLECYSTECTOMY:  - Chronic cholecystitis with cholelithiasis  - No malignancy identified   Treatments: IV hydration, analgesia: acetaminophen, Dilaudid, oxycodone and epidural, therapies: PT, and surgery: Open cholecystectomy and roux-en-Y choledochojejunostomy  Discharge Exam: General: resting comfortably, NAD Neuro: alert and oriented, no focal deficits Resp: normal work of breathing on room air CV: RRR Abdomen: soft, nondistended, nontender to palpation. Upper midline incision clean and dry. RUQ PTBD in place, capped. Extremities: warm and well-perfused   Disposition:  Discharge disposition: 01-Home or Self Care       Patient Instructions:  Allergies as of 11/21/2023       Reactions   Bactrim [sulfamethoxazole-trimethoprim] Nausea And Vomiting, Other (See Comments)   "Knocked her for a loop"- dizziness and weakness, also   Protonix [pantoprazole] Rash        Medication List     TAKE these medications    acetaminophen 500 MG tablet Commonly known as: TYLENOL Take 1,000 mg by mouth every 6 (six) hours as needed for moderate pain (pain score 4-6) (ususally at night).   benazepril 20 MG tablet Commonly known as: LOTENSIN Take 20 mg by mouth daily.   cyanocobalamin 1000 MCG tablet Commonly known as: VITAMIN B12 Take 1,000 mcg by mouth in the morning.   docusate sodium 100 MG capsule Commonly known as: COLACE Take 1 capsule (100 mg total) by mouth 2 (two) times daily.   famotidine 40 MG tablet Commonly known  as: PEPCID Take 40 mg by mouth daily.   ferrous sulfate 325 (65 FE) MG EC tablet Take 1 tablet (325 mg total) by mouth daily with breakfast.    lidocaine-prilocaine cream Commonly known as: EMLA APPLY 1 APPLICATION TOPICALLY AS NEEDED **APPLY TO PORT 1-2 HOURS PRIOR TO APPOINTMENT   methocarbamol 500 MG tablet Commonly known as: ROBAXIN Take 1 tablet (500 mg total) by mouth 3 (three) times daily as needed for muscle spasms.   multivitamin with minerals Tabs tablet Take 1 tablet by mouth daily.   ondansetron 4 MG tablet Commonly known as: ZOFRAN Take 1 tablet (4 mg total) by mouth every 6 (six) hours as needed for nausea.   oxyCODONE 5 MG immediate release tablet Commonly known as: Oxy IR/ROXICODONE Take 1-2 tablets (5-10 mg total) by mouth every 6 (six) hours as needed for up to 7 days (5mg  for moderate pain, 10mg  for severe pain). What changed: reasons to take this   polyethylene glycol 17 g packet Commonly known as: MIRALAX / GLYCOLAX Take 17 g by mouth daily as needed for mild constipation. What changed: when to take this   prochlorperazine 5 MG tablet Commonly known as: COMPAZINE Take 1 tablet (5 mg total) by mouth every 6 (six) hours as needed for nausea or vomiting.   simvastatin 40 MG tablet Commonly known as: ZOCOR Take 20 mg by mouth daily with supper.   sodium chloride flush 0.9 % Soln Commonly known as: NS 10 mLs by Intracatheter route daily. Flush abdominal drain with 10mL saline once a day.       Activity: no driving for 3 weeks and no heavy lifting for 8 weeks Diet:  Soft diet Wound Care: keep wound clean and dry and flush drain once daily  Follow-up with Dr. [name] in 3 weeks.  Signed: [name] 11/21/2023 3:54 PM

## 2023-11-21 NOTE — TOC Transition Note (Signed)
Transition of Care (TOC) - CM/SW Discharge Note Donn Pierini RN,BSN Transitions of Care Unit 4NP (Non Trauma)- RN Case Manager See Treatment Team for direct Phone #   Patient Details  Name: Wendy [name] MRN: [id number] Date of Birth: 08-Oct-1934  Transition of Care [facility]) CM/SW Contact:  Darrold Span, RN Phone Number: 11/21/2023, 11:43 AM   Clinical Narrative:    Per MD- plan to d/c later this afternoon, pt/family has discussed HH with MD and pt does not feel she needs HH as she is mobilizing independently. MD has canceled [facility] order.   CM spoke with pt and family to follow up and confirmed no HH referral will be made as per pt choice that she does not feel she needs HH at this time.   Family at bedside and will transport home this afternoon.   No further TOC needs noted.    Final next level of care: Home/Self Care Barriers to Discharge: Barriers Resolved   Patient Goals and CMS Choice CMS Medicare.gov Compare Post Acute Care list provided to:: Patient Choice offered to / list presented to : Patient, Adult Children  Discharge Placement                 Home        Discharge Plan and Services Additional resources added to the After Visit Summary for     Discharge Planning Services: CM Consult Post Acute Care Choice: Home Health          DME Arranged: N/A DME Agency: NA       HH Arranged: Patient Refused HH HH Agency: NA        Social Determinants of Health (SDOH) Interventions SDOH Screenings   Food Insecurity: No Food Insecurity (11/17/2023)  Housing: Low Risk  (11/17/2023)  Transportation Needs: No Transportation Needs (11/17/2023)  Utilities: Not At Risk (11/17/2023)  Depression (PHQ2-9): Low Risk  (08/04/2023)  Financial Resource Strain: Low Risk  (08/05/2023)  Tobacco Use: Low Risk  (11/17/2023)  Health Literacy: Adequate Health Literacy (08/05/2023)     Readmission Risk Interventions    11/21/2023   11:43 AM  Readmission Risk  Prevention Plan  Transportation Screening Complete  Home Care Screening Complete  Medication Review (RN CM) Complete

## 2023-11-21 NOTE — Progress Notes (Signed)
Pt A&Ox4. Discharge education given to pt and family. Care plan and education completed. Telemetry and PIV removed. Abd incision clean, dry, intact. Pt to pick up electronically sent prescription from preferred pharmacy on file. Handed printed prescription for NS flushes to pt. Pt discharged home with family to transport pt home. Wheelchair offered to pt, pt declined, and decided to ambulate off unit with personnel belongings to the side with RN and family.

## 2023-11-21 NOTE — Care Management Important Message (Signed)
Important Message  Patient Details  Name: DIERA WALSINGHAM MRN: [id number] Date of Birth: January 08, 1934   Important Message Given:  Yes - Medicare IM     Sherilyn Banker 11/21/2023, 9:22 AM

## 2023-11-22 ENCOUNTER — Other Ambulatory Visit: Payer: Self-pay | Admitting: Oncology

## 2023-11-22 DIAGNOSIS — C250 Malignant neoplasm of head of pancreas: Secondary | ICD-10-CM

## 2023-11-22 LAB — BPAM RBC
Blood Product Expiration Date: 202412132359
Blood Product Expiration Date: 202412132359
Blood Product Expiration Date: 202412132359
Blood Product Expiration Date: 202412142359
ISSUE DATE / TIME: 202411180749
ISSUE DATE / TIME: 202411180749
Unit Type and Rh: 6200
Unit Type and Rh: 6200
Unit Type and Rh: 6200
Unit Type and Rh: 6200

## 2023-11-22 LAB — TYPE AND SCREEN
ABO/RH(D): A POS
Antibody Screen: NEGATIVE
Unit division: 0
Unit division: 0
Unit division: 0
Unit division: 0

## 2023-11-25 ENCOUNTER — Inpatient Hospital Stay: Payer: Medicare Other

## 2023-11-25 ENCOUNTER — Telehealth: Payer: Self-pay | Admitting: *Deleted

## 2023-11-25 ENCOUNTER — Other Ambulatory Visit: Payer: Self-pay | Admitting: *Deleted

## 2023-11-25 ENCOUNTER — Inpatient Hospital Stay (HOSPITAL_BASED_OUTPATIENT_CLINIC_OR_DEPARTMENT_OTHER): Payer: Medicare Other | Admitting: Oncology

## 2023-11-25 VITALS — BP 119/69 | HR 86 | Temp 98.1°F | Resp 18 | Ht 66.0 in | Wt 124.3 lb

## 2023-11-25 DIAGNOSIS — E611 Iron deficiency: Secondary | ICD-10-CM | POA: Diagnosis not present

## 2023-11-25 DIAGNOSIS — I4891 Unspecified atrial fibrillation: Secondary | ICD-10-CM | POA: Diagnosis not present

## 2023-11-25 DIAGNOSIS — C250 Malignant neoplasm of head of pancreas: Secondary | ICD-10-CM | POA: Diagnosis not present

## 2023-11-25 DIAGNOSIS — Z7901 Long term (current) use of anticoagulants: Secondary | ICD-10-CM | POA: Diagnosis not present

## 2023-11-25 DIAGNOSIS — E785 Hyperlipidemia, unspecified: Secondary | ICD-10-CM | POA: Diagnosis not present

## 2023-11-25 DIAGNOSIS — E042 Nontoxic multinodular goiter: Secondary | ICD-10-CM | POA: Diagnosis not present

## 2023-11-25 DIAGNOSIS — E538 Deficiency of other specified B group vitamins: Secondary | ICD-10-CM | POA: Diagnosis not present

## 2023-11-25 DIAGNOSIS — R918 Other nonspecific abnormal finding of lung field: Secondary | ICD-10-CM | POA: Diagnosis not present

## 2023-11-25 LAB — CBC WITH DIFFERENTIAL (CANCER CENTER ONLY)
Abs Immature Granulocytes: 0.04 10*3/uL (ref 0.00–0.07)
Basophils Absolute: 0 10*3/uL (ref 0.0–0.1)
Basophils Relative: 1 %
Eosinophils Absolute: 0.4 10*3/uL (ref 0.0–0.5)
Eosinophils Relative: 5 %
HCT: 29.5 % — ABNORMAL LOW (ref 36.0–46.0)
Hemoglobin: 9.4 g/dL — ABNORMAL LOW (ref 12.0–15.0)
Immature Granulocytes: 1 %
Lymphocytes Relative: 12 %
Lymphs Abs: 0.9 10*3/uL (ref 0.7–4.0)
MCH: 29.1 pg (ref 26.0–34.0)
MCHC: 31.9 g/dL (ref 30.0–36.0)
MCV: 91.3 fL (ref 80.0–100.0)
Monocytes Absolute: 0.7 10*3/uL (ref 0.1–1.0)
Monocytes Relative: 9 %
Neutro Abs: 5.7 10*3/uL (ref 1.7–7.7)
Neutrophils Relative %: 72 %
Platelet Count: 373 10*3/uL (ref 150–400)
RBC: 3.23 MIL/uL — ABNORMAL LOW (ref 3.87–5.11)
RDW: 16.7 % — ABNORMAL HIGH (ref 11.5–15.5)
WBC Count: 7.8 10*3/uL (ref 4.0–10.5)
nRBC: 0 % (ref 0.0–0.2)

## 2023-11-25 LAB — CMP (CANCER CENTER ONLY)
ALT: 25 U/L (ref 0–44)
AST: 27 U/L (ref 15–41)
Albumin: 3.1 g/dL — ABNORMAL LOW (ref 3.5–5.0)
Alkaline Phosphatase: 161 U/L — ABNORMAL HIGH (ref 38–126)
Anion gap: 5 (ref 5–15)
BUN: 14 mg/dL (ref 8–23)
CO2: 28 mmol/L (ref 22–32)
Calcium: 8.8 mg/dL — ABNORMAL LOW (ref 8.9–10.3)
Chloride: 96 mmol/L — ABNORMAL LOW (ref 98–111)
Creatinine: 0.51 mg/dL (ref 0.44–1.00)
GFR, Estimated: >60 mL/min (ref >60)
Glucose, Bld: 112 mg/dL — ABNORMAL HIGH (ref 70–99)
Potassium: 4.4 mmol/L (ref 3.5–5.1)
Sodium: 129 mmol/L — ABNORMAL LOW (ref 135–145)
Total Bilirubin: 0.7 mg/dL (ref <1.2)
Total Protein: 5.3 g/dL — ABNORMAL LOW (ref 6.5–8.1)

## 2023-11-25 MED ORDER — HEPARIN SOD (PORK) LOCK FLUSH 100 UNIT/ML IV SOLN
500.0000 [IU] | Freq: Once | INTRAVENOUS | Status: AC
  Administered 2023-11-25: 500 [IU] via INTRAVENOUS

## 2023-11-25 NOTE — Progress Notes (Signed)
[facility] OFFICE PROGRESS NOTE   Diagnosis: Pancreas cancer  INTERVAL HISTORY:   Wendy [name] underwent exploratory laparotomy, cholecystectomy, and choledochojejunostomy 11/17/2023.  There was no evidence of metastatic disease at the time of surgery.  A tumor was noted in the head of the pancreas with involvement of the transverse mesocolon and distal SMV.  A decision was made to abort the planned pancreas resection. Wendy [name] continues to have discomfort at the midline surgical site.  A biliary drain remains in place.  She is eating and ambulating in the home.  Objective:  Vital signs in last 24 hours:  Blood pressure 119/69, pulse 86, temperature 98.1 F (36.7 C), temperature source Temporal, resp. rate 18, height 5\' 6"  (1.676 m), weight 124 lb 4.8 oz (56.4 kg), SpO2 100%.    HEENT: No thrush Resp: Coarse end inspiratory rhonchi at the right posterior base, no respiratory distress Cardio: Irregular GI: No hepatosplenomegaly, right upper abdomen biliary drain, healed midline incision  Vascular: Trace-1+ pitting edema at the lower leg and ankle bilaterally     Portacath/PICC-without erythema  Lab Results:  Lab Results  Component Value Date   WBC 7.8 11/25/2023   HGB 9.4 (L) 11/25/2023   HCT 29.5 (L) 11/25/2023   MCV 91.3 11/25/2023   PLT 373 11/25/2023   NEUTROABS 5.7 11/25/2023    CMP  Lab Results  Component Value Date   NA 129 (L) 11/25/2023   K 4.4 11/25/2023   CL 96 (L) 11/25/2023   CO2 28 11/25/2023   GLUCOSE 112 (H) 11/25/2023   BUN 14 11/25/2023   CREATININE 0.51 11/25/2023   CALCIUM 8.8 (L) 11/25/2023   PROT 5.3 (L) 11/25/2023   ALBUMIN 3.1 (L) 11/25/2023   AST 27 11/25/2023   ALT 25 11/25/2023   ALKPHOS 161 (H) 11/25/2023   BILITOT 0.7 11/25/2023   GFRNONAA >60 11/25/2023    Lab Results  Component Value Date   CAN199 199 (H) 11/11/2023    Lab Results  Component Value Date   INR 1.0 11/04/2023   LABPROT 13.8 11/04/2023     Imaging:  No results found.  Medications: I have reviewed the patient's current medications.   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  Fluid-filled and distended stomach and proximal duodenum, single 11 mm periportal lymph node EGD 07/08/2023-large food residue in the stomach, extrinsic deformity at the first portion of the duodenum-biopsied Elevated CA 19-9 CT chest 07/11/2023-scattered small pulmonary nodules-indeterminate Cycle 1 gemcitabine/Abraxane 08/05/2023 Cycle 2 gemcitabine/Abraxane 08/19/2023 Cycle 3 gemcitabine/Abraxane 09/02/2023 Cycle 4 gemcitabine/Abraxane 09/16/2023 Cycle 5 gemcitabine/Abraxane 09/30/2023 CTs 10/09/2023-increase size of pancreas mass, increased pancreatic duct caliber and new biliary dilation, bilateral lower lobe nodules are stable, hypodense left thyroid nodule Cycle 6 gemcitabine/Abraxane 10/13/2023 10/28/2023 chemotherapy held due to LFT abnormalities Biliary drain placed 11/04/2023; internal/external biliary drain 11/06/2023 Exploratory laparotomy, cholecystectomy, choledochojejunostomy 11/17/2023-bulky tumor of the pancreas head with involvement of the transverse mesocolon and probable involvement of the distal SMV-plan for pancreaticoduodenectomy aborted   2.   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   Anemia 4.   Iron deficiency 5.   B12 deficiency 6.   Atrial fibrillation-maintained on Xarelto prior to hospital admission 7.  Family history of pancreas and  uterine cancer 8.  Hyperlipidemia 9.  Genetics-negative    Disposition: Wendy [name] is recovering from the exploratory laparotomy  and choledochojejunostomy.  I discussed the operative findings with Wendy [name] and her family.  The tumor was deemed unresectable based on the extent of surgery required to remove the tumor with negative margins.  There is no clinical or radiologic evidence of distant metastatic disease.  We discussed treatment options.  There is no evidence of disease progression while on gemcitabine/Abraxane.  We discussed continuing gemcitabine/Abraxane, changing to a different systemic therapy regimen, and referring her to consider radiation.  We specifically discussed capecitabine/radiation.  I reviewed potential toxicities associated with capecitabine including the chance of mucositis, diarrhea, rash, sun sensitivity, and hand/foot syndrome.  The plan is to proceed with capecitabine/radiation if she is a candidate for radiation.  I will present her case at the GI tumor conference on 12/03/2023.  Wendy [name] will return for an office visit in 2 weeks.  [name], MD  11/25/2023  9:16 AM

## 2023-11-25 NOTE — Telephone Encounter (Signed)
Daughter called to clarify when case will be discussed at GI Conference. Informed her it will be presented on 12/04. Also informed her that per nurse navigator, the clinical trial they mentioned has closed to enrollment as of 2023.

## 2023-11-26 ENCOUNTER — Other Ambulatory Visit: Payer: Self-pay

## 2023-12-03 ENCOUNTER — Other Ambulatory Visit: Payer: Self-pay | Admitting: *Deleted

## 2023-12-03 NOTE — Progress Notes (Signed)
The proposed treatment discussed in conference is for discussion purpose only and is not a binding recommendation.  The patients have not been physically examined, or presented with their treatment options.  Therefore, final treatment plans cannot be decided.  

## 2023-12-04 NOTE — Addendum Note (Signed)
Addendum  created 12/04/23 0914 by Val Eagle, MD   Clinical Note Signed, Intraprocedure Blocks edited, SmartForm saved

## 2023-12-09 ENCOUNTER — Inpatient Hospital Stay: Payer: Medicare Other

## 2023-12-09 ENCOUNTER — Inpatient Hospital Stay: Payer: Medicare Other | Attending: Oncology

## 2023-12-09 ENCOUNTER — Inpatient Hospital Stay (HOSPITAL_BASED_OUTPATIENT_CLINIC_OR_DEPARTMENT_OTHER): Payer: Medicare Other | Admitting: Nurse Practitioner

## 2023-12-09 ENCOUNTER — Encounter: Payer: Self-pay | Admitting: Nurse Practitioner

## 2023-12-09 VITALS — BP 130/66 | HR 91 | Temp 97.9°F | Resp 18 | Ht 66.0 in | Wt 116.2 lb

## 2023-12-09 DIAGNOSIS — Z7901 Long term (current) use of anticoagulants: Secondary | ICD-10-CM | POA: Insufficient documentation

## 2023-12-09 DIAGNOSIS — I4891 Unspecified atrial fibrillation: Secondary | ICD-10-CM | POA: Diagnosis not present

## 2023-12-09 DIAGNOSIS — E785 Hyperlipidemia, unspecified: Secondary | ICD-10-CM | POA: Diagnosis not present

## 2023-12-09 DIAGNOSIS — D649 Anemia, unspecified: Secondary | ICD-10-CM | POA: Diagnosis not present

## 2023-12-09 DIAGNOSIS — C250 Malignant neoplasm of head of pancreas: Secondary | ICD-10-CM

## 2023-12-09 DIAGNOSIS — E611 Iron deficiency: Secondary | ICD-10-CM | POA: Insufficient documentation

## 2023-12-09 DIAGNOSIS — E041 Nontoxic single thyroid nodule: Secondary | ICD-10-CM | POA: Diagnosis not present

## 2023-12-09 DIAGNOSIS — Z95828 Presence of other vascular implants and grafts: Secondary | ICD-10-CM

## 2023-12-09 DIAGNOSIS — R918 Other nonspecific abnormal finding of lung field: Secondary | ICD-10-CM | POA: Insufficient documentation

## 2023-12-09 DIAGNOSIS — M545 Low back pain, unspecified: Secondary | ICD-10-CM | POA: Diagnosis not present

## 2023-12-09 DIAGNOSIS — E538 Deficiency of other specified B group vitamins: Secondary | ICD-10-CM | POA: Insufficient documentation

## 2023-12-09 LAB — CMP (CANCER CENTER ONLY)
ALT: 11 U/L (ref 0–44)
AST: 17 U/L (ref 15–41)
Albumin: 3.5 g/dL (ref 3.5–5.0)
Alkaline Phosphatase: 113 U/L (ref 38–126)
Anion gap: 7 (ref 5–15)
BUN: 19 mg/dL (ref 8–23)
CO2: 26 mmol/L (ref 22–32)
Calcium: 9.3 mg/dL (ref 8.9–10.3)
Chloride: 100 mmol/L (ref 98–111)
Creatinine: 0.54 mg/dL (ref 0.44–1.00)
GFR, Estimated: >60 mL/min (ref >60)
Glucose, Bld: 137 mg/dL — ABNORMAL HIGH (ref 70–99)
Potassium: 4.5 mmol/L (ref 3.5–5.1)
Sodium: 133 mmol/L — ABNORMAL LOW (ref 135–145)
Total Bilirubin: 0.6 mg/dL (ref <1.2)
Total Protein: 5.9 g/dL — ABNORMAL LOW (ref 6.5–8.1)

## 2023-12-09 LAB — CBC WITH DIFFERENTIAL (CANCER CENTER ONLY)
Abs Immature Granulocytes: 0.02 10*3/uL (ref 0.00–0.07)
Basophils Absolute: 0.1 10*3/uL (ref 0.0–0.1)
Basophils Relative: 1 %
Eosinophils Absolute: 0.3 10*3/uL (ref 0.0–0.5)
Eosinophils Relative: 4 %
HCT: 30.8 % — ABNORMAL LOW (ref 36.0–46.0)
Hemoglobin: 9.7 g/dL — ABNORMAL LOW (ref 12.0–15.0)
Immature Granulocytes: 0 %
Lymphocytes Relative: 12 %
Lymphs Abs: 0.7 10*3/uL (ref 0.7–4.0)
MCH: 28.8 pg (ref 26.0–34.0)
MCHC: 31.5 g/dL (ref 30.0–36.0)
MCV: 91.4 fL (ref 80.0–100.0)
Monocytes Absolute: 0.6 10*3/uL (ref 0.1–1.0)
Monocytes Relative: 11 %
Neutro Abs: 4.2 10*3/uL (ref 1.7–7.7)
Neutrophils Relative %: 72 %
Platelet Count: 323 10*3/uL (ref 150–400)
RBC: 3.37 MIL/uL — ABNORMAL LOW (ref 3.87–5.11)
RDW: 15.8 % — ABNORMAL HIGH (ref 11.5–15.5)
WBC Count: 5.9 10*3/uL (ref 4.0–10.5)
nRBC: 0 % (ref 0.0–0.2)

## 2023-12-09 MED ORDER — SODIUM CHLORIDE 0.9% FLUSH
10.0000 mL | INTRAVENOUS | Status: AC | PRN
  Administered 2023-12-09: 10 mL via INTRAVENOUS

## 2023-12-09 MED ORDER — OXYCODONE HCL 5 MG PO TABS
5.0000 mg | ORAL_TABLET | Freq: Four times a day (QID) | ORAL | 0 refills | Status: DC | PRN
Start: 2023-12-09 — End: 2023-12-23

## 2023-12-09 MED ORDER — METHOCARBAMOL 500 MG PO TABS: 500.0000 mg | ORAL_TABLET | Freq: Three times a day (TID) | ORAL | 0 refills | Status: DC | PRN

## 2023-12-09 MED ORDER — HEPARIN SOD (PORK) LOCK FLUSH 100 UNIT/ML IV SOLN
500.0000 [IU] | Freq: Once | INTRAVENOUS | Status: AC
  Administered 2023-12-09: 500 [IU] via INTRAVENOUS

## 2023-12-09 NOTE — Addendum Note (Signed)
Addended by: Dimitri Ped on: 12/09/2023 12:02 PM   Modules accepted: Orders

## 2023-12-09 NOTE — Progress Notes (Unsigned)
[facility] OFFICE PROGRESS NOTE   Diagnosis: Pancreas cancer  INTERVAL HISTORY:   Ms. Wendy [name] returns as scheduled.  Overall she feels better.  She notes an alteration in taste.  She is drinking a boost nutritional supplement every day.  No nausea or vomiting.  No constipation or diarrhea.  She has intermittent low back pain.  Objective:  Vital signs in last 24 hours:  Blood pressure 130/66, pulse 91, temperature 97.9 F (36.6 C), temperature source Oral, resp. rate 18, height 5\' 6"  (1.676 m), weight 116 lb 3.2 oz (52.7 kg), SpO2 100%.    HEENT: No thrush or ulcers. Resp: Lungs clear bilaterally. Cardio: Regular rate and rhythm. GI: Healed midline incision.  Right upper abdomen biliary drain. Vascular: Trace lower leg edema bilaterally. Port-A-Cath without erythema.  Lab Results:  Lab Results  Component Value Date   WBC 5.9 12/09/2023   HGB 9.7 (L) 12/09/2023   HCT 30.8 (L) 12/09/2023   MCV 91.4 12/09/2023   PLT 323 12/09/2023   NEUTROABS 4.2 12/09/2023    Imaging:  No results found.  Medications: I have reviewed the patient's current medications.  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  Fluid-filled and distended stomach and proximal duodenum, single 11 mm periportal lymph node EGD 07/08/2023-large food residue in the stomach, extrinsic deformity at the first portion of the duodenum-biopsied Elevated CA 19-9 CT chest 07/11/2023-scattered small pulmonary nodules-indeterminate Cycle 1 gemcitabine/Abraxane 08/05/2023 Cycle 2 gemcitabine/Abraxane 08/19/2023 Cycle 3 gemcitabine/Abraxane 09/02/2023 Cycle 4 gemcitabine/Abraxane 09/16/2023 Cycle 5  gemcitabine/Abraxane 09/30/2023 CTs 10/09/2023-increase size of pancreas mass, increased pancreatic duct caliber and new biliary dilation, bilateral lower lobe nodules are stable, hypodense left thyroid nodule Cycle 6 gemcitabine/Abraxane 10/13/2023 10/28/2023 chemotherapy held due to LFT abnormalities Biliary drain placed 11/04/2023; internal/external biliary drain 11/06/2023 Exploratory laparotomy, cholecystectomy, choledochojejunostomy 11/17/2023-bulky tumor of the pancreas head with involvement of the transverse mesocolon and probable involvement of the distal SMV-plan for pancreaticoduodenectomy aborted Case presented at GI tumor conference 12/03/2023-recommendation to proceed with capecitabine/radiation Referred to Dr. Mitzi Hansen 12/09/2023   2.   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   Anemia 4.   Iron deficiency 5.   B12 deficiency 6.   Atrial fibrillation-maintained on Xarelto prior to hospital admission 7.  Family history of pancreas and uterine cancer 8.  Hyperlipidemia 9.  Genetics-negative    Disposition: Wendy [name] appears stable.  Her case was presented at a recent GI tumor conference.  Recommendation is to proceed with capecitabine/radiation.  She and her daughter are in agreement.  Referral placed to Dr. Mitzi Hansen today.  We reviewed potential side effects associated with capecitabine including bone marrow toxicity, nausea, mouth sores, hair loss, diarrhea, rash, hand-foot syndrome.  She was provided with printed information.  She agrees to proceed.  Anticipate start date around 12/29/2023.  She is taking oxycodone and Robaxin as needed for back pain.  New prescriptions sent to her pharmacy today.  CBC and chemistry panel reviewed.  Bilirubin remains in normal range.  She will follow-up with Dr. [name] regarding the biliary drain.  She will return for lab and follow-up 01/06/2024.  We are available to see her sooner if  needed.  Patient seen with Dr. Truett Perna.   Lonna Cobb  ANP/GNP-BC   12/09/2023  11:33 AM This was a shared visit with Lonna Cobb.  Wendy [name] continues to recover from the abdominal surgery.  She has been diagnosed with locally advanced pancreas cancer.  Her case was presented at the GI tumor conference.  The conference recommendation is to proceed with concurrent capecitabine and radiation.  We will make a referral to Dr. Mitzi Hansen.  We reviewed potential toxicities associated with capecitabine.  She agrees to proceed.  Prescription for capecitabine was entered.  We anticipate a start date of 12/29/2023.  Is present for greater than 50% of today's visit.  I performed medical decision making.  Mancel Bale, MD

## 2023-12-10 LAB — CANCER ANTIGEN 19-9: CA 19-9: 205 U/mL — ABNORMAL HIGH (ref 0–35)

## 2023-12-11 ENCOUNTER — Encounter: Payer: Self-pay | Admitting: Oncology

## 2023-12-11 ENCOUNTER — Telehealth: Payer: Self-pay

## 2023-12-11 ENCOUNTER — Other Ambulatory Visit (HOSPITAL_COMMUNITY): Payer: Self-pay

## 2023-12-11 MED ORDER — CAPECITABINE 500 MG PO TABS: ORAL_TABLET | ORAL | 0 refills | Status: DC

## 2023-12-11 NOTE — Telephone Encounter (Signed)
Oral Oncology Patient Advocate Encounter  After completing a benefits investigation, prior authorization for Capecitabine is not required at this time through Medicare Part A&B.  Patient's copay is $22.72.     Ardeen Fillers, CPhT Oncology Pharmacy Patient Advocate  [facility]  [phone] (phone) [phone] (fax) 12/11/2023 3:39 PM

## 2023-12-12 ENCOUNTER — Telehealth: Payer: Self-pay | Admitting: Pharmacist

## 2023-12-12 DIAGNOSIS — C250 Malignant neoplasm of head of pancreas: Secondary | ICD-10-CM

## 2023-12-12 MED ORDER — CAPECITABINE 500 MG PO TABS
1000.0000 mg | ORAL_TABLET | Freq: Two times a day (BID) | ORAL | 0 refills | Status: DC
  Filled 2023-12-16: qty 112, 28d supply, fill #0

## 2023-12-12 NOTE — Telephone Encounter (Signed)
Clinical Pharmacist Practitioner Encounter   Received new prescription for Xeloda (capecitabine) for the treatment of stage IIa pancreatic cancer in conjunction with radiation, planned duration until the end of radiation. Planned start 12/29/23 along with the start of radiation.  CMP from 12/09/23 assessed, no relevant lab abnormalities. Patient's CrCl is <67ml/min so patient will started  Prescription dose and frequency assessed.   Current medication list in Epic reviewed, no relevant DDIs with capecitabine identified.  Evaluated chart and no patient barriers to medication adherence identified.   Prescription has been e-scribed to the [facility] for benefits analysis and approval.  Oral Oncology Clinic will continue to follow for insurance authorization, copayment issues, initial counseling and start date.  Patient agreed to treatment on 12/09/23 per MD documentation.  Remi Haggard, PharmD, BCPS, BCOP, CPP Hematology/Oncology Clinical Pharmacist Practitioner [facility] [phone]  12/12/2023 9:21 AM

## 2023-12-15 NOTE — Progress Notes (Signed)
Radiation Oncology         [phone] ________________________________  Name: [name]        MRN: [id number]  Date of Service: 12/16/2023 DOB: 09/05/1934  [id number], [name], MD  Ladene Artist, MD     REFERRING PHYSICIAN: Ladene Artist, MD   DIAGNOSIS: The encounter diagnosis was Cancer of head of pancreas [facility]).   HISTORY OF PRESENT ILLNESS: Wendy [name] is a 89 y.o. female seen at the request of Dr. Truett Perna for a diagnosis of pancreatic cancer. The patient was originally diagnosed in July 2024 after CT imaging showed a mass in the head of the pancreas.  Biopsies at the level of the duodenal stricture endoscopically on 07/08/2023 showed poorly differentiated adenocarcinoma.  By imaging her tumor has been staged as a T3 N0.  She was felt to have indeterminant nodules by CT of the chest. Endoscopic intervention included a duodenal stent placed by Dr. Ewing Schlein on 07/10/23.  She began Gemzar/Abraxane on 08/05/2023.  She received 6 cycles and she was then reimaged on 10/09/2023 and the tumor was felt to have reduced in size with a ductal caliber substantially increased.  Stable lower lobe nodules bilaterally were noted in the lungs the largest being 6 mm in greatest dimension in the right lower lobe. She required biliary drain placement on 11/04/2023 after ERCP could not be performed; with internalization of the drain on 11/06/2023 but this led to flushing on 11/12/23, and the drain was left to straight drain.  It was felt that she was stable to consider surgical intervention though her procedure on 11/17/2023 was aborted due to bulky tumor of the pancreatic head involving the transverse mesocolon and involvement of the distal SMV. She did have cholecystectomy though and palliative biliary bypass over the existing transhepatic biliary drain. Her case was discussed on 12/03/2023, and she is seen to consider options of local therapy.  Given the bowel involvement the discussion was to consider  chemoradiation.    PREVIOUS RADIATION THERAPY: No   PAST MEDICAL HISTORY:  Past Medical History:  Diagnosis Date   Arthritis    Dysrhythmia    Family history of melanoma    Family history of pancreatic cancer    Family history of uterine cancer    GERD (gastroesophageal reflux disease)    Hyperlipidemia    Hypertension    pancreatic ca 06/2023   new dx   Vitamin D deficiency        PAST SURGICAL HISTORY: Past Surgical History:  Procedure Laterality Date   BIOPSY  07/08/2023   Procedure: BIOPSY;  Surgeon: Vida Rigger, MD;  Location: [facility];  Service: Gastroenterology;;   CATARACT EXTRACTION Bilateral    CHOLECYSTECTOMY  11/17/2023   Procedure: OPEN CHOLECYSTECTOMY;  Surgeon: [name], MD;  Location: [facility] OR;  Service: General;;   DUODENAL STENT PLACEMENT N/A 07/10/2023   Procedure: DUODENAL STENT PLACEMENT;  Surgeon: Vida Rigger, MD;  Location: [facility] ENDOSCOPY;  Service: Gastroenterology;  Laterality: N/A;   ERCP Bilateral 11/04/2023   Procedure: ENDOSCOPIC RETROGRADE CHOLANGIOPANCREATOGRAPHY (ERCP);  Surgeon: Vida Rigger, MD;  Location: [facility] ENDOSCOPY;  Service: Gastroenterology;  Laterality: Bilateral;   ESOPHAGOGASTRODUODENOSCOPY (EGD) WITH PROPOFOL N/A 07/08/2023   Procedure: ESOPHAGOGASTRODUODENOSCOPY (EGD) WITH PROPOFOL;  Surgeon: Vida Rigger, MD;  Location: [facility] ENDOSCOPY;  Service: Gastroenterology;  Laterality: N/A;   ESOPHAGOGASTRODUODENOSCOPY (EGD) WITH PROPOFOL N/A 07/10/2023   Procedure: ESOPHAGOGASTRODUODENOSCOPY (EGD) WITH PROPOFOL;  Surgeon: Vida Rigger, MD;  Location: [facility] ENDOSCOPY;  Service: Gastroenterology;  Laterality: N/A;  GASTRIC ROUX-EN-Y  11/17/2023   Procedure: OPEN ROUX-EN-Y HEPATIC-JEJUNOSTOMY;  Surgeon: [name], MD;  Location: [facility] OR;  Service: General;;   IR BILIARY DRAIN PLACEMENT WITH CHOLANGIOGRAM  11/04/2023   IR CHOLANGIOGRAM EXISTING TUBE  11/12/2023   IR CONVERT BILIARY DRAIN TO INT EXT BILIARY DRAIN  11/06/2023   PORTACATH  PLACEMENT N/A 08/04/2023   Procedure: INSERTION PORT-A-CATH;  Surgeon: [name], MD;  Location: [facility] ORS;  Service: General;  Laterality: N/A;   WHIPPLE PROCEDURE N/A 11/17/2023   Procedure: EXPLORATORY LAPAROTOMY;  Surgeon: [name], MD;  Location: [facility] OR;  Service: General;  Laterality: N/A;     FAMILY HISTORY:  Family History  Problem Relation Age of Onset   Uterine cancer Mother        dx in her 67s   Pancreatic cancer Father    Cerebral aneurysm Sister    Melanoma Sister 11 - 80       on her leg   Sarcoidosis Sister 75   Lung cancer Brother        smoker   Pancreatic cancer Niece 25 - 25       paternal half brother's daughter   Lung cancer Niece 60 - 39       smoker     SOCIAL HISTORY:  reports that she has never smoked. She has never used smokeless tobacco. She reports that she does not drink alcohol and does not use drugs. The patient is married and lives in [place]. She's accompanied by her son and daughter.   ALLERGIES: Bactrim [sulfamethoxazole-trimethoprim] and Protonix [pantoprazole]   MEDICATIONS:  Current Outpatient Medications  Medication Sig Dispense Refill   acetaminophen (TYLENOL) 500 MG tablet Take 1,000 mg by mouth every 6 (six) hours as needed for moderate pain (pain score 4-6) (ususally at night).     benazepril (LOTENSIN) 20 MG tablet Take 20 mg by mouth daily.     capecitabine (XELODA) 500 MG tablet Take 2 tablets (1,000 mg total) by mouth 2 (two) times daily after a meal. Take Monday-Friday. Take only on days of radiation. 112 tablet 0   cyanocobalamin (VITAMIN B12) 1000 MCG tablet Take 1,000 mcg by mouth in the morning.     docusate sodium (COLACE) 100 MG capsule Take 1 capsule (100 mg total) by mouth 2 (two) times daily. 10 capsule 0   famotidine (PEPCID) 40 MG tablet Take 40 mg by mouth daily.     ferrous sulfate 325 (65 FE) MG EC tablet Take 1 tablet (325 mg total) by mouth daily with breakfast. 30 tablet 1   lidocaine-prilocaine  (EMLA) cream APPLY 1 APPLICATION TOPICALLY AS NEEDED **APPLY TO PORT 1-2 HOURS PRIOR TO APPOINTMENT 30 g 1   methocarbamol (ROBAXIN) 500 MG tablet Take 1 tablet (500 mg total) by mouth 3 (three) times daily as needed for muscle spasms. 30 tablet 0   Multiple Vitamin (MULTIVITAMIN WITH MINERALS) TABS tablet Take 1 tablet by mouth daily. 30 tablet 0   oxyCODONE (OXY IR/ROXICODONE) 5 MG immediate release tablet Take 1-2 tablets (5-10 mg total) by mouth every 6 (six) hours as needed for severe pain (pain score 7-10). 30 tablet 0   polyethylene glycol (MIRALAX / GLYCOLAX) 17 g packet Take 17 g by mouth daily as needed for mild constipation. 14 each 0   simvastatin (ZOCOR) 40 MG tablet Take 20 mg by mouth daily with supper. Take 1/2 pill.     sodium chloride flush (NS) 0.9 % SOLN 10 mLs by  Intracatheter route daily. Flush abdominal drain with 10mL saline once a day. 300 mL 1   ondansetron (ZOFRAN) 4 MG tablet Take 1 tablet (4 mg total) by mouth every 6 (six) hours as needed for nausea. (Patient not taking: Reported on 11/25/2023) 20 tablet 0   prochlorperazine (COMPAZINE) 5 MG tablet Take 1 tablet (5 mg total) by mouth every 6 (six) hours as needed for nausea or vomiting. (Patient not taking: Reported on 11/25/2023) 30 tablet 2   No current facility-administered medications for this encounter.   Facility-Administered Medications Ordered in Other Encounters  Medication Dose Route Frequency Provider Last Rate Last Admin   sodium chloride flush (NS) 0.9 % injection 10 mL  10 mL Intravenous PRN Ladene Artist, MD   10 mL at 10/28/23 0924   sodium chloride flush (NS) 0.9 % injection 10 mL  10 mL Intravenous PRN Ladene Artist, MD   10 mL at 12/09/23 1156     REVIEW OF SYSTEMS: On review of systems, the patient reports that she is doing pretty well overall. She denies any nausea or vomiting, and has some discomfort in her abdominal incision site as well as some low back pain that she is using oxycodone  for. She has some constipation from this, and is trying to increase her oral intake, but continues to have difficulty with slowly losing weight. No other complaints are verbalized.      PHYSICAL EXAM:  Wt Readings from Last 3 Encounters:  12/16/23 114 lb 2 oz (51.8 kg)  12/09/23 116 lb 3.2 oz (52.7 kg)  11/25/23 124 lb 4.8 oz (56.4 kg)   Temp Readings from Last 3 Encounters:  12/16/23 (!) 97.1 F (36.2 C) (Temporal)  12/09/23 97.9 F (36.6 C) (Oral)  11/25/23 98.1 F (36.7 C) (Temporal)   BP Readings from Last 3 Encounters:  12/16/23 (!) 146/80  12/09/23 130/66  11/25/23 119/69   Pulse Readings from Last 3 Encounters:  12/16/23 96  12/09/23 91  11/25/23 86   Pain Assessment Pain Score: 0-No pain/10  In general this is a thin appearing caucasian female in no acute distress. She's alert and oriented x4 and appropriate throughout the examination. Cardiopulmonary assessment is negative for acute distress and she exhibits normal effort.     ECOG = 1  0 - Asymptomatic (Fully active, able to carry on all predisease activities without restriction)  1 - Symptomatic but completely ambulatory (Restricted in physically strenuous activity but ambulatory and able to carry out work of a light or sedentary nature. For example, light housework, office work)  2 - Symptomatic, <50% in bed during the day (Ambulatory and capable of all self care but unable to carry out any work activities. Up and about more than 50% of waking hours)  3 - Symptomatic, >50% in bed, but not bedbound (Capable of only limited self-care, confined to bed or chair 50% or more of waking hours)  4 - Bedbound (Completely disabled. Cannot carry on any self-care. Totally confined to bed or chair)  5 - Death   Santiago Glad MM, Creech RH, Tormey DC, et al. [phone]). "Toxicity and response criteria of the [facility]". Am. Evlyn Clines. Oncol. 5 (6): 649-55    LABORATORY DATA:  Lab Results  Component Value  Date   WBC 5.9 12/09/2023   HGB 9.7 (L) 12/09/2023   HCT 30.8 (L) 12/09/2023   MCV 91.4 12/09/2023   PLT 323 12/09/2023   Lab Results  Component Value Date  NA 133 (L) 12/09/2023   K 4.5 12/09/2023   CL 100 12/09/2023   CO2 26 12/09/2023   Lab Results  Component Value Date   ALT 11 12/09/2023   AST 17 12/09/2023   ALKPHOS 113 12/09/2023   BILITOT 0.6 12/09/2023      RADIOGRAPHY: No results found.     IMPRESSION/PLAN: 1. Stage IIA, cT3N0M0, unresectable adenocarcinoma of the pancreatic head with invasion into the meosocolon. Dr. Mitzi Hansen discusses the pathology findings and reviews the nature of locally advanced carcinoma of the pancreas. We discussed the risks, benefits, short, and long term effects of radiotherapy, as well as the curative intent, and the patient is interested in proceeding. Dr. Mitzi Hansen discusses the delivery and logistics of radiotherapy and anticipates a course of 5 1/2 weeks of radiotherapy to the pancreatic tumor. Written consent is obtained and placed in the chart, a copy was provided to the patient. The patient will be contacted to coordinate treatment planning by our simulation department. We anticipate starting therapy on 12/29/23.   In a visit lasting 60 minutes, greater than 50% of the time was spent face to face discussing the patient's condition, in preparation for the discussion, and coordinating the patient's care.   The above documentation reflects my direct findings during this shared patient visit. Please see the separate note by Dr. Mitzi Hansen on this date for the remainder of the patient's plan of care.    Osker Mason, [facility]   **Disclaimer: This note was dictated with voice recognition software. Similar sounding words can inadvertently be transcribed and this note may contain transcription errors which may not have been corrected upon publication of note.**

## 2023-12-15 NOTE — Progress Notes (Signed)
GI Location of Tumor / Histology: Pancreas- Head  Wendy [name] was diagnosed in July 2024.   Biopsies 07/08/2023   Past/Anticipated interventions by surgeon, if any:  Dr. [name] -Biliary Drain placed -Cholecystectomy, choledochojejunostomy 11/17/2023   Past/Anticipated interventions by medical oncology, if any:  Lonna Cobb NP / Dr. Truett Perna 12/09/2023 -Chemo Gemcitabine/Abraxane 8/6-10/14/2024 -Her case was presented at a recent GI tumor conference. Recommendation is to proceed with capecitabine/radiation.  She and her daughter are in agreement   -Anticipate start around 12/29/2023.   Weight changes, if any: She reports slowly losing weight.  She is eating well.  She is active at home.  She is trying to change her diet to help increase her calorie intake.  She has a duodenum stent that limits the types of foods she can eat.   Bowel/Bladder complaints, if any: She reports a little constipation, taking miralax as needed.  She reports increased urination due to hydration.  Nausea / Vomiting, if any: No  Pain issues, if any: She reports she has some lower back pain, taking oxycodone at bedtime PRN.  She has continued incisional pain.     SAFETY ISSUES: Prior radiation? No Pacemaker/ICD? No Possible current pregnancy? Postmenopausal Is the patient on methotrexate? No  Current Complaints/Details: [employer]

## 2023-12-16 ENCOUNTER — Ambulatory Visit
Admission: RE | Admit: 2023-12-16 | Discharge: 2023-12-16 | Disposition: A | Discharge status: Home / Self Care | Payer: Medicare Other | Source: Ambulatory Visit | Attending: Radiation Oncology | Admitting: Radiation Oncology

## 2023-12-16 ENCOUNTER — Encounter: Payer: Self-pay | Admitting: Radiation Oncology

## 2023-12-16 ENCOUNTER — Other Ambulatory Visit: Payer: Self-pay

## 2023-12-16 ENCOUNTER — Other Ambulatory Visit (HOSPITAL_COMMUNITY): Payer: Self-pay

## 2023-12-16 VITALS — BP 146/80 | HR 96 | Temp 97.1°F | Resp 18 | Ht 66.0 in | Wt 114.1 lb

## 2023-12-16 DIAGNOSIS — E559 Vitamin D deficiency, unspecified: Secondary | ICD-10-CM | POA: Diagnosis not present

## 2023-12-16 DIAGNOSIS — R918 Other nonspecific abnormal finding of lung field: Secondary | ICD-10-CM | POA: Diagnosis not present

## 2023-12-16 DIAGNOSIS — E785 Hyperlipidemia, unspecified: Secondary | ICD-10-CM | POA: Diagnosis not present

## 2023-12-16 DIAGNOSIS — Z79899 Other long term (current) drug therapy: Secondary | ICD-10-CM | POA: Insufficient documentation

## 2023-12-16 DIAGNOSIS — Z808 Family history of malignant neoplasm of other organs or systems: Secondary | ICD-10-CM | POA: Diagnosis not present

## 2023-12-16 DIAGNOSIS — C250 Malignant neoplasm of head of pancreas: Secondary | ICD-10-CM

## 2023-12-16 DIAGNOSIS — Z801 Family history of malignant neoplasm of trachea, bronchus and lung: Secondary | ICD-10-CM | POA: Insufficient documentation

## 2023-12-16 DIAGNOSIS — I10 Essential (primary) hypertension: Secondary | ICD-10-CM | POA: Insufficient documentation

## 2023-12-16 DIAGNOSIS — M129 Arthropathy, unspecified: Secondary | ICD-10-CM | POA: Insufficient documentation

## 2023-12-16 DIAGNOSIS — Z800 Family history of malignant neoplasm of digestive organs: Secondary | ICD-10-CM | POA: Diagnosis not present

## 2023-12-16 DIAGNOSIS — K219 Gastro-esophageal reflux disease without esophagitis: Secondary | ICD-10-CM | POA: Diagnosis not present

## 2023-12-16 NOTE — Telephone Encounter (Signed)
Patient successfully OnBoarded and drug education provided by pharmacist. Medication scheduled to be picked up on Thursday, 12/18/23, from [facility]. Patient also knows to call me at [phone] with any questions or concerns regarding receiving medication or if there is any unexpected change in co-pay.    Ardeen Fillers, CPhT Oncology Pharmacy Patient Advocate  [facility]  [phone] (phone) [phone] (fax) 12/16/2023 1:40 PM

## 2023-12-16 NOTE — Telephone Encounter (Signed)
Clinical Pharmacist Practitioner Encounter   Wendy [name] knows her mom is not to start until her first day of radiation therapy.   Patient Education I spoke with patient's daughter Wendy [name] for overview of new oral chemotherapy medication: Xeloda (capecitabine) for the treatment of stage IIa pancreatic cancer in conjunction with radiation, planned duration until the end of radiation. Planned start 12/29/23 along with the start of radiation.   Counseled Wendy [name] on administration, dosing, side effects, monitoring, drug-food interactions, safe handling, storage, and disposal. Patient will take 2 tablets (1,000 mg total) by mouth 2 (two) times daily after a meal. Take Monday-Friday. Take only on days of radiation.   Side effects include but not limited to: diarrhea, hand-foot syndrome, mouth sores, edema, decreased wbc, fatigue, N/V Diarrhea: Patient knows to use loperamide as needed and call the office if they are having 4 or more loose stools per day. Hand-foot syndrome: Recommended the use of [place] Smooth Extra Care 20. Patient knows to report any skin changes they notice.  Mouth sores: Patient knows to request magic mouthwash if needed    Reviewed with Wendy [name] importance of keeping a medication schedule and plan for any missed doses.  After discussion with Wendy [name] no patient barriers to medication adherence identified.   Wendy [name]  voiced understanding and appreciation. All questions answered. Medication handout provided.  Provided Wendy [name] with [facility] phone number. Wendy [name] knows to call the office with questions or concerns. [facility] will continue to follow.  Remi Haggard, PharmD, BCPS, BCOP, CPP Hematology/Oncology Clinical Pharmacist Practitioner [facility] [phone]  12/16/2023 12:25 PM

## 2023-12-16 NOTE — Progress Notes (Signed)
Patient education documented in EPIC note on 12/16/23.

## 2023-12-16 NOTE — Progress Notes (Signed)
Specialty Pharmacy Initial Fill Coordination Note  Wendy [name] is a 89 y.o. female contacted today regarding initial fill of specialty medication(s) Capecitabine (XELODA)  Patient requested Wendy [name] at [facility] [place] date: 12/18/23  Medication will be filled on 12/17/23.   Patient is aware of $22.72 copayment.    Wendy [name], CPhT Oncology Pharmacy Patient Advocate  [facility]  [phone] (phone) [phone] (fax) 12/16/2023 12:34 PM

## 2023-12-17 ENCOUNTER — Ambulatory Visit
Admission: RE | Admit: 2023-12-17 | Discharge: 2023-12-17 | Disposition: A | Discharge status: Home / Self Care | Payer: Medicare Other | Source: Ambulatory Visit | Attending: Radiation Oncology | Admitting: Radiation Oncology

## 2023-12-17 ENCOUNTER — Other Ambulatory Visit (HOSPITAL_COMMUNITY): Payer: Self-pay

## 2023-12-17 ENCOUNTER — Other Ambulatory Visit: Payer: Self-pay

## 2023-12-17 DIAGNOSIS — C250 Malignant neoplasm of head of pancreas: Secondary | ICD-10-CM | POA: Insufficient documentation

## 2023-12-17 DIAGNOSIS — Z510 Encounter for antineoplastic radiation therapy: Secondary | ICD-10-CM | POA: Diagnosis not present

## 2023-12-18 ENCOUNTER — Other Ambulatory Visit: Payer: Self-pay

## 2023-12-18 ENCOUNTER — Other Ambulatory Visit (HOSPITAL_COMMUNITY): Payer: Self-pay

## 2023-12-22 ENCOUNTER — Telehealth: Payer: Self-pay

## 2023-12-22 NOTE — Telephone Encounter (Signed)
The patient's daughter called to request a refill of her oxycodone prescription. I have placed the request on the Nurse Practitioner's desk for review.

## 2023-12-23 ENCOUNTER — Ambulatory Visit: Payer: Medicare Other

## 2023-12-23 ENCOUNTER — Other Ambulatory Visit: Payer: Medicare Other

## 2023-12-23 ENCOUNTER — Other Ambulatory Visit: Payer: Self-pay | Admitting: Nurse Practitioner

## 2023-12-23 ENCOUNTER — Ambulatory Visit: Payer: Medicare Other | Admitting: Oncology

## 2023-12-23 DIAGNOSIS — C250 Malignant neoplasm of head of pancreas: Secondary | ICD-10-CM

## 2023-12-23 MED ORDER — OXYCODONE HCL 5 MG PO TABS
5.0000 mg | ORAL_TABLET | Freq: Four times a day (QID) | ORAL | 0 refills | Status: DC | PRN
Start: 2023-12-23 — End: 2024-01-13

## 2023-12-25 ENCOUNTER — Telehealth: Payer: Self-pay | Admitting: *Deleted

## 2023-12-25 DIAGNOSIS — C250 Malignant neoplasm of head of pancreas: Secondary | ICD-10-CM | POA: Diagnosis not present

## 2023-12-25 DIAGNOSIS — Z510 Encounter for antineoplastic radiation therapy: Secondary | ICD-10-CM | POA: Diagnosis not present

## 2023-12-25 NOTE — Telephone Encounter (Signed)
Daughter called for oxycodone refill and asking if still OK to eat folic acid in her foods (MD said to stop MVI)? Informed her that oxycodone was sent to [facility] on 12/24 and is ready to pick up. OK to eat folic acid in foods since not concentrated.

## 2023-12-29 ENCOUNTER — Other Ambulatory Visit: Payer: Self-pay

## 2023-12-29 ENCOUNTER — Ambulatory Visit
Admission: RE | Admit: 2023-12-29 | Discharge: 2023-12-29 | Disposition: A | Discharge status: Home / Self Care | Payer: Medicare Other | Source: Ambulatory Visit | Attending: Radiation Oncology | Admitting: Radiation Oncology

## 2023-12-29 DIAGNOSIS — Z510 Encounter for antineoplastic radiation therapy: Secondary | ICD-10-CM | POA: Diagnosis not present

## 2023-12-29 DIAGNOSIS — C250 Malignant neoplasm of head of pancreas: Secondary | ICD-10-CM | POA: Diagnosis not present

## 2023-12-29 LAB — RAD ONC ARIA SESSION SUMMARY
Course Elapsed Days: 0
Plan Fractions Treated to Date: 1
Plan Prescribed Dose Per Fraction: 1.8 Gy
Plan Total Fractions Prescribed: 25
Plan Total Prescribed Dose: 45 Gy
Reference Point Dosage Given to Date: 1.8 Gy
Reference Point Session Dosage Given: 1.8 Gy
Session Number: 1

## 2023-12-30 ENCOUNTER — Ambulatory Visit
Admission: RE | Admit: 2023-12-30 | Discharge: 2023-12-30 | Disposition: A | Discharge status: Home / Self Care | Payer: Medicare Other | Source: Ambulatory Visit | Attending: Radiation Oncology | Admitting: Radiation Oncology

## 2023-12-30 ENCOUNTER — Other Ambulatory Visit: Payer: Self-pay

## 2023-12-30 DIAGNOSIS — Z510 Encounter for antineoplastic radiation therapy: Secondary | ICD-10-CM | POA: Diagnosis not present

## 2023-12-30 DIAGNOSIS — C250 Malignant neoplasm of head of pancreas: Secondary | ICD-10-CM | POA: Diagnosis not present

## 2023-12-30 LAB — RAD ONC ARIA SESSION SUMMARY
Course Elapsed Days: 1
Plan Fractions Treated to Date: 2
Plan Prescribed Dose Per Fraction: 1.8 Gy
Plan Total Fractions Prescribed: 25
Plan Total Prescribed Dose: 45 Gy
Reference Point Dosage Given to Date: 3.6 Gy
Reference Point Session Dosage Given: 1.8 Gy
Session Number: 2

## 2024-01-01 ENCOUNTER — Other Ambulatory Visit: Payer: Self-pay

## 2024-01-01 ENCOUNTER — Ambulatory Visit
Admission: RE | Admit: 2024-01-01 | Discharge: 2024-01-01 | Disposition: A | Discharge status: Home / Self Care | Payer: Medicare Other | Source: Ambulatory Visit | Attending: Radiation Oncology | Admitting: Radiation Oncology

## 2024-01-01 DIAGNOSIS — C250 Malignant neoplasm of head of pancreas: Secondary | ICD-10-CM | POA: Insufficient documentation

## 2024-01-01 DIAGNOSIS — Z510 Encounter for antineoplastic radiation therapy: Secondary | ICD-10-CM | POA: Insufficient documentation

## 2024-01-01 LAB — RAD ONC ARIA SESSION SUMMARY
Course Elapsed Days: 3
Plan Fractions Treated to Date: 3
Plan Prescribed Dose Per Fraction: 1.8 Gy
Plan Total Fractions Prescribed: 25
Plan Total Prescribed Dose: 45 Gy
Reference Point Dosage Given to Date: 5.4 Gy
Reference Point Session Dosage Given: 1.8 Gy
Session Number: 3

## 2024-01-02 ENCOUNTER — Ambulatory Visit
Admission: RE | Admit: 2024-01-02 | Discharge: 2024-01-02 | Disposition: A | Discharge status: Home / Self Care | Payer: Medicare Other | Source: Ambulatory Visit | Attending: Radiation Oncology

## 2024-01-02 ENCOUNTER — Other Ambulatory Visit: Payer: Self-pay

## 2024-01-02 ENCOUNTER — Ambulatory Visit
Admission: RE | Admit: 2024-01-02 | Discharge: 2024-01-02 | Disposition: A | Discharge status: Home / Self Care | Payer: Medicare Other | Source: Ambulatory Visit | Attending: Radiation Oncology | Admitting: Radiation Oncology

## 2024-01-02 ENCOUNTER — Inpatient Hospital Stay: Payer: Medicare Other | Admitting: Dietician

## 2024-01-02 DIAGNOSIS — Z7901 Long term (current) use of anticoagulants: Secondary | ICD-10-CM | POA: Insufficient documentation

## 2024-01-02 DIAGNOSIS — E538 Deficiency of other specified B group vitamins: Secondary | ICD-10-CM | POA: Insufficient documentation

## 2024-01-02 DIAGNOSIS — E611 Iron deficiency: Secondary | ICD-10-CM | POA: Insufficient documentation

## 2024-01-02 DIAGNOSIS — D649 Anemia, unspecified: Secondary | ICD-10-CM | POA: Insufficient documentation

## 2024-01-02 DIAGNOSIS — M549 Dorsalgia, unspecified: Secondary | ICD-10-CM | POA: Insufficient documentation

## 2024-01-02 DIAGNOSIS — R112 Nausea with vomiting, unspecified: Secondary | ICD-10-CM | POA: Insufficient documentation

## 2024-01-02 DIAGNOSIS — I4891 Unspecified atrial fibrillation: Secondary | ICD-10-CM | POA: Insufficient documentation

## 2024-01-02 DIAGNOSIS — C250 Malignant neoplasm of head of pancreas: Secondary | ICD-10-CM | POA: Insufficient documentation

## 2024-01-02 DIAGNOSIS — Z510 Encounter for antineoplastic radiation therapy: Secondary | ICD-10-CM | POA: Diagnosis not present

## 2024-01-02 LAB — RAD ONC ARIA SESSION SUMMARY
Course Elapsed Days: 4
Plan Fractions Treated to Date: 4
Plan Prescribed Dose Per Fraction: 1.8 Gy
Plan Total Fractions Prescribed: 25
Plan Total Prescribed Dose: 45 Gy
Reference Point Dosage Given to Date: 7.2 Gy
Reference Point Session Dosage Given: 1.8 Gy
Session Number: 4

## 2024-01-02 NOTE — Progress Notes (Signed)
 Nutrition Assessment   Reason for Assessment: Referral (wt loss)   ASSESSMENT: 89 year old female with stage III pancreatic cancer. Patient completed 5 cycles gem/abraxane  under the care of Dr. Cloretta on 09/30/23. S/p aborted Whipple (11/18) secondary to involvement of SMV and transverse colon. She is currently receiving oral chemo with capecitabine  concurrent with radiation.   7/11 - gastric outlet obstruction s/p duodenal stent  Past medical history includes HTN, atrial fibrillation, GERD, HLD, vit D deficiency, anemia  Met with patient in office prior to radiation. Daughter and son are present at visit. Patient reports altered taste. Sweet foods are what taste good/normal, however reports being instructed by MD to not have sugar due to pancreatic cancer. Patient is also unable to eat high fiber foods secondary to duodenal stent. Daughter reports patient consuming 2000-2500 calories, however pt is not gaining weight. Patient is drinking one Boost and 1% milk with whey powder (24 g/scoop). Yesterday pt had blueberry muffin with applesauce and protein drink for breakfast, protein bar (190 kcal, 16 g) with Boost for lunch. She ate double beef and cheese taco for dinner. Patient reports being stuffed at the end of the day. Food sometimes comes back up. She did have one episode of vomiting last night.    Nutrition Focused Physical Exam: deferred    Medications: lotensin , B12, colace, pepcid , ferrous sulfate , robaxin , zofran , roxicodone , miralax , compazine , zocor    Labs: 12/10 - Na 133, glucose 137   Anthropometrics:   Height: 5'6 Weight: 114 lb 2 oz (12/17) UBW: 126 lb (July) BMI: 18.42   NUTRITION DIAGNOSIS: Unintended wt loss related to cancer as evidenced by ~10% wt loss in 5 months which is severe for time frame   INTERVENTION:  Educated on small frequent meals/snacks with high calorie high protein foods - snack ideas + soft moist high protein foods provided  Recommend  consuming oral nutrition supplements in between meals vs with meals Educated on strategies for adding calories/protein to foods - making the most of every bite Educated on strategies for altered taste, suggested trying baking soda salt water rinses before meals - handout with tips + recipe provided  Take antiemetics as needed - recommend taking 30 minutes prior to eating  Contact information provided    MONITORING, EVALUATION, GOAL: Patient will tolerate increased calories and protein to promote wt gain   Next Visit: Friday January 10 after radiation

## 2024-01-03 ENCOUNTER — Other Ambulatory Visit: Payer: Self-pay | Admitting: Oncology

## 2024-01-05 ENCOUNTER — Ambulatory Visit
Admission: RE | Admit: 2024-01-05 | Discharge: 2024-01-05 | Disposition: A | Discharge status: Home / Self Care | Payer: Medicare Other | Source: Ambulatory Visit | Attending: Radiation Oncology | Admitting: Radiation Oncology

## 2024-01-05 ENCOUNTER — Encounter: Payer: Self-pay | Admitting: Cardiology

## 2024-01-05 ENCOUNTER — Other Ambulatory Visit: Payer: Self-pay

## 2024-01-05 DIAGNOSIS — C250 Malignant neoplasm of head of pancreas: Secondary | ICD-10-CM | POA: Diagnosis not present

## 2024-01-05 DIAGNOSIS — Z510 Encounter for antineoplastic radiation therapy: Secondary | ICD-10-CM | POA: Diagnosis not present

## 2024-01-05 LAB — RAD ONC ARIA SESSION SUMMARY
Course Elapsed Days: 7
Plan Fractions Treated to Date: 5
Plan Prescribed Dose Per Fraction: 1.8 Gy
Plan Total Fractions Prescribed: 25
Plan Total Prescribed Dose: 45 Gy
Reference Point Dosage Given to Date: 9 Gy
Reference Point Session Dosage Given: 1.8 Gy
Session Number: 5

## 2024-01-05 MED ORDER — BENAZEPRIL HCL 20 MG PO TABS: 20.0000 mg | ORAL_TABLET | Freq: Every day | ORAL | 3 refills | Status: DC

## 2024-01-05 MED ORDER — SIMVASTATIN 40 MG PO TABS: 20.0000 mg | ORAL_TABLET | Freq: Every day | ORAL | 3 refills | Status: DC

## 2024-01-06 ENCOUNTER — Other Ambulatory Visit: Payer: Self-pay

## 2024-01-06 ENCOUNTER — Ambulatory Visit: Payer: Medicare Other

## 2024-01-06 ENCOUNTER — Inpatient Hospital Stay: Payer: Medicare Other

## 2024-01-06 ENCOUNTER — Ambulatory Visit
Admission: RE | Admit: 2024-01-06 | Discharge: 2024-01-06 | Disposition: A | Discharge status: Home / Self Care | Payer: Medicare Other | Source: Ambulatory Visit | Attending: Radiation Oncology | Admitting: Radiation Oncology

## 2024-01-06 ENCOUNTER — Inpatient Hospital Stay (HOSPITAL_BASED_OUTPATIENT_CLINIC_OR_DEPARTMENT_OTHER): Payer: Medicare Other | Admitting: Nurse Practitioner

## 2024-01-06 ENCOUNTER — Encounter: Payer: Self-pay | Admitting: Nurse Practitioner

## 2024-01-06 VITALS — BP 119/66 | HR 100 | Temp 98.1°F | Resp 18 | Ht 66.0 in | Wt 114.9 lb

## 2024-01-06 DIAGNOSIS — C250 Malignant neoplasm of head of pancreas: Secondary | ICD-10-CM

## 2024-01-06 DIAGNOSIS — Z95828 Presence of other vascular implants and grafts: Secondary | ICD-10-CM

## 2024-01-06 DIAGNOSIS — D649 Anemia, unspecified: Secondary | ICD-10-CM

## 2024-01-06 DIAGNOSIS — Z510 Encounter for antineoplastic radiation therapy: Secondary | ICD-10-CM | POA: Diagnosis not present

## 2024-01-06 LAB — CBC WITH DIFFERENTIAL (CANCER CENTER ONLY)
Abs Immature Granulocytes: 0.05 10*3/uL (ref 0.00–0.07)
Basophils Absolute: 0 10*3/uL (ref 0.0–0.1)
Basophils Relative: 1 %
Eosinophils Absolute: 0.2 10*3/uL (ref 0.0–0.5)
Eosinophils Relative: 3 %
HCT: 29.5 % — ABNORMAL LOW (ref 36.0–46.0)
Hemoglobin: 9.7 g/dL — ABNORMAL LOW (ref 12.0–15.0)
Immature Granulocytes: 1 %
Lymphocytes Relative: 8 %
Lymphs Abs: 0.4 10*3/uL — ABNORMAL LOW (ref 0.7–4.0)
MCH: 28.6 pg (ref 26.0–34.0)
MCHC: 32.9 g/dL (ref 30.0–36.0)
MCV: 87 fL (ref 80.0–100.0)
Monocytes Absolute: 0.5 10*3/uL (ref 0.1–1.0)
Monocytes Relative: 9 %
Neutro Abs: 4.3 10*3/uL (ref 1.7–7.7)
Neutrophils Relative %: 78 %
Platelet Count: 306 10*3/uL (ref 150–400)
RBC: 3.39 MIL/uL — ABNORMAL LOW (ref 3.87–5.11)
RDW: 14.4 % (ref 11.5–15.5)
WBC Count: 5.5 10*3/uL (ref 4.0–10.5)
nRBC: 0 % (ref 0.0–0.2)

## 2024-01-06 LAB — CMP (CANCER CENTER ONLY)
ALT: 13 U/L (ref 0–44)
AST: 17 U/L (ref 15–41)
Albumin: 3.7 g/dL (ref 3.5–5.0)
Alkaline Phosphatase: 109 U/L (ref 38–126)
Anion gap: 8 (ref 5–15)
BUN: 19 mg/dL (ref 8–23)
CO2: 22 mmol/L (ref 22–32)
Calcium: 9.3 mg/dL (ref 8.9–10.3)
Chloride: 101 mmol/L (ref 98–111)
Creatinine: 0.36 mg/dL — ABNORMAL LOW (ref 0.44–1.00)
GFR, Estimated: >60 mL/min (ref >60)
Glucose, Bld: 150 mg/dL — ABNORMAL HIGH (ref 70–99)
Potassium: 3.9 mmol/L (ref 3.5–5.1)
Sodium: 131 mmol/L — ABNORMAL LOW (ref 135–145)
Total Bilirubin: 0.5 mg/dL (ref 0.0–1.2)
Total Protein: 6.3 g/dL — ABNORMAL LOW (ref 6.5–8.1)

## 2024-01-06 LAB — RAD ONC ARIA SESSION SUMMARY
Course Elapsed Days: 8
Plan Fractions Treated to Date: 6
Plan Prescribed Dose Per Fraction: 1.8 Gy
Plan Total Fractions Prescribed: 25
Plan Total Prescribed Dose: 45 Gy
Reference Point Dosage Given to Date: 10.8 Gy
Reference Point Session Dosage Given: 1.8 Gy
Session Number: 6

## 2024-01-06 MED ORDER — HEPARIN SOD (PORK) LOCK FLUSH 100 UNIT/ML IV SOLN
500.0000 [IU] | Freq: Once | INTRAVENOUS | Status: AC | PRN
  Administered 2024-01-06: 500 [IU]

## 2024-01-06 MED ORDER — TRAMADOL HCL 50 MG PO TABS: 50.0000 mg | ORAL_TABLET | Freq: Two times a day (BID) | ORAL | 0 refills | Status: DC | PRN

## 2024-01-06 MED ORDER — HEPARIN SOD (PORK) LOCK FLUSH 100 UNIT/ML IV SOLN
500.0000 [IU] | Freq: Once | INTRAVENOUS | Status: AC
  Administered 2024-01-06: 500 [IU] via INTRAVENOUS

## 2024-01-06 MED ORDER — SODIUM CHLORIDE 0.9% FLUSH
10.0000 mL | INTRAVENOUS | Status: DC | PRN
Start: 2024-01-06 — End: 2024-01-06
  Administered 2024-01-06: 10 mL

## 2024-01-06 MED ORDER — SODIUM CHLORIDE 0.9% FLUSH
10.0000 mL | INTRAVENOUS | Status: DC | PRN
  Administered 2024-01-06: 10 mL via INTRAVENOUS

## 2024-01-06 NOTE — Addendum Note (Signed)
 Addended by: Dimitri Ped on: 01/06/2024 12:31 PM   Modules accepted: Orders

## 2024-01-06 NOTE — Progress Notes (Signed)
 [facility] OFFICE PROGRESS NOTE   Diagnosis: Pancreas cancer  INTERVAL HISTORY:   Wendy [name] returns as scheduled.  She began radiation and capecitabine  12/29/2023.  She has intermittent nausea, single episode of vomiting.  No mouth sores.  No diarrhea.  At times she has frequent formed or small stools.  No hand or foot pain or redness.  She has pain periodically at the site of a previous drain right abdomen and at the right mid back.  She continues to have low back pain.  She takes oxycodone  at nighttime.  She does not take pain medication during the day.  Objective:  Vital signs in last 24 hours:  Blood pressure 119/66, pulse 100, temperature 98.1 F (36.7 C), temperature source Temporal, resp. rate 18, height 5' 6 (1.676 m), weight 114 lb 14.4 oz (52.1 kg), SpO2 100%.    HEENT: No thrush or ulcers. Resp: Lungs clear bilaterally. Cardio: Regular rate and rhythm. GI: Abdomen soft.  Tender around a scar right abdomen and at the distal edge of the midline scar.  No hepatomegaly. Vascular: No leg edema. Skin: Palms without erythema. Port-A-Cath without erythema.  Lab Results:  Lab Results  Component Value Date   WBC 5.9 12/09/2023   HGB 9.7 (L) 12/09/2023   HCT 30.8 (L) 12/09/2023   MCV 91.4 12/09/2023   PLT 323 12/09/2023   NEUTROABS 4.2 12/09/2023    Imaging:  No results found.  Medications: I have reviewed the patient's current medications.  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  Fluid-filled and distended stomach and proximal duodenum, single 11 mm periportal lymph node EGD 07/08/2023-large food residue in the stomach, extrinsic deformity at the first  portion of the duodenum-biopsied Elevated CA 19-9 CT chest 07/11/2023-scattered small pulmonary nodules-indeterminate Cycle 1 gemcitabine /Abraxane  08/05/2023 Cycle 2 gemcitabine /Abraxane  08/19/2023 Cycle 3 gemcitabine /Abraxane  09/02/2023 Cycle 4 gemcitabine /Abraxane  09/16/2023 Cycle 5 gemcitabine /Abraxane  09/30/2023 CTs 10/09/2023-increase size of pancreas mass, increased pancreatic duct caliber and new biliary dilation, bilateral lower lobe nodules are stable, hypodense left thyroid  nodule Cycle 6 gemcitabine /Abraxane  10/13/2023 10/28/2023 chemotherapy held due to LFT abnormalities Biliary drain placed 11/04/2023; internal/external biliary drain 11/06/2023 Exploratory laparotomy, cholecystectomy, choledochojejunostomy 11/17/2023-bulky tumor of the pancreas head with involvement of the transverse mesocolon and probable involvement of the distal SMV-plan for pancreaticoduodenectomy aborted Case presented at GI tumor conference 12/03/2023-recommendation to proceed with capecitabine /radiation Referred to Dr. Dewey 12/09/2023 Radiation/capecitabine  12/29/2023   2.   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   Anemia 4.   Iron deficiency 5.   B12 deficiency 6.   Atrial fibrillation-maintained on Xarelto  prior to hospital admission 7.  Family history of pancreas and uterine cancer 8.  Hyperlipidemia 9.  Genetics-negative    Disposition: Wendy [name] appears stable.  She began radiation/capecitabine  12/29/2023.  Plan to continue the same.  CBC reviewed.  Counts adequate to continue capecitabine .  Chemistry panel is pending.  She has various sites of pain.  The back pain could be related to the pancreas cancer.  She will continue oxycodone  at nighttime as needed.  We are sending a prescription to her pharmacy for tramadol  to take during the day as needed.  She will discuss the pain near the midline incision and right abdomen drain  site with  Dr. Dasie at an upcoming appointment.  She will return for follow-up in approximately 2 weeks.  We are available to see her sooner if needed.  Patient seen with Dr. Cloretta.  Olam Ned ANP/GNP-BC   01/06/2024  11:16 AM This was a shared visit with Olam Ned.  Wendy [name] was interviewed and examined.  She appears to be tolerating the capecitabine  and radiation well.  Discomfort at the abdominal incision and right chest are likely related to recent surgery or a benign musculoskeletal condition.  She will continue capecitabine  and radiation.  I was present for greater than 50% of today's visit.  I performed medical decision making.  Arvella Cloretta, MD

## 2024-01-07 ENCOUNTER — Ambulatory Visit
Admission: RE | Admit: 2024-01-07 | Discharge: 2024-01-07 | Disposition: A | Discharge status: Home / Self Care | Payer: Medicare Other | Source: Ambulatory Visit | Attending: Radiation Oncology | Admitting: Radiation Oncology

## 2024-01-07 ENCOUNTER — Other Ambulatory Visit: Payer: Self-pay

## 2024-01-07 DIAGNOSIS — C250 Malignant neoplasm of head of pancreas: Secondary | ICD-10-CM | POA: Diagnosis not present

## 2024-01-07 DIAGNOSIS — Z510 Encounter for antineoplastic radiation therapy: Secondary | ICD-10-CM | POA: Diagnosis not present

## 2024-01-07 LAB — RAD ONC ARIA SESSION SUMMARY
Course Elapsed Days: 9
Plan Fractions Treated to Date: 7
Plan Prescribed Dose Per Fraction: 1.8 Gy
Plan Total Fractions Prescribed: 25
Plan Total Prescribed Dose: 45 Gy
Reference Point Dosage Given to Date: 12.6 Gy
Reference Point Session Dosage Given: 1.8 Gy
Session Number: 7

## 2024-01-07 LAB — CANCER ANTIGEN 19-9: CA 19-9: 206 U/mL — ABNORMAL HIGH (ref 0–35)

## 2024-01-08 ENCOUNTER — Other Ambulatory Visit: Payer: Self-pay

## 2024-01-08 ENCOUNTER — Other Ambulatory Visit (HOSPITAL_COMMUNITY): Payer: Self-pay

## 2024-01-08 ENCOUNTER — Ambulatory Visit
Admission: RE | Admit: 2024-01-08 | Discharge: 2024-01-08 | Disposition: A | Discharge status: Home / Self Care | Payer: Medicare Other | Source: Ambulatory Visit | Attending: Radiation Oncology | Admitting: Radiation Oncology

## 2024-01-08 ENCOUNTER — Encounter: Payer: Self-pay | Admitting: Oncology

## 2024-01-08 DIAGNOSIS — Z510 Encounter for antineoplastic radiation therapy: Secondary | ICD-10-CM | POA: Diagnosis not present

## 2024-01-08 DIAGNOSIS — K315 Obstruction of duodenum: Secondary | ICD-10-CM | POA: Diagnosis not present

## 2024-01-08 DIAGNOSIS — C250 Malignant neoplasm of head of pancreas: Secondary | ICD-10-CM | POA: Diagnosis not present

## 2024-01-08 LAB — RAD ONC ARIA SESSION SUMMARY
Course Elapsed Days: 10
Plan Fractions Treated to Date: 8
Plan Prescribed Dose Per Fraction: 1.8 Gy
Plan Total Fractions Prescribed: 25
Plan Total Prescribed Dose: 45 Gy
Reference Point Dosage Given to Date: 14.4 Gy
Reference Point Session Dosage Given: 1.8 Gy
Session Number: 8

## 2024-01-08 NOTE — Progress Notes (Signed)
 Specialty Pharmacy Ongoing Clinical Assessment Note  Wendy [name] is a 89 y.o. female who is being followed by the specialty pharmacy service for RxSp Oncology   Patient's specialty medication(s) reviewed today: Capecitabine  (XELODA )   Missed doses in the last 4 weeks: 0   Patient/Caregiver did not have any additional questions or concerns.   Therapeutic benefit summary: Unable to assess   Adverse events/side effects summary: Experienced adverse events/side effects (nausea treated with Compazine  & Zofran )   Patient's therapy is appropriate to: Continue (x 5 weeks total.)    Goals Addressed             This Visit's Progress    Stabilization of disease       Patient is unable to be assessed as therapy was recently initiated. Patient will be evaluated at upcoming provider appointment to assess progress         Follow up:  Patient received 5 weeks of Xeloda  and will not need a follow up from the pharmacy unless therapy changes.   Mitzie GORMAN [facility] Specialty Pharmacist

## 2024-01-08 NOTE — Progress Notes (Signed)
 Xeloda dispensing complete. Disenrolling Patient from Specialty Services.

## 2024-01-09 ENCOUNTER — Telehealth: Payer: Self-pay | Admitting: *Deleted

## 2024-01-09 ENCOUNTER — Other Ambulatory Visit: Payer: Self-pay

## 2024-01-09 ENCOUNTER — Ambulatory Visit
Admission: RE | Admit: 2024-01-09 | Discharge: 2024-01-09 | Disposition: A | Discharge status: Home / Self Care | Payer: Medicare Other | Source: Ambulatory Visit | Attending: Radiation Oncology | Admitting: Radiation Oncology

## 2024-01-09 ENCOUNTER — Inpatient Hospital Stay: Payer: Medicare Other | Admitting: Dietician

## 2024-01-09 DIAGNOSIS — C250 Malignant neoplasm of head of pancreas: Secondary | ICD-10-CM | POA: Diagnosis not present

## 2024-01-09 DIAGNOSIS — Z510 Encounter for antineoplastic radiation therapy: Secondary | ICD-10-CM | POA: Diagnosis not present

## 2024-01-09 LAB — RAD ONC ARIA SESSION SUMMARY
Course Elapsed Days: 11
Plan Fractions Treated to Date: 9
Plan Prescribed Dose Per Fraction: 1.8 Gy
Plan Total Fractions Prescribed: 25
Plan Total Prescribed Dose: 45 Gy
Reference Point Dosage Given to Date: 16.2 Gy
Reference Point Session Dosage Given: 1.8 Gy
Session Number: 9

## 2024-01-09 NOTE — Telephone Encounter (Signed)
 CALLED PATIENT TO INFORM THAT THE NUTRITIONIST HAD ME CANCEL HER APPT. WITH HER FOR TODAY, SPOKE WITH PATIENT AND SHE IS AWARE OF THE CANCELLATION

## 2024-01-12 ENCOUNTER — Telehealth: Payer: Self-pay

## 2024-01-12 ENCOUNTER — Other Ambulatory Visit: Payer: Self-pay

## 2024-01-12 ENCOUNTER — Ambulatory Visit
Admission: RE | Admit: 2024-01-12 | Discharge: 2024-01-12 | Disposition: A | Discharge status: Home / Self Care | Payer: Medicare Other | Source: Ambulatory Visit | Attending: Radiation Oncology

## 2024-01-12 DIAGNOSIS — Z510 Encounter for antineoplastic radiation therapy: Secondary | ICD-10-CM | POA: Diagnosis not present

## 2024-01-12 DIAGNOSIS — C250 Malignant neoplasm of head of pancreas: Secondary | ICD-10-CM | POA: Diagnosis not present

## 2024-01-12 LAB — RAD ONC ARIA SESSION SUMMARY
Course Elapsed Days: 14
Plan Fractions Treated to Date: 10
Plan Prescribed Dose Per Fraction: 1.8 Gy
Plan Total Fractions Prescribed: 25
Plan Total Prescribed Dose: 45 Gy
Reference Point Dosage Given to Date: 18 Gy
Reference Point Session Dosage Given: 1.8 Gy
Session Number: 10

## 2024-01-12 NOTE — Telephone Encounter (Signed)
 Patient called and request a refill of her Oxycodone,I placed the request on the Np's desk.

## 2024-01-13 ENCOUNTER — Ambulatory Visit
Admission: RE | Admit: 2024-01-13 | Discharge: 2024-01-13 | Disposition: A | Discharge status: Home / Self Care | Payer: Medicare Other | Source: Ambulatory Visit | Attending: Radiation Oncology | Admitting: Radiation Oncology

## 2024-01-13 ENCOUNTER — Other Ambulatory Visit: Payer: Self-pay

## 2024-01-13 ENCOUNTER — Other Ambulatory Visit: Payer: Self-pay | Admitting: Nurse Practitioner

## 2024-01-13 DIAGNOSIS — C250 Malignant neoplasm of head of pancreas: Secondary | ICD-10-CM

## 2024-01-13 DIAGNOSIS — Z510 Encounter for antineoplastic radiation therapy: Secondary | ICD-10-CM | POA: Diagnosis not present

## 2024-01-13 LAB — RAD ONC ARIA SESSION SUMMARY
Course Elapsed Days: 15
Plan Fractions Treated to Date: 11
Plan Prescribed Dose Per Fraction: 1.8 Gy
Plan Total Fractions Prescribed: 25
Plan Total Prescribed Dose: 45 Gy
Reference Point Dosage Given to Date: 19.8 Gy
Reference Point Session Dosage Given: 1.8 Gy
Session Number: 11

## 2024-01-13 MED ORDER — OXYCODONE HCL 5 MG PO TABS: 5.0000 mg | ORAL_TABLET | Freq: Four times a day (QID) | ORAL | 0 refills | Status: DC | PRN

## 2024-01-14 ENCOUNTER — Ambulatory Visit
Admission: RE | Admit: 2024-01-14 | Discharge: 2024-01-14 | Disposition: A | Discharge status: Home / Self Care | Payer: Medicare Other | Source: Ambulatory Visit | Attending: Radiation Oncology | Admitting: Radiation Oncology

## 2024-01-14 ENCOUNTER — Other Ambulatory Visit: Payer: Self-pay

## 2024-01-14 DIAGNOSIS — C250 Malignant neoplasm of head of pancreas: Secondary | ICD-10-CM | POA: Diagnosis not present

## 2024-01-14 DIAGNOSIS — Z510 Encounter for antineoplastic radiation therapy: Secondary | ICD-10-CM | POA: Diagnosis not present

## 2024-01-14 LAB — RAD ONC ARIA SESSION SUMMARY
Course Elapsed Days: 16
Plan Fractions Treated to Date: 12
Plan Prescribed Dose Per Fraction: 1.8 Gy
Plan Total Fractions Prescribed: 25
Plan Total Prescribed Dose: 45 Gy
Reference Point Dosage Given to Date: 21.6 Gy
Reference Point Session Dosage Given: 1.8 Gy
Session Number: 12

## 2024-01-15 ENCOUNTER — Other Ambulatory Visit: Payer: Self-pay

## 2024-01-15 ENCOUNTER — Ambulatory Visit
Admission: RE | Admit: 2024-01-15 | Discharge: 2024-01-15 | Disposition: A | Discharge status: Home / Self Care | Payer: Medicare Other | Source: Ambulatory Visit | Attending: Radiation Oncology | Admitting: Radiation Oncology

## 2024-01-15 DIAGNOSIS — Z510 Encounter for antineoplastic radiation therapy: Secondary | ICD-10-CM | POA: Diagnosis not present

## 2024-01-15 DIAGNOSIS — C250 Malignant neoplasm of head of pancreas: Secondary | ICD-10-CM | POA: Diagnosis not present

## 2024-01-15 LAB — RAD ONC ARIA SESSION SUMMARY
Course Elapsed Days: 17
Plan Fractions Treated to Date: 13
Plan Prescribed Dose Per Fraction: 1.8 Gy
Plan Total Fractions Prescribed: 25
Plan Total Prescribed Dose: 45 Gy
Reference Point Dosage Given to Date: 23.4 Gy
Reference Point Session Dosage Given: 1.8 Gy
Session Number: 13

## 2024-01-16 ENCOUNTER — Other Ambulatory Visit: Payer: Self-pay

## 2024-01-16 ENCOUNTER — Ambulatory Visit
Admission: RE | Admit: 2024-01-16 | Discharge: 2024-01-16 | Disposition: A | Discharge status: Home / Self Care | Payer: Medicare Other | Source: Ambulatory Visit | Attending: Radiation Oncology

## 2024-01-16 ENCOUNTER — Inpatient Hospital Stay: Payer: Medicare Other | Admitting: Dietician

## 2024-01-16 ENCOUNTER — Encounter: Payer: Medicare Other | Admitting: Dietician

## 2024-01-16 DIAGNOSIS — C250 Malignant neoplasm of head of pancreas: Secondary | ICD-10-CM | POA: Diagnosis not present

## 2024-01-16 DIAGNOSIS — Z510 Encounter for antineoplastic radiation therapy: Secondary | ICD-10-CM | POA: Diagnosis not present

## 2024-01-16 LAB — RAD ONC ARIA SESSION SUMMARY
Course Elapsed Days: 18
Plan Fractions Treated to Date: 14
Plan Prescribed Dose Per Fraction: 1.8 Gy
Plan Total Fractions Prescribed: 25
Plan Total Prescribed Dose: 45 Gy
Reference Point Dosage Given to Date: 25.2 Gy
Reference Point Session Dosage Given: 1.8 Gy
Session Number: 14

## 2024-01-16 NOTE — Progress Notes (Signed)
Nutrition Follow-up:  Pt with stage III pancreatic cancer. Patient completed 5 cycles gem/abraxane under the care of Dr. Truett Perna on 09/30/23. S/p aborted Whipple (11/18) secondary to involvement of SMV and transverse colon. She is currently receiving oral chemo with capecitabine concurrent with radiation   Met with patient and family in office following radiation. Patient reports increased fatigue this week. She has been pushing nutrition and has gained a couple of pounds. Patient eating 3 meals + snacks. She is drinking one Boost. Daughter reports fairlife whole milk is working well and pt enjoys this. Patient has intermittent nausea. Typically occurs in the evenings, however she report feeling nauseous this morning. Patient reports bowls are moving great. She has 2-3 formed movements/day.    Medications: reviewed   Labs: 1/7 - Na 131, glucose 150, Cr 0.36  Anthropometrics: Wt 116.2 on 1/10 (aria) increased from 114 lb on 12/17   NUTRITION DIAGNOSIS: Unintended wt loss - stable    INTERVENTION:  Continue high protein snacks in between meals Continue Boost Plus  High calorie recipes provided Discussed strategies for nausea, suggested taking zofran at bedtime to curb AM nausea    MONITORING, EVALUATION, GOAL: wt trends, intake   NEXT VISIT: Friday January 24 after RT

## 2024-01-19 ENCOUNTER — Other Ambulatory Visit: Payer: Self-pay

## 2024-01-19 ENCOUNTER — Ambulatory Visit
Admission: RE | Admit: 2024-01-19 | Discharge: 2024-01-19 | Disposition: A | Discharge status: Home / Self Care | Payer: Medicare Other | Source: Ambulatory Visit | Attending: Radiation Oncology | Admitting: Radiation Oncology

## 2024-01-19 DIAGNOSIS — C250 Malignant neoplasm of head of pancreas: Secondary | ICD-10-CM | POA: Diagnosis not present

## 2024-01-19 DIAGNOSIS — Z510 Encounter for antineoplastic radiation therapy: Secondary | ICD-10-CM | POA: Diagnosis not present

## 2024-01-19 LAB — RAD ONC ARIA SESSION SUMMARY
Course Elapsed Days: 21
Plan Fractions Treated to Date: 15
Plan Prescribed Dose Per Fraction: 1.8 Gy
Plan Total Fractions Prescribed: 25
Plan Total Prescribed Dose: 45 Gy
Reference Point Dosage Given to Date: 27 Gy
Reference Point Session Dosage Given: 1.8 Gy
Session Number: 15

## 2024-01-20 ENCOUNTER — Other Ambulatory Visit: Payer: Self-pay

## 2024-01-20 ENCOUNTER — Inpatient Hospital Stay: Payer: Medicare Other | Admitting: Dietician

## 2024-01-20 ENCOUNTER — Ambulatory Visit
Admission: RE | Admit: 2024-01-20 | Discharge: 2024-01-20 | Disposition: A | Discharge status: Home / Self Care | Payer: Medicare Other | Source: Ambulatory Visit | Attending: Radiation Oncology | Admitting: Radiation Oncology

## 2024-01-20 DIAGNOSIS — C250 Malignant neoplasm of head of pancreas: Secondary | ICD-10-CM | POA: Diagnosis not present

## 2024-01-20 DIAGNOSIS — Z510 Encounter for antineoplastic radiation therapy: Secondary | ICD-10-CM | POA: Diagnosis not present

## 2024-01-20 LAB — RAD ONC ARIA SESSION SUMMARY
Course Elapsed Days: 22
Plan Fractions Treated to Date: 16
Plan Prescribed Dose Per Fraction: 1.8 Gy
Plan Total Fractions Prescribed: 25
Plan Total Prescribed Dose: 45 Gy
Reference Point Dosage Given to Date: 28.8 Gy
Reference Point Session Dosage Given: 1.8 Gy
Session Number: 16

## 2024-01-21 ENCOUNTER — Other Ambulatory Visit: Payer: Self-pay

## 2024-01-21 ENCOUNTER — Ambulatory Visit
Admission: RE | Admit: 2024-01-21 | Discharge: 2024-01-21 | Disposition: A | Discharge status: Home / Self Care | Payer: Medicare Other | Source: Ambulatory Visit | Attending: Radiation Oncology

## 2024-01-21 DIAGNOSIS — Z510 Encounter for antineoplastic radiation therapy: Secondary | ICD-10-CM | POA: Diagnosis not present

## 2024-01-21 DIAGNOSIS — C250 Malignant neoplasm of head of pancreas: Secondary | ICD-10-CM | POA: Diagnosis not present

## 2024-01-21 LAB — RAD ONC ARIA SESSION SUMMARY
Course Elapsed Days: 23
Plan Fractions Treated to Date: 17
Plan Prescribed Dose Per Fraction: 1.8 Gy
Plan Total Fractions Prescribed: 25
Plan Total Prescribed Dose: 45 Gy
Reference Point Dosage Given to Date: 30.6 Gy
Reference Point Session Dosage Given: 1.8 Gy
Session Number: 17

## 2024-01-22 ENCOUNTER — Ambulatory Visit
Admission: RE | Admit: 2024-01-22 | Discharge: 2024-01-22 | Disposition: A | Discharge status: Home / Self Care | Payer: Medicare Other | Source: Ambulatory Visit | Attending: Radiation Oncology | Admitting: Radiation Oncology

## 2024-01-22 ENCOUNTER — Other Ambulatory Visit: Payer: Self-pay

## 2024-01-22 ENCOUNTER — Inpatient Hospital Stay: Payer: Medicare Other

## 2024-01-22 ENCOUNTER — Inpatient Hospital Stay: Payer: Medicare Other | Admitting: Oncology

## 2024-01-22 VITALS — BP 120/68 | HR 100 | Temp 98.1°F | Resp 18 | Ht 66.0 in | Wt 114.7 lb

## 2024-01-22 DIAGNOSIS — C250 Malignant neoplasm of head of pancreas: Secondary | ICD-10-CM

## 2024-01-22 DIAGNOSIS — Z510 Encounter for antineoplastic radiation therapy: Secondary | ICD-10-CM | POA: Diagnosis not present

## 2024-01-22 LAB — CMP (CANCER CENTER ONLY)
ALT: 10 U/L (ref 0–44)
AST: 16 U/L (ref 15–41)
Albumin: 3.2 g/dL — ABNORMAL LOW (ref 3.5–5.0)
Alkaline Phosphatase: 96 U/L (ref 38–126)
Anion gap: 7 (ref 5–15)
BUN: 13 mg/dL (ref 8–23)
CO2: 27 mmol/L (ref 22–32)
Calcium: 8.9 mg/dL (ref 8.9–10.3)
Chloride: 95 mmol/L — ABNORMAL LOW (ref 98–111)
Creatinine: 0.54 mg/dL (ref 0.44–1.00)
GFR, Estimated: >60 mL/min (ref >60)
Glucose, Bld: 146 mg/dL — ABNORMAL HIGH (ref 70–99)
Potassium: 3.6 mmol/L (ref 3.5–5.1)
Sodium: 129 mmol/L — ABNORMAL LOW (ref 135–145)
Total Bilirubin: 0.6 mg/dL (ref 0.0–1.2)
Total Protein: 5.2 g/dL — ABNORMAL LOW (ref 6.5–8.1)

## 2024-01-22 LAB — CBC WITH DIFFERENTIAL (CANCER CENTER ONLY)
Abs Immature Granulocytes: 0.01 10*3/uL (ref 0.00–0.07)
Basophils Absolute: 0 10*3/uL (ref 0.0–0.1)
Basophils Relative: 0 %
Eosinophils Absolute: 0.3 10*3/uL (ref 0.0–0.5)
Eosinophils Relative: 6 %
HCT: 31.8 % — ABNORMAL LOW (ref 36.0–46.0)
Hemoglobin: 10.3 g/dL — ABNORMAL LOW (ref 12.0–15.0)
Immature Granulocytes: 0 %
Lymphocytes Relative: 4 %
Lymphs Abs: 0.2 10*3/uL — ABNORMAL LOW (ref 0.7–4.0)
MCH: 28 pg (ref 26.0–34.0)
MCHC: 32.4 g/dL (ref 30.0–36.0)
MCV: 86.4 fL (ref 80.0–100.0)
Monocytes Absolute: 0.6 10*3/uL (ref 0.1–1.0)
Monocytes Relative: 13 %
Neutro Abs: 3.6 10*3/uL (ref 1.7–7.7)
Neutrophils Relative %: 77 %
Platelet Count: 225 10*3/uL (ref 150–400)
RBC: 3.68 MIL/uL — ABNORMAL LOW (ref 3.87–5.11)
RDW: 15.9 % — ABNORMAL HIGH (ref 11.5–15.5)
WBC Count: 4.6 10*3/uL (ref 4.0–10.5)
nRBC: 0 % (ref 0.0–0.2)

## 2024-01-22 LAB — RAD ONC ARIA SESSION SUMMARY
Course Elapsed Days: 24
Plan Fractions Treated to Date: 18
Plan Prescribed Dose Per Fraction: 1.8 Gy
Plan Total Fractions Prescribed: 25
Plan Total Prescribed Dose: 45 Gy
Reference Point Dosage Given to Date: 32.4 Gy
Reference Point Session Dosage Given: 1.8 Gy
Session Number: 18

## 2024-01-22 MED ORDER — SODIUM CHLORIDE 0.9% FLUSH
10.0000 mL | INTRAVENOUS | Status: DC | PRN
  Administered 2024-01-22: 10 mL

## 2024-01-22 MED ORDER — OXYCODONE HCL 5 MG PO TABS: 5.0000 mg | ORAL_TABLET | Freq: Four times a day (QID) | ORAL | 0 refills | Status: DC | PRN

## 2024-01-22 MED ORDER — ONDANSETRON HCL 8 MG PO TABS: 8.0000 mg | ORAL_TABLET | Freq: Three times a day (TID) | ORAL | 1 refills | Status: DC | PRN

## 2024-01-22 MED ORDER — HEPARIN SOD (PORK) LOCK FLUSH 100 UNIT/ML IV SOLN
500.0000 [IU] | Freq: Once | INTRAVENOUS | Status: AC | PRN
  Administered 2024-01-22: 500 [IU]

## 2024-01-22 NOTE — Progress Notes (Signed)
[facility] OFFICE PROGRESS NOTE   Diagnosis: Pancreas cancer  INTERVAL HISTORY:   Ms. Stoffers returns as scheduled.  She continues Xeloda and radiation.  No mouth sores, hand/foot pain, or diarrhea.  She has developed a rash over the face.  She has intermittent episodes of nausea and vomiting.  She no longer has pain at the anterolateral chest wall, but has developed increased pain at the lower back.  She takes tramadol in the morning and oxycodone at night.  Objective:  Vital signs in last 24 hours:  Blood pressure 120/68, pulse 100, temperature 98.1 F (36.7 C), resp. rate 18, height 5\' 6"  (1.676 m), weight 114 lb 11.2 oz (52 kg), SpO2 100%.    HEENT: No thrush or ulcers Resp: Lungs clear bilaterally Cardio: Irregular GI: No hepatosplenomegaly, no mass Vascular: No leg edema  Skin: Mild erythema of the dorsum of the hands, skin thickening and mild hyperpigmentation at the soles  Portacath/PICC-without erythema  Lab Results:  Lab Results  Component Value Date   WBC 4.6 01/22/2024   HGB 10.3 (L) 01/22/2024   HCT 31.8 (L) 01/22/2024   MCV 86.4 01/22/2024   PLT 225 01/22/2024   NEUTROABS 3.6 01/22/2024    CMP  Lab Results  Component Value Date   NA 129 (L) 01/22/2024   K 3.6 01/22/2024   CL 95 (L) 01/22/2024   CO2 27 01/22/2024   GLUCOSE 146 (H) 01/22/2024   BUN 13 01/22/2024   CREATININE 0.54 01/22/2024   CALCIUM 8.9 01/22/2024   PROT 5.2 (L) 01/22/2024   ALBUMIN 3.2 (L) 01/22/2024   AST 16 01/22/2024   ALT 10 01/22/2024   ALKPHOS 96 01/22/2024   BILITOT 0.6 01/22/2024   GFRNONAA >60 01/22/2024    Lab Results  Component Value Date   CAN199 206 (H) 01/06/2024     Medications: I have reviewed the patient's current medications.   Assessment/Plan: Pancreas cancer, clinical stage IIa (T3 N0)-pancreas head mass on CT abdomen/pelvis 07/07/2023, biopsy of duodenal stricture 07/08/2023-poorly differentiated adenocarcinoma CT abdomen/pelvis  07/07/2023-5.3 cm pancreas head mass with probable invasion of the duodenum seen him and abutment of the SMV without invasion or encasement.  Fluid-filled and distended stomach and proximal duodenum, single 11 mm periportal lymph node EGD 07/08/2023-large food residue in the stomach, extrinsic deformity at the first portion of the duodenum-biopsied Elevated CA 19-9 CT chest 07/11/2023-scattered small pulmonary nodules-indeterminate Cycle 1 gemcitabine/Abraxane 08/05/2023 Cycle 2 gemcitabine/Abraxane 08/19/2023 Cycle 3 gemcitabine/Abraxane 09/02/2023 Cycle 4 gemcitabine/Abraxane 09/16/2023 Cycle 5 gemcitabine/Abraxane 09/30/2023 CTs 10/09/2023-increase size of pancreas mass, increased pancreatic duct caliber and new biliary dilation, bilateral lower lobe nodules are stable, hypodense left thyroid nodule Cycle 6 gemcitabine/Abraxane 10/13/2023 10/28/2023 chemotherapy held due to LFT abnormalities Biliary drain placed 11/04/2023; internal/external biliary drain 11/06/2023 Exploratory laparotomy, cholecystectomy, choledochojejunostomy 11/17/2023-bulky tumor of the pancreas head with involvement of the transverse mesocolon and probable involvement of the distal SMV-plan for pancreaticoduodenectomy aborted Case presented at GI tumor conference 12/03/2023-recommendation to proceed with capecitabine/radiation Referred to Dr. Mitzi Hansen 12/09/2023 Radiation/capecitabine 12/29/2023   2.   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   Anemia 4.   Iron deficiency 5.   B12 deficiency 6.   Atrial fibrillation-maintained on Xarelto prior to hospital admission 7.  Family history of pancreas and uterine cancer 8.  Hyperlipidemia 9.  Genetics-negative     Disposition: Ms. Gort appears unchanged.  She has intermittent nausea and episodes of vomiting.  The nausea  may be related to treatment, tumor, or gastric outlet obstruction.  She will continue a  liquid/mechanical soft diet.  She will call if the nausea progresses.  The pain may be related to the pancreas tumor.  She will continue tramadol and oxycodone as needed.  We will check a lipase today.  Ms. Mcphaul will use Voltaren gel on the hands and feet if they become painful.  She will return for an office and lab visit in 2 weeks.  [name], MD  01/22/2024  9:48 AM

## 2024-01-23 ENCOUNTER — Inpatient Hospital Stay: Payer: Medicare Other | Admitting: Dietician

## 2024-01-23 ENCOUNTER — Other Ambulatory Visit: Payer: Self-pay

## 2024-01-23 ENCOUNTER — Ambulatory Visit
Admission: RE | Admit: 2024-01-23 | Discharge: 2024-01-23 | Disposition: A | Discharge status: Home / Self Care | Payer: Medicare Other | Source: Ambulatory Visit | Attending: Radiation Oncology | Admitting: Radiation Oncology

## 2024-01-23 ENCOUNTER — Ambulatory Visit
Admission: RE | Admit: 2024-01-23 | Discharge: 2024-01-23 | Disposition: A | Discharge status: Home / Self Care | Payer: Medicare Other | Source: Ambulatory Visit | Attending: Radiation Oncology

## 2024-01-23 DIAGNOSIS — C250 Malignant neoplasm of head of pancreas: Secondary | ICD-10-CM | POA: Diagnosis not present

## 2024-01-23 DIAGNOSIS — Z510 Encounter for antineoplastic radiation therapy: Secondary | ICD-10-CM | POA: Diagnosis not present

## 2024-01-23 LAB — RAD ONC ARIA SESSION SUMMARY
Course Elapsed Days: 25
Plan Fractions Treated to Date: 19
Plan Prescribed Dose Per Fraction: 1.8 Gy
Plan Total Fractions Prescribed: 25
Plan Total Prescribed Dose: 45 Gy
Reference Point Dosage Given to Date: 34.2 Gy
Reference Point Session Dosage Given: 1.8 Gy
Session Number: 19

## 2024-01-23 NOTE — Progress Notes (Signed)
Nutrition Follow-up:   Pt with stage III pancreatic cancer. Patient completed 5 cycles gem/abraxane under the care of Dr. Truett Perna on 09/30/23. S/p aborted Whipple (11/18) secondary to involvement of SMV and transverse colon. She is currently receiving oral chemo with capecitabine concurrent with radiation   Met with patient and family in office following radiation. She reports eating has been the hardest part of the treatment process. Patient continues to maintain weight and pushing nutrition. She is feeling more fatigued. Patient reports nausea regimen per Dr. Truett Perna is working well.   Medications: reviewed  Labs: glucose 146, Na 129  Anthropometrics: Wt 116 lb per pt at weekly UT today  1/17 - 116.8 lb 1/10 - 116.2 lb  12/17 - 114 lb    NUTRITION DIAGNOSIS: Unintended wt loss - stable   INTERVENTION:  Continue small frequent meals/snacks with adequate calories and protein  Support and encouragement     MONITORING, EVALUATION, GOAL: wt trends, intake   NEXT VISIT: Tuesday February 4

## 2024-01-26 ENCOUNTER — Other Ambulatory Visit: Payer: Self-pay

## 2024-01-26 ENCOUNTER — Ambulatory Visit
Admission: RE | Admit: 2024-01-26 | Discharge: 2024-01-26 | Disposition: A | Discharge status: Home / Self Care | Payer: Medicare Other | Source: Ambulatory Visit | Attending: Radiation Oncology

## 2024-01-26 DIAGNOSIS — Z510 Encounter for antineoplastic radiation therapy: Secondary | ICD-10-CM | POA: Diagnosis not present

## 2024-01-26 DIAGNOSIS — C250 Malignant neoplasm of head of pancreas: Secondary | ICD-10-CM | POA: Diagnosis not present

## 2024-01-26 LAB — RAD ONC ARIA SESSION SUMMARY
Course Elapsed Days: 28
Plan Fractions Treated to Date: 20
Plan Prescribed Dose Per Fraction: 1.8 Gy
Plan Total Fractions Prescribed: 25
Plan Total Prescribed Dose: 45 Gy
Reference Point Dosage Given to Date: 36 Gy
Reference Point Session Dosage Given: 1.8 Gy
Session Number: 20

## 2024-01-27 ENCOUNTER — Other Ambulatory Visit: Payer: Self-pay

## 2024-01-27 ENCOUNTER — Inpatient Hospital Stay: Payer: Medicare Other | Admitting: Dietician

## 2024-01-27 ENCOUNTER — Ambulatory Visit
Admission: RE | Admit: 2024-01-27 | Discharge: 2024-01-27 | Disposition: A | Discharge status: Home / Self Care | Payer: Medicare Other | Source: Ambulatory Visit | Attending: Radiation Oncology | Admitting: Radiation Oncology

## 2024-01-27 DIAGNOSIS — Z510 Encounter for antineoplastic radiation therapy: Secondary | ICD-10-CM | POA: Diagnosis not present

## 2024-01-27 DIAGNOSIS — C250 Malignant neoplasm of head of pancreas: Secondary | ICD-10-CM | POA: Diagnosis not present

## 2024-01-27 LAB — RAD ONC ARIA SESSION SUMMARY
Course Elapsed Days: 29
Plan Fractions Treated to Date: 21
Plan Prescribed Dose Per Fraction: 1.8 Gy
Plan Total Fractions Prescribed: 25
Plan Total Prescribed Dose: 45 Gy
Reference Point Dosage Given to Date: 37.8 Gy
Reference Point Session Dosage Given: 1.8 Gy
Session Number: 21

## 2024-01-28 ENCOUNTER — Ambulatory Visit
Admission: RE | Admit: 2024-01-28 | Discharge: 2024-01-28 | Disposition: A | Discharge status: Home / Self Care | Payer: Medicare Other | Source: Ambulatory Visit | Attending: Radiation Oncology | Admitting: Radiation Oncology

## 2024-01-28 ENCOUNTER — Other Ambulatory Visit: Payer: Self-pay

## 2024-01-28 DIAGNOSIS — C250 Malignant neoplasm of head of pancreas: Secondary | ICD-10-CM | POA: Diagnosis not present

## 2024-01-28 DIAGNOSIS — Z510 Encounter for antineoplastic radiation therapy: Secondary | ICD-10-CM | POA: Diagnosis not present

## 2024-01-28 LAB — RAD ONC ARIA SESSION SUMMARY
Course Elapsed Days: 30
Plan Fractions Treated to Date: 22
Plan Prescribed Dose Per Fraction: 1.8 Gy
Plan Total Fractions Prescribed: 25
Plan Total Prescribed Dose: 45 Gy
Reference Point Dosage Given to Date: 39.6 Gy
Reference Point Session Dosage Given: 1.8 Gy
Session Number: 22

## 2024-01-29 ENCOUNTER — Ambulatory Visit
Admission: RE | Admit: 2024-01-29 | Discharge: 2024-01-29 | Disposition: A | Discharge status: Home / Self Care | Payer: Medicare Other | Source: Ambulatory Visit | Attending: Radiation Oncology | Admitting: Radiation Oncology

## 2024-01-29 ENCOUNTER — Other Ambulatory Visit: Payer: Self-pay

## 2024-01-29 DIAGNOSIS — Z510 Encounter for antineoplastic radiation therapy: Secondary | ICD-10-CM | POA: Diagnosis not present

## 2024-01-29 DIAGNOSIS — C250 Malignant neoplasm of head of pancreas: Secondary | ICD-10-CM | POA: Diagnosis not present

## 2024-01-29 LAB — RAD ONC ARIA SESSION SUMMARY
Course Elapsed Days: 31
Plan Fractions Treated to Date: 23
Plan Prescribed Dose Per Fraction: 1.8 Gy
Plan Total Fractions Prescribed: 25
Plan Total Prescribed Dose: 45 Gy
Reference Point Dosage Given to Date: 41.4 Gy
Reference Point Session Dosage Given: 1.8 Gy
Session Number: 23

## 2024-01-30 ENCOUNTER — Ambulatory Visit
Admission: RE | Admit: 2024-01-30 | Discharge: 2024-01-30 | Disposition: A | Discharge status: Home / Self Care | Payer: Medicare Other | Source: Ambulatory Visit | Attending: Radiation Oncology

## 2024-01-30 ENCOUNTER — Ambulatory Visit
Admission: RE | Admit: 2024-01-30 | Discharge: 2024-01-30 | Disposition: A | Discharge status: Home / Self Care | Payer: Medicare Other | Source: Ambulatory Visit | Attending: Radiation Oncology | Admitting: Radiation Oncology

## 2024-01-30 ENCOUNTER — Other Ambulatory Visit: Payer: Self-pay

## 2024-01-30 DIAGNOSIS — Z510 Encounter for antineoplastic radiation therapy: Secondary | ICD-10-CM | POA: Diagnosis not present

## 2024-01-30 DIAGNOSIS — C250 Malignant neoplasm of head of pancreas: Secondary | ICD-10-CM | POA: Diagnosis not present

## 2024-01-30 LAB — RAD ONC ARIA SESSION SUMMARY
Course Elapsed Days: 32
Plan Fractions Treated to Date: 24
Plan Prescribed Dose Per Fraction: 1.8 Gy
Plan Total Fractions Prescribed: 25
Plan Total Prescribed Dose: 45 Gy
Reference Point Dosage Given to Date: 43.2 Gy
Reference Point Session Dosage Given: 1.8 Gy
Session Number: 24

## 2024-02-02 ENCOUNTER — Ambulatory Visit
Admission: RE | Admit: 2024-02-02 | Discharge: 2024-02-02 | Disposition: A | Discharge status: Home / Self Care | Payer: Medicare Other | Source: Ambulatory Visit | Attending: Radiation Oncology | Admitting: Radiation Oncology

## 2024-02-02 ENCOUNTER — Other Ambulatory Visit: Payer: Self-pay

## 2024-02-02 DIAGNOSIS — C250 Malignant neoplasm of head of pancreas: Secondary | ICD-10-CM | POA: Insufficient documentation

## 2024-02-02 DIAGNOSIS — Z510 Encounter for antineoplastic radiation therapy: Secondary | ICD-10-CM | POA: Insufficient documentation

## 2024-02-02 LAB — RAD ONC ARIA SESSION SUMMARY
Course Elapsed Days: 35
Plan Fractions Treated to Date: 25
Plan Prescribed Dose Per Fraction: 1.8 Gy
Plan Total Fractions Prescribed: 25
Plan Total Prescribed Dose: 45 Gy
Reference Point Dosage Given to Date: 45 Gy
Reference Point Session Dosage Given: 1.8 Gy
Session Number: 25

## 2024-02-03 ENCOUNTER — Inpatient Hospital Stay: Payer: Medicare Other | Admitting: Dietician

## 2024-02-03 ENCOUNTER — Ambulatory Visit
Admission: RE | Admit: 2024-02-03 | Discharge: 2024-02-03 | Disposition: A | Discharge status: Home / Self Care | Payer: Medicare Other | Source: Ambulatory Visit | Attending: Radiation Oncology

## 2024-02-03 ENCOUNTER — Other Ambulatory Visit: Payer: Self-pay

## 2024-02-03 DIAGNOSIS — C250 Malignant neoplasm of head of pancreas: Secondary | ICD-10-CM | POA: Diagnosis not present

## 2024-02-03 DIAGNOSIS — Z510 Encounter for antineoplastic radiation therapy: Secondary | ICD-10-CM | POA: Diagnosis not present

## 2024-02-03 LAB — RAD ONC ARIA SESSION SUMMARY
Course Elapsed Days: 36
Plan Fractions Treated to Date: 1
Plan Prescribed Dose Per Fraction: 1.8 Gy
Plan Total Fractions Prescribed: 3
Plan Total Prescribed Dose: 5.4 Gy
Reference Point Dosage Given to Date: 1.8 Gy
Reference Point Session Dosage Given: 1.8 Gy
Session Number: 26

## 2024-02-04 ENCOUNTER — Ambulatory Visit
Admission: RE | Admit: 2024-02-04 | Discharge: 2024-02-04 | Disposition: A | Discharge status: Home / Self Care | Payer: Medicare Other | Source: Ambulatory Visit | Attending: Radiation Oncology

## 2024-02-04 ENCOUNTER — Other Ambulatory Visit: Payer: Self-pay

## 2024-02-04 DIAGNOSIS — Z510 Encounter for antineoplastic radiation therapy: Secondary | ICD-10-CM | POA: Diagnosis not present

## 2024-02-04 DIAGNOSIS — C250 Malignant neoplasm of head of pancreas: Secondary | ICD-10-CM | POA: Diagnosis not present

## 2024-02-04 LAB — RAD ONC ARIA SESSION SUMMARY
Course Elapsed Days: 37
Plan Fractions Treated to Date: 2
Plan Prescribed Dose Per Fraction: 1.8 Gy
Plan Total Fractions Prescribed: 3
Plan Total Prescribed Dose: 5.4 Gy
Reference Point Dosage Given to Date: 3.6 Gy
Reference Point Session Dosage Given: 1.8 Gy
Session Number: 27

## 2024-02-05 ENCOUNTER — Inpatient Hospital Stay: Payer: Medicare Other

## 2024-02-05 ENCOUNTER — Inpatient Hospital Stay (HOSPITAL_BASED_OUTPATIENT_CLINIC_OR_DEPARTMENT_OTHER): Payer: Medicare Other | Admitting: Oncology

## 2024-02-05 ENCOUNTER — Ambulatory Visit
Admission: RE | Admit: 2024-02-05 | Discharge: 2024-02-05 | Disposition: A | Discharge status: Home / Self Care | Payer: Medicare Other | Source: Ambulatory Visit | Attending: Radiation Oncology | Admitting: Radiation Oncology

## 2024-02-05 ENCOUNTER — Inpatient Hospital Stay: Payer: Medicare Other | Attending: Oncology

## 2024-02-05 ENCOUNTER — Other Ambulatory Visit: Payer: Self-pay

## 2024-02-05 VITALS — BP 109/64 | HR 100 | Temp 98.1°F | Resp 18 | Ht 66.0 in | Wt 112.9 lb

## 2024-02-05 DIAGNOSIS — C250 Malignant neoplasm of head of pancreas: Secondary | ICD-10-CM

## 2024-02-05 DIAGNOSIS — M545 Low back pain, unspecified: Secondary | ICD-10-CM | POA: Insufficient documentation

## 2024-02-05 DIAGNOSIS — Z510 Encounter for antineoplastic radiation therapy: Secondary | ICD-10-CM | POA: Diagnosis not present

## 2024-02-05 LAB — CBC WITH DIFFERENTIAL (CANCER CENTER ONLY)
Abs Immature Granulocytes: 0.01 10*3/uL (ref 0.00–0.07)
Basophils Absolute: 0 10*3/uL (ref 0.0–0.1)
Basophils Relative: 0 %
Eosinophils Absolute: 0.1 10*3/uL (ref 0.0–0.5)
Eosinophils Relative: 3 %
HCT: 33.6 % — ABNORMAL LOW (ref 36.0–46.0)
Hemoglobin: 11 g/dL — ABNORMAL LOW (ref 12.0–15.0)
Immature Granulocytes: 0 %
Lymphocytes Relative: 1 %
Lymphs Abs: 0 10*3/uL — ABNORMAL LOW (ref 0.7–4.0)
MCH: 28.2 pg (ref 26.0–34.0)
MCHC: 32.7 g/dL (ref 30.0–36.0)
MCV: 86.2 fL (ref 80.0–100.0)
Monocytes Absolute: 0.2 10*3/uL (ref 0.1–1.0)
Monocytes Relative: 7 %
Neutro Abs: 2.5 10*3/uL (ref 1.7–7.7)
Neutrophils Relative %: 89 %
Platelet Count: 184 10*3/uL (ref 150–400)
RBC: 3.9 MIL/uL (ref 3.87–5.11)
RDW: 17.8 % — ABNORMAL HIGH (ref 11.5–15.5)
WBC Count: 2.8 10*3/uL — ABNORMAL LOW (ref 4.0–10.5)
nRBC: 0 % (ref 0.0–0.2)

## 2024-02-05 LAB — RAD ONC ARIA SESSION SUMMARY
Course Elapsed Days: 38
Plan Fractions Treated to Date: 3
Plan Prescribed Dose Per Fraction: 1.8 Gy
Plan Total Fractions Prescribed: 3
Plan Total Prescribed Dose: 5.4 Gy
Reference Point Dosage Given to Date: 5.4 Gy
Reference Point Session Dosage Given: 1.8 Gy
Session Number: 28

## 2024-02-05 LAB — CMP (CANCER CENTER ONLY)
ALT: 13 U/L (ref 0–44)
AST: 22 U/L (ref 15–41)
Albumin: 2.7 g/dL — ABNORMAL LOW (ref 3.5–5.0)
Alkaline Phosphatase: 117 U/L (ref 38–126)
Anion gap: 5 (ref 5–15)
BUN: 18 mg/dL (ref 8–23)
CO2: 27 mmol/L (ref 22–32)
Calcium: 8.4 mg/dL — ABNORMAL LOW (ref 8.9–10.3)
Chloride: 96 mmol/L — ABNORMAL LOW (ref 98–111)
Creatinine: 0.42 mg/dL — ABNORMAL LOW (ref 0.44–1.00)
GFR, Estimated: >60 mL/min (ref >60)
Glucose, Bld: 141 mg/dL — ABNORMAL HIGH (ref 70–99)
Potassium: 3.9 mmol/L (ref 3.5–5.1)
Sodium: 128 mmol/L — ABNORMAL LOW (ref 135–145)
Total Bilirubin: 0.7 mg/dL (ref 0.0–1.2)
Total Protein: 4.6 g/dL — ABNORMAL LOW (ref 6.5–8.1)

## 2024-02-05 LAB — LIPASE, BLOOD: Lipase: <10 U/L — ABNORMAL LOW (ref 11–51)

## 2024-02-05 MED ORDER — PROCHLORPERAZINE MALEATE 5 MG PO TABS: 5.0000 mg | ORAL_TABLET | Freq: Four times a day (QID) | ORAL | 2 refills | Status: DC | PRN

## 2024-02-05 MED ORDER — HEPARIN SOD (PORK) LOCK FLUSH 100 UNIT/ML IV SOLN
500.0000 [IU] | Freq: Once | INTRAVENOUS | Status: AC | PRN
  Administered 2024-02-05: 500 [IU]

## 2024-02-05 MED ORDER — SODIUM CHLORIDE 0.9% FLUSH
10.0000 mL | INTRAVENOUS | Status: DC | PRN
Start: 2024-02-05 — End: 2024-02-05
  Administered 2024-02-05: 10 mL

## 2024-02-05 MED ORDER — TRAMADOL HCL 50 MG PO TABS: 50.0000 mg | ORAL_TABLET | Freq: Four times a day (QID) | ORAL | 0 refills | Status: DC | PRN

## 2024-02-05 NOTE — Progress Notes (Signed)
 [facility] OFFICE PROGRESS NOTE   Diagnosis: Pancreas cancer  INTERVAL HISTORY:   Wendy [name] returns as scheduled.  She is here with her daughter.  Continues Xeloda  and radiation.  She is scheduled for a final radiation treatment today.  No mouth sores, diarrhea, or hand/foot pain.  She reports malaise.  Her chief complaint is mid and lower back pain.  The pain is relieved with oxycodone  at night.  She takes tramadol  during the day.  She thinks the pain may be related to laying on the radiation table.  Objective:  Vital signs in last 24 hours:  Blood pressure 109/64, pulse 100, temperature 98.1 F (36.7 C), temperature source Temporal, resp. rate 18, height 5' 6 (1.676 m), weight 112 lb 14.4 oz (51.2 kg), SpO2 99%.    HEENT: No thrush or ulcers Resp: Bilaterally Cardio: Regular rate and rhythm GI: No hepatosplenomegaly, no mass, nontender Vascular: Trace pitting edema at the left wrist and hand, no arm edema or erythema, trace pedal edema bilaterally Skin: Mild erythema of the palms.  No erythema of the soles. Musculoskeletal: No spine tenderness  Portacath/PICC-without erythema  Lab Results:  Lab Results  Component Value Date   WBC 2.8 (L) 02/05/2024   HGB 11.0 (L) 02/05/2024   HCT 33.6 (L) 02/05/2024   MCV 86.2 02/05/2024   PLT 184 02/05/2024   NEUTROABS 2.5 02/05/2024    CMP  Lab Results  Component Value Date   NA 128 (L) 02/05/2024   K 3.9 02/05/2024   CL 96 (L) 02/05/2024   CO2 27 02/05/2024   GLUCOSE 141 (H) 02/05/2024   BUN 18 02/05/2024   CREATININE 0.42 (L) 02/05/2024   CALCIUM 8.4 (L) 02/05/2024   PROT 4.6 (L) 02/05/2024   ALBUMIN  2.7 (L) 02/05/2024   AST 22 02/05/2024   ALT 13 02/05/2024   ALKPHOS 117 02/05/2024   BILITOT 0.7 02/05/2024   GFRNONAA >60 02/05/2024    Lab Results  Component Value Date   CAN199 206 (H) 01/06/2024    Medications: I have reviewed the patient's current  medications.   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  Fluid-filled and distended stomach and proximal duodenum, single 11 mm periportal lymph node EGD 07/08/2023-large food residue in the stomach, extrinsic deformity at the first portion of the duodenum-biopsied Elevated CA 19-9 CT chest 07/11/2023-scattered small pulmonary nodules-indeterminate Cycle 1 gemcitabine /Abraxane  08/05/2023 Cycle 2 gemcitabine /Abraxane  08/19/2023 Cycle 3 gemcitabine /Abraxane  09/02/2023 Cycle 4 gemcitabine /Abraxane  09/16/2023 Cycle 5 gemcitabine /Abraxane  09/30/2023 CTs 10/09/2023-increase size of pancreas mass, increased pancreatic duct caliber and new biliary dilation, bilateral lower lobe nodules are stable, hypodense left thyroid  nodule Cycle 6 gemcitabine /Abraxane  10/13/2023 10/28/2023 chemotherapy held due to LFT abnormalities Biliary drain placed 11/04/2023; internal/external biliary drain 11/06/2023 Exploratory laparotomy, cholecystectomy, choledochojejunostomy 11/17/2023-bulky tumor of the pancreas head with involvement of the transverse mesocolon and probable involvement of the distal SMV-plan for pancreaticoduodenectomy aborted Case presented at GI tumor conference 12/03/2023-recommendation to proceed with capecitabine /radiation Referred to Dr. Dewey 12/09/2023 Radiation/capecitabine  12/29/2023-02/05/2024   2.   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   Anemia 4.   Iron deficiency 5.   B12 deficiency 6.   Atrial fibrillation-maintained on Xarelto  prior to hospital admission 7.  Family history  of pancreas and uterine cancer 8.  Hyperlipidemia 9.   Genetics-negative     Disposition: Wendy [name] has a history of locally advanced pancreas cancer.  She is completing a course of capecitabine  and radiation.  She will complete treatment today.  She has malaise secondary to the current treatment.  The etiology of the back pain is unclear.  It is possible the pain is related to positioning on the radiation table versus tumor related pain.  The lipase is not elevated.  She will continue tramadol  and oxycodone .  She will call for pain not relieved with this regimen.  She will return for an office visit in 2 weeks.  We will refer her for imaging if the pain persist.  Arley Hof, MD  02/05/2024  11:37 AM

## 2024-02-05 NOTE — Patient Instructions (Signed)

## 2024-02-06 NOTE — Radiation Completion Notes (Addendum)
  Radiation Oncology         [phone] ________________________________  Name: Wendy [name] MRN: [id number]  Date of Service: 02/05/2024  DOB: 1934-06-30  End of Treatment Note   Diagnosis:    Stage IIA, cT3N0M0, unresectable adenocarcinoma of the pancreatic head with invasion into the meosocolon   Intent: Curative     ==========DELIVERED PLANS==========  First Treatment Date: 2023-12-29 Last Treatment Date: 2024-02-05   Plan Name: Pancreas Site: Pancreas Technique: IMRT Mode: Photon Dose Per Fraction: 1.8 Gy Prescribed Dose (Delivered / Prescribed): 45 Gy / 45 Gy Prescribed Fxs (Delivered / Prescribed): 25 / 25   Plan Name: Pancreas_Bst Site: Pancreas Technique: IMRT Mode: Photon Dose Per Fraction: 1.8 Gy Prescribed Dose (Delivered / Prescribed): 5.4 Gy / 5.4 Gy Prescribed Fxs (Delivered / Prescribed): 3 / 3     ==========ON TREATMENT VISIT DATES========== 2024-01-02, 2024-01-09, 2024-01-16, 2024-01-23, 2024-01-30, 2024-02-05    See weekly On Treatment Notes in Epic for details in the Media tab (listed as Progress notes on the On Treatment Visit Dates listed above). The patient tolerated radiation. She developed fatigue, nausea, and had some discomfort in the mid abdomen during treatment.  The patient will receive a call in about one month from the radiation oncology department. She will continue follow up with Dr. Cloretta as well.      Donald KYM Husband, PAC

## 2024-02-11 ENCOUNTER — Encounter: Payer: Self-pay | Admitting: Dietician

## 2024-02-11 NOTE — Progress Notes (Signed)
Patient identified on MST (malnutrition screening tool) report  RD previously following weekly. Patient and daughter requested to discontinue nutrition follow-ups s/p 3 visits. Patient very appreciative of assistance. Nutrition recommendations implemented and did not feel she needed future visits.   Patient as well as daughter have contact information. They will contact RD with nutrition questions/concerns as needed.

## 2024-02-16 ENCOUNTER — Inpatient Hospital Stay: Payer: Medicare Other

## 2024-02-16 ENCOUNTER — Inpatient Hospital Stay (HOSPITAL_BASED_OUTPATIENT_CLINIC_OR_DEPARTMENT_OTHER): Payer: Medicare Other | Admitting: Oncology

## 2024-02-16 ENCOUNTER — Other Ambulatory Visit: Payer: Medicare Other

## 2024-02-16 ENCOUNTER — Other Ambulatory Visit: Payer: Self-pay | Admitting: *Deleted

## 2024-02-16 ENCOUNTER — Ambulatory Visit (HOSPITAL_BASED_OUTPATIENT_CLINIC_OR_DEPARTMENT_OTHER)
Admission: RE | Admit: 2024-02-16 | Discharge: 2024-02-16 | Disposition: A | Discharge status: Home / Self Care | Payer: Medicare Other | Source: Ambulatory Visit | Attending: Oncology | Admitting: Oncology

## 2024-02-16 ENCOUNTER — Ambulatory Visit: Payer: Medicare Other

## 2024-02-16 VITALS — BP 92/69 | HR 102 | Temp 98.1°F | Resp 18 | Ht 66.0 in | Wt 110.0 lb

## 2024-02-16 DIAGNOSIS — C259 Malignant neoplasm of pancreas, unspecified: Secondary | ICD-10-CM | POA: Diagnosis not present

## 2024-02-16 DIAGNOSIS — I700 Atherosclerosis of aorta: Secondary | ICD-10-CM | POA: Diagnosis not present

## 2024-02-16 DIAGNOSIS — M545 Low back pain, unspecified: Secondary | ICD-10-CM | POA: Diagnosis not present

## 2024-02-16 DIAGNOSIS — C250 Malignant neoplasm of head of pancreas: Secondary | ICD-10-CM | POA: Diagnosis not present

## 2024-02-16 DIAGNOSIS — K8689 Other specified diseases of pancreas: Secondary | ICD-10-CM | POA: Diagnosis not present

## 2024-02-16 LAB — CBC WITH DIFFERENTIAL (CANCER CENTER ONLY)
Abs Immature Granulocytes: 0.06 10*3/uL (ref 0.00–0.07)
Basophils Absolute: 0 10*3/uL (ref 0.0–0.1)
Basophils Relative: 0 %
Eosinophils Absolute: 0 10*3/uL (ref 0.0–0.5)
Eosinophils Relative: 0 %
HCT: 35.8 % — ABNORMAL LOW (ref 36.0–46.0)
Hemoglobin: 11.7 g/dL — ABNORMAL LOW (ref 12.0–15.0)
Immature Granulocytes: 1 %
Lymphocytes Relative: 6 %
Lymphs Abs: 0.3 10*3/uL — ABNORMAL LOW (ref 0.7–4.0)
MCH: 28.1 pg (ref 26.0–34.0)
MCHC: 32.7 g/dL (ref 30.0–36.0)
MCV: 86.1 fL (ref 80.0–100.0)
Monocytes Absolute: 0.5 10*3/uL (ref 0.1–1.0)
Monocytes Relative: 9 %
Neutro Abs: 4.5 10*3/uL (ref 1.7–7.7)
Neutrophils Relative %: 84 %
Platelet Count: 172 10*3/uL (ref 150–400)
RBC: 4.16 MIL/uL (ref 3.87–5.11)
RDW: 20.3 % — ABNORMAL HIGH (ref 11.5–15.5)
WBC Count: 5.4 10*3/uL (ref 4.0–10.5)
nRBC: 0 % (ref 0.0–0.2)

## 2024-02-16 LAB — CMP (CANCER CENTER ONLY)
ALT: 25 U/L (ref 0–44)
AST: 39 U/L (ref 15–41)
Albumin: 2.3 g/dL — ABNORMAL LOW (ref 3.5–5.0)
Alkaline Phosphatase: 168 U/L — ABNORMAL HIGH (ref 38–126)
Anion gap: 5 (ref 5–15)
BUN: 25 mg/dL — ABNORMAL HIGH (ref 8–23)
CO2: 28 mmol/L (ref 22–32)
Calcium: 8.5 mg/dL — ABNORMAL LOW (ref 8.9–10.3)
Chloride: 93 mmol/L — ABNORMAL LOW (ref 98–111)
Creatinine: 0.46 mg/dL (ref 0.44–1.00)
GFR, Estimated: >60 mL/min (ref >60)
Glucose, Bld: 130 mg/dL — ABNORMAL HIGH (ref 70–99)
Potassium: 4.4 mmol/L (ref 3.5–5.1)
Sodium: 126 mmol/L — ABNORMAL LOW (ref 135–145)
Total Bilirubin: 0.8 mg/dL (ref 0.0–1.2)
Total Protein: 4.5 g/dL — ABNORMAL LOW (ref 6.5–8.1)

## 2024-02-16 MED ORDER — HEPARIN SOD (PORK) LOCK FLUSH 100 UNIT/ML IV SOLN
500.0000 [IU] | Freq: Once | INTRAVENOUS | Status: AC
  Administered 2024-02-16: 500 [IU] via INTRAVENOUS

## 2024-02-16 MED ORDER — SODIUM CHLORIDE 0.9% FLUSH
10.0000 mL | INTRAVENOUS | Status: DC | PRN
  Administered 2024-02-16: 10 mL

## 2024-02-16 MED ORDER — HYDROMORPHONE HCL 2 MG PO TABS: 2.0000 mg | ORAL_TABLET | ORAL | 0 refills | Status: DC | PRN

## 2024-02-16 MED ORDER — IOHEXOL 300 MG/ML  SOLN
80.0000 mL | Freq: Once | INTRAMUSCULAR | Status: AC | PRN
  Administered 2024-02-16: 80 mL via INTRAVENOUS

## 2024-02-16 MED ORDER — HEPARIN SOD (PORK) LOCK FLUSH 100 UNIT/ML IV SOLN
500.0000 [IU] | Freq: Once | INTRAVENOUS | Status: AC | PRN
  Administered 2024-02-16: 500 [IU]

## 2024-02-16 MED ORDER — MORPHINE SULFATE (PF) 2 MG/ML IV SOLN
2.0000 mg | Freq: Once | INTRAVENOUS | Status: AC
  Administered 2024-02-16: 2 mg via INTRAVENOUS
  Filled 2024-02-16: qty 1

## 2024-02-16 MED ORDER — SODIUM CHLORIDE 0.9% FLUSH
10.0000 mL | Freq: Once | INTRAVENOUS | Status: AC
  Administered 2024-02-16: 10 mL via INTRAVENOUS

## 2024-02-16 MED ORDER — SODIUM CHLORIDE 0.9 % IV SOLN
INTRAVENOUS | Status: AC
Start: 2024-02-16 — End: 2024-02-16

## 2024-02-16 NOTE — Patient Instructions (Signed)

## 2024-02-16 NOTE — Addendum Note (Signed)
 Addended by: [name] B on: 02/16/2024 03:58 PM   Modules accepted: Orders

## 2024-02-16 NOTE — Progress Notes (Addendum)
 [facility] OFFICE PROGRESS NOTE   Diagnosis: Pancreas cancer  INTERVAL HISTORY:   Wendy [name] completed concurrent Xeloda and radiation on 02/05/2024.  She is here today with her daughter.  She has generalized weakness.  She ambulates in the home with assistance.  She complains of severe back pain.  The pain is chiefly at the low back/sacrum.  She is taking up to 15 mg of oxycodone without adequate pain relief.  She noted 1 sore at the upper palate.  No diarrhea or hand/foot pain. She complains of early satiety and intermittent vomiting. Objective:  Vital signs in last 24 hours:  Blood pressure 92/69, pulse (!) 102, temperature 98.1 F (36.7 C), temperature source Temporal, resp. rate 18, height 5\' 6"  (1.676 m), weight 110 lb (49.9 kg), SpO2 100%.    HEENT: No thrush or ulcers, the mouth is dry Resp: Clear bilaterally Cardio: Regular rate and rhythm GI: No hepatosplenomegaly, no mass Vascular: Pitting edema at the lower leg bilaterally  skin: Palms without erythema Musculoskeletal: The area of pain localizes to the lower lumbar/sacrum, no mass or tenderness  Portacath/PICC-without erythema  Lab Results:  Lab Results  Component Value Date   WBC 5.4 02/16/2024   HGB 11.7 (L) 02/16/2024   HCT 35.8 (L) 02/16/2024   MCV 86.1 02/16/2024   PLT 172 02/16/2024   NEUTROABS 4.5 02/16/2024    CMP  Lab Results  Component Value Date   NA 126 (L) 02/16/2024   K 4.4 02/16/2024   CL 93 (L) 02/16/2024   CO2 28 02/16/2024   GLUCOSE 130 (H) 02/16/2024   BUN 25 (H) 02/16/2024   CREATININE 0.46 02/16/2024   CALCIUM 8.5 (L) 02/16/2024   PROT 4.5 (L) 02/16/2024   ALBUMIN 2.3 (L) 02/16/2024   AST 39 02/16/2024   ALT 25 02/16/2024   ALKPHOS 168 (H) 02/16/2024   BILITOT 0.8 02/16/2024   GFRNONAA >60 02/16/2024    Lab Results  Component Value Date   CAN199 206 (H) 01/06/2024    Lab Results  Component Value Date   INR 1.0 11/04/2023   LABPROT 13.8 11/04/2023     Imaging:  CT CHEST ABDOMEN PELVIS W CONTRAST Result Date: 02/16/2024 CLINICAL DATA:  Pancreatic cancer, low back pain * Tracking Code: BO * EXAM: CT CHEST, ABDOMEN, AND PELVIS WITH CONTRAST TECHNIQUE: Multidetector CT imaging of the chest, abdomen and pelvis was performed following the standard protocol during bolus administration of intravenous contrast. RADIATION DOSE REDUCTION: This exam was performed according to the departmental dose-optimization program which includes automated exposure control, adjustment of the mA and/or kV according to patient size and/or use of iterative reconstruction technique. CONTRAST:  80mL OMNIPAQUE IOHEXOL 300 MG/ML  SOLN COMPARISON:  CT abdomen pelvis, 11/13/2023 FINDINGS: CT CHEST FINDINGS Cardiovascular: No significant vascular findings. Normal heart size. No pericardial effusion. Mediastinum/Nodes: No enlarged mediastinal, hilar, or axillary lymph nodes. Thyroid gland, trachea, and esophagus demonstrate no significant findings. Lungs/Pleura: Lungs are clear. No pleural effusion or pneumothorax. Musculoskeletal: No chest wall abnormality. No acute osseous findings. CT ABDOMEN PELVIS FINDINGS Hepatobiliary: Interval removal of a previously seen percutaneous biliary drain with a small irregular residual hematoma or seroma in the right lobe of the liver (series 3, image 58). Status post cholecystectomy. Unchanged postoperative pneumobilia. Pancreas: Patient's known pancreatic head mass is difficult to clearly visualize, but appears grossly diminished in size (series 3, image 69). Atrophy of the pancreas with dilatation of the pancreatic duct and intraductal air (series 3, image 66). Spleen: Normal  in size without significant abnormality. Adrenals/Urinary Tract: Adrenal glands are unremarkable. Kidneys are normal, without renal calculi, solid lesion, or hydronephrosis. Bladder is unremarkable. Stomach/Bowel: Normal stomach. Unchanged position of descending duodenal stent.  Appendix appears normal. No evidence of bowel wall thickening, distention, or inflammatory changes. Evidence of small bowel resection and reanastomosis. Vascular/Lymphatic: Severe aortic atherosclerosis. No enlarged abdominal or pelvic lymph nodes. Reproductive: No mass or other abnormality. Other: No abdominal wall hernia.  Anasarca.  No ascites. Musculoskeletal: No acute osseous findings. IMPRESSION: 1. Patient's known pancreatic head mass is difficult to clearly visualize, but appears grossly diminished in size consistent with treatment response. Atrophy of the pancreas with dilatation of the pancreatic duct and intraductal air. 2. Unchanged position of descending duodenal stent. 3. Interval removal of previously seen percutaneous biliary drain, with a small irregular residual hematoma or seroma in the right lobe of the liver. 4. No evidence of lymphadenopathy or metastatic disease in the chest, abdomen, or pelvis. 5. Anasarca. Aortic Atherosclerosis (ICD10-I70.0). Electronically Signed   By: Jearld Lesch M.D.   On: 02/16/2024 13:25    Medications: I have reviewed the patient's current medications.   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  Fluid-filled and distended stomach and proximal duodenum, single 11 mm periportal lymph node EGD 07/08/2023-large food residue in the stomach, extrinsic deformity at the first portion of the duodenum-biopsied Elevated CA 19-9 CT chest 07/11/2023-scattered small pulmonary nodules-indeterminate Cycle 1 gemcitabine/Abraxane 08/05/2023 Cycle 2 gemcitabine/Abraxane 08/19/2023 Cycle 3 gemcitabine/Abraxane 09/02/2023 Cycle 4 gemcitabine/Abraxane 09/16/2023 Cycle 5  gemcitabine/Abraxane 09/30/2023 CTs 10/09/2023-increase size of pancreas mass, increased pancreatic duct caliber and new biliary dilation, bilateral lower lobe nodules are stable, hypodense left thyroid nodule Cycle 6 gemcitabine/Abraxane 10/13/2023 10/28/2023 chemotherapy held due to LFT abnormalities Biliary drain placed 11/04/2023; internal/external biliary drain 11/06/2023 Exploratory laparotomy, cholecystectomy, choledochojejunostomy 11/17/2023-bulky tumor of the pancreas head with involvement of the transverse mesocolon and probable involvement of the distal SMV-plan for pancreaticoduodenectomy aborted Case presented at GI tumor conference 12/03/2023-recommendation to proceed with capecitabine/radiation Referred to Dr. Mitzi Hansen 12/09/2023 Radiation/capecitabine 12/29/2023-02/05/2024 CTs 02/16/2024: Pancreas head mass is smaller, unchanged duodenal stent, no evidence of lymphadenopathy or metastatic disease   2.   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   Anemia 4.   Iron deficiency 5.   B12 deficiency 6.   Atrial fibrillation-maintained on Xarelto prior to hospital admission 7.  Family history of pancreas and uterine cancer 8.  Hyperlipidemia 9.  Genetics-negative      Disposition: Ms. Dack has a history of locally advanced pancreas cancer.  She completed concurrent capecitabine and radiation on 02/05/2024.  She has developed progressive low back pain over the past several weeks.  I doubt the pain is related to pancreatitis.  The lipase level was low on 02/05/2024.  The pain could be related to progression of the locally advanced tumor, but the pain is lower than typically seen with pancreas pain.  She will be referred for an urgent restaging CT evaluation.  We will specifically look for evidence of metastatic disease involving the lumbosacral spine and pelvis.  Ms. Lumbra appears dehydrated.  She will receive intravenous fluids  and narcotic analgesics at the Cancer center today.  We will consider hospital admission and further  evaluation depending on the CT findings.  Wendy Papas, MD  02/16/2024  1:50 PM  Addendum: The restaging CTs reveal no evidence of metastatic disease.  No explanation for the back pain.  She received intravenous fluids and feels better.  She will return for fluids tomorrow.  She will begin a trial of hydromorphone for pain.  We will see her later this week.  I will refer her for a PET scan if she has persistent pain.

## 2024-02-16 NOTE — Progress Notes (Signed)
 Per Dr. Truett Perna: D/C Benazepril

## 2024-02-16 NOTE — Progress Notes (Signed)
 Per Dr. Truett Perna: Needs 3 hours IVF tomorrow with CMP. Orders placed and scheduler and charge nurse notified.

## 2024-02-17 ENCOUNTER — Inpatient Hospital Stay: Payer: Medicare Other

## 2024-02-17 VITALS — BP 117/66 | HR 90 | Temp 97.3°F | Resp 14

## 2024-02-17 DIAGNOSIS — C250 Malignant neoplasm of head of pancreas: Secondary | ICD-10-CM | POA: Diagnosis not present

## 2024-02-17 DIAGNOSIS — M545 Low back pain, unspecified: Secondary | ICD-10-CM | POA: Diagnosis not present

## 2024-02-17 LAB — CMP (CANCER CENTER ONLY)
ALT: 27 U/L (ref 0–44)
AST: 43 U/L — ABNORMAL HIGH (ref 15–41)
Albumin: 2.4 g/dL — ABNORMAL LOW (ref 3.5–5.0)
Alkaline Phosphatase: 178 U/L — ABNORMAL HIGH (ref 38–126)
Anion gap: 5 (ref 5–15)
BUN: 24 mg/dL — ABNORMAL HIGH (ref 8–23)
CO2: 27 mmol/L (ref 22–32)
Calcium: 7.9 mg/dL — ABNORMAL LOW (ref 8.9–10.3)
Chloride: 94 mmol/L — ABNORMAL LOW (ref 98–111)
Creatinine: 0.46 mg/dL (ref 0.44–1.00)
GFR, Estimated: >60 mL/min (ref >60)
Glucose, Bld: 156 mg/dL — ABNORMAL HIGH (ref 70–99)
Potassium: 4.4 mmol/L (ref 3.5–5.1)
Sodium: 126 mmol/L — ABNORMAL LOW (ref 135–145)
Total Bilirubin: 0.7 mg/dL (ref 0.0–1.2)
Total Protein: 4.2 g/dL — ABNORMAL LOW (ref 6.5–8.1)

## 2024-02-17 MED ORDER — HEPARIN SOD (PORK) LOCK FLUSH 100 UNIT/ML IV SOLN
500.0000 [IU] | Freq: Once | INTRAVENOUS | Status: AC | PRN
  Administered 2024-02-17: 500 [IU]

## 2024-02-17 MED ORDER — SODIUM CHLORIDE 0.9% FLUSH
10.0000 mL | INTRAVENOUS | Status: DC | PRN
  Administered 2024-02-17: 10 mL

## 2024-02-17 MED ORDER — SODIUM CHLORIDE 0.9 % IV SOLN: INTRAVENOUS | Status: AC

## 2024-02-19 ENCOUNTER — Other Ambulatory Visit: Payer: Self-pay | Admitting: *Deleted

## 2024-02-19 ENCOUNTER — Inpatient Hospital Stay: Payer: Medicare Other

## 2024-02-19 ENCOUNTER — Inpatient Hospital Stay (HOSPITAL_BASED_OUTPATIENT_CLINIC_OR_DEPARTMENT_OTHER): Payer: Medicare Other | Admitting: Oncology

## 2024-02-19 VITALS — BP 100/68 | HR 122 | Temp 98.1°F | Resp 18 | Ht 66.0 in | Wt 116.0 lb

## 2024-02-19 VITALS — BP 108/63 | HR 86 | Resp 18

## 2024-02-19 DIAGNOSIS — C250 Malignant neoplasm of head of pancreas: Secondary | ICD-10-CM

## 2024-02-19 DIAGNOSIS — Z95828 Presence of other vascular implants and grafts: Secondary | ICD-10-CM

## 2024-02-19 DIAGNOSIS — M545 Low back pain, unspecified: Secondary | ICD-10-CM | POA: Diagnosis not present

## 2024-02-19 LAB — CMP (CANCER CENTER ONLY)
ALT: 28 U/L (ref 0–44)
AST: 45 U/L — ABNORMAL HIGH (ref 15–41)
Albumin: 2.5 g/dL — ABNORMAL LOW (ref 3.5–5.0)
Alkaline Phosphatase: 222 U/L — ABNORMAL HIGH (ref 38–126)
Anion gap: 7 (ref 5–15)
BUN: 21 mg/dL (ref 8–23)
CO2: 25 mmol/L (ref 22–32)
Calcium: 8.1 mg/dL — ABNORMAL LOW (ref 8.9–10.3)
Chloride: 93 mmol/L — ABNORMAL LOW (ref 98–111)
Creatinine: 0.46 mg/dL (ref 0.44–1.00)
GFR, Estimated: >60 mL/min (ref >60)
Glucose, Bld: 143 mg/dL — ABNORMAL HIGH (ref 70–99)
Potassium: 4.2 mmol/L (ref 3.5–5.1)
Sodium: 125 mmol/L — ABNORMAL LOW (ref 135–145)
Total Bilirubin: 0.7 mg/dL (ref 0.0–1.2)
Total Protein: 4.4 g/dL — ABNORMAL LOW (ref 6.5–8.1)

## 2024-02-19 LAB — MAGNESIUM: Magnesium: 1.5 mg/dL — ABNORMAL LOW (ref 1.7–2.4)

## 2024-02-19 MED ORDER — SODIUM CHLORIDE 0.9 % IV SOLN
INTRAVENOUS | Status: DC
Start: 2024-02-19 — End: 2024-02-19

## 2024-02-19 MED ORDER — MORPHINE SULFATE (PF) 2 MG/ML IV SOLN
2.0000 mg | Freq: Once | INTRAVENOUS | Status: AC
  Administered 2024-02-19: 2 mg via INTRAVENOUS
  Filled 2024-02-19: qty 1

## 2024-02-19 MED ORDER — MAGNESIUM SULFATE 2 GM/50ML IV SOLN
2.0000 g | Freq: Once | INTRAVENOUS | Status: AC
  Administered 2024-02-19: 2 g via INTRAVENOUS
  Filled 2024-02-19: qty 50

## 2024-02-19 MED ORDER — SODIUM CHLORIDE 0.9 % IV SOLN: INTRAVENOUS | Status: DC

## 2024-02-19 MED ORDER — SODIUM CHLORIDE 0.9% FLUSH
10.0000 mL | INTRAVENOUS | Status: DC | PRN
  Administered 2024-02-19: 10 mL

## 2024-02-19 MED ORDER — SODIUM CHLORIDE 0.9% FLUSH
10.0000 mL | INTRAVENOUS | Status: DC | PRN
Start: 2024-02-19 — End: 2024-02-19
  Administered 2024-02-19: 10 mL via INTRAVENOUS

## 2024-02-19 MED ORDER — HEPARIN SOD (PORK) LOCK FLUSH 100 UNIT/ML IV SOLN
500.0000 [IU] | Freq: Once | INTRAVENOUS | Status: AC | PRN
  Administered 2024-02-19: 500 [IU]

## 2024-02-19 NOTE — Progress Notes (Signed)
 Patient was unable to give urine output for urinalysis and urine sodium after 2 attempts. She was discharged with specimen cup and hat. Her daughter will bring specimen when collected.

## 2024-02-19 NOTE — Progress Notes (Signed)
  [facility] OFF[facility] PROGRESS NOTE   Diagnosis: Pancreas cancer  INTERVAL HISTORY:   Wendy [name] returns as scheduled.  She is here today with her daughter and son-in-law.  She continues to feel weak.  She reports hurting "all over ".  She took 1 tramadol tablet yesterday.  Her appetite remains poor.  She has intermittent nausea and vomiting. She had a bowel movement yesterday.  She received intravenous fluids 02/16/2024 and 02/17/2024.    Objective:  Vital signs in last 24 hours:  Blood pressure 98/74, pulse (!) 113, temperature 98.1 F (36.7 C), temperature source Temporal, resp. rate 18, height 5\' 6"  (1.676 m), weight 116 lb (52.6 kg), SpO2 99%.    HEENT: The mouth is dry, no thrush Resp: Lungs clear bilaterally Cardio: Irregular GI: No hepatosplenomegaly, no mass, nontender Vascular: Trace edema to lower leg bilaterally  Portacath/PICC-without erythema  Lab Results:  Lab Results  Component Value Date   WBC 5.4 02/16/2024   HGB 11.7 (L) 02/16/2024   HCT 35.8 (L) 02/16/2024   MCV 86.1 02/16/2024   PLT 172 02/16/2024   NEUTROABS 4.5 02/16/2024    CMP  Lab Results  Component Value Date   NA 126 (L) 02/17/2024   K 4.4 02/17/2024   CL 94 (L) 02/17/2024   CO2 27 02/17/2024   GLUCOSE 156 (H) 02/17/2024   BUN 24 (H) 02/17/2024   CREATININE 0.46 02/17/2024   CALCIUM 7.9 (L) 02/17/2024   PROT 4.2 (L) 02/17/2024   ALBUMIN 2.4 (L) 02/17/2024   AST 43 (H) 02/17/2024   ALT 27 02/17/2024   ALKPHOS 178 (H) 02/17/2024   BILITOT 0.7 02/17/2024   GFRNONAA >60 02/17/2024    Lab Results  Component Value Date   CAN199 206 (H) 01/06/2024    Lab Results  Component Value Date   INR 1.0 11/04/2023   LABPROT 13.8 11/04/2023    Imaging:  CT CHEST ABDOMEN PELVIS W CONTRAST Result Date: 02/16/2024 CLINICAL DATA:  Pancreatic cancer, low back pain * Tracking Code: BO * EXAM: CT CHEST, ABDOMEN, AND PELVIS WITH CONTRAST TECHNIQUE: Multidetector CT imaging of  the chest, abdomen and pelvis was performed following the standard protocol during bolus administration of intravenous contrast. RADIATION DOSE REDUCTION: This exam was performed according to the departmental dose-optimization program which includes automated exposure control, adjustment of the mA and/or kV according to patient size and/or use of iterative reconstruction technique. CONTRAST:  80mL OMNIPAQUE IOHEXOL 300 MG/ML  SOLN COMPARISON:  CT abdomen pelvis, 11/13/2023 FINDINGS: CT CHEST FINDINGS Cardiovascular: No significant vascular findings. Normal heart size. No pericardial effusion. Mediastinum/Nodes: No enlarged mediastinal, hilar, or axillary lymph nodes. Thyroid gland, trachea, and esophagus demonstrate no significant findings. Lungs/Pleura: Lungs are clear. No pleural effusion or pneumothorax. Musculoskeletal: No chest wall abnormality. No acute osseous findings. CT ABDOMEN PELVIS FINDINGS Hepatobiliary: Interval removal of a previously seen percutaneous biliary drain with a small irregular residual hematoma or seroma in the right lobe of the liver (series 3, image 58). Status post cholecystectomy. Unchanged postoperative pneumobilia. Pancreas: Patient's known pancreatic head mass is difficult to clearly visualize, but appears grossly diminished in size (series 3, image 69). Atrophy of the pancreas with dilatation of the pancreatic duct and intraductal air (series 3, image 66). Spleen: Normal in size without significant abnormality. Adrenals/Urinary Tract: Adrenal glands are unremarkable. Kidneys are normal, without renal calculi, solid lesion, or hydronephrosis. Bladder is unremarkable. Stomach/Bowel: Normal stomach. Unchanged position of descending duodenal stent. Appendix appears normal. No evidence of bowel wall  thickening, distention, or inflammatory changes. Evidence of small bowel resection and reanastomosis. Vascular/Lymphatic: Severe aortic atherosclerosis. No enlarged abdominal or pelvic  lymph nodes. Reproductive: No mass or other abnormality. Other: No abdominal wall hernia.  Anasarca.  No ascites. Musculoskeletal: No acute osseous findings. IMPRESSION: 1. Patient's known pancreatic head mass is difficult to clearly visualize, but appears grossly diminished in size consistent with treatment response. Atrophy of the pancreas with dilatation of the pancreatic duct and intraductal air. 2. Unchanged position of descending duodenal stent. 3. Interval removal of previously seen percutaneous biliary drain, with a small irregular residual hematoma or seroma in the right lobe of the liver. 4. No evidence of lymphadenopathy or metastatic disease in the chest, abdomen, or pelvis. 5. Anasarca. Aortic Atherosclerosis (ICD10-I70.0). Electronically Signed   By: Jearld Lesch M.D.   On: 02/16/2024 13:25    Medications: I have reviewed the patient's current medications.   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  Fluid-filled and distended stomach and proximal duodenum, single 11 mm periportal lymph node EGD 07/08/2023-large food residue in the stomach, extrinsic deformity at the first portion of the duodenum-biopsied Elevated CA 19-9 CT chest 07/11/2023-scattered small pulmonary nodules-indeterminate Cycle 1 gemcitabine/Abraxane 08/05/2023 Cycle 2 gemcitabine/Abraxane 08/19/2023 Cycle 3 gemcitabine/Abraxane 09/02/2023 Cycle 4 gemcitabine/Abraxane 09/16/2023 Cycle 5 gemcitabine/Abraxane 09/30/2023 CTs 10/09/2023-increase size of pancreas mass, increased pancreatic duct caliber and new biliary dilation, bilateral lower lobe nodules are stable, hypodense left thyroid nodule Cycle 6 gemcitabine/Abraxane  10/13/2023 10/28/2023 chemotherapy held due to LFT abnormalities Biliary drain placed 11/04/2023; internal/external biliary drain 11/06/2023 Exploratory laparotomy, cholecystectomy, choledochojejunostomy 11/17/2023-bulky tumor of the pancreas head with involvement of the transverse mesocolon and probable involvement of the distal SMV-plan for pancreaticoduodenectomy aborted Case presented at GI tumor conference 12/03/2023-recommendation to proceed with capecitabine/radiation Referred to Dr. Mitzi Hansen 12/09/2023 Radiation/capecitabine 12/29/2023-02/05/2024 CTs 02/16/2024: Pancreas head mass is smaller, unchanged duodenal stent, no evidence of lymphadenopathy or metastatic disease   2.   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   Anemia 4.   Iron deficiency 5.   B12 deficiency 6.   Atrial fibrillation-maintained on Xarelto prior to hospital admission 7.  Family history of pancreas and uterine cancer 8.  Hyperlipidemia 9.  Genetics-negative    Disposition: Wendy [name] has a history of locally advanced pancreas cancer.  She continues to complain of generalized weakness and diffuse pain following completion of capecitabine/radiation.  CTs on 02/16/2024 revealed no evidence of disease progression, specifically no evidence of metastatic disease involving the spine. Wendy [name] continues to report generalized weakness and anorexia.  She has limited oral intake.  Her pain appears improved compared to when I saw her earlier this week.  I have a low clinical suspicion for pain related to metastatic pancreas cancer.  She has atrial fibrillation with a high right rate and low blood pressure.  She discontinued benazepril.  I will contact cardiology to discuss the case and see if she might benefit from rate lowering therapy.  It is possible the atrial fibrillation is contributing to her malaise.  She will receive intravenous fluids again today.  We will  check a urine sodium.  Wendy [name] will continue a diet as tolerated.  She will ambulate with assistance.  She will use  oxycodone and tramadol as needed for pain.  She will return for an office visit 02/23/2024.  [name], MD  02/19/2024  10:42 AM

## 2024-02-19 NOTE — Patient Instructions (Signed)
 Rehydration, Older Adult  Rehydration is the replacement of fluids, salts, and minerals in the body (electrolytes) that are lost during dehydration. Dehydration is when there is not enough water or other fluids in the body. This happens when you lose more fluids than you take in. People who are age 88 or older have a higher risk of dehydration than younger adults. This is because in older age, the body: Is less able to maintain the right amount of water. Does not respond to temperature changes as well. Does not get a sense of thirst as easily or quickly. Other causes include: Not drinking enough fluids. This can occur when you are ill, when you forget to drink, or when you are doing activities that require a lot of energy, especially in hot weather. Conditions that cause loss of water or other fluids. These include diarrhea, vomiting, sweating, or urinating a lot. Other illnesses, such as fever or infection. Certain medicines, such as those that remove excess fluid from the body (diuretics). Symptoms of mild or moderate dehydration may include thirst, dry lips and mouth, and dizziness. Symptoms of severe dehydration may include increased heart rate, confusion, fainting, and not urinating. In severe cases, you may need to get fluids through an IV at the hospital. For mild or moderate cases, you can usually rehydrate at home by drinking certain fluids as told by your health care provider. What are the risks? Rehydration is usually safe. Taking in too much fluid (overhydration) can be a problem but is rare. Overhydration can cause an imbalance of electrolytes in the body, kidney failure, fluid in the lungs, or a decrease in salt (sodium) levels in the body. Supplies needed: You will need an oral rehydration solution (ORS) if your health care provider tells you to use one. This is a drink to treat dehydration. It can be found in pharmacies and retail stores. How to rehydrate Fluids Follow  instructions from your health care provider about what to drink. The kind of fluid and the amount you should drink depend on your condition. In general, you should choose drinks that you prefer. If told by your health care provider, drink an ORS. Make an ORS by following instructions on the package. Start by drinking small amounts, about  cup (120 mL) every 5-10 minutes. Slowly increase how much you drink until you have taken in the amount recommended by your health care provider. Drink enough clear fluids to keep your urine pale yellow. If you were told to drink an ORS, finish it first, then start slowly drinking other clear fluids. Drink fluids such as: Water. This includes sparkling and flavored water. Drinking only water can lead to having too little sodium in your body (hyponatremia). Follow the advice of your health care provider. Water from ice chips you suck on. Fruit juice with water added to it(diluted). Sports drinks. Hot or cold herbal teas. Broth-based soups. Coffee. Milk or milk products. Food Follow instructions from your health care provider about what to eat while you rehydrate. Your health care provider may recommend that you slowly begin eating regular foods in small amounts. Eat foods that contain a healthy balance of electrolytes, such as bananas, oranges, potatoes, tomatoes, and spinach. Avoid foods that are greasy or contain a lot of sugar. In some cases, you may get nutrition through a feeding tube that is passed through your nose and into your stomach (nasogastric tube, or NG tube). This may be done if you have uncontrolled vomiting or diarrhea. Drinks to avoid  Certain drinks may make dehydration worse. While you rehydrate, avoid drinking alcohol. How to tell if you are recovering from dehydration You may be getting better if: You are urinating more often than before you started rehydrating. Your urine is pale yellow. Your energy level improves. You vomit less  often. You have diarrhea less often. Your appetite improves or returns to normal. You feel less dizzy or light-headed. Your skin tone and color start to look more normal. Follow these instructions at home: Take over-the-counter and prescription medicines only as told by your health care provider. Do not take sodium tablets. Doing this can lead to having too much sodium in your body (hypernatremia). Contact a health care provider if: You continue to have symptoms of mild or moderate dehydration, such as: Thirst. Dry lips. Slightly dry mouth. Dizziness. Dark urine or less urine than usual. Muscle cramps. You continue to vomit or have diarrhea. Get help right away if: You have symptoms of dehydration that get worse. You have a fever. You have a severe headache. You have been vomiting and have problems, such as: Your vomiting gets worse. Your vomit includes blood or green matter (bile). You cannot eat or drink without vomiting. You have problems with urination or bowel movements, such as: Diarrhea that gets worse. Blood in your stool (feces). This may cause stool to look black and tarry. Not urinating, or urinating only a small amount of very dark urine, within 6-8 hours. You have trouble breathing. You have symptoms that get worse with treatment. These symptoms may be an emergency. Get help right away. Call 911. Do not wait to see if the symptoms will go away. Do not drive yourself to the hospital. This information is not intended to replace advice given to you by your health care provider. Make sure you discuss any questions you have with your health care provider. Magnesium Sulfate Injection What is this medication? MAGNESIUM SULFATE (mag NEE zee um SUL fate) prevents and treats low levels of magnesium in your body. It may also be used to prevent and treat seizures during pregnancy in people with high blood pressure disorders, such as preeclampsia or eclampsia. Magnesium plays an  important role in maintaining the health of your muscles and nervous system. This medicine may be used for other purposes; ask your health care provider or pharmacist if you have questions. What should I tell my care team before I take this medication? They need to know if you have any of these conditions: Heart disease History of irregular heart beat Kidney disease An unusual or allergic reaction to magnesium sulfate, medications, foods, dyes, or preservatives Pregnant or trying to get pregnant Breast-feeding How should I use this medication? This medication is for infusion into a vein. It is given in a hospital or clinic setting. Talk to your care team about the use of this medication in children. While this medication may be prescribed for selected conditions, precautions do apply. Overdosage: If you think you have taken too much of this medicine contact a poison control center or emergency room at once. NOTE: This medicine is only for you. Do not share this medicine with others. What if I miss a dose? This does not apply. What may interact with this medication? Certain medications for anxiety or sleep Certain medications for seizures, such phenobarbital Digoxin Medications that relax muscles for surgery Narcotic medications for pain This list may not describe all possible interactions. Give your health care provider a list of all the medicines, herbs, non-prescription  drugs, or dietary supplements you use. Also tell them if you smoke, drink alcohol, or use illegal drugs. Some items may interact with your medicine. What should I watch for while using this medication? Your condition will be monitored carefully while you are receiving this medication. You may need blood work done while you are receiving this medication. What side effects may I notice from receiving this medication? Side effects that you should report to your care team as soon as possible: Allergic reactions--skin rash,  itching, hives, swelling of the face, lips, tongue, or throat High magnesium level--confusion, drowsiness, facial flushing, redness, sweating, muscle weakness, fast or irregular heartbeat, trouble breathing Low blood pressure--dizziness, feeling faint or lightheaded, blurry vision Side effects that usually do not require medical attention (report to your care team if they continue or are bothersome): Headache Nausea This list may not describe all possible side effects. Call your doctor for medical advice about side effects. You may report side effects to FDA at 1-800-FDA-1088. Where should I keep my medication? This medication is given in a hospital or clinic and will not be stored at home. NOTE: This sheet is a summary. It may not cover all possible information. If you have questions about this medicine, talk to your doctor, pharmacist, or health care provider.  2024 Elsevier/Gold Standard (2021-08-29 00:00:00) Document Revised: 05/01/2022 Document Reviewed: 04/29/2022 Elsevier Patient Education  2024 [employer].

## 2024-02-20 ENCOUNTER — Telehealth: Payer: Self-pay | Admitting: *Deleted

## 2024-02-20 DIAGNOSIS — E860 Dehydration: Secondary | ICD-10-CM | POA: Diagnosis not present

## 2024-02-20 DIAGNOSIS — E43 Unspecified severe protein-calorie malnutrition: Secondary | ICD-10-CM | POA: Diagnosis not present

## 2024-02-20 DIAGNOSIS — B9620 Unspecified Escherichia coli [E. coli] as the cause of diseases classified elsewhere: Secondary | ICD-10-CM | POA: Diagnosis not present

## 2024-02-20 DIAGNOSIS — R0600 Dyspnea, unspecified: Secondary | ICD-10-CM | POA: Diagnosis not present

## 2024-02-20 DIAGNOSIS — N390 Urinary tract infection, site not specified: Secondary | ICD-10-CM | POA: Diagnosis not present

## 2024-02-20 DIAGNOSIS — E785 Hyperlipidemia, unspecified: Secondary | ICD-10-CM | POA: Diagnosis present

## 2024-02-20 DIAGNOSIS — R0609 Other forms of dyspnea: Secondary | ICD-10-CM | POA: Diagnosis not present

## 2024-02-20 DIAGNOSIS — R4182 Altered mental status, unspecified: Secondary | ICD-10-CM | POA: Diagnosis not present

## 2024-02-20 DIAGNOSIS — R531 Weakness: Secondary | ICD-10-CM | POA: Diagnosis not present

## 2024-02-20 DIAGNOSIS — I358 Other nonrheumatic aortic valve disorders: Secondary | ICD-10-CM | POA: Diagnosis present

## 2024-02-20 DIAGNOSIS — R404 Transient alteration of awareness: Secondary | ICD-10-CM | POA: Diagnosis not present

## 2024-02-20 DIAGNOSIS — E538 Deficiency of other specified B group vitamins: Secondary | ICD-10-CM | POA: Diagnosis present

## 2024-02-20 DIAGNOSIS — I5022 Chronic systolic (congestive) heart failure: Secondary | ICD-10-CM | POA: Diagnosis not present

## 2024-02-20 DIAGNOSIS — Z95828 Presence of other vascular implants and grafts: Secondary | ICD-10-CM | POA: Diagnosis not present

## 2024-02-20 DIAGNOSIS — C250 Malignant neoplasm of head of pancreas: Secondary | ICD-10-CM | POA: Diagnosis not present

## 2024-02-20 DIAGNOSIS — Z7189 Other specified counseling: Secondary | ICD-10-CM | POA: Diagnosis not present

## 2024-02-20 DIAGNOSIS — E876 Hypokalemia: Secondary | ICD-10-CM | POA: Diagnosis present

## 2024-02-20 DIAGNOSIS — E041 Nontoxic single thyroid nodule: Secondary | ICD-10-CM | POA: Diagnosis present

## 2024-02-20 DIAGNOSIS — D630 Anemia in neoplastic disease: Secondary | ICD-10-CM | POA: Diagnosis present

## 2024-02-20 DIAGNOSIS — Z66 Do not resuscitate: Secondary | ICD-10-CM | POA: Diagnosis not present

## 2024-02-20 DIAGNOSIS — K831 Obstruction of bile duct: Secondary | ICD-10-CM | POA: Diagnosis not present

## 2024-02-20 DIAGNOSIS — Z7401 Bed confinement status: Secondary | ICD-10-CM | POA: Diagnosis not present

## 2024-02-20 DIAGNOSIS — I5021 Acute systolic (congestive) heart failure: Secondary | ICD-10-CM | POA: Diagnosis not present

## 2024-02-20 DIAGNOSIS — K219 Gastro-esophageal reflux disease without esophagitis: Secondary | ICD-10-CM | POA: Diagnosis not present

## 2024-02-20 DIAGNOSIS — I480 Paroxysmal atrial fibrillation: Secondary | ICD-10-CM | POA: Diagnosis not present

## 2024-02-20 DIAGNOSIS — Z87440 Personal history of urinary (tract) infections: Secondary | ICD-10-CM | POA: Diagnosis not present

## 2024-02-20 DIAGNOSIS — Z452 Encounter for adjustment and management of vascular access device: Secondary | ICD-10-CM | POA: Diagnosis not present

## 2024-02-20 DIAGNOSIS — K760 Fatty (change of) liver, not elsewhere classified: Secondary | ICD-10-CM | POA: Diagnosis not present

## 2024-02-20 DIAGNOSIS — D649 Anemia, unspecified: Secondary | ICD-10-CM | POA: Diagnosis not present

## 2024-02-20 DIAGNOSIS — C259 Malignant neoplasm of pancreas, unspecified: Secondary | ICD-10-CM | POA: Diagnosis not present

## 2024-02-20 DIAGNOSIS — K311 Adult hypertrophic pyloric stenosis: Secondary | ICD-10-CM | POA: Diagnosis not present

## 2024-02-20 DIAGNOSIS — I509 Heart failure, unspecified: Secondary | ICD-10-CM | POA: Diagnosis not present

## 2024-02-20 DIAGNOSIS — M7989 Other specified soft tissue disorders: Secondary | ICD-10-CM | POA: Diagnosis not present

## 2024-02-20 DIAGNOSIS — R1011 Right upper quadrant pain: Secondary | ICD-10-CM | POA: Diagnosis not present

## 2024-02-20 DIAGNOSIS — R64 Cachexia: Secondary | ICD-10-CM | POA: Diagnosis not present

## 2024-02-20 DIAGNOSIS — Z1611 Resistance to penicillins: Secondary | ICD-10-CM | POA: Diagnosis not present

## 2024-02-20 DIAGNOSIS — E871 Hypo-osmolality and hyponatremia: Secondary | ICD-10-CM | POA: Diagnosis not present

## 2024-02-20 DIAGNOSIS — R601 Generalized edema: Secondary | ICD-10-CM | POA: Diagnosis not present

## 2024-02-20 DIAGNOSIS — K8689 Other specified diseases of pancreas: Secondary | ICD-10-CM | POA: Diagnosis not present

## 2024-02-20 DIAGNOSIS — Z515 Encounter for palliative care: Secondary | ICD-10-CM | POA: Diagnosis not present

## 2024-02-20 DIAGNOSIS — E8809 Other disorders of plasma-protein metabolism, not elsewhere classified: Secondary | ICD-10-CM | POA: Diagnosis present

## 2024-02-20 DIAGNOSIS — R230 Cyanosis: Secondary | ICD-10-CM | POA: Diagnosis not present

## 2024-02-20 DIAGNOSIS — R627 Adult failure to thrive: Secondary | ICD-10-CM | POA: Diagnosis not present

## 2024-02-20 DIAGNOSIS — I5023 Acute on chronic systolic (congestive) heart failure: Secondary | ICD-10-CM | POA: Diagnosis not present

## 2024-02-20 DIAGNOSIS — I110 Hypertensive heart disease with heart failure: Secondary | ICD-10-CM | POA: Diagnosis not present

## 2024-02-20 LAB — URINALYSIS, COMPLETE (UACMP) WITH MICROSCOPIC
Bacteria, UA: NONE SEEN
Bilirubin Urine: NEGATIVE
Glucose, UA: NEGATIVE mg/dL
Hgb urine dipstick: NEGATIVE
Ketones, ur: NEGATIVE mg/dL
Leukocytes,Ua: NEGATIVE
Nitrite: NEGATIVE
Protein, ur: NEGATIVE mg/dL
Specific Gravity, Urine: 1.02 (ref 1.005–1.030)
pH: 6 (ref 5.0–8.0)

## 2024-02-20 LAB — SODIUM, URINE, RANDOM: Sodium, Ur: 10 mmol/L

## 2024-02-20 MED ORDER — SODIUM CHLORIDE 1 G PO TABS: 1.0000 g | ORAL_TABLET | Freq: Two times a day (BID) | ORAL | 1 refills | Status: DC

## 2024-02-20 NOTE — Addendum Note (Signed)
 Addended by: Wandalee Ferdinand on: 02/20/2024 01:11 PM   Modules accepted: Orders

## 2024-02-20 NOTE — Telephone Encounter (Signed)
 Called daughter to let her know that Dr. [name] did not think the atrial fibrillation is causing her issues. Per Dr. Truett Perna, sent in script to [facility] for sodium chloride 1 gram tablets to take bid. Daughter will start this today.

## 2024-02-20 NOTE — Telephone Encounter (Signed)
 Son-in-law, Tammy Sours brought urine specimen in and asking if Dr. Truett Perna had conversation with Dr. [name] yet. Also reports she has still been very lethargic and has to be awaked to eat/drink. Has only taken one pain pill since yesterday, so not sedated from this. Informed him that a message has been left for him to call Dr. Truett Perna.  Called daughter and informed her the UA is clear. Will take time for the urine sodium to return. They agree to take her to hospital if she does not perk up by later today.

## 2024-02-23 ENCOUNTER — Inpatient Hospital Stay: Payer: Medicare Other

## 2024-02-23 ENCOUNTER — Inpatient Hospital Stay (HOSPITAL_BASED_OUTPATIENT_CLINIC_OR_DEPARTMENT_OTHER): Payer: Medicare Other | Admitting: Nurse Practitioner

## 2024-02-23 ENCOUNTER — Encounter (HOSPITAL_COMMUNITY): Payer: Self-pay

## 2024-02-23 ENCOUNTER — Other Ambulatory Visit: Payer: Self-pay

## 2024-02-23 ENCOUNTER — Inpatient Hospital Stay (HOSPITAL_COMMUNITY)
Admit: 2024-02-23 | Discharge: 2024-03-01 | DRG: 291 | Disposition: A | Discharge status: Hospice | Payer: Medicare Other | Source: Ambulatory Visit | Attending: Internal Medicine | Admitting: Internal Medicine

## 2024-02-23 ENCOUNTER — Inpatient Hospital Stay (HOSPITAL_COMMUNITY): Payer: Medicare Other

## 2024-02-23 ENCOUNTER — Other Ambulatory Visit: Payer: Self-pay | Admitting: *Deleted

## 2024-02-23 VITALS — BP 109/66 | HR 111 | Resp 18 | Ht 66.0 in | Wt 125.0 lb

## 2024-02-23 DIAGNOSIS — E611 Iron deficiency: Secondary | ICD-10-CM | POA: Diagnosis present

## 2024-02-23 DIAGNOSIS — K311 Adult hypertrophic pyloric stenosis: Secondary | ICD-10-CM | POA: Diagnosis present

## 2024-02-23 DIAGNOSIS — R230 Cyanosis: Secondary | ICD-10-CM | POA: Diagnosis present

## 2024-02-23 DIAGNOSIS — R64 Cachexia: Secondary | ICD-10-CM | POA: Diagnosis present

## 2024-02-23 DIAGNOSIS — E876 Hypokalemia: Secondary | ICD-10-CM | POA: Diagnosis present

## 2024-02-23 DIAGNOSIS — Z8049 Family history of malignant neoplasm of other genital organs: Secondary | ICD-10-CM

## 2024-02-23 DIAGNOSIS — K831 Obstruction of bile duct: Secondary | ICD-10-CM | POA: Diagnosis present

## 2024-02-23 DIAGNOSIS — Z1611 Resistance to penicillins: Secondary | ICD-10-CM | POA: Diagnosis not present

## 2024-02-23 DIAGNOSIS — N390 Urinary tract infection, site not specified: Secondary | ICD-10-CM | POA: Diagnosis not present

## 2024-02-23 DIAGNOSIS — Z923 Personal history of irradiation: Secondary | ICD-10-CM

## 2024-02-23 DIAGNOSIS — K8689 Other specified diseases of pancreas: Secondary | ICD-10-CM | POA: Diagnosis not present

## 2024-02-23 DIAGNOSIS — K219 Gastro-esophageal reflux disease without esophagitis: Secondary | ICD-10-CM | POA: Diagnosis not present

## 2024-02-23 DIAGNOSIS — R54 Age-related physical debility: Secondary | ICD-10-CM | POA: Diagnosis present

## 2024-02-23 DIAGNOSIS — R4182 Altered mental status, unspecified: Secondary | ICD-10-CM | POA: Diagnosis not present

## 2024-02-23 DIAGNOSIS — I480 Paroxysmal atrial fibrillation: Secondary | ICD-10-CM | POA: Diagnosis present

## 2024-02-23 DIAGNOSIS — E43 Unspecified severe protein-calorie malnutrition: Secondary | ICD-10-CM | POA: Diagnosis present

## 2024-02-23 DIAGNOSIS — C259 Malignant neoplasm of pancreas, unspecified: Secondary | ICD-10-CM | POA: Diagnosis not present

## 2024-02-23 DIAGNOSIS — C250 Malignant neoplasm of head of pancreas: Secondary | ICD-10-CM

## 2024-02-23 DIAGNOSIS — D630 Anemia in neoplastic disease: Secondary | ICD-10-CM | POA: Diagnosis present

## 2024-02-23 DIAGNOSIS — D649 Anemia, unspecified: Secondary | ICD-10-CM | POA: Diagnosis not present

## 2024-02-23 DIAGNOSIS — Z800 Family history of malignant neoplasm of digestive organs: Secondary | ICD-10-CM

## 2024-02-23 DIAGNOSIS — R531 Weakness: Secondary | ICD-10-CM

## 2024-02-23 DIAGNOSIS — E860 Dehydration: Secondary | ICD-10-CM | POA: Diagnosis present

## 2024-02-23 DIAGNOSIS — R601 Generalized edema: Secondary | ICD-10-CM

## 2024-02-23 DIAGNOSIS — R1011 Right upper quadrant pain: Secondary | ICD-10-CM | POA: Diagnosis not present

## 2024-02-23 DIAGNOSIS — E538 Deficiency of other specified B group vitamins: Secondary | ICD-10-CM | POA: Diagnosis present

## 2024-02-23 DIAGNOSIS — T451X5A Adverse effect of antineoplastic and immunosuppressive drugs, initial encounter: Secondary | ICD-10-CM | POA: Diagnosis present

## 2024-02-23 DIAGNOSIS — D6481 Anemia due to antineoplastic chemotherapy: Secondary | ICD-10-CM | POA: Diagnosis present

## 2024-02-23 DIAGNOSIS — Z452 Encounter for adjustment and management of vascular access device: Secondary | ICD-10-CM | POA: Diagnosis not present

## 2024-02-23 DIAGNOSIS — I358 Other nonrheumatic aortic valve disorders: Secondary | ICD-10-CM | POA: Diagnosis present

## 2024-02-23 DIAGNOSIS — Z95828 Presence of other vascular implants and grafts: Secondary | ICD-10-CM | POA: Diagnosis not present

## 2024-02-23 DIAGNOSIS — R0609 Other forms of dyspnea: Secondary | ICD-10-CM | POA: Diagnosis not present

## 2024-02-23 DIAGNOSIS — Z7401 Bed confinement status: Secondary | ICD-10-CM | POA: Diagnosis not present

## 2024-02-23 DIAGNOSIS — I5023 Acute on chronic systolic (congestive) heart failure: Secondary | ICD-10-CM | POA: Diagnosis present

## 2024-02-23 DIAGNOSIS — E8809 Other disorders of plasma-protein metabolism, not elsewhere classified: Secondary | ICD-10-CM | POA: Diagnosis present

## 2024-02-23 DIAGNOSIS — E785 Hyperlipidemia, unspecified: Secondary | ICD-10-CM | POA: Diagnosis present

## 2024-02-23 DIAGNOSIS — Z6820 Body mass index (BMI) 20.0-20.9, adult: Secondary | ICD-10-CM

## 2024-02-23 DIAGNOSIS — R627 Adult failure to thrive: Secondary | ICD-10-CM | POA: Diagnosis present

## 2024-02-23 DIAGNOSIS — M549 Dorsalgia, unspecified: Secondary | ICD-10-CM | POA: Diagnosis present

## 2024-02-23 DIAGNOSIS — Z885 Allergy status to narcotic agent status: Secondary | ICD-10-CM

## 2024-02-23 DIAGNOSIS — I5022 Chronic systolic (congestive) heart failure: Secondary | ICD-10-CM | POA: Diagnosis not present

## 2024-02-23 DIAGNOSIS — E041 Nontoxic single thyroid nodule: Secondary | ICD-10-CM | POA: Diagnosis present

## 2024-02-23 DIAGNOSIS — Z515 Encounter for palliative care: Secondary | ICD-10-CM | POA: Diagnosis not present

## 2024-02-23 DIAGNOSIS — I509 Heart failure, unspecified: Secondary | ICD-10-CM | POA: Diagnosis present

## 2024-02-23 DIAGNOSIS — Z801 Family history of malignant neoplasm of trachea, bronchus and lung: Secondary | ICD-10-CM

## 2024-02-23 DIAGNOSIS — K760 Fatty (change of) liver, not elsewhere classified: Secondary | ICD-10-CM | POA: Diagnosis not present

## 2024-02-23 DIAGNOSIS — I110 Hypertensive heart disease with heart failure: Secondary | ICD-10-CM | POA: Diagnosis present

## 2024-02-23 DIAGNOSIS — M7989 Other specified soft tissue disorders: Secondary | ICD-10-CM | POA: Diagnosis not present

## 2024-02-23 DIAGNOSIS — Z66 Do not resuscitate: Secondary | ICD-10-CM | POA: Diagnosis present

## 2024-02-23 DIAGNOSIS — E559 Vitamin D deficiency, unspecified: Secondary | ICD-10-CM | POA: Diagnosis present

## 2024-02-23 DIAGNOSIS — E871 Hypo-osmolality and hyponatremia: Secondary | ICD-10-CM | POA: Diagnosis present

## 2024-02-23 DIAGNOSIS — Z79899 Other long term (current) drug therapy: Secondary | ICD-10-CM

## 2024-02-23 DIAGNOSIS — B9620 Unspecified Escherichia coli [E. coli] as the cause of diseases classified elsewhere: Secondary | ICD-10-CM | POA: Diagnosis not present

## 2024-02-23 DIAGNOSIS — R0600 Dyspnea, unspecified: Secondary | ICD-10-CM | POA: Diagnosis not present

## 2024-02-23 DIAGNOSIS — R404 Transient alteration of awareness: Secondary | ICD-10-CM | POA: Diagnosis not present

## 2024-02-23 DIAGNOSIS — Z808 Family history of malignant neoplasm of other organs or systems: Secondary | ICD-10-CM

## 2024-02-23 DIAGNOSIS — Z87440 Personal history of urinary (tract) infections: Secondary | ICD-10-CM | POA: Diagnosis not present

## 2024-02-23 DIAGNOSIS — Z882 Allergy status to sulfonamides status: Secondary | ICD-10-CM

## 2024-02-23 DIAGNOSIS — I5021 Acute systolic (congestive) heart failure: Secondary | ICD-10-CM | POA: Diagnosis not present

## 2024-02-23 DIAGNOSIS — Z7189 Other specified counseling: Secondary | ICD-10-CM | POA: Diagnosis not present

## 2024-02-23 DIAGNOSIS — Z888 Allergy status to other drugs, medicaments and biological substances status: Secondary | ICD-10-CM

## 2024-02-23 LAB — CMP (CANCER CENTER ONLY)
ALT: 33 U/L (ref 0–44)
AST: 52 U/L — ABNORMAL HIGH (ref 15–41)
Albumin: 2.5 g/dL — ABNORMAL LOW (ref 3.5–5.0)
Alkaline Phosphatase: 217 U/L — ABNORMAL HIGH (ref 38–126)
Anion gap: 6 (ref 5–15)
BUN: 25 mg/dL — ABNORMAL HIGH (ref 8–23)
CO2: 26 mmol/L (ref 22–32)
Calcium: 8.3 mg/dL — ABNORMAL LOW (ref 8.9–10.3)
Chloride: 94 mmol/L — ABNORMAL LOW (ref 98–111)
Creatinine: 0.36 mg/dL — ABNORMAL LOW (ref 0.44–1.00)
GFR, Estimated: >60 mL/min (ref >60)
Glucose, Bld: 165 mg/dL — ABNORMAL HIGH (ref 70–99)
Potassium: 4.1 mmol/L (ref 3.5–5.1)
Sodium: 126 mmol/L — ABNORMAL LOW (ref 135–145)
Total Bilirubin: 0.5 mg/dL (ref 0.0–1.2)
Total Protein: 4.7 g/dL — ABNORMAL LOW (ref 6.5–8.1)

## 2024-02-23 LAB — CBC WITH DIFFERENTIAL (CANCER CENTER ONLY)
Abs Immature Granulocytes: 0.05 10*3/uL (ref 0.00–0.07)
Basophils Absolute: 0 10*3/uL (ref 0.0–0.1)
Basophils Relative: 0 %
Eosinophils Absolute: 0 10*3/uL (ref 0.0–0.5)
Eosinophils Relative: 0 %
HCT: 35.6 % — ABNORMAL LOW (ref 36.0–46.0)
Hemoglobin: 11.8 g/dL — ABNORMAL LOW (ref 12.0–15.0)
Immature Granulocytes: 1 %
Lymphocytes Relative: 5 %
Lymphs Abs: 0.4 10*3/uL — ABNORMAL LOW (ref 0.7–4.0)
MCH: 28.4 pg (ref 26.0–34.0)
MCHC: 33.1 g/dL (ref 30.0–36.0)
MCV: 85.8 fL (ref 80.0–100.0)
Monocytes Absolute: 0.4 10*3/uL (ref 0.1–1.0)
Monocytes Relative: 5 %
Neutro Abs: 6.8 10*3/uL (ref 1.7–7.7)
Neutrophils Relative %: 89 %
Platelet Count: 195 10*3/uL (ref 150–400)
RBC: 4.15 MIL/uL (ref 3.87–5.11)
RDW: 21.9 % — ABNORMAL HIGH (ref 11.5–15.5)
WBC Count: 7.7 10*3/uL (ref 4.0–10.5)
nRBC: 0 % (ref 0.0–0.2)

## 2024-02-23 LAB — BRAIN NATRIURETIC PEPTIDE: B Natriuretic Peptide: 614.7 pg/mL — ABNORMAL HIGH (ref 0.0–100.0)

## 2024-02-23 LAB — MAGNESIUM: Magnesium: 1.8 mg/dL (ref 1.7–2.4)

## 2024-02-23 LAB — SODIUM, URINE, RANDOM: Sodium, Ur: <10 mmol/L

## 2024-02-23 LAB — OSMOLALITY, URINE: Osmolality, Ur: 452 mosm/kg (ref 300–900)

## 2024-02-23 MED ORDER — FUROSEMIDE 10 MG/ML IJ SOLN
40.0000 mg | Freq: Every day | INTRAMUSCULAR | Status: DC
  Administered 2024-02-23 – 2024-02-27 (×5): 40 mg via INTRAVENOUS
  Filled 2024-02-23 (×5): qty 4

## 2024-02-23 MED ORDER — ENOXAPARIN SODIUM 40 MG/0.4ML IJ SOSY
40.0000 mg | PREFILLED_SYRINGE | INTRAMUSCULAR | Status: DC
  Administered 2024-02-23 – 2024-02-29 (×7): 40 mg via SUBCUTANEOUS
  Filled 2024-02-23 (×7): qty 0.4

## 2024-02-23 MED ORDER — POLYETHYLENE GLYCOL 3350 17 G PO PACK: 17.0000 g | PACK | Freq: Every day | ORAL | Status: DC | PRN

## 2024-02-23 MED ORDER — PROSIGHT PO TABS
1.0000 | ORAL_TABLET | Freq: Every day | ORAL | Status: DC
  Administered 2024-02-24 – 2024-02-29 (×6): 1 via ORAL
  Filled 2024-02-23 (×6): qty 1

## 2024-02-23 MED ORDER — ACETAMINOPHEN 650 MG RE SUPP: 650.0000 mg | Freq: Four times a day (QID) | RECTAL | Status: DC | PRN

## 2024-02-23 MED ORDER — SODIUM CHLORIDE 0.9% FLUSH: 10.0000 mL | INTRAVENOUS | Status: DC | PRN

## 2024-02-23 MED ORDER — TRAMADOL HCL 50 MG PO TABS
50.0000 mg | ORAL_TABLET | Freq: Four times a day (QID) | ORAL | Status: DC | PRN
  Administered 2024-02-28: 50 mg via ORAL
  Filled 2024-02-23: qty 1

## 2024-02-23 MED ORDER — DOCUSATE SODIUM 100 MG PO CAPS
100.0000 mg | ORAL_CAPSULE | Freq: Two times a day (BID) | ORAL | Status: DC
  Administered 2024-02-26 – 2024-02-29 (×6): 100 mg via ORAL
  Filled 2024-02-23 (×10): qty 1

## 2024-02-23 MED ORDER — OXYCODONE HCL 5 MG PO TABS
5.0000 mg | ORAL_TABLET | Freq: Four times a day (QID) | ORAL | Status: DC | PRN
  Administered 2024-02-23: 5 mg via ORAL
  Administered 2024-02-24 – 2024-02-26 (×2): 10 mg via ORAL
  Administered 2024-02-28: 5 mg via ORAL
  Administered 2024-02-29: 10 mg via ORAL
  Filled 2024-02-23 (×3): qty 2
  Filled 2024-02-23 (×2): qty 1
  Filled 2024-02-23: qty 2

## 2024-02-23 MED ORDER — SODIUM CHLORIDE 1 G PO TABS
1.0000 g | ORAL_TABLET | Freq: Two times a day (BID) | ORAL | Status: DC
  Administered 2024-02-23 – 2024-02-24 (×2): 1 g via ORAL
  Filled 2024-02-23 (×2): qty 1

## 2024-02-23 MED ORDER — ONDANSETRON HCL 4 MG PO TABS: 4.0000 mg | ORAL_TABLET | Freq: Four times a day (QID) | ORAL | Status: DC | PRN

## 2024-02-23 MED ORDER — FERROUS SULFATE 325 (65 FE) MG PO TABS
325.0000 mg | ORAL_TABLET | Freq: Every day | ORAL | Status: DC
  Administered 2024-02-24 – 2024-02-29 (×5): 325 mg via ORAL
  Filled 2024-02-23 (×6): qty 1

## 2024-02-23 MED ORDER — CHLORHEXIDINE GLUCONATE CLOTH 2 % EX PADS
6.0000 | MEDICATED_PAD | Freq: Every day | CUTANEOUS | Status: DC
  Administered 2024-02-24 – 2024-03-01 (×7): 6 via TOPICAL

## 2024-02-23 MED ORDER — FAMOTIDINE 20 MG PO TABS
40.0000 mg | ORAL_TABLET | Freq: Every day | ORAL | Status: DC
  Administered 2024-02-24 – 2024-02-29 (×6): 40 mg via ORAL
  Filled 2024-02-23 (×6): qty 2

## 2024-02-23 MED ORDER — ACETAMINOPHEN 325 MG PO TABS
650.0000 mg | ORAL_TABLET | Freq: Four times a day (QID) | ORAL | Status: DC | PRN
  Administered 2024-02-26 – 2024-02-28 (×3): 650 mg via ORAL
  Filled 2024-02-23 (×3): qty 2

## 2024-02-23 MED ORDER — VITAMIN B-12 1000 MCG PO TABS
1000.0000 ug | ORAL_TABLET | Freq: Every day | ORAL | Status: DC
  Administered 2024-02-24 – 2024-02-29 (×6): 1000 ug via ORAL
  Filled 2024-02-23 (×6): qty 1

## 2024-02-23 MED ORDER — SODIUM CHLORIDE 0.9% FLUSH
10.0000 mL | Freq: Two times a day (BID) | INTRAVENOUS | Status: DC
  Administered 2024-02-24 – 2024-03-01 (×6): 10 mL

## 2024-02-23 MED ORDER — SENNOSIDES-DOCUSATE SODIUM 8.6-50 MG PO TABS: 1.0000 | ORAL_TABLET | Freq: Every evening | ORAL | Status: DC | PRN

## 2024-02-23 MED ORDER — ENSURE ENLIVE PO LIQD
237.0000 mL | Freq: Three times a day (TID) | ORAL | Status: DC
  Administered 2024-02-23 – 2024-02-29 (×15): 237 mL via ORAL

## 2024-02-23 MED ORDER — ONDANSETRON HCL 4 MG/2ML IJ SOLN: 4.0000 mg | Freq: Four times a day (QID) | INTRAMUSCULAR | Status: DC | PRN

## 2024-02-23 MED ORDER — SIMVASTATIN 20 MG PO TABS
20.0000 mg | ORAL_TABLET | Freq: Every day | ORAL | Status: DC
  Administered 2024-02-24 – 2024-02-29 (×6): 20 mg via ORAL
  Filled 2024-02-23 (×6): qty 1

## 2024-02-23 NOTE — Progress Notes (Signed)
 [facility] OFFICE PROGRESS NOTE   Diagnosis: Pancreas cancer  INTERVAL HISTORY:   Wendy [name] returns as scheduled.  She has developed worsening generalized edema.  She is short of breath with exertion.  No fever.  Family reports good fluid intake, poor urine output.  Appetite in general is poor.  No nausea vomiting for the last few days.  They are concerned she is "impacted".  She has not had a bowel movement for 2 days despite multiple laxatives, including a suppository.  She reports generalized pain.  Objective:  Vital signs in last 24 hours:  Blood pressure 109/66, pulse (!) 111, resp. rate 18, height 5\' 6"  (1.676 m), weight 125 lb (56.7 kg).    HEENT: Mouth is dry appearing. Resp: Lungs clear bilaterally. Cardio: Irregular. GI: No hepatosplenomegaly. Vascular: Pitting upper extremity and lower body edema. Skin: Tiny superficial abrasions/skin dryness at the outer lower leg/ankle bilaterally. Port-A-Cath without erythema.  Lab Results:  Lab Results  Component Value Date   WBC 7.7 02/23/2024   HGB 11.8 (L) 02/23/2024   HCT 35.6 (L) 02/23/2024   MCV 85.8 02/23/2024   PLT 195 02/23/2024   NEUTROABS 6.8 02/23/2024    Imaging:  No results found.  Medications: I have reviewed the patient's current medications.  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  Fluid-filled and distended stomach and proximal duodenum, single 11 mm periportal lymph node EGD 07/08/2023-large food residue in the stomach, extrinsic deformity at the first portion of the duodenum-biopsied Elevated CA 19-9 CT chest 07/11/2023-scattered small pulmonary nodules-indeterminate Cycle 1  gemcitabine/Abraxane 08/05/2023 Cycle 2 gemcitabine/Abraxane 08/19/2023 Cycle 3 gemcitabine/Abraxane 09/02/2023 Cycle 4 gemcitabine/Abraxane 09/16/2023 Cycle 5 gemcitabine/Abraxane 09/30/2023 CTs 10/09/2023-increase size of pancreas mass, increased pancreatic duct caliber and new biliary dilation, bilateral lower lobe nodules are stable, hypodense left thyroid nodule Cycle 6 gemcitabine/Abraxane 10/13/2023 10/28/2023 chemotherapy held due to LFT abnormalities Biliary drain placed 11/04/2023; internal/external biliary drain 11/06/2023 Exploratory laparotomy, cholecystectomy, choledochojejunostomy 11/17/2023-bulky tumor of the pancreas head with involvement of the transverse mesocolon and probable involvement of the distal SMV-plan for pancreaticoduodenectomy aborted Case presented at GI tumor conference 12/03/2023-recommendation to proceed with capecitabine/radiation Referred to Dr. Mitzi Hansen 12/09/2023 Radiation/capecitabine 12/29/2023-02/05/2024 CTs 02/16/2024: Pancreas head mass is smaller, unchanged duodenal stent, no evidence of lymphadenopathy or metastatic disease   2.   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   Anemia 4.   Iron deficiency 5.   B12 deficiency 6.   Atrial fibrillation-maintained on Xarelto prior to hospital admission 7.  Family history of pancreas and uterine cancer 8.  Hyperlipidemia 9.  Genetics-negative    Disposition: Wendy [name] has locally advanced pancreas cancer.  She completed radiation/capecitabine 02/05/2024.  Her performance status has declined.  She has developed anasarca.  She appears dehydrated.  She has atrial fibrillation with an elevated heart rate.  We are making arrangements for hospital admission.    End-of-life care/CODE STATUS discussed with her and her family at today's visit.  She is undecided on CODE STATUS.  Patient seen with Dr. Truett Perna.  Lonna Cobb ANP/GNP-BC   02/23/2024  2:41  PM  This was a shared visit with Lonna Cobb.  Wendy [name] was interviewed and examined.  She has persistent failure to thrive.  She has anorexia, hypoalbuminemia, and anasarca.  She has persistent hyponatremia.  The urine sodium was low last week.  It is unclear whether her symptoms are related to atrial fibrillation/CHF or pancreas cancer.  Her current symptoms are most likely unrelated to radiation and the recent course of capecitabine.  She agrees to hospital admission for further evaluation.  I contacted the hospitalist service and she will be admitted to a telemetry bed.  We began a discussion regarding end-of-life care/CODE STATUS with Wendy [name] and her family.  She is not a candidate for further treatment of the cancer.  I was present for greater than 50% of today's visit.  I performed medical decision making.  Mancel Bale, MD

## 2024-02-23 NOTE — H&P (Incomplete)
 History and Physical  Wendy [name]:[id number] DOB: February 26, 1934 DOA: 02/23/2024  PCP: [name], MD   Chief Complaint: Generalized edema, failure to thrive  HPI: Wendy [name] is a 89 y.o. female with medical history significant for adenocarcinoma of the pancreas status post completion of radiation/capecitabine on 02/05/24, A-fib, GERD, HLD, HTN, vitamin D deficiency, anemia and chronic hyponatremia who was directly admitted by the cancer center for worsening generalized edema. Per daughter, patient had her last radiation therapy about 2 weeks ago and since then, she has had progressive edema mostly in her extremities. Her oncologist has been managing this in the outpatient however patient edema has worsened over the last week.  Reports patient was previously dehydrated but has had good p.o. intake recently but poor urine output.  Patient had constipation recently evaluated finally had a bowel movement prior to arrival to the hospital today.  Patient has had generalized weakness and fatigue but no fevers, chills, nausea, vomiting, dizziness, shortness of breath or chest pain.  On arrival, initial vitals with stable with HR of 95 and BP of 140/69.  Labs from cancer center today shows BNP 614, WBCs 7.7, Hgb 11.8, platelet 195, sodium 126 (from 125 4 days ago), glucose 165, BUN/creatinine 25/0.36, albumin 2.5, AST/ALT 52/33, alk phos 217, bilirubin 0.5, mag 1.8. EKG showed A-fib with rate of 105. CXR with no acute cardiopulmonary disease.  Review of Systems: Please see HPI for pertinent positives and negatives. A complete 10 system review of systems are otherwise negative.  Past Medical History:  Diagnosis Date  . Arthritis   . Dysrhythmia   . Family history of melanoma   . Family history of pancreatic cancer   . Family history of uterine cancer   . GERD (gastroesophageal reflux disease)   . Hyperlipidemia   . Hypertension   . pancreatic ca 06/2023   new dx  . Vitamin D deficiency     Past Surgical History:  Procedure Laterality Date  . BIOPSY  07/08/2023   Procedure: BIOPSY;  Surgeon: Vida Rigger, MD;  Location: [facility];  Service: Gastroenterology;;  . CATARACT EXTRACTION Bilateral   . CHOLECYSTECTOMY  11/17/2023   Procedure: OPEN CHOLECYSTECTOMY;  Surgeon: [name], MD;  Location: [facility] OR;  Service: General;;  . DUODENAL STENT PLACEMENT N/A 07/10/2023   Procedure: DUODENAL STENT PLACEMENT;  Surgeon: Vida Rigger, MD;  Location: [facility];  Service: Gastroenterology;  Laterality: N/A;  . ERCP Bilateral 11/04/2023   Procedure: ENDOSCOPIC RETROGRADE CHOLANGIOPANCREATOGRAPHY (ERCP);  Surgeon: Vida Rigger, MD;  Location: [facility] END[facility]COPY;  Service: Gastroenterology;  Laterality: Bilateral;  . ESOPHAGOGASTRODUODENOSCOPY (EGD) WITH PROPOFOL N/A 07/08/2023   Procedure: ESOPHAGOGASTRODUODENOSCOPY (EGD) WITH PROPOFOL;  Surgeon: Vida Rigger, MD;  Location: [facility];  Service: Gastroenterology;  Laterality: N/A;  . ESOPHAGOGASTRODUODENOSCOPY (EGD) WITH PROPOFOL N/A 07/10/2023   Procedure: ESOPHAGOGASTRODUODENOSCOPY (EGD) WITH PROPOFOL;  Surgeon: Vida Rigger, MD;  Location: [facility];  Service: Gastroenterology;  Laterality: N/A;  . GASTRIC ROUX-EN-Y  11/17/2023   Procedure: OPEN ROUX-EN-Y HEPATIC-JEJUNOSTOMY;  Surgeon: [name], MD;  Location: [facility] OR;  Service: General;;  . IR BILIARY DRAIN PLACEMENT WITH CHOLANGIOGRAM  11/04/2023  . IR CHOLANGIOGRAM EXISTING TUBE  11/12/2023  . IR CONVERT BILIARY DRAIN TO INT EXT BILIARY DRAIN  11/06/2023  . PORTACATH PLACEMENT N/A 08/04/2023   Procedure: INSERTION PORT-A-CATH;  Surgeon: [name], MD;  Location: [facility] ORS;  Service: General;  Laterality: N/A;  . WHIPPLE PROCEDURE N/A 11/17/2023   Procedure: EXPLORATORY LAPAROTOMY;  Surgeon: [name],  Ree Kida, MD;  Location: [facility] OR;  Service: General;  Laterality: N/A;   Social History:  reports that she has never smoked. She has never used smokeless tobacco. She reports that  she does not drink alcohol and does not use drugs.  Allergies  Allergen Reactions  . Bactrim [Sulfamethoxazole-Trimethoprim] Nausea And Vomiting and Other (See Comments)    "Knocked her for a loop"- dizziness and weakness, also  . Hydromorphone Other (See Comments)    Was too strong at 2 mg  . Protonix [Pantoprazole] Rash    Family History  Problem Relation Age of Onset  . Uterine cancer Mother        dx in her 13s  . Pancreatic cancer Father   . Cerebral aneurysm Sister   . Melanoma Sister 27 - 80       on her leg  . Sarcoidosis Sister 65  . Lung cancer Brother        smoker  . Pancreatic cancer Niece 68 - 53       paternal half brother's daughter  . Lung cancer Niece 71 - 41       smoker     Prior to Admission medications   Medication Sig Start Date End Date Taking? Authorizing Provider  cyanocobalamin (VITAMIN B12) 1000 MCG tablet Take 1,000 mcg by mouth in the morning.   Yes [provider]  docusate sodium (COLACE) 100 MG capsule Take 1 capsule (100 mg total) by mouth 2 (two) times daily. Patient taking differently: Take 100 mg by mouth See admin instructions. Take 100 mg by mouth in the morning and evening 07/12/23  Yes Regalado, Belkys A, MD  famotidine (PEPCID) 40 MG tablet Take 40 mg by mouth daily before breakfast. 07/18/23  Yes [provider]  ferrous sulfate 325 (65 FE) MG EC tablet Take 1 tablet (325 mg total) by mouth daily with breakfast. 07/12/23 11/13/25 Yes Regalado, Belkys A, MD  lidocaine-prilocaine (EMLA) cream APPLY 1 APPLICATION TOPICALLY AS NEEDED **APPLY TO PORT 1-2 HOURS PRIOR TO APPOINTMENT Patient taking differently: Apply 1 Application topically See admin instructions. Apply to port 1-2 hours prior to appointment 11/13/23  Yes Ladene Artist, MD  methocarbamol (ROBAXIN) 500 MG tablet Take 1 tablet (500 mg total) by mouth 3 (three) times daily as needed for muscle spasms. 12/09/23  Yes Rana Snare, NP  ondansetron (ZOFRAN) 8 MG  tablet Take 1 tablet (8 mg total) by mouth every 8 (eight) hours as needed for nausea or vomiting. 01/22/24  Yes Ladene Artist, MD  oxyCODONE (OXY IR/ROXICODONE) 5 MG immediate release tablet Take 5-10 mg by mouth every 6 (six) hours as needed (for pain).   Yes [provider]  polyethylene glycol (MIRALAX / GLYCOLAX) 17 g packet Take 17 g by mouth daily as needed for mild constipation. 07/12/23  Yes Regalado, Belkys A, MD  prochlorperazine (COMPAZINE) 5 MG tablet Take 1-2 tablets (5-10 mg total) by mouth every 6 (six) hours as needed for nausea or vomiting. 02/05/24  Yes Ladene Artist, MD  simvastatin (ZOCOR) 40 MG tablet Take 0.5 tablets (20 mg total) by mouth daily with supper. Take 1/2 pill. Patient taking differently: Take 20 mg by mouth every evening. 01/05/24  Yes [name], [name] M, MD  sodium chloride 1 g tablet Take 1 tablet (1 g total) by mouth 2 (two) times daily with a meal. 02/20/24  Yes Ladene Artist, MD  traMADol (ULTRAM) 50 MG tablet Take 1 tablet (50 mg total)  by mouth every 6 (six) hours as needed. Do not take with oxycodone Patient taking differently: Take 50 mg by mouth every 6 (six) hours as needed ("do not take with oxycodone"). 02/05/24  Yes Ladene Artist, MD    Physical Exam: BP (!) 119/56 (BP Location: Right Arm)   Pulse 100   Temp (!) 97.4 F (36.3 C) (Oral)   Resp 17   SpO2 100%  General: Pleasant, frail and cachectic elderly woman laying in bed. No acute distress. HEENT: [place]/AT. Anicteric sclera. Dry mucous membrane. Neck: Mild JVD to the mid neck. CV: Mild tachycardia. Irregularly irregular rhythm. No murmurs, rubs, or gallops. 2-3+ BLE pitting edema up to knee. 2+ BUE edema Pulmonary: Lungs CTAB. Normal effort. No wheezing or rales. Decreased breath sounds at the bases. Abdominal: Healed abdominal scar. Soft, nontender, nondistended. Normal bowel sounds. Extremities: Palpable radial and DP pulses. Generalized anasarca Skin: Warm and dry. Mild  erythema/skin abrasion around her left ankle, w/o tenderness or warmth Neuro: A&Ox3. Moves all extremities. Normal sensation to light touch. No focal deficit. Psych: Normal mood and affect          Labs on Admission:  Basic Metabolic Panel: Recent Labs  Lab 02/17/24 1255 02/19/24 1120 02/23/24 1345  NA 126* 125* 126*  K 4.4 4.2 4.1  CL 94* 93* 94*  CO2 27 25 26   GLUCOSE 156* 143* 165*  BUN 24* 21 25*  CREATININE 0.46 0.46 0.36*  CALCIUM 7.9* 8.1* 8.3*  MG  --  1.5* 1.8   Liver Function Tests: Recent Labs  Lab 02/17/24 1255 02/19/24 1120 02/23/24 1345  AST 43* 45* 52*  ALT 27 28 33  ALKPHOS 178* 222* 217*  BILITOT 0.7 0.7 0.5  PROT 4.2* 4.4* 4.7*  ALBUMIN 2.4* 2.5* 2.5*   No results for input(s): "LIPASE", "AMYLASE" in the last 168 hours. No results for input(s): "AMMONIA" in the last 168 hours. CBC: Recent Labs  Lab 02/23/24 1345  WBC 7.7  NEUTROABS 6.8  HGB 11.8*  HCT 35.6*  MCV 85.8  PLT 195   Cardiac Enzymes: No results for input(s): "CKTOTAL", "CKMB", "CKMBINDEX", "TROPONINI" in the last 168 hours. BNP (last 3 results) Recent Labs    02/23/24 1345  BNP 614.7*    ProBNP (last 3 results) No results for input(s): "PROBNP" in the last 8760 hours.  CBG: No results for input(s): "GLUCAP" in the last 168 hours.  Radiological Exams on Admission: No results found. Assessment/Plan Wendy [name] is a 89 y.o. female with medical history significant for adenocarcinoma of the pancreas status post completion of radiation/capecitabine on 02/05/24, A-fib, GERD, HLD, HTN, vitamin D deficiency, anemia and chronic hyponatremia who was directly admitted by the cancer center for worsening generalized edema and addmitted for acute CHF.  # Acute CHF Last TTE in October 2020 showed EF 55-60%, mildly dilated LA and RA and mild to moderate mitral valve regurg.  Patient recently completed radiation therapy and subsequently developed neurolyse edema that has progressed  and worsened over the last 2 weeks more pronounced in her arms and lower legs.  Her presentation likely secondary to worsening right-sided heart failure in the setting of her recent radiation exposure. -Admit to telemetry bed -IV Lasix 40 mg daily -Follow-up repeat echocardiogram -Strict I&O's, daily weights -Apply TED hose -Trend and replete electrolytes  # Advanced pancreatic cancer Diagnosed in July 2024. Plan for Whipple procedure was aborted late last year due to involvement of the distal SMV. She has now completed capecitabine/radiation  therapy but since had decreased performance status over the last few weeks.  Patient and daughter agree the patient to be DNR. -Oncology following, appreciate recs -Change CODE STATUS to DNR -Continue as needed oxycodone and tramadol for pain -Consider palliative care consult during this hospitalization  # Hypervolemic hyponatremia Review, patient has had hyponatremia since mid November 2024.  Sodium has since dropped from the low 130s to as low as 125 4 days ago. Patient hypervolemic on exam. Urine sodium significantly low at <10, which points to an extrarenal cause. In the setting of her low albumin and CHF symptoms, her hyponatremia is likely secondary to heart failure and hyperbilirubinemia. -IV diuresing as above -Follow-up urine osmolality -Continue sodium chloride tablets with meals -Check urine protein/creatinine ratio to rule out nephrotic syndrome -Trend BMP  # Anemia Hgb stable at 11.8. Likely in the setting of iron deficiency vitamin B12 deficiency. -Continue iron and vitamin B12 supplementation -Trend CBC  # GERD -Continue famotidine  # HLD -Continue simvastatin  # Protein caloric malnutrition # Generalized debility # Failure to thrive Patient has had decreasing performance status since completing radiation therapy 2 weeks ago.  DVT prophylaxis: Lovenox     Code Status: Limited: Do not attempt resuscitation (DNR)  -DNR-LIMITED -Do Not Intubate/DNI   Consults called: Oncology  Family Communication: Discussed admission with daughter at bedside  Severity of Illness: The appropriate patient status for this patient is INPATIENT. Inpatient status is judged to be reasonable and necessary in order to provide the required intensity of service to ensure the patient's safety. The patient's presenting symptoms, physical exam findings, and initial radiographic and laboratory data in the context of their chronic comorbidities is felt to place them at high risk for further clinical deterioration. Furthermore, it is not anticipated that the patient will be medically stable for discharge from the hospital within 2 midnights of admission.   * I certify that at the point of admission it is my clinical judgment that the patient will require inpatient hospital care spanning beyond 2 midnights from the point of admission due to high intensity of service, high risk for further deterioration and high frequency of surveillance required.*  Level of care: Telemetry   Steffanie Rainwater, MD 02/23/2024, 10:44 PM [facility] Pager: [phone] Isaiah 41:10   If 7PM-7AM, please contact night-coverage [email or web address] Password [email or web address]

## 2024-02-23 NOTE — H&P (Incomplete)
 History and Physical  Wendy [name] ZOX:[id number] DOB: [birth year] DOA: 02/23/2024  PCP: [name], MD   Chief Complaint: Generalized edema, failure to thrive  HPI: Wendy [name] is a 89 y.o. female with medical history significant for adenocarcinoma of the pancreas status post completion of radiation/capecitabine on 02/05/24, A-fib, GERD, HLD, HTN, vitamin D deficiency, anemia and chronic hyponatremia who was directly admitted by the cancer center for worsening generalized edema. Per daughter, patient had her last radiation therapy about 2 weeks ago and since then, she has had progressive edema mostly in her extremities. Her oncologist has been managing this in the outpatient however patient edema has worsened over the last week.  Reports patient was previously dehydrated but has had good p.o. intake recently but poor urine output.  Patient had constipation recently evaluated finally had a bowel movement prior to arrival to the hospital today.  Patient has had generalized weakness and fatigue but no fevers, chills, nausea, vomiting, dizziness, shortness of breath or chest pain.  On arrival, initial vitals with stable with HR of 95 and BP of 140/69.  Labs from cancer center today shows BNP 614, WBCs 7.7, Hgb 11.8, platelet 195, sodium 126 (from 125 4 days ago), glucose 165, BUN/creatinine 25/0.36, albumin 2.5, AST/ALT 52/33, alk phos 217, bilirubin 0.5, mag 1.8. EKG showed A-fib with rate of 105. CXR with no acute cardiopulmonary disease.  Review of Systems: Please see HPI for pertinent positives and negatives. A complete 10 system review of systems are otherwise negative.  Past Medical History:  Diagnosis Date   Arthritis    Dysrhythmia    Family history of melanoma    Family history of pancreatic cancer    Family history of uterine cancer    GERD (gastroesophageal reflux disease)    Hyperlipidemia    Hypertension    pancreatic ca 06/2023   new dx   Vitamin D deficiency    Past  Surgical History:  Procedure Laterality Date   BIOPSY  07/08/2023   Procedure: BIOPSY;  Surgeon: Vida Rigger, MD;  Location: [facility];  Service: Gastroenterology;;   CATARACT EXTRACTION Bilateral    CHOLECYSTECTOMY  11/17/2023   Procedure: OPEN CHOLECYSTECTOMY;  Surgeon: [name], MD;  Location: [facility] OR;  Service: General;;   DUODENAL STENT PLACEMENT N/A 07/10/2023   Procedure: DUODENAL STENT PLACEMENT;  Surgeon: Vida Rigger, MD;  Location: [facility] ENDOSCOPY;  Service: Gastroenterology;  Laterality: N/A;   ERCP Bilateral 11/04/2023   Procedure: ENDOSCOPIC RETROGRADE CHOLANGIOPANCREATOGRAPHY (ERCP);  Surgeon: Vida Rigger, MD;  Location: [facility] ENDOSCOPY;  Service: Gastroenterology;  Laterality: Bilateral;   ESOPHAGOGASTRODUODENOSCOPY (EGD) WITH PROPOFOL N/A 07/08/2023   Procedure: ESOPHAGOGASTRODUODENOSCOPY (EGD) WITH PROPOFOL;  Surgeon: Vida Rigger, MD;  Location: [facility] ENDOSCOPY;  Service: Gastroenterology;  Laterality: N/A;   ESOPHAGOGASTRODUODENOSCOPY (EGD) WITH PROPOFOL N/A 07/10/2023   Procedure: ESOPHAGOGASTRODUODENOSCOPY (EGD) WITH PROPOFOL;  Surgeon: Vida Rigger, MD;  Location: [facility] ENDOSCOPY;  Service: Gastroenterology;  Laterality: N/A;   GASTRIC ROUX-EN-Y  11/17/2023   Procedure: OPEN ROUX-EN-Y HEPATIC-JEJUNOSTOMY;  Surgeon: [name], MD;  Location: [facility] OR;  Service: General;;   IR BILIARY DRAIN PLACEMENT WITH CHOLANGIOGRAM  11/04/2023   IR CHOLANGIOGRAM EXISTING TUBE  11/12/2023   IR CONVERT BILIARY DRAIN TO INT EXT BILIARY DRAIN  11/06/2023   PORTACATH PLACEMENT N/A 08/04/2023   Procedure: INSERTION PORT-A-CATH;  Surgeon: [name], MD;  Location: [facility] ORS;  Service: General;  Laterality: N/A;   WHIPPLE PROCEDURE N/A 11/17/2023   Procedure: EXPLORATORY LAPAROTOMY;  Surgeon: [name],  Ree Kida, MD;  Location: [facility] OR;  Service: General;  Laterality: N/A;   Social History:  reports that she has never smoked. She has never used smokeless tobacco. She reports that she does not drink  alcohol and does not use drugs.  Allergies  Allergen Reactions   Bactrim [Sulfamethoxazole-Trimethoprim] Nausea And Vomiting and Other (See Comments)    "Knocked her for a loop"- dizziness and weakness, also   Hydromorphone Other (See Comments)    Was too strong at 2 mg   Protonix [Pantoprazole] Rash    Family History  Problem Relation Age of Onset   Uterine cancer Mother        dx in her 77s   Pancreatic cancer Father    Cerebral aneurysm Sister    Melanoma Sister 38 - 31       on her leg   Sarcoidosis Sister 37   Lung cancer Brother        smoker   Pancreatic cancer Niece 55 - 22       paternal half brother's daughter   Lung cancer Niece 18 - 66       smoker     Prior to Admission medications   Medication Sig Start Date End Date Taking? Authorizing Provider  cyanocobalamin (VITAMIN B12) 1000 MCG tablet Take 1,000 mcg by mouth in the morning.   Yes [provider]  docusate sodium (COLACE) 100 MG capsule Take 1 capsule (100 mg total) by mouth 2 (two) times daily. Patient taking differently: Take 100 mg by mouth See admin instructions. Take 100 mg by mouth in the morning and evening 07/12/23  Yes Regalado, Belkys A, MD  famotidine (PEPCID) 40 MG tablet Take 40 mg by mouth daily before breakfast. 07/18/23  Yes [provider]  ferrous sulfate 325 (65 FE) MG EC tablet Take 1 tablet (325 mg total) by mouth daily with breakfast. 07/12/23 11/13/25 Yes Regalado, Belkys A, MD  lidocaine-prilocaine (EMLA) cream APPLY 1 APPLICATION TOPICALLY AS NEEDED **APPLY TO PORT 1-2 HOURS PRIOR TO APPOINTMENT Patient taking differently: Apply 1 Application topically See admin instructions. Apply to port 1-2 hours prior to appointment 11/13/23  Yes Ladene Artist, MD  methocarbamol (ROBAXIN) 500 MG tablet Take 1 tablet (500 mg total) by mouth 3 (three) times daily as needed for muscle spasms. 12/09/23  Yes Rana Snare, NP  ondansetron (ZOFRAN) 8 MG tablet Take 1 tablet (8 mg  total) by mouth every 8 (eight) hours as needed for nausea or vomiting. 01/22/24  Yes Ladene Artist, MD  oxyCODONE (OXY IR/ROXICODONE) 5 MG immediate release tablet Take 5-10 mg by mouth every 6 (six) hours as needed (for pain).   Yes [provider]  polyethylene glycol (MIRALAX / GLYCOLAX) 17 g packet Take 17 g by mouth daily as needed for mild constipation. 07/12/23  Yes Regalado, Belkys A, MD  prochlorperazine (COMPAZINE) 5 MG tablet Take 1-2 tablets (5-10 mg total) by mouth every 6 (six) hours as needed for nausea or vomiting. 02/05/24  Yes Ladene Artist, MD  simvastatin (ZOCOR) 40 MG tablet Take 0.5 tablets (20 mg total) by mouth daily with supper. Take 1/2 pill. Patient taking differently: Take 20 mg by mouth every evening. 01/05/24  Yes [name], [name] M, MD  sodium chloride 1 g tablet Take 1 tablet (1 g total) by mouth 2 (two) times daily with a meal. 02/20/24  Yes Ladene Artist, MD  traMADol (ULTRAM) 50 MG tablet Take 1 tablet (50 mg total)  by mouth every 6 (six) hours as needed. Do not take with oxycodone Patient taking differently: Take 50 mg by mouth every 6 (six) hours as needed ("do not take with oxycodone"). 02/05/24  Yes Ladene Artist, MD    Physical Exam: BP (!) 119/56 (BP Location: Right Arm)   Pulse 100   Temp (!) 97.4 F (36.3 C) (Oral)   Resp 17   SpO2 100%  General: Pleasant, frail and cachectic elderly woman laying in bed. No acute distress. HEENT: [place]/AT. Anicteric sclera. Dry mucous membrane. Neck: Mild JVD to the mid neck. CV: Mild tachycardia. Irregularly irregular rhythm. No murmurs, rubs, or gallops. 2-3+ BLE pitting edema up to knee. 2+ BUE edema Pulmonary: Lungs CTAB. Normal effort. No wheezing or rales. Decreased breath sounds at the bases. Abdominal: Healed abdominal scar. Soft, nontender, nondistended. Normal bowel sounds. Extremities: Palpable radial and DP pulses. Generalized anasarca Skin: Warm and dry. Mild erythema/skin abrasion around her  left ankle, w/o tenderness or warmth Neuro: A&Ox3. Moves all extremities. Normal sensation to light touch. No focal deficit. Psych: Normal mood and affect          Labs on Admission:  Basic Metabolic Panel: Recent Labs  Lab 02/17/24 1255 02/19/24 1120 02/23/24 1345  NA 126* 125* 126*  K 4.4 4.2 4.1  CL 94* 93* 94*  CO2 27 25 26   GLUCOSE 156* 143* 165*  BUN 24* 21 25*  CREATININE 0.46 0.46 0.36*  CALCIUM 7.9* 8.1* 8.3*  MG  --  1.5* 1.8   Liver Function Tests: Recent Labs  Lab 02/17/24 1255 02/19/24 1120 02/23/24 1345  AST 43* 45* 52*  ALT 27 28 33  ALKPHOS 178* 222* 217*  BILITOT 0.7 0.7 0.5  PROT 4.2* 4.4* 4.7*  ALBUMIN 2.4* 2.5* 2.5*   No results for input(s): "LIPASE", "AMYLASE" in the last 168 hours. No results for input(s): "AMMONIA" in the last 168 hours. CBC: Recent Labs  Lab 02/23/24 1345  WBC 7.7  NEUTROABS 6.8  HGB 11.8*  HCT 35.6*  MCV 85.8  PLT 195   Cardiac Enzymes: No results for input(s): "CKTOTAL", "CKMB", "CKMBINDEX", "TROPONINI" in the last 168 hours. BNP (last 3 results) Recent Labs    02/23/24 1345  BNP 614.7*    ProBNP (last 3 results) No results for input(s): "PROBNP" in the last 8760 hours.  CBG: No results for input(s): "GLUCAP" in the last 168 hours.  Radiological Exams on Admission: No results found. Assessment/Plan KAPRICE KAGE is a 89 y.o. female with medical history significant for adenocarcinoma of the pancreas status post completion of radiation/capecitabine on 02/05/24, A-fib, GERD, HLD, HTN, vitamin D deficiency, anemia and chronic hyponatremia who was directly admitted by the cancer center for worsening generalized edema and admitted for acute CHF.  # Acute CHF Last TTE in October 2020 showed EF 55-60%, mildly dilated LA and RA and mild to moderate mitral valve regurg.  Patient recently completed radiation therapy and subsequently developed neurolyse edema that has progressed and worsened over the last 2 weeks  more pronounced in her arms and lower legs.  Her presentation likely secondary to worsening right-sided heart failure in the setting of her recent radiation exposure. -Admit to telemetry bed -IV Lasix 40 mg daily -Follow-up repeat echocardiogram -Strict I&O's, daily weights -Apply TED hose -Trend and replete electrolytes  # Advanced pancreatic cancer Diagnosed in July 2024. Plan for Whipple procedure was aborted late last year due to involvement of the distal SMV. She has now completed capecitabine/radiation  therapy but since had decreased performance status over the last few weeks.  Patient and daughter agree the patient to be DNR. -Oncology following, appreciate recs -Change CODE STATUS to DNR -Continue as needed oxycodone and tramadol for pain -Consider palliative care consult during this hospitalization  # Hypervolemic hyponatremia Review, patient has had hyponatremia since mid November 2024.  Sodium has since dropped from the low 130s to as low as 125 4 days ago. Patient hypervolemic on exam. Urine sodium significantly low at <10, which points to an extrarenal cause. In the setting of her low albumin and CHF symptoms, her hyponatremia is likely secondary to combination of heart failure and hypoalbuminemia.  -IV diuresing as above, can consider IV albumin if no improvement with diuresing -Follow-up urine osmolality -Continue sodium chloride tablets with meals -Check urine protein/creatinine ratio to rule out nephrotic syndrome -Trend BMP  # A-fib Per daughter, patient was previously on Xarelto but this was discontinued last year due to concern for increased risk of bleeding.  EKG on arrival shows A-fib with rate of 105. HR has been stable in the 90s. -Telemetry -Will consider beta-blocker if persistently tachycardic. -Trend and replete electrolytes  # Anemia Hgb stable at 11.8. Likely in the setting of iron deficiency vitamin B12 deficiency. -Continue iron and vitamin B12  supplementation -Trend CBC  # GERD -Continue famotidine  # HLD -Continue simvastatin  # Protein caloric malnutrition # Generalized debility # Failure to thrive Patient has had decreasing performance status since completing radiation therapy 2 weeks ago.  She has had poor p.o. intake and generalized weakness. She is very cachectic and frail on exam. Albumin persistently low at 2.5. -Start Ensure 3 times daily between meals -PT/OT eval and treat -Registered dietitian consulted, appreciate recs  DVT prophylaxis: Lovenox     Code Status: Limited: Do not attempt resuscitation (DNR) -DNR-LIMITED -Do Not Intubate/DNI   Consults called: Oncology  Family Communication: Discussed admission with daughter at bedside  Severity of Illness: The appropriate patient status for this patient is INPATIENT. Inpatient status is judged to be reasonable and necessary in order to provide the required intensity of service to ensure the patient's safety. The patient's presenting symptoms, physical exam findings, and initial radiographic and laboratory data in the context of their chronic comorbidities is felt to place them at high risk for further clinical deterioration. Furthermore, it is not anticipated that the patient will be medically stable for discharge from the hospital within 2 midnights of admission.   * I certify that at the point of admission it is my clinical judgment that the patient will require inpatient hospital care spanning beyond 2 midnights from the point of admission due to high intensity of service, high risk for further deterioration and high frequency of surveillance required.*  Level of care: Telemetry   Steffanie Rainwater, MD 02/23/2024, 10:44 PM [facility] Pager: [phone] Isaiah 41:10   If 7PM-7AM, please contact night-coverage [email or web address] Password [email or web address]

## 2024-02-24 ENCOUNTER — Encounter (HOSPITAL_COMMUNITY): Payer: Self-pay | Admitting: Student

## 2024-02-24 ENCOUNTER — Inpatient Hospital Stay (HOSPITAL_COMMUNITY): Payer: Medicare Other

## 2024-02-24 ENCOUNTER — Other Ambulatory Visit: Payer: Self-pay

## 2024-02-24 DIAGNOSIS — R230 Cyanosis: Secondary | ICD-10-CM | POA: Diagnosis not present

## 2024-02-24 DIAGNOSIS — R627 Adult failure to thrive: Secondary | ICD-10-CM

## 2024-02-24 DIAGNOSIS — R0609 Other forms of dyspnea: Secondary | ICD-10-CM

## 2024-02-24 DIAGNOSIS — E43 Unspecified severe protein-calorie malnutrition: Secondary | ICD-10-CM | POA: Insufficient documentation

## 2024-02-24 DIAGNOSIS — E871 Hypo-osmolality and hyponatremia: Secondary | ICD-10-CM

## 2024-02-24 DIAGNOSIS — R601 Generalized edema: Secondary | ICD-10-CM | POA: Diagnosis not present

## 2024-02-24 LAB — PROTEIN / CREATININE RATIO, URINE
Creatinine, Urine: <10 mg/dL
Total Protein, Urine: <6 mg/dL

## 2024-02-24 LAB — COMPREHENSIVE METABOLIC PANEL
ALT: 34 U/L (ref 0–44)
AST: 45 U/L — ABNORMAL HIGH (ref 15–41)
Albumin: 1.7 g/dL — ABNORMAL LOW (ref 3.5–5.0)
Alkaline Phosphatase: 189 U/L — ABNORMAL HIGH (ref 38–126)
Anion gap: 7 (ref 5–15)
BUN: 28 mg/dL — ABNORMAL HIGH (ref 8–23)
CO2: 25 mmol/L (ref 22–32)
Calcium: 7.9 mg/dL — ABNORMAL LOW (ref 8.9–10.3)
Chloride: 96 mmol/L — ABNORMAL LOW (ref 98–111)
Creatinine, Ser: 0.4 mg/dL — ABNORMAL LOW (ref 0.44–1.00)
GFR, Estimated: >60 mL/min (ref >60)
Glucose, Bld: 145 mg/dL — ABNORMAL HIGH (ref 70–99)
Potassium: 3.2 mmol/L — ABNORMAL LOW (ref 3.5–5.1)
Sodium: 128 mmol/L — ABNORMAL LOW (ref 135–145)
Total Bilirubin: 0.4 mg/dL (ref 0.0–1.2)
Total Protein: 4 g/dL — ABNORMAL LOW (ref 6.5–8.1)

## 2024-02-24 LAB — CBC
HCT: 31.4 % — ABNORMAL LOW (ref 36.0–46.0)
Hemoglobin: 10.4 g/dL — ABNORMAL LOW (ref 12.0–15.0)
MCH: 28.7 pg (ref 26.0–34.0)
MCHC: 33.1 g/dL (ref 30.0–36.0)
MCV: 86.7 fL (ref 80.0–100.0)
Platelets: 167 10*3/uL (ref 150–400)
RBC: 3.62 MIL/uL — ABNORMAL LOW (ref 3.87–5.11)
RDW: 21.9 % — ABNORMAL HIGH (ref 11.5–15.5)
WBC: 5.9 10*3/uL (ref 4.0–10.5)
nRBC: 0 % (ref 0.0–0.2)

## 2024-02-24 LAB — ECHOCARDIOGRAM COMPLETE
Height: 66 in
P 1/2 time: 334 ms
S' Lateral: 2.8 cm
Weight: 2000.01 [oz_av]

## 2024-02-24 LAB — PHOSPHORUS: Phosphorus: 2.6 mg/dL (ref 2.5–4.6)

## 2024-02-24 LAB — MAGNESIUM: Magnesium: 1.9 mg/dL (ref 1.7–2.4)

## 2024-02-24 MED ORDER — POTASSIUM CHLORIDE CRYS ER 20 MEQ PO TBCR
40.0000 meq | EXTENDED_RELEASE_TABLET | Freq: Two times a day (BID) | ORAL | Status: AC
  Administered 2024-02-24 (×2): 40 meq via ORAL
  Filled 2024-02-24 (×2): qty 2

## 2024-02-24 NOTE — Hospital Course (Signed)
 89 y.o. F with inoperable pancreatic CA on palliative capecitabine/radiation, hx pAF not on [facility], HLD who presented from Oncology clinic with swelling, weakness.

## 2024-02-24 NOTE — Assessment & Plan Note (Signed)
-   Consult dietitian - Nutrition supplements

## 2024-02-24 NOTE — Assessment & Plan Note (Addendum)
 Likely acute on chronic systolic congestive heart failure Has developed progressive anasarca over weeks.  Likely combination of hypoalbuminemia and possibly CHF.  EF on echo today shows worsening to 40-45% with RWMA.  No angina noted by patient. - Stop salt tabs - Continue IV Lasix - Ensure TID to promote albumin - Daily BMP - Strict I/Os, daily weights - Consult Cardiology, appreciate cares

## 2024-02-24 NOTE — Progress Notes (Signed)
 IP PROGRESS NOTE  Subjective:   Ms. Bilski was admitted yesterday with failure to thrive, edema, and dyspnea.  She reports feeling better this morning.  She continues to have back pain.  Daughter was at the bedside when I saw her this morning.  Objective: Vital signs in last 24 hours: Blood pressure 119/63, pulse 76, temperature (!) 97.5 F (36.4 C), temperature source Axillary, resp. rate 19, height 5\' 6"  (1.676 m), weight 125 lb (56.7 kg), SpO2 94%.  Intake/Output from previous day: 02/24 0701 - 02/25 0700 In: 240 [P.O.:240] Out: 1250 [Urine:1250]  Physical Exam:  HEENT: No thrush Cardiac: Irregular Abdomen: Soft, no hepatosplenomegaly, no mass Extremities: Pitting edema at the lower leg and hands bilaterally Musculoskeletal: No spine tenderness, no rash or skin breakdown  Portacath/PICC-without erythema  Lab Results: Recent Labs    02/23/24 1345 02/24/24 0309  WBC 7.7 5.9  HGB 11.8* 10.4*  HCT 35.6* 31.4*  PLT 195 167    BMET Recent Labs    02/23/24 1345 02/24/24 0309  NA 126* 128*  K 4.1 3.2*  CL 94* 96*  CO2 26 25  GLUCOSE 165* 145*  BUN 25* 28*  CREATININE 0.36* 0.40*  CALCIUM 8.3* 7.9*    Lab Results  Component Value Date   CAN199 206 (H) 01/06/2024    Studies/Results: ECHOCARDIOGRAM COMPLETE Result Date: 02/24/2024    ECHOCARDIOGRAM REPORT   Patient Name:   NIYONNA BETSILL Date of Exam: 02/24/2024 Medical Rec #:  [id number]        Height:       66.0 in Accession #:    [id number]       Weight:       125.0 lb Date of Birth:  [birth year]        BSA:          1.638 m Patient Age:    89 years         BP:           112/53 mmHg Patient Gender: F                HR:           100 bpm. Exam Location:  Inpatient Procedure: 2D Echo, Cardiac Doppler and Color Doppler (Both Spectral and Color            Flow Doppler were utilized during procedure). Indications:    Dyspnea R06.00  History:        Patient has prior history of Echocardiogram examinations, most                  recent 10/14/2019. Arrythmias:Atrial Fibrillation; Risk                 Factors:Hypertension and Dyslipidemia.  Sonographer:    Araceli Bouche Referring Phys: [id number] PROSPER M AMPONSAH IMPRESSIONS  1. Left ventricular ejection fraction, by estimation, is 40 to 45%. The left ventricle has mildly decreased function. The left ventricle demonstrates regional wall motion abnormalities (see scoring diagram/findings for description). Left ventricular diastolic parameters are consistent with Grade I diastolic dysfunction (impaired relaxation).  2. Right ventricular systolic function is mildly reduced. The right ventricular size is normal. There is normal pulmonary artery systolic pressure.  3. Left atrial size was mildly dilated.  4. Right atrial size was mildly dilated.  5. The mitral valve is normal in structure. Mild mitral valve regurgitation. No evidence of mitral stenosis.  6. The aortic valve is tricuspid. Aortic valve regurgitation is moderate. No  aortic stenosis is present.  7. The inferior vena cava is normal in size with greater than 50% respiratory variability, suggesting right atrial pressure of 3 mmHg. FINDINGS  Left Ventricle: Left ventricular ejection fraction, by estimation, is 40 to 45%. The left ventricle has mildly decreased function. The left ventricle demonstrates regional wall motion abnormalities. The left ventricular internal cavity size was normal in size. There is no left ventricular hypertrophy. Left ventricular diastolic parameters are consistent with Grade I diastolic dysfunction (impaired relaxation).  LV Wall Scoring: Anterior and anterolateral wall motion abnormalities. Right Ventricle: The right ventricular size is normal. No increase in right ventricular wall thickness. Right ventricular systolic function is mildly reduced. There is normal pulmonary artery systolic pressure. The tricuspid regurgitant velocity is 2.42 m/s, and with an assumed right atrial pressure of 3 mmHg, the  estimated right ventricular systolic pressure is 26.4 mmHg. Left Atrium: Left atrial size was mildly dilated. Right Atrium: Right atrial size was mildly dilated. Pericardium: Trivial pericardial effusion is present. Mitral Valve: The mitral valve is normal in structure. Mild mitral valve regurgitation. No evidence of mitral valve stenosis. Tricuspid Valve: The tricuspid valve is normal in structure. Tricuspid valve regurgitation is mild . No evidence of tricuspid stenosis. Aortic Valve: The aortic valve is tricuspid. Aortic valve regurgitation is moderate. Aortic regurgitation PHT measures 334 msec. No aortic stenosis is present. Pulmonic Valve: The pulmonic valve was normal in structure. Pulmonic valve regurgitation is trivial. No evidence of pulmonic stenosis. Aorta: The aortic root is normal in size and structure. Venous: The inferior vena cava is normal in size with greater than 50% respiratory variability, suggesting right atrial pressure of 3 mmHg. IAS/Shunts: No atrial level shunt detected by color flow Doppler. Additional Comments: 3D imaging was not performed.  LEFT VENTRICLE PLAX 2D LVIDd:         3.30 cm LVIDs:         2.80 cm LV PW:         1.20 cm LV IVS:        1.20 cm LVOT diam:     1.70 cm LV SV:         39 LV SV Index:   24 LVOT Area:     2.27 cm  RIGHT VENTRICLE            IVC RV S prime:     8.81 cm/s  IVC diam: 1.40 cm TAPSE (M-mode): 1.7 cm LEFT ATRIUM           Index        RIGHT ATRIUM           Index LA diam:      2.80 cm 1.71 cm/m   RA Area:     18.40 cm LA Vol (A2C): 35.9 ml 21.92 ml/m  RA Volume:   43.60 ml  26.62 ml/m LA Vol (A4C): 45.9 ml 28.03 ml/m  AORTIC VALVE             PULMONIC VALVE LVOT Vmax:   97.90 cm/s  PV Vmax:       0.74 m/s LVOT Vmean:  60.300 cm/s PV Peak grad:  2.2 mmHg LVOT VTI:    0.170 m AI PHT:      334 msec  AORTA Ao Root diam: 3.00 cm Ao Asc diam:  3.00 cm TRICUSPID VALVE TR Peak grad:   23.4 mmHg TR Vmax:        242.00 cm/s  SHUNTS Systemic VTI:  0.17 m  Systemic  Diam: 1.70 cm Clearnce Hasten Electronically signed by Clearnce Hasten Signature Date/Time: 02/24/2024/10:04:42 AM    Final    DG Chest Port 1 View Result Date: 02/23/2024 CLINICAL DATA:  Dyspnea EXAM: PORTABLE CHEST 1 VIEW COMPARISON:  08/04/2023 FINDINGS: Single frontal view of the chest demonstrates stable right chest wall port. The cardiac silhouette is stable. No acute airspace disease, effusion, or pneumothorax. No acute bony abnormalities. IMPRESSION: 1. No acute intrathoracic process. Electronically Signed   By: Sharlet Salina M.D.   On: 02/23/2024 23:19    Medications: I have reviewed the patient's current medications.  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  Fluid-filled and distended stomach and proximal duodenum, single 11 mm periportal lymph node EGD 07/08/2023-large food residue in the stomach, extrinsic deformity at the first portion of the duodenum-biopsied Elevated CA 19-9 CT chest 07/11/2023-scattered small pulmonary nodules-indeterminate Cycle 1 gemcitabine/Abraxane 08/05/2023 Cycle 2 gemcitabine/Abraxane 08/19/2023 Cycle 3 gemcitabine/Abraxane 09/02/2023 Cycle 4 gemcitabine/Abraxane 09/16/2023 Cycle 5 gemcitabine/Abraxane 09/30/2023 CTs 10/09/2023-increase size of pancreas mass, increased pancreatic duct caliber and new biliary dilation, bilateral lower lobe nodules are stable, hypodense left thyroid nodule Cycle 6 gemcitabine/Abraxane 10/13/2023 10/28/2023 chemotherapy held due to LFT abnormalities Biliary drain placed 11/04/2023; internal/external biliary drain 11/06/2023 Exploratory laparotomy, cholecystectomy, choledochojejunostomy 11/17/2023-bulky tumor of the pancreas  head with involvement of the transverse mesocolon and probable involvement of the distal SMV-plan for pancreaticoduodenectomy aborted Case presented at GI tumor conference 12/03/2023-recommendation to proceed with capecitabine/radiation Referred to Dr. Mitzi Hansen 12/09/2023 Radiation/capecitabine 12/29/2023-02/05/2024 CTs 02/16/2024: Pancreas head mass is smaller, unchanged duodenal stent, no evidence of lymphadenopathy or metastatic disease   2.   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   Anemia 4.   Iron deficiency 5.   B12 deficiency 6.   Atrial fibrillation-maintained on Xarelto prior to hospital admission 7.  Family history of pancreas and uterine cancer 8.  Hyperlipidemia 9.  Genetics-negative 10.  Admission on 02/23/2024 with failure to thrive and volume overload     Ms. Jakel is admitted yesterday with failure to thrive and evidence of volume overload.  Her symptoms may be related to CHF. She has a history of locally advanced unresectable pancreas cancer and recently completed a course of radiation and capecitabine.  It is possible her symptoms are related to progression of the pancreas cancer, but there was no evidence of progressive disease on CTs 02/16/2024.  The hyponatremia is likely related to volume overload.  She is undergoing furosemide diuresis.  The sodium is higher today.  I appreciate the care from the hospitalist service.  Recommendations: Continue management of volume overload and hyponatremia per the medical service Out of bed, ambulate as tolerated Nutrition consult Please call oncology as needed on 02/25/2024, I will see her 02/26/2024. Outpatient follow-up will be scheduled at the Cancer center.    LOS: 1 day   [name], MD   02/24/2024, 1:41 PM

## 2024-02-24 NOTE — Progress Notes (Signed)
 Progress Note   Patient: Wendy [name] NUU:[id number] DOB: 1934-09-28 DOA: 02/23/2024     1 DOS: the patient was seen and examined on 02/24/2024 at 10:49AM      Brief hospital course: 89 y.o. F with inoperable pancreatic CA on palliative capecitabine/radiation, hx pAF not on [facility], HLD who presented from Oncology clinic with swelling, weakness.     Assessment and Plan: * Generalized edema Likely acute on chronic systolic congestive heart failure Has developed progressive anasarca over weeks.  Likely combination of hypoalbuminemia and possibly CHF.  EF on echo today shows worsening to 40-45% with RWMA.  No angina noted by patient. - Stop salt tabs - Continue IV Lasix - Ensure TID to promote albumin - Daily BMP - Strict I/Os, daily weights - Consult Cardiology, appreciate cares     Hyponatremia Hypokalemia Urine sodium low, c/w CHF, anasarca or dehydration. - Hold fluids - Hold salt tabs - Continue diuretics - Supplement K - Daily BMP - Defer tolvaptan per Cardiology   Cancer of head of pancreas [facility]) Diagnosed July 2024.  Complicated by gastric outlet obstruction requiring duodenal stent and biliary obstruction requiring biliary stent.  Did go for Whipple, but this was aborted due to involvement of [facility].  Underwent treatment with capecitabine/Abraxane and radiation, and had some treatment response.  Current plan appears to be clinical monitoring, due to tumor response and also worsening functional status. - Consult Oncology, appreciate cares   Cyanosis of hands Fingertips show marked purple color.  Patient denies pain.  Family think this appears near normal to them.    Arterial duplex shows normal flow.  No further work up planned at this time.   Protein-calorie malnutrition, severe Failure to thrive in adult - Consult dietitian - Nutrition supplements   Paroxysmal atrial fibrillation (HCC) Longstanding.  Follows with Dr. [name].  Not on anticoagulation.   Rate controlled here.  Normocytic anemia Due to chemo.  No bleeding reported or observed.  Hyperlipidemia - Continue simvastatin          Subjective: Patient just feels weak, tired, frustrated that she feels worse after treating her cancer.  No fever, no confusion.     Physical Exam: BP 119/63 (BP Location: Right Arm)   Pulse 76   Temp (!) 97.5 F (36.4 C) (Axillary)   Resp 19   Ht 5\' 6"  (1.676 m)   Wt 56.7 kg   SpO2 94%   BMI 20.18 kg/m   Elderly adult female, lying in bed, appears weak and debilitated, can barely lift her arms Heart rate normal, sounds regular to me, no murmurs 3+ pitting in legs and arms Respiratory rate seems normal, respirations shallow, no rales or wheezes appreciated Abdomen soft without tenderness palpation or guarding, no ascites or distention Attention normal, affect normal, judgment and insight appear normal, severe generalized weakness but symmetric strength, oriented to person, place, time    Data Reviewed: Discussed with cardiology Basic metabolic panel shows potassium down to 3.2, sodium up to 128 Creatinine stable at 0.4 Albumin 1.7 BNP 614 CBC shows mild anemia  Family Communication: Husband and daughter at the bedside    Disposition: Status is: Inpatient The patient was admitted for anasarca  This is a combination of possible CHF, and obviously hypoalbuminemia.  Liver function appears normal, renal function appears normal.  Echocardiogram shows new reduced EF  Will consult cardiology, continue cautious diuresis, overall prognosis poor        Author: Alberteen Sam, MD 02/24/2024 3:54 PM  For  on call review [email or web address].

## 2024-02-24 NOTE — Assessment & Plan Note (Signed)
 Diagnosed July 2024.  Complicated by gastric outlet obstruction requiring duodenal stent and biliary obstruction requiring biliary stent.  Did go for Whipple, but this was aborted due to involvement of SMV.  Underwent treatment with capecitabine/Abraxane and radiation, and had some treatment response.  Current plan appears to be clinical monitoring, due to tumor response and also worsening functional status. - Consult Oncology, appreciate cares

## 2024-02-24 NOTE — Progress Notes (Signed)
 Initial Nutrition Assessment  DOCUMENTATION CODES:  Severe malnutrition in context of chronic illness  INTERVENTION:  Ensure Enlive po TID, each supplement provides 350 kcal and 20 grams of protein.  Magic cup TID with meals, each supplement provides 290 kcal and 9 grams of protein   Continued PO intake of meals to help meet calorie and protein needs.  Continued MVI use as recommended by MD  NUTRITION DIAGNOSIS:  Severe Malnutrition related to chronic illness (adenocarcinoma of pancrease) as evidenced by energy intake < or equal to 75% for > or equal to 1 month, severe muscle depletion, severe fat depletion, edema.  GOAL:  Patient will meet greater than or equal to 90% of their needs  MONITOR:  PO intake, Supplement acceptance, Weight trends  REASON FOR ASSESSMENT:  Consult Assessment of nutrition requirement/status  ASSESSMENT:  Pt with hx of adenocarcinoma of pancreas (s/p radiation/capecitabine completion 02/05/24). Hx of GERD, HTN, HLD, a-fib, vitamin d deficiency, chronic hyponatremia, and anemia. Admitted d/t worsening edema, failure to thrive, weakness, and fatigue.  Spoke with pt and pt's husband who was at bedside. Pt reports she has fair appetite since admission, but is still only eating 50% of what she orders. Husband reports she needs to eat softer foods which make it easier for her to swallow. For breakfast, she ate some sausage but could not eat the pancakes since they were burnt. Pt has also been dealing with constipation which lowers her appetite.   PTA, pt's husband reports even if pt didn't have appetite, she was trying to eat small amounts of foods 6x per day (per recommendation of Oncology RD). Pt's husband said pt had been doing well eating mostly soft foods like mashed potatoes, grits, cut up meats, and soups. Intake had been very poor right before admission most likely related to edema. Typical intake does not seem sufficient to meet increased needs of  cancer.  Pt reports typical weight of 120 #, but unsure when that weight was from or if any edema was present. Per chart review, last MD visit with oncologist showed weight of 49.9 kg (108.9 #) on 02/16/24 which may be more indicative of dry weight. Pt dehydrated at visit and given instructions for how to increase fluid intake.  Pt's husband reports pt is mobile at home with assistance of a person to walk. He reports getting pt up 3x per day to walk around the house with him. He reports recently, pt has regained some strength to the point where she could stand up out of a chair without assistance.   Medications reviewed and include: vitamin B12, Ensure Enlive TID, ferrous sulfate, multivitamin, potassium chloride, sodium chloride Lasix, pepcid, colace, lovenox  Labs reviewed: sodium 128, Potassium 3.2, Chloride 96, Calcium 7.9, Hgb 10.4  NUTRITION - FOCUSED PHYSICAL EXAM: Flowsheet Row Most Recent Value  Orbital Region Severe depletion  Upper Arm Region Severe depletion  Thoracic and Lumbar Region Severe depletion  Buccal Region Moderate depletion  Temple Region Severe depletion  Clavicle Bone Region Severe depletion  Clavicle and Acromion Bone Region Severe depletion  Scapular Bone Region Severe depletion  Dorsal Hand Unable to assess  [severe edema]  Patellar Region Unable to assess  [severe pitting edema]  Anterior Thigh Region Unable to assess  [severe pitting edema]  Posterior Calf Region Unable to assess  [severe pitting edema]  Edema (RD Assessment) Severe  Hair Reviewed  Eyes Reviewed  Mouth Reviewed  Skin Reviewed  Nails Reviewed   Diet Order:   Diet Order  Diet regular Room service appropriate? Yes; Fluid consistency: Thin  Diet effective now                  EDUCATION NEEDS:  Education needs have been addressed  Skin:  Skin Assessment: Reviewed RN Assessment (bilateral arm bruising)  Last BM:  per chart review, last bm 2/25 type 2  Height:  Ht  Readings from Last 1 Encounters:  02/24/24 5\' 6"  (1.676 m)   Weight:  Wt Readings from Last 1 Encounters:  02/24/24 56.7 kg   Ideal Body Weight:  59.1 kg  BMI:  Body mass index is 20.18 kg/m.  Estimated Nutritional Needs:   Kcal:  1500-1700  Protein:  80-95g  Fluid:  1.5-1.7L  Louis Meckel Dietetic Intern

## 2024-02-24 NOTE — Assessment & Plan Note (Addendum)
 Due to chemo.  No bleeding reported or observed.

## 2024-02-24 NOTE — Progress Notes (Signed)
 Bilateral upper extremity arterial duplex has been completed. Preliminary results can be found in CV Proc through chart review.   02/24/24 3:39 PM Olen Cordial RVT

## 2024-02-24 NOTE — Assessment & Plan Note (Addendum)
 Hypokalemia - Supplement K - Hold salt tabs - Continue diuretics - Daily BMP - Defer tolvaptan per Cardiology

## 2024-02-24 NOTE — TOC Initial Note (Signed)
 Transition of Care [facility]) - Initial/Assessment Note   Patient Details  Name: Wendy [name] MRN: [id number] Date of Birth: 03-Jun-1934  Transition of Care [facility] CM/SW Contact:    Ewing Schlein, LCSW Phone Number: 02/24/2024, 2:35 PM  Clinical Narrative: [facility] consulted for heart failure screening, but patient has already been referred to the Heart Failure Navigation Team so consult will be cleared at this time. PT/OT have been consulted. TOC awaiting recommendations.  Expected Discharge Plan:  (TBD) Barriers to Discharge: Continued Medical Work up  Expected Discharge Plan and Services In-house Referral: Clinical Social Work Living arrangements for the past 2 months: Single Family Home  Prior Living Arrangements/Services Living arrangements for the past 2 months: Single Family Home Lives with:: Spouse, Adult Children Patient language and need for interpreter reviewed:: Yes Do you feel safe going back to the place where you live?: Yes      Need for Family Participation in Patient Care: Yes (Comment) Care giver support system in place?: Yes (comment) Criminal Activity/Legal Involvement Pertinent to Current Situation/Hospitalization: No - Comment as needed  Activities of Daily Living ADL Screening (condition at time of admission) Independently performs ADLs?: No Does the patient have a NEW difficulty with bathing/dressing/toileting/self-feeding that is expected to last >3 days?: No Does the patient have a NEW difficulty with getting in/out of bed, walking, or climbing stairs that is expected to last >3 days?: No Does the patient have a NEW difficulty with communication that is expected to last >3 days?: No Is the patient deaf or have difficulty hearing?: No Does the patient have difficulty seeing, even when wearing glasses/contacts?: No Does the patient have difficulty concentrating, remembering, or making decisions?: No  Emotional Assessment Orientation: : Oriented to Self, Oriented  to Place, Oriented to  Time, Oriented to Situation Alcohol / Substance Use: Not Applicable Psych Involvement: No (comment)  Admission diagnosis:  A-fib (HCC) [I48.91] Acute CHF (congestive heart failure) (HCC) [I50.9] Patient Active Problem List   Diagnosis Date Noted   Hyponatremia 02/24/2024   Failure to thrive in adult 02/24/2024   Generalized edema 02/23/2024   Pancreatic cancer (HCC) 11/04/2023   Biliary obstruction 11/04/2023   Normocytic anemia 11/04/2023   Paroxysmal atrial fibrillation (HCC) 11/04/2023   Hypertensive urgency 11/04/2023   GERD (gastroesophageal reflux disease) 11/04/2023   Genetic testing 08/06/2023   Family history of pancreatic cancer    Family history of melanoma    Family history of uterine cancer    Cancer of head of pancreas (HCC) 07/24/2023   Malnutrition of moderate degree 07/08/2023   Pancreatic mass 07/07/2023   Medication management 10/02/2017   Irregular heart rate 10/02/2017   Sinus bradycardia 10/02/2017   Premature atrial contractions 10/02/2017   Hypertension    Hyperlipidemia    Vitamin D deficiency    PCP:  [name], MD Pharmacy:   [facility] [address], [place] - [address] [place] [address] Phone: [phone] Fax: [phone]  Social Drivers of Health (SDOH) Social History: SDOH Screenings   Food Insecurity: No Food Insecurity (02/24/2024)  Housing: Low Risk  (02/24/2024)  Transportation Needs: No Transportation Needs (02/24/2024)  Utilities: Not At Risk (02/24/2024)  Depression (PHQ2-9): Low Risk  (12/16/2023)  Financial Resource Strain: Low Risk  (08/05/2023)  Tobacco Use: Low Risk  (02/24/2024)  Health Literacy: Adequate Health Literacy (08/05/2023)   SDOH Interventions:    Readmission Risk Interventions    02/24/2024    2:35 PM 11/21/2023  11:43 AM  Readmission Risk Prevention Plan  Transportation Screening Complete Complete  Home Care Screening  Complete  Medication  Review (RN CM)  Complete  HRI or Home Care Consult Complete   Social Work Consult for Recovery Care Planning/Counseling Complete   Palliative Care Screening Not Applicable   Medication Review Oceanographer) Complete

## 2024-02-24 NOTE — Assessment & Plan Note (Signed)
 Longstanding.  Follows with Dr. [name].  Not on anticoagulation.  Rate controlled here.

## 2024-02-24 NOTE — Assessment & Plan Note (Signed)
 Continue simvastatin.

## 2024-02-24 NOTE — Progress Notes (Signed)
  Echocardiogram 2D Echocardiogram has been performed.  Leda Roys RDCS 02/24/2024, 9:59 AM

## 2024-02-25 ENCOUNTER — Inpatient Hospital Stay (HOSPITAL_COMMUNITY): Payer: Medicare Other

## 2024-02-25 DIAGNOSIS — I5021 Acute systolic (congestive) heart failure: Secondary | ICD-10-CM

## 2024-02-25 DIAGNOSIS — R601 Generalized edema: Secondary | ICD-10-CM | POA: Diagnosis not present

## 2024-02-25 DIAGNOSIS — I480 Paroxysmal atrial fibrillation: Secondary | ICD-10-CM

## 2024-02-25 LAB — COMPREHENSIVE METABOLIC PANEL
ALT: 31 U/L (ref 0–44)
AST: 42 U/L — ABNORMAL HIGH (ref 15–41)
Albumin: 1.7 g/dL — ABNORMAL LOW (ref 3.5–5.0)
Alkaline Phosphatase: 180 U/L — ABNORMAL HIGH (ref 38–126)
Anion gap: 8 (ref 5–15)
BUN: 29 mg/dL — ABNORMAL HIGH (ref 8–23)
CO2: 28 mmol/L (ref 22–32)
Calcium: 8.2 mg/dL — ABNORMAL LOW (ref 8.9–10.3)
Chloride: 95 mmol/L — ABNORMAL LOW (ref 98–111)
Creatinine, Ser: 0.37 mg/dL — ABNORMAL LOW (ref 0.44–1.00)
GFR, Estimated: >60 mL/min (ref >60)
Glucose, Bld: 147 mg/dL — ABNORMAL HIGH (ref 70–99)
Potassium: 4 mmol/L (ref 3.5–5.1)
Sodium: 131 mmol/L — ABNORMAL LOW (ref 135–145)
Total Bilirubin: 0.5 mg/dL (ref 0.0–1.2)
Total Protein: 4.2 g/dL — ABNORMAL LOW (ref 6.5–8.1)

## 2024-02-25 LAB — CBC
HCT: 31.1 % — ABNORMAL LOW (ref 36.0–46.0)
Hemoglobin: 10.1 g/dL — ABNORMAL LOW (ref 12.0–15.0)
MCH: 28.5 pg (ref 26.0–34.0)
MCHC: 32.5 g/dL (ref 30.0–36.0)
MCV: 87.9 fL (ref 80.0–100.0)
Platelets: 184 10*3/uL (ref 150–400)
RBC: 3.54 MIL/uL — ABNORMAL LOW (ref 3.87–5.11)
RDW: 22.5 % — ABNORMAL HIGH (ref 11.5–15.5)
WBC: 5.7 10*3/uL (ref 4.0–10.5)
nRBC: 0 % (ref 0.0–0.2)

## 2024-02-25 MED ORDER — METOPROLOL SUCCINATE ER 25 MG PO TB24
12.5000 mg | ORAL_TABLET | Freq: Every day | ORAL | Status: DC
  Administered 2024-02-25: 12.5 mg via ORAL
  Filled 2024-02-25: qty 1

## 2024-02-25 MED ORDER — KETOROLAC TROMETHAMINE 15 MG/ML IJ SOLN
15.0000 mg | Freq: Once | INTRAMUSCULAR | Status: AC
  Administered 2024-02-25: 15 mg via INTRAVENOUS
  Filled 2024-02-25: qty 1

## 2024-02-25 MED ORDER — CARVEDILOL 3.125 MG PO TABS
3.1250 mg | ORAL_TABLET | Freq: Two times a day (BID) | ORAL | Status: DC
  Administered 2024-02-25 – 2024-02-29 (×9): 3.125 mg via ORAL
  Filled 2024-02-25 (×9): qty 1

## 2024-02-25 MED ORDER — ASPIRIN 81 MG PO CHEW
81.0000 mg | CHEWABLE_TABLET | Freq: Every day | ORAL | Status: DC
  Administered 2024-02-25 – 2024-02-29 (×5): 81 mg via ORAL
  Filled 2024-02-25 (×5): qty 1

## 2024-02-25 NOTE — Progress Notes (Signed)
 PT Cancellation Note  Patient Details Name: NEDRA MCINNIS MRN: [id number] DOB: 1934/01/15   Cancelled Treatment:    Reason Eval/Treat Not Completed: Medical issues which prohibited therapy (per family, "this isn't a good time, she's gone downhill, the Dr just ordered some tests". Will follow.)   Tamala Ser PT 02/25/2024  Acute Rehabilitation Services  Office [phone]

## 2024-02-25 NOTE — Consult Note (Signed)
 Cardiology Consultation   Patient ID: Wendy [name] MRN: [id number]; DOB: Dec 30, 1934  Admit date: 02/23/2024 Date of Consult: 02/25/2024  PCP:  [name], MD   [facility] Providers Cardiologist:  [name] [name], MD     Patient Profile:   Wendy [name] is a 89 y.o. female with a hx of pancreatic cancer on palliative capecitabine/radiation, paroxysmal atrial fibrillation, HLD, HTN who is being seen 02/25/2024 for the evaluation of CHF at the request of Dr. Elvera Lennox.  History of Present Illness:   Wendy [name] is an 89 year old female with above medical history who is followed by Dr. [name].  Per chart review, patient was found to be in atrial fibrillation in 09/2019 during a routine physical.  She was asymptomatic and heart rate was well-controlled.  She was started on Xarelto.  Echocardiogram 10/14/2019 showed EF of 55-60%, no LVH, normal RV function, mild biatrial enlargement, mild-moderate mitral valve regurgitation, moderate tricuspid valve regurgitation, mild-moderate aortic valve regurgitation.   Patient was later admitted in 06/2023 with nausea, found to have a pancreatic mass.  Had a duodenal stent placed.  Xarelto was discontinued due to bleeding risk.  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 She is followed by Dr. Truett Perna and is on palliative capecitabine/radiation. Per Dr. Kalman Drape recent note, she is not a candidate for further treatment of the cancer.   Patient was seen on 2/24 by her oncologist. At that time, patient reported worsening generalized edema, shortness of breath with exertion. She had good fluid intake but poor urine output, appetite in general was poor. She was found to be in afib with RVR, HR in the 110s. She was admitted to the hospital for further treatment.   On arrival to the hospital, HR was 95 and BP 140/69. Labs showed BNP elevated to 614, WBC 7.7, hemoglobin 11.8, platelets 195. Na 126, creatinine 0.36,  albumin 2.5. CXR showed no acute intrathoracic process. EKG showed atrial fibrillation with HR 105 BPM. She was started on IV lasix 40 mg daily. Also started on sodium chloride tablets with meals which have since been discontinued.   Echocardiogram yesterday showed EF 40-45% with regional wall motion abnormalities, grade I DD, mildly reduced RV systolic function, normal PA systolic pressure, mild MVR, moderate AI. Cardiology consulted due to reduced EF.   On interview, patient reports that she feels a bit stronger today. She continues to have swelling in her legs, hands. When she elevates her hands, the swelling goes down. She denies chest pain, shortness of breath. Denies palpitations. She has gotten out of bed today to work with occupational therapy and is sitting upright in the chair. She has not yet worked with PT.   Past Medical History:  Diagnosis Date   Arthritis    Dysrhythmia    Family history of melanoma    Family history of pancreatic cancer    Family history of uterine cancer    GERD (gastroesophageal reflux disease)    Hyperlipidemia    Hypertension    pancreatic ca 06/2023   new dx   Vitamin D deficiency     Past Surgical History:  Procedure Laterality Date   BIOPSY  07/08/2023   Procedure: BIOPSY;  Surgeon: Vida Rigger, MD;  Location: [facility];  Service: Gastroenterology;;   CATARACT EXTRACTION Bilateral    CHOLECYSTECTOMY  11/17/2023   Procedure: OPEN CHOLECYSTECTOMY;  Surgeon: [name], MD;  Location: [facility] OR;  Service: General;;   DUODENAL STENT PLACEMENT N/A 07/10/2023  Procedure: DUODENAL STENT PLACEMENT;  Surgeon: Vida Rigger, MD;  Location: [facility];  Service: Gastroenterology;  Laterality: N/A;   ERCP Bilateral 11/04/2023   Procedure: ENDOSCOPIC RETROGRADE CHOLANGIOPANCREATOGRAPHY (ERCP);  Surgeon: Vida Rigger, MD;  Location: [facility] ENDOSCOPY;  Service: Gastroenterology;  Laterality: Bilateral;   ESOPHAGOGASTRODUODENOSCOPY (EGD) WITH PROPOFOL N/A  07/08/2023   Procedure: ESOPHAGOGASTRODUODENOSCOPY (EGD) WITH PROPOFOL;  Surgeon: Vida Rigger, MD;  Location: [facility] ENDOSCOPY;  Service: Gastroenterology;  Laterality: N/A;   ESOPHAGOGASTRODUODENOSCOPY (EGD) WITH PROPOFOL N/A 07/10/2023   Procedure: ESOPHAGOGASTRODUODENOSCOPY (EGD) WITH PROPOFOL;  Surgeon: Vida Rigger, MD;  Location: [facility] ENDOSCOPY;  Service: Gastroenterology;  Laterality: N/A;   GASTRIC ROUX-EN-Y  11/17/2023   Procedure: OPEN ROUX-EN-Y HEPATIC-JEJUNOSTOMY;  Surgeon: [name], MD;  Location: [facility] OR;  Service: General;;   IR BILIARY DRAIN PLACEMENT WITH CHOLANGIOGRAM  11/04/2023   IR CHOLANGIOGRAM EXISTING TUBE  11/12/2023   IR CONVERT BILIARY DRAIN TO INT EXT BILIARY DRAIN  11/06/2023   PORTACATH PLACEMENT N/A 08/04/2023   Procedure: INSERTION PORT-A-CATH;  Surgeon: [name], MD;  Location: [facility] ORS;  Service: General;  Laterality: N/A;   WHIPPLE PROCEDURE N/A 11/17/2023   Procedure: EXPLORATORY LAPAROTOMY;  Surgeon: [name], MD;  Location: [facility] OR;  Service: General;  Laterality: N/A;     Home Medications:  Prior to Admission medications   Medication Sig Start Date End Date Taking? Authorizing Provider  cyanocobalamin (VITAMIN B12) 1000 MCG tablet Take 1,000 mcg by mouth in the morning.   Yes [provider]  docusate sodium (COLACE) 100 MG capsule Take 1 capsule (100 mg total) by mouth 2 (two) times daily. Patient taking differently: Take 100 mg by mouth See admin instructions. Take 100 mg by mouth in the morning and evening 07/12/23  Yes Regalado, Belkys A, MD  famotidine (PEPCID) 40 MG tablet Take 40 mg by mouth daily before breakfast. 07/18/23  Yes [provider]  ferrous sulfate 325 (65 FE) MG EC tablet Take 1 tablet (325 mg total) by mouth daily with breakfast. 07/12/23 11/13/25 Yes Regalado, Belkys A, MD  lidocaine-prilocaine (EMLA) cream APPLY 1 APPLICATION TOPICALLY AS NEEDED **APPLY TO PORT 1-2 HOURS PRIOR TO APPOINTMENT Patient taking  differently: Apply 1 Application topically See admin instructions. Apply to port 1-2 hours prior to appointment 11/13/23  Yes Ladene Artist, MD  methocarbamol (ROBAXIN) 500 MG tablet Take 1 tablet (500 mg total) by mouth 3 (three) times daily as needed for muscle spasms. 12/09/23  Yes Rana Snare, NP  ondansetron (ZOFRAN) 8 MG tablet Take 1 tablet (8 mg total) by mouth every 8 (eight) hours as needed for nausea or vomiting. 01/22/24  Yes Ladene Artist, MD  oxyCODONE (OXY IR/ROXICODONE) 5 MG immediate release tablet Take 5-10 mg by mouth every 6 (six) hours as needed (for pain).   Yes [provider]  polyethylene glycol (MIRALAX / GLYCOLAX) 17 g packet Take 17 g by mouth daily as needed for mild constipation. 07/12/23  Yes Regalado, Belkys A, MD  prochlorperazine (COMPAZINE) 5 MG tablet Take 1-2 tablets (5-10 mg total) by mouth every 6 (six) hours as needed for nausea or vomiting. 02/05/24  Yes Ladene Artist, MD  simvastatin (ZOCOR) 40 MG tablet Take 0.5 tablets (20 mg total) by mouth daily with supper. Take 1/2 pill. Patient taking differently: Take 20 mg by mouth every evening. 01/05/24  Yes [name], [name] M, MD  sodium chloride 1 g tablet Take 1 tablet (1 g total) by mouth 2 (two) times daily  with a meal. 02/20/24  Yes Ladene Artist, MD  traMADol (ULTRAM) 50 MG tablet Take 1 tablet (50 mg total) by mouth every 6 (six) hours as needed. Do not take with oxycodone Patient taking differently: Take 50 mg by mouth every 6 (six) hours as needed ("do not take with oxycodone"). 02/05/24  Yes Ladene Artist, MD    Inpatient Medications: Scheduled Meds:  Chlorhexidine Gluconate Cloth  6 each Topical Daily   cyanocobalamin  1,000 mcg Oral Daily   docusate sodium  100 mg Oral BID   enoxaparin (LOVENOX) injection  40 mg Subcutaneous Q24H   famotidine  40 mg Oral Daily   feeding supplement  237 mL Oral TID BM   ferrous sulfate  325 mg Oral Q breakfast   furosemide  40 mg Intravenous Daily    metoprolol succinate  12.5 mg Oral Daily   multivitamin  1 tablet Oral Daily   simvastatin  20 mg Oral Q supper   sodium chloride flush  10-40 mL Intracatheter Q12H   Continuous Infusions:  PRN Meds: acetaminophen **OR** acetaminophen, ondansetron **OR** ondansetron (ZOFRAN) IV, oxyCODONE, polyethylene glycol, senna-docusate, sodium chloride flush, traMADol  Allergies:    Allergies  Allergen Reactions   Bactrim [Sulfamethoxazole-Trimethoprim] Nausea And Vomiting and Other (See Comments)    "Knocked her for a loop"- dizziness and weakness, also   Hydromorphone Other (See Comments)    Was too strong at 2 mg   Protonix [Pantoprazole] Rash    Social History:   Social History   Socioeconomic History   Marital status: Married    Spouse name: Not on file   Number of children: Not on file   Years of education: 1   Highest education level: Not on file  Occupational History   Not on file  Tobacco Use   Smoking status: Never   Smokeless tobacco: Never  Substance and Sexual Activity   Alcohol use: No   Drug use: No   Sexual activity: Not on file  Other Topics Concern   Not on file  Social History Narrative   Not on file   Social Drivers of Health   Financial Resource Strain: Low Risk  (08/05/2023)   Overall Financial Resource Strain (CARDIA)    Difficulty of Paying Living Expenses: Not hard at all  Food Insecurity: No Food Insecurity (02/24/2024)   Hunger Vital Sign    Worried About Running Out of Food in the Last Year: Never true    Ran Out of Food in the Last Year: Never true  Transportation Needs: No Transportation Needs (02/24/2024)   PRAPARE - Administrator, Civil Service (Medical): No    Lack of Transportation (Non-Medical): No  Physical Activity: Not on file  Stress: Not on file  Social Connections: Not on file  Intimate Partner Violence: Not At Risk (02/24/2024)   Humiliation, Afraid, Rape, and Kick questionnaire    Fear of Current or Ex-Partner: No     Emotionally Abused: No    Physically Abused: No    Sexually Abused: No    Family History:    Family History  Problem Relation Age of Onset   Uterine cancer Mother        dx in her 43s   Pancreatic cancer Father    Cerebral aneurysm Sister    Melanoma Sister 81 - 80       on her leg   Sarcoidosis Sister 58   Lung cancer Brother  smoker   Pancreatic cancer Niece 52 - 31       paternal half brother's daughter   Lung cancer Niece 37 - 43       smoker     ROS:  Please see the history of present illness.   All other ROS reviewed and negative.     Physical Exam/Data:   Vitals:   02/24/24 0451 02/24/24 1316 02/24/24 2113 02/25/24 0511  BP: (!) 112/53 119/63 127/65 (!) 111/59  Pulse: 93 76 (!) 102 (!) 102  Resp: 19 19 18 16   Temp: 98.2 F (36.8 C) (!) 97.5 F (36.4 C) 98.5 F (36.9 C) 97.7 F (36.5 C)  TempSrc: Oral Axillary Oral Oral  SpO2: 98% 94% 100% 100%  Weight:      Height:        Intake/Output Summary (Last 24 hours) at 02/25/2024 0935 Last data filed at 02/25/2024 0600 Gross per 24 hour  Intake 240 ml  Output 1000 ml  Net -760 ml      02/24/2024    1:53 AM 02/23/2024    2:39 PM 02/19/2024   10:11 AM  Last 3 Weights  Weight (lbs) 125 lb 125 lb 116 lb  Weight (kg) 56.7 kg 56.7 kg 52.617 kg     Body mass index is 20.18 kg/m.  General:  Thin elderly female. Sitting upright in the armchair eating breakfast. No acute distress  HEENT: normal Neck: no JVD Vascular: Radial pulses 2+ bilaterally Cardiac:  normal S1, S2; irregular rate and rhythm  Lungs:  clear to auscultation bilaterally, no wheezing, rhonchi or rales. Normal WOB on room air  Abd: soft, nontender  Ext: 3+ edema in BLE. Hands also edematous and finger tips are dark blue  Musculoskeletal:  No deformities  Skin: warm and dry  Neuro:   no focal abnormalities noted Psych:  Normal affect   EKG:  The EKG was personally reviewed and demonstrates:   EKG showed atrial fibrillation with HR  105 BPM Telemetry:  Telemetry was personally reviewed and demonstrates:  atrial fibrillation with HR in the 90s-100s   Relevant CV Studies:  Echocardiogram 02/24/24  1. Left ventricular ejection fraction, by estimation, is 40 to 45%. The  left ventricle has mildly decreased function. The left ventricle  demonstrates regional wall motion abnormalities (see scoring  diagram/findings for description). Left ventricular  diastolic parameters are consistent with Grade I diastolic dysfunction  (impaired relaxation).   2. Right ventricular systolic function is mildly reduced. The right  ventricular size is normal. There is normal pulmonary artery systolic  pressure.   3. Left atrial size was mildly dilated.   4. Right atrial size was mildly dilated.   5. The mitral valve is normal in structure. Mild mitral valve  regurgitation. No evidence of mitral stenosis.   6. The aortic valve is tricuspid. Aortic valve regurgitation is moderate.  No aortic stenosis is present.   7. The inferior vena cava is normal in size with greater than 50%  respiratory variability, suggesting right atrial pressure of 3 mmHg.   Laboratory Data:  High Sensitivity Troponin:  No results for input(s): "TROPONINIHS" in the last 720 hours.   Chemistry Recent Labs  Lab 02/19/24 1120 02/23/24 1345 02/24/24 0309 02/25/24 0318  NA 125* 126* 128* 131*  K 4.2 4.1 3.2* 4.0  CL 93* 94* 96* 95*  CO2 25 26 25 28   GLUCOSE 143* 165* 145* 147*  BUN 21 25* 28* 29*  CREATININE 0.46 0.36* 0.40* 0.37*  CALCIUM 8.1* 8.3* 7.9* 8.2*  MG 1.5* 1.8 1.9  --   GFRNONAA >60 >60 >60 >60  ANIONGAP 7 6 7 8     Recent Labs  Lab 02/23/24 1345 02/24/24 0309 02/25/24 0318  PROT 4.7* 4.0* 4.2*  ALBUMIN 2.5* 1.7* 1.7*  AST 52* 45* 42*  ALT 33 34 31  ALKPHOS 217* 189* 180*  BILITOT 0.5 0.4 0.5   Lipids No results for input(s): "CHOL", "TRIG", "HDL", "LABVLDL", "LDLCALC", "CHOLHDL" in the last 168 hours.  Hematology Recent Labs  Lab  02/23/24 1345 02/24/24 0309 02/25/24 0318  WBC 7.7 5.9 5.7  RBC 4.15 3.62* 3.54*  HGB 11.8* 10.4* 10.1*  HCT 35.6* 31.4* 31.1*  MCV 85.8 86.7 87.9  MCH 28.4 28.7 28.5  MCHC 33.1 33.1 32.5  RDW 21.9* 21.9* 22.5*  PLT 195 167 184   Thyroid No results for input(s): "TSH", "FREET4" in the last 168 hours.  BNP Recent Labs  Lab 02/23/24 1345  BNP 614.7*    DDimer No results for input(s): "DDIMER" in the last 168 hours.   Radiology/Studies:  VAS [place] UPPER EXTREMITY ARTERIAL DUPLEX Result Date: 02/24/2024  UPPER EXTREMITY DUPLEX STUDY Patient Name:  CASHLYN HUGULEY  Date of Exam:   02/24/2024 Medical Rec #: [id number]         Accession #:    [id number] Date of Birth: 1934-01-05         Patient Gender: F Patient Age:   35 years Exam Location:  [facility] Procedure:      VAS [place] UPPER EXTREMITY ARTERIAL DUPLEX Referring Phys: Joen Laura --------------------------------------------------------------------------------  Indications: Cyanosis.  Risk Factors: Hypertension, hyperlipidemia. Comparison Study: No prior studies. Performing Technologist: Chanda Busing RVT  Examination Guidelines: A complete evaluation includes B-mode imaging, spectral Doppler, color Doppler, and power Doppler as needed of all accessible portions of each vessel. Bilateral testing is considered an integral part of a complete examination. Limited examinations for reoccurring indications may be performed as noted.  Right Doppler Findings: +---------------+----------+-----------+--------+--------+ Site           PSV (cm/s)Waveform   StenosisComments +---------------+----------+-----------+--------+--------+ Subclavian Mid 58        triphasic                   +---------------+----------+-----------+--------+--------+ Subclavian Dist          Multiphasic                 +---------------+----------+-----------+--------+--------+ Axillary       93        triphasic                    +---------------+----------+-----------+--------+--------+ Brachial Prox  109       Multiphasic                 +---------------+----------+-----------+--------+--------+ Brachial Dist  105       Multiphasic                 +---------------+----------+-----------+--------+--------+ Radial Prox    78        Multiphasic                 +---------------+----------+-----------+--------+--------+ Radial Mid     75        Multiphasic                 +---------------+----------+-----------+--------+--------+ Radial Dist    81        Multiphasic                 +---------------+----------+-----------+--------+--------+  Ulnar Prox     60        Multiphasic                 +---------------+----------+-----------+--------+--------+ Ulnar Mid      39        Multiphasic                 +---------------+----------+-----------+--------+--------+ Ulnar Dist     32        Multiphasic                 +---------------+----------+-----------+--------+--------+    Left Doppler Findings: +--------------+----------+-----------+--------+--------+ Site          PSV (cm/s)Waveform   StenosisComments +--------------+----------+-----------+--------+--------+ Subclavian Mid92        Multiphasic                 +--------------+----------+-----------+--------+--------+ Axillary      82        Multiphasic                 +--------------+----------+-----------+--------+--------+ Brachial Prox 93        Multiphasic                 +--------------+----------+-----------+--------+--------+ Brachial Dist 117       Multiphasic                 +--------------+----------+-----------+--------+--------+ Radial Prox   66        Multiphasic                 +--------------+----------+-----------+--------+--------+ Radial Mid    68        Multiphasic                 +--------------+----------+-----------+--------+--------+ Radial Dist   75        Multiphasic                  +--------------+----------+-----------+--------+--------+ Ulnar Prox    35        Multiphasic                 +--------------+----------+-----------+--------+--------+ Ulnar Mid     32        Multiphasic                 +--------------+----------+-----------+--------+--------+ Ulnar Dist    34        Multiphasic                 +--------------+----------+-----------+--------+--------+   Summary:  Right: No obstruction visualized in the right upper extremity. Left: No obstruction visualized in the left upper extremity. *See table(s) above for measurements and observations.    Preliminary    ECHOCARDIOGRAM COMPLETE Result Date: 02/24/2024    ECHOCARDIOGRAM REPORT   Patient Name:   LETITA PRENTISS Date of Exam: 02/24/2024 Medical Rec #:  [id number]        Height:       66.0 in Accession #:    [id number]       Weight:       125.0 lb Date of Birth:  1934/02/27        BSA:          1.638 m Patient Age:    89 years         BP:           112/53 mmHg Patient Gender: F                HR:  100 bpm. Exam Location:  Inpatient Procedure: 2D Echo, Cardiac Doppler and Color Doppler (Both Spectral and Color            Flow Doppler were utilized during procedure). Indications:    Dyspnea R06.00  History:        Patient has prior history of Echocardiogram examinations, most                 recent 10/14/2019. Arrythmias:Atrial Fibrillation; Risk                 Factors:Hypertension and Dyslipidemia.  Sonographer:    Araceli Bouche Referring Phys: [id number] PROSPER M AMPONSAH IMPRESSIONS  1. Left ventricular ejection fraction, by estimation, is 40 to 45%. The left ventricle has mildly decreased function. The left ventricle demonstrates regional wall motion abnormalities (see scoring diagram/findings for description). Left ventricular diastolic parameters are consistent with Grade I diastolic dysfunction (impaired relaxation).  2. Right ventricular systolic function is mildly reduced. The right ventricular  size is normal. There is normal pulmonary artery systolic pressure.  3. Left atrial size was mildly dilated.  4. Right atrial size was mildly dilated.  5. The mitral valve is normal in structure. Mild mitral valve regurgitation. No evidence of mitral stenosis.  6. The aortic valve is tricuspid. Aortic valve regurgitation is moderate. No aortic stenosis is present.  7. The inferior vena cava is normal in size with greater than 50% respiratory variability, suggesting right atrial pressure of 3 mmHg. FINDINGS  Left Ventricle: Left ventricular ejection fraction, by estimation, is 40 to 45%. The left ventricle has mildly decreased function. The left ventricle demonstrates regional wall motion abnormalities. The left ventricular internal cavity size was normal in size. There is no left ventricular hypertrophy. Left ventricular diastolic parameters are consistent with Grade I diastolic dysfunction (impaired relaxation).  LV Wall Scoring: Anterior and anterolateral wall motion abnormalities. Right Ventricle: The right ventricular size is normal. No increase in right ventricular wall thickness. Right ventricular systolic function is mildly reduced. There is normal pulmonary artery systolic pressure. The tricuspid regurgitant velocity is 2.42 m/s, and with an assumed right atrial pressure of 3 mmHg, the estimated right ventricular systolic pressure is 26.4 mmHg. Left Atrium: Left atrial size was mildly dilated. Right Atrium: Right atrial size was mildly dilated. Pericardium: Trivial pericardial effusion is present. Mitral Valve: The mitral valve is normal in structure. Mild mitral valve regurgitation. No evidence of mitral valve stenosis. Tricuspid Valve: The tricuspid valve is normal in structure. Tricuspid valve regurgitation is mild . No evidence of tricuspid stenosis. Aortic Valve: The aortic valve is tricuspid. Aortic valve regurgitation is moderate. Aortic regurgitation PHT measures 334 msec. No aortic stenosis is  present. Pulmonic Valve: The pulmonic valve was normal in structure. Pulmonic valve regurgitation is trivial. No evidence of pulmonic stenosis. Aorta: The aortic root is normal in size and structure. Venous: The inferior vena cava is normal in size with greater than 50% respiratory variability, suggesting right atrial pressure of 3 mmHg. IAS/Shunts: No atrial level shunt detected by color flow Doppler. Additional Comments: 3D imaging was not performed.  LEFT VENTRICLE PLAX 2D LVIDd:         3.30 cm LVIDs:         2.80 cm LV PW:         1.20 cm LV IVS:        1.20 cm LVOT diam:     1.70 cm LV SV:         39 LV  SV Index:   24 LVOT Area:     2.27 cm  RIGHT VENTRICLE            IVC RV S prime:     8.81 cm/s  IVC diam: 1.40 cm TAPSE (M-mode): 1.7 cm LEFT ATRIUM           Index        RIGHT ATRIUM           Index LA diam:      2.80 cm 1.71 cm/m   RA Area:     18.40 cm LA Vol (A2C): 35.9 ml 21.92 ml/m  RA Volume:   43.60 ml  26.62 ml/m LA Vol (A4C): 45.9 ml 28.03 ml/m  AORTIC VALVE             PULMONIC VALVE LVOT Vmax:   97.90 cm/s  PV Vmax:       0.74 m/s LVOT Vmean:  60.300 cm/s PV Peak grad:  2.2 mmHg LVOT VTI:    0.170 m AI PHT:      334 msec  AORTA Ao Root diam: 3.00 cm Ao Asc diam:  3.00 cm TRICUSPID VALVE TR Peak grad:   23.4 mmHg TR Vmax:        242.00 cm/s  SHUNTS Systemic VTI:  0.17 m Systemic Diam: 1.70 cm Clearnce Hasten Electronically signed by Clearnce Hasten Signature Date/Time: 02/24/2024/10:04:42 AM    Final    DG Chest Port 1 View Result Date: 02/23/2024 CLINICAL DATA:  Dyspnea EXAM: PORTABLE CHEST 1 VIEW COMPARISON:  08/04/2023 FINDINGS: Single frontal view of the chest demonstrates stable right chest wall port. The cardiac silhouette is stable. No acute airspace disease, effusion, or pneumothorax. No acute bony abnormalities. IMPRESSION: 1. No acute intrathoracic process. Electronically Signed   By: Sharlet Salina M.D.   On: 02/23/2024 23:19     Assessment and Plan:   Acute HFmrEF  -  Patient presented with generalized swelling, shortness of breath. BNP elevated to 614 - Echocardiogram showed EF 40-45% with regional wall motion abnormalities, grade I DD, mildly reduced RV function, mild MR, moderate AI  - Patient is on IV lasix 40 mg daily- put out 1.0 L urine yesterday. Renal function stable. Na levels improving, up to 131 today. She continues to have edema on exam, likely due to both CHF and hypoalbuminemia  - continue IV lasix  - Patient denies chest pain/anginal symptoms. With her comorbidities and lack of chest pain, no plans for ischemic evaluation at this time. Reduced EF could be due to recurrence of afib. Could also be stress cardiomyopathy  - Start metoprolol succinate 12.5 mg daily  - Additional GDMT limited by low BP   PAF  - Patient has not been on [facility] since she was diagnosed with pancreatic cancer in 06/2023 due to concerns of bleeding risk  - Currently in afib with HR in the 90s-low 100s - Start metoprolol succinate 12.5 mg daily as above for HR control  Otherwise per primary  - Hyponatremia - improving with diuresis  - Hypokalemia  - Cancer of head of pancreas - Cyanosis of hands  - Severe protein-calorie malnutrition, failure to thrive - Normocytic anemia   Risk Assessment/Risk Scores:   New York Heart Association (NYHA) Functional Class NYHA Class IV  CHA2DS2-VASc Score = 5  This indicates a 7.2% annual risk of stroke. The patient's score is based upon: CHF History: 1 HTN History: 1 Diabetes History: 0 Stroke History: 0 Vascular Disease History: 0 Age  Score: 2 Gender Score: 1     For questions or updates, please contact [facility] Please consult [email or web address] for contact info under    Signed, Jonita Albee, PA-C  02/25/2024 9:35 AM

## 2024-02-25 NOTE — Evaluation (Signed)
 Occupational Therapy Evaluation Patient Details Name: Wendy [name] MRN: [id number] DOB: 1934-10-26 Today's Date: 02/25/2024   History of Present Illness   Wendy [name] is a 89 y.o. female directly admitted 02/23/24 by the cancer center for generalized weakness/fatigue and worsening generalized edema. Dx with acute CHF. PMH includes adenocarcinoma of the pancreas status post completion of radiation/capecitabine on 02/05/24, A-fib, GERD, HLD, HTN, vitamin D deficiency, anemia and chronic hyponatremia     Clinical Impressions Pt is typically supervision/close guard assist (CGA) for mobility without DME, she gets similar assist from family for bathing/dressing and does her own grooming. Presents with generalized weakness, decreased activity tolerance, and balance, and edema in BLE as well as hands. Educated in edema management (elevation and exercises)  Today she requires extra time and endorses fatigue - but willing and motivated to get out of bed. 1 person HHA with min A for step pivot to recliner, set up for UB dressing, mod A for LB dressing, set up for seated grooming tasks - HR high 80's to low 100's with activity and SpO2 >90% on RA with no SOB or DOE. Recommending continued skilled OT in the acute setting as well as afterwards at the [facility] level to maximize safety and independence in ADL and functional transfers. She has excellent family support.     If plan is discharge home, recommend the following:   A little help with walking and/or transfers;A lot of help with bathing/dressing/bathroom;Assist for transportation;Help with stairs or ramp for entrance     Functional Status Assessment   Patient has had a recent decline in their functional status and demonstrates the ability to make significant improvements in function in a reasonable and predictable amount of time.     Equipment Recommendations   None recommended by OT (PT has appropriate DME)     Recommendations for Other  Services   PT consult     Precautions/Restrictions   Precautions Precautions: Fall Restrictions Weight Bearing Restrictions Per Provider Order: No     Mobility Bed Mobility Overal bed mobility: Needs Assistance Bed Mobility: Supine to Sit     Supine to sit: Min assist, HOB elevated, Used rails     General bed mobility comments: increased time and effort, used bed pad to assist with getting hips to the EOB    Transfers Overall transfer level: Needs assistance Equipment used: 1 person hand held assist Transfers: Sit to/from Stand, Bed to chair/wheelchair/BSC Sit to Stand: Min assist, From elevated surface (bed up high)     Step pivot transfers: Min assist, From elevated surface     General transfer comment: increased time and effort for all movement,      Balance Overall balance assessment: Needs assistance Sitting-balance support: No upper extremity supported, Feet supported Sitting balance-Leahy Scale: Good     Standing balance support: Single extremity supported, During functional activity Standing balance-Leahy Scale: Poor (progressing towards fair)                             ADL either performed or assessed with clinical judgement   ADL Overall ADL's : Needs assistance/impaired Eating/Feeding: Set up   Grooming: Set up;Sitting Grooming Details (indicate cue type and reason): decreased activity tolerance Upper Body Bathing: Minimal assistance;Sitting   Lower Body Bathing: Minimal assistance;Sitting/lateral leans   Upper Body Dressing : Set up;Sitting Upper Body Dressing Details (indicate cue type and reason): extra gown like robe Lower Body Dressing: Moderate assistance  Toilet Transfer: Minimal Cabin crew Details (indicate cue type and reason): simulated through recliner transfer Toileting- Clothing Manipulation and Hygiene: Minimal assistance;Sit to/from stand Toileting - Clothing Manipulation Details  (indicate cue type and reason): has on depends and purewick Tub/ Shower Transfer: Walk-in shower;Minimal assistance;Shower seat (getting grab bars installed)   Functional mobility during ADLs: Minimal assistance (1 person face to face for SPT Only) General ADL Comments: decreased activity tolerance, generalized weakness, edema present in hands and feet     Vision Patient Visual Report: No change from baseline       Perception         Praxis         Pertinent Vitals/Pain Pain Assessment Pain Assessment: Faces Faces Pain Scale: Hurts a little bit Pain Location: generalized Pain Descriptors / Indicators: Discomfort, Sore Pain Intervention(s): Limited activity within patient's tolerance, Monitored during session, Repositioned     Extremity/Trunk Assessment Upper Extremity Assessment Upper Extremity Assessment: Generalized weakness (Bil Hand edema with discoloration in finger tips (deep blue/purple))   Lower Extremity Assessment Lower Extremity Assessment: Defer to PT evaluation (in compression socks)   Cervical / Trunk Assessment Cervical / Trunk Assessment: Kyphotic;Other exceptions Cervical / Trunk Exceptions: hx of back pain since chemo/radiation   Communication Communication Communication: No apparent difficulties Factors Affecting Communication: Other (comment) (sometimes delayed response)   Cognition Arousal: Alert Behavior During Therapy: Flat affect Cognition: History of cognitive impairments (since chemo/radiation)             OT - Cognition Comments: Pt pleasant, cooperative, NOT hard of hearing - but sometimes delayed response. Sometimes needed help with basic set up questions from daughter (who lets her mom answer - but is ready to help).                 Following commands: Intact       Cueing  General Comments   Cueing Techniques: Verbal cues      Exercises     Shoulder Instructions      Home Living Family/patient expects to be  discharged to:: Private residence Living Arrangements: Spouse/significant other;Children (daughter and son in law living with them due to [place]) Available Help at Discharge: Family;Available 24 hours/day Type of Home: House Home Access: Stairs to enter [employer] of Steps: 6 Entrance Stairs-Rails: Right Home Layout: Two level;Able to live on main level with bedroom/bathroom     Bathroom Shower/Tub: Producer, television/film/video: Handicapped height Bathroom Accessibility: Yes How Accessible: Accessible via walker Home Equipment: Shower seat;Rolling Walker (2 wheels);BSC/3in1;Cane - single point;Wheelchair - manual   Additional Comments: Pt worked at [employer] as a Designer, television/film set and likes to [employer]      Prior Functioning/Environment Prior Level of Function : Needs assist             Mobility Comments: furniture surfs, daughter or son-in-law are always right by her side ADLs Comments: assist from family - bathing/dressing largely set up level    OT Problem List: Decreased strength;Decreased activity tolerance;Impaired balance (sitting and/or standing);Decreased knowledge of use of DME or AE;Cardiopulmonary status limiting activity;Increased edema   OT Treatment/Interventions: Self-care/ADL training;Energy conservation;DME and/or AE instruction;Therapeutic activities;Patient/family education;Balance training      OT Goals(Current goals can be found in the care plan section)   Acute Rehab OT Goals Patient Stated Goal: have more energy, move easier OT Goal Formulation: With patient/family Time For Goal Achievement: 03/10/24 Potential to Achieve Goals: Good ADL Goals Pt Will Perform Grooming: with supervision;standing Pt Will  Perform Upper Body Dressing: with set-up;sitting Pt Will Perform Lower Body Dressing: with supervision;sit to/from stand;with caregiver independent in assisting Pt Will Transfer to Toilet: with contact guard assist;ambulating Pt Will  Perform Toileting - Clothing Manipulation and hygiene: with set-up;sitting/lateral leans Additional ADL Goal #1: Pt will verbalize at least 3 strategies for energy conservation during ADL with no cues   OT Frequency:  Min 1X/week    Co-evaluation              AM-PAC OT "6 Clicks" Daily Activity     Outcome Measure Help from another person eating meals?: None Help from another person taking care of personal grooming?: A Little Help from another person toileting, which includes using toliet, bedpan, or urinal?: A Little Help from another person bathing (including washing, rinsing, drying)?: A Little Help from another person to put on and taking off regular upper body clothing?: A Little Help from another person to put on and taking off regular lower body clothing?: A Little 6 Click Score: 19   End of Session Equipment Utilized During Treatment: Gait belt Nurse Communication: Mobility status  Activity Tolerance: Patient tolerated treatment well Patient left: in chair;with call bell/phone within reach;with family/visitor present  OT Visit Diagnosis: Muscle weakness (generalized) (M62.81);Other abnormalities of gait and mobility (R26.89);Other (comment) (cadriopulmonary status limitiung activity)                Time: 1914-7829 OT Time Calculation (min): 29 min Charges:  OT General Charges $OT Visit: 1 Visit OT Evaluation $OT Eval Moderate Complexity: 1 Mod OT Treatments $Self Care/Home Management : 8-22 mins  Nyoka Cowden OTR/L Acute Rehabilitation Services Office: [phone]  [facility] 02/25/2024, 9:57 AM

## 2024-02-25 NOTE — Progress Notes (Signed)
 PROGRESS NOTE  Wendy [name] ZOX:[id number] DOB: 09-Feb-1934 DOA: 02/23/2024 PCP: [name], MD   LOS: 2 days   Brief Narrative / Interim history: 89 year old female with pancreatic cancer status post surgery, palliative chemotherapy as well as radiation, history of paroxysmal A-fib not on anticoagulation, hyperlipidemia who comes into the hospital with generalized edema, weight gain.  Was found to have anasarca.  A 2D echo showed depressed EF with wall motion abnormalities, cardiology was consulted.  She was placed on diuretics  Subjective / 24h Interval events: Feels a little bit stronger today.  Feels like her swelling is gone down.  Daughter is at bedside  Assesement and Plan: Principal Problem:   Generalized edema Active Problems:   Hyponatremia   Cancer of head of pancreas (HCC)   Hyperlipidemia   Normocytic anemia   Paroxysmal atrial fibrillation (HCC)   Failure to thrive in adult   Protein-calorie malnutrition, severe  Principal problem Acute on chronic systolic CHF-anasarca -patient admitted to the hospital with fluid overload.  She was placed on IV Lasix, continue.  A 2D echo showed LVEF 40-45% with wall motion abnormalities.  Cardiology consulted, appreciate input.  Active problems Pancreatic cancer-diagnosed in July 2024, complicated by gastric outlet obstruction requiring duodenal stent and biliary stent.  Did go for Whipple, but this could not be completed due to involvement of the SMV, but did have partial surgery, cholecystectomy -Status post chemo and radiation therapy with some treatment response.  Oncology following  Severe protein calorie malnutrition, FTT-continue nutritional supplements  Purplish discoloration of fingertips-improved, normal flow per arterial duplex  PAF-not on anticoagulation, cardiology seen  Hyperlipidemia-continue statin  Normocytic anemia-due to cancer, continue iron supplementation  Scheduled Meds:  Chlorhexidine Gluconate  Cloth  6 each Topical Daily   cyanocobalamin  1,000 mcg Oral Daily   docusate sodium  100 mg Oral BID   enoxaparin (LOVENOX) injection  40 mg Subcutaneous Q24H   famotidine  40 mg Oral Daily   feeding supplement  237 mL Oral TID BM   ferrous sulfate  325 mg Oral Q breakfast   furosemide  40 mg Intravenous Daily   metoprolol succinate  12.5 mg Oral Daily   multivitamin  1 tablet Oral Daily   simvastatin  20 mg Oral Q supper   sodium chloride flush  10-40 mL Intracatheter Q12H   Continuous Infusions: PRN Meds:.acetaminophen **OR** acetaminophen, ondansetron **OR** ondansetron (ZOFRAN) IV, oxyCODONE, polyethylene glycol, senna-docusate, sodium chloride flush, traMADol  Current Outpatient Medications  Medication Instructions   cyanocobalamin (VITAMIN B12) 1,000 mcg, Oral, Every morning   docusate sodium (COLACE) 100 mg, Oral, 2 times daily   famotidine (PEPCID) 40 mg, Oral, Daily before breakfast   ferrous sulfate 325 mg, Oral, Daily with breakfast   lidocaine-prilocaine (EMLA) cream APPLY 1 APPLICATION TOPICALLY AS NEEDED **APPLY TO PORT 1-2 HOURS PRIOR TO APPOINTMENT   methocarbamol (ROBAXIN) 500 mg, Oral, 3 times daily PRN   ondansetron (ZOFRAN) 8 mg, Oral, Every 8 hours PRN   oxyCODONE (OXY IR/ROXICODONE) 5-10 mg, Oral, Every 6 hours PRN   polyethylene glycol (MIRALAX / GLYCOLAX) 17 g, Oral, Daily PRN   prochlorperazine (COMPAZINE) 5-10 mg, Oral, Every 6 hours PRN   simvastatin (ZOCOR) 20 mg, Oral, Daily with supper, Take 1/2 pill.   sodium chloride 1 g, Oral, 2 times daily with meals   traMADol (ULTRAM) 50 mg, Oral, Every 6 hours PRN, Do not take with oxycodone    Diet Orders (From admission, onward)     Start  Ordered   02/24/24 1452  Diet Heart Room service appropriate? Yes; Fluid consistency: Thin  Diet effective now       Question Answer Comment  Room service appropriate? Yes   Fluid consistency: Thin      02/24/24 1451            DVT prophylaxis: Place TED  hose Start: 02/23/24 2341 enoxaparin (LOVENOX) injection 40 mg Start: 02/23/24 2200   Lab Results  Component Value Date   PLT 184 02/25/2024      Code Status: Limited: Do not attempt resuscitation (DNR) -DNR-LIMITED -Do Not Intubate/DNI   Family Communication: Daughter at bedside  Status is: Inpatient Remains inpatient appropriate because: Severity of illness   Level of care: Telemetry  Consultants:  Oncology Cardiology  Objective: Vitals:   02/24/24 1316 02/24/24 2113 02/25/24 0511 02/25/24 1051  BP: 119/63 127/65 (!) 111/59 (!) 134/59  Pulse: 76 (!) 102 (!) 102 (!) 55  Resp: 19 18 16 16   Temp: (!) 97.5 F (36.4 C) 98.5 F (36.9 C) 97.7 F (36.5 C) 97.8 F (36.6 C)  TempSrc: Axillary Oral Oral Oral  SpO2: 94% 100% 100% 99%  Weight:      Height:        Intake/Output Summary (Last 24 hours) at 02/25/2024 1104 Last data filed at 02/25/2024 0600 Gross per 24 hour  Intake 240 ml  Output 1000 ml  Net -760 ml   Wt Readings from Last 3 Encounters:  02/24/24 56.7 kg  02/23/24 56.7 kg  02/19/24 52.6 kg    Examination:  Constitutional: NAD Eyes: no scleral icterus ENMT: Mucous membranes are moist.  Neck: normal, supple Respiratory: clear to auscultation bilaterally, no wheezing, no crackles. Normal respiratory effort. No accessory muscle use.  Cardiovascular: Regular rate and rhythm, no murmurs / rubs / gallops.  1+ lower extremity edema Abdomen: non distended, no tenderness. Bowel sounds positive.  Musculoskeletal: no clubbing / cyanosis.    Data Reviewed: I have independently reviewed following labs and imaging studies   CBC Recent Labs  Lab 02/23/24 1345 02/24/24 0309 02/25/24 0318  WBC 7.7 5.9 5.7  HGB 11.8* 10.4* 10.1*  HCT 35.6* 31.4* 31.1*  PLT 195 167 184  MCV 85.8 86.7 87.9  MCH 28.4 28.7 28.5  MCHC 33.1 33.1 32.5  RDW 21.9* 21.9* 22.5*  LYMPHSABS 0.4*  --   --   MONOABS 0.4  --   --   EOSABS 0.0  --   --   BASOSABS 0.0  --   --      Recent Labs  Lab 02/19/24 1120 02/23/24 1345 02/24/24 0309 02/25/24 0318  NA 125* 126* 128* 131*  K 4.2 4.1 3.2* 4.0  CL 93* 94* 96* 95*  CO2 25 26 25 28   GLUCOSE 143* 165* 145* 147*  BUN 21 25* 28* 29*  CREATININE 0.46 0.36* 0.40* 0.37*  CALCIUM 8.1* 8.3* 7.9* 8.2*  AST 45* 52* 45* 42*  ALT 28 33 34 31  ALKPHOS 222* 217* 189* 180*  BILITOT 0.7 0.5 0.4 0.5  ALBUMIN 2.5* 2.5* 1.7* 1.7*  MG 1.5* 1.8 1.9  --   BNP  --  614.7*  --   --     ------------------------------------------------------------------------------------------------------------------ No results for input(s): "CHOL", "HDL", "LDLCALC", "TRIG", "CHOLHDL", "LDLDIRECT" in the last 72 hours.  No results found for: "HGBA1C" ------------------------------------------------------------------------------------------------------------------ No results for input(s): "TSH", "T4TOTAL", "T3FREE", "THYROIDAB" in the last 72 hours.  Invalid input(s): "FREET3"  Cardiac Enzymes No results for input(s): "CKMB", "TROPONINI", "  MYOGLOBIN" in the last 168 hours.  Invalid input(s): "CK" ------------------------------------------------------------------------------------------------------------------    Component Value Date/Time   BNP 614.7 (H) 02/23/2024 1345    CBG: No results for input(s): "GLUCAP" in the last 168 hours.  No results found for this or any previous visit (from the past 240 hours).   Radiology Studies: VAS [place] UPPER EXTREMITY ARTERIAL DUPLEX Result Date: 02/24/2024  UPPER EXTREMITY DUPLEX STUDY Patient Name:  GRACEE RATTERREE  Date of Exam:   02/24/2024 Medical Rec #: [id number]         Accession #:    [id number] Date of Birth: 09-21-1934         Patient Gender: F Patient Age:   83 years Exam [facility]  [facility] Procedure:      VAS [place] UPPER EXTREMITY ARTERIAL DUPLEX Referring Phys: Joen Laura --------------------------------------------------------------------------------  Indications:  Cyanosis.  Risk Factors: Hypertension, hyperlipidemia. Comparison Study: No prior studies. Performing Technologist: Chanda Busing RVT  Examination Guidelines: A complete evaluation includes B-mode imaging, spectral Doppler, color Doppler, and power Doppler as needed of all accessible portions of each vessel. Bilateral testing is considered an integral part of a complete examination. Limited examinations for reoccurring indications may be performed as noted.  Right Doppler Findings: +---------------+----------+-----------+--------+--------+ Site           PSV (cm/s)Waveform   StenosisComments +---------------+----------+-----------+--------+--------+ Subclavian Mid 58        triphasic                   +---------------+----------+-----------+--------+--------+ Subclavian Dist          Multiphasic                 +---------------+----------+-----------+--------+--------+ Axillary       93        triphasic                   +---------------+----------+-----------+--------+--------+ Brachial Prox  109       Multiphasic                 +---------------+----------+-----------+--------+--------+ Brachial Dist  105       Multiphasic                 +---------------+----------+-----------+--------+--------+ Radial Prox    78        Multiphasic                 +---------------+----------+-----------+--------+--------+ Radial Mid     75        Multiphasic                 +---------------+----------+-----------+--------+--------+ Radial Dist    81        Multiphasic                 +---------------+----------+-----------+--------+--------+ Ulnar Prox     60        Multiphasic                 +---------------+----------+-----------+--------+--------+ Ulnar Mid      39        Multiphasic                 +---------------+----------+-----------+--------+--------+ Ulnar Dist     32        Multiphasic                  +---------------+----------+-----------+--------+--------+    Left Doppler Findings: +--------------+----------+-----------+--------+--------+ Site          PSV (cm/s)Waveform   StenosisComments +--------------+----------+-----------+--------+--------+ Subclavian Mid92  Multiphasic                 +--------------+----------+-----------+--------+--------+ Axillary      82        Multiphasic                 +--------------+----------+-----------+--------+--------+ Brachial Prox 93        Multiphasic                 +--------------+----------+-----------+--------+--------+ Brachial Dist 117       Multiphasic                 +--------------+----------+-----------+--------+--------+ Radial Prox   66        Multiphasic                 +--------------+----------+-----------+--------+--------+ Radial Mid    68        Multiphasic                 +--------------+----------+-----------+--------+--------+ Radial Dist   75        Multiphasic                 +--------------+----------+-----------+--------+--------+ Ulnar Prox    35        Multiphasic                 +--------------+----------+-----------+--------+--------+ Ulnar Mid     32        Multiphasic                 +--------------+----------+-----------+--------+--------+ Ulnar Dist    34        Multiphasic                 +--------------+----------+-----------+--------+--------+   Summary:  Right: No obstruction visualized in the right upper extremity. Left: No obstruction visualized in the left upper extremity. *See table(s) above for measurements and observations.    Preliminary      Pamella Pert, MD, PhD [facility]  Between 7 am - 7 pm I am available, please contact me via Amion (for emergencies) or Securechat (non urgent messages)  Between 7 pm - 7 am I am not available, please contact night coverage MD/APP via Amion

## 2024-02-26 DIAGNOSIS — I5021 Acute systolic (congestive) heart failure: Secondary | ICD-10-CM | POA: Diagnosis not present

## 2024-02-26 DIAGNOSIS — I480 Paroxysmal atrial fibrillation: Secondary | ICD-10-CM | POA: Diagnosis not present

## 2024-02-26 DIAGNOSIS — R601 Generalized edema: Secondary | ICD-10-CM | POA: Diagnosis not present

## 2024-02-26 LAB — COMPREHENSIVE METABOLIC PANEL
ALT: 32 U/L (ref 0–44)
AST: 43 U/L — ABNORMAL HIGH (ref 15–41)
Albumin: 1.7 g/dL — ABNORMAL LOW (ref 3.5–5.0)
Alkaline Phosphatase: 202 U/L — ABNORMAL HIGH (ref 38–126)
Anion gap: 8 (ref 5–15)
BUN: 34 mg/dL — ABNORMAL HIGH (ref 8–23)
CO2: 28 mmol/L (ref 22–32)
Calcium: 8.1 mg/dL — ABNORMAL LOW (ref 8.9–10.3)
Chloride: 91 mmol/L — ABNORMAL LOW (ref 98–111)
Creatinine, Ser: 0.52 mg/dL (ref 0.44–1.00)
GFR, Estimated: >60 mL/min (ref >60)
Glucose, Bld: 113 mg/dL — ABNORMAL HIGH (ref 70–99)
Potassium: 4.4 mmol/L (ref 3.5–5.1)
Sodium: 127 mmol/L — ABNORMAL LOW (ref 135–145)
Total Bilirubin: 0.7 mg/dL (ref 0.0–1.2)
Total Protein: 4.5 g/dL — ABNORMAL LOW (ref 6.5–8.1)

## 2024-02-26 LAB — CBC
HCT: 33 % — ABNORMAL LOW (ref 36.0–46.0)
Hemoglobin: 10.9 g/dL — ABNORMAL LOW (ref 12.0–15.0)
MCH: 29.2 pg (ref 26.0–34.0)
MCHC: 33 g/dL (ref 30.0–36.0)
MCV: 88.5 fL (ref 80.0–100.0)
Platelets: 197 10*3/uL (ref 150–400)
RBC: 3.73 MIL/uL — ABNORMAL LOW (ref 3.87–5.11)
RDW: 22.6 % — ABNORMAL HIGH (ref 11.5–15.5)
WBC: 6.6 10*3/uL (ref 4.0–10.5)
nRBC: 0 % (ref 0.0–0.2)

## 2024-02-26 LAB — URINALYSIS, COMPLETE (UACMP) WITH MICROSCOPIC
Bilirubin Urine: NEGATIVE
Glucose, UA: NEGATIVE mg/dL
Hgb urine dipstick: NEGATIVE
Ketones, ur: NEGATIVE mg/dL
Nitrite: NEGATIVE
Protein, ur: NEGATIVE mg/dL
Specific Gravity, Urine: 1.016 (ref 1.005–1.030)
WBC, UA: >50 WBC/hpf (ref 0–5)
pH: 5 (ref 5.0–8.0)

## 2024-02-26 LAB — PHOSPHORUS: Phosphorus: 2.9 mg/dL (ref 2.5–4.6)

## 2024-02-26 LAB — MAGNESIUM: Magnesium: 1.7 mg/dL (ref 1.7–2.4)

## 2024-02-26 MED ORDER — ALBUMIN HUMAN 25 % IV SOLN
25.0000 g | Freq: Four times a day (QID) | INTRAVENOUS | Status: AC
  Administered 2024-02-26 (×2): 25 g via INTRAVENOUS
  Filled 2024-02-26 (×2): qty 100

## 2024-02-26 NOTE — TOC Progression Note (Signed)
 Transition of Care [facility]) - Progression Note   Patient Details  Name: Wendy [name] MRN: [id number] Date of Birth: October 08, 1934  Transition of [facility]) CM/SW Contact  Ewing Schlein, LCSW Phone Number: 02/26/2024, 2:42 PM  Clinical Narrative: PT/OT evaluations recommended HH. CSW met with patient, spouse Cyanne Delmar), daughter [name]), and son in law Traci Sermon) to discuss therapy recommendations. Family is agreeable to patient being set up with HHPT/OT and daughter requested [facility] CSW made [facility] referral to [place] with [facility], which was accepted. HH orders placed by hospitalist. CSW updated family.  Expected Discharge Plan: Home w Home Health Services Barriers to Discharge: Continued Medical Work up  Expected Discharge Plan and Services In-house Referral: Clinical Social Work Post Acute Care Choice: Home Health Living arrangements for the past 2 months: Single Family Home            DME Arranged: N/A DME Agency: NA HH Arranged: PT, OT HH Agency: [facility] ([facility]) Date HH Agency Contacted: 02/26/24 Time HH Agency Contacted: 1427 Representative spoke with at [facility] Agency: Adele Dan  Social Determinants of Health (SDOH) Interventions SDOH Screenings   Food Insecurity: No Food Insecurity (02/24/2024)  Housing: Low Risk  (02/24/2024)  Transportation Needs: No Transportation Needs (02/24/2024)  Utilities: Not At Risk (02/24/2024)  Depression (PHQ2-9): Low Risk  (12/16/2023)  Financial Resource Strain: Low Risk  (08/05/2023)  Tobacco Use: Low Risk  (02/24/2024)  Health Literacy: Adequate Health Literacy (08/05/2023)   Readmission Risk Interventions    02/24/2024    2:35 PM 11/21/2023   11:43 AM  Readmission Risk Prevention Plan  Transportation Screening Complete Complete  Home Care Screening  Complete  Medication Review (RN CM)  Complete  HRI or Home Care Consult Complete   Social Work Consult for Recovery Care Planning/Counseling  Complete   Palliative Care Screening Not Applicable   Medication Review Oceanographer) Complete

## 2024-02-26 NOTE — Progress Notes (Signed)
 Rounding Note    Patient Name: Wendy [name] Date of Encounter: 02/26/2024  [facility] Cardiologist: [name] [name], MD   Subjective   She is more alert this AM. Sitting up and ate breakfast. Family is by the bedside.  Inpatient Medications    Scheduled Meds:  aspirin  81 mg Oral Daily   carvedilol  3.125 mg Oral BID WC   Chlorhexidine Gluconate Cloth  6 each Topical Daily   cyanocobalamin  1,000 mcg Oral Daily   docusate sodium  100 mg Oral BID   enoxaparin (LOVENOX) injection  40 mg Subcutaneous Q24H   famotidine  40 mg Oral Daily   feeding supplement  237 mL Oral TID BM   ferrous sulfate  325 mg Oral Q breakfast   furosemide  40 mg Intravenous Daily   multivitamin  1 tablet Oral Daily   simvastatin  20 mg Oral Q supper   sodium chloride flush  10-40 mL Intracatheter Q12H   Continuous Infusions:  albumin human     PRN Meds: acetaminophen **OR** acetaminophen, ondansetron **OR** ondansetron (ZOFRAN) IV, oxyCODONE, polyethylene glycol, senna-docusate, sodium chloride flush, traMADol   Vital Signs    Vitals:   02/26/24 0458 02/26/24 0500 02/26/24 0649 02/26/24 0700  BP: (!) 162/95 (!) 154/62 (!) 114/56   Pulse: (!) 126  (!) 115   Resp: (!) 24  16 18   Temp: 99.1 F (37.3 C)  98.6 F (37 C)   TempSrc: Oral  Oral   SpO2: 100%  96%   Weight:  57.4 kg    Height:        Intake/Output Summary (Last 24 hours) at 02/26/2024 0956 Last data filed at 02/26/2024 0300 Gross per 24 hour  Intake 240 ml  Output 100 ml  Net 140 ml      02/26/2024    5:00 AM 02/24/2024    1:53 AM 02/23/2024    2:39 PM  Last 3 Weights  Weight (lbs) 126 lb 8.7 oz 125 lb 125 lb  Weight (kg) 57.4 kg 56.7 kg 56.7 kg      Telemetry    Afib rates in the 1 teens - Personally Reviewed  ECG    No new - Personally Reviewed  Physical Exam   Vitals:   02/26/24 0649 02/26/24 0700  BP: (!) 114/56   Pulse: (!) 115   Resp: 16 18  Temp: 98.6 F (37 C)   SpO2: 96%     GEN:  No acute distress.   Neck: No JVD Cardiac: IRRR Respiratory: Clear to auscultation bilaterally. GI: Soft, nontender, non-distended  MS: anasarca Neuro:  Nonfocal  Psych: Normal affect   Labs    High Sensitivity Troponin:  No results for input(s): "TROPONINIHS" in the last 720 hours.   Chemistry Recent Labs  Lab 02/23/24 1345 02/24/24 0309 02/25/24 0318 02/26/24 0440  NA 126* 128* 131* 127*  K 4.1 3.2* 4.0 4.4  CL 94* 96* 95* 91*  CO2 26 25 28 28   GLUCOSE 165* 145* 147* 113*  BUN 25* 28* 29* 34*  CREATININE 0.36* 0.40* 0.37* 0.52  CALCIUM 8.3* 7.9* 8.2* 8.1*  MG 1.8 1.9  --  1.7  PROT 4.7* 4.0* 4.2* 4.5*  ALBUMIN 2.5* 1.7* 1.7* 1.7*  AST 52* 45* 42* 43*  ALT 33 34 31 32  ALKPHOS 217* 189* 180* 202*  BILITOT 0.5 0.4 0.5 0.7  GFRNONAA >60 >60 >60 >60  ANIONGAP 6 7 8 8     Lipids No results  for input(s): "CHOL", "TRIG", "HDL", "LABVLDL", "LDLCALC", "CHOLHDL" in the last 168 hours.  Hematology Recent Labs  Lab 02/24/24 0309 02/25/24 0318 02/26/24 0440  WBC 5.9 5.7 6.6  RBC 3.62* 3.54* 3.73*  HGB 10.4* 10.1* 10.9*  HCT 31.4* 31.1* 33.0*  MCV 86.7 87.9 88.5  MCH 28.7 28.5 29.2  MCHC 33.1 32.5 33.0  RDW 21.9* 22.5* 22.6*  PLT 167 184 197   Thyroid No results for input(s): "TSH", "FREET4" in the last 168 hours.  BNP Recent Labs  Lab 02/23/24 1345  BNP 614.7*    DDimer No results for input(s): "DDIMER" in the last 168 hours.   Radiology    US Abdomen Limited RUQ (LIVER/GB) Result Date: 02/26/2024 CLINICAL DATA:  Right upper quadrant pain EXAM: ULTRASOUND ABDOMEN LIMITED RIGHT UPPER QUADRANT COMPARISON:  02/16/2024 FINDINGS: Gallbladder: Prior cholecystectomy Common bile duct: Diameter: Normal caliber, 2 mm Liver: Increased echotexture compatible with fatty infiltration. No focal abnormality or biliary ductal dilatation. Portal vein is patent on color Doppler imaging with normal direction of blood flow towards the liver. Other: None. IMPRESSION: Hepatic  steatosis. Cholecystectomy. Electronically Signed   By: Charlett Nose M.D.   On: 02/26/2024 00:55   VAS [place] UPPER EXTREMITY ARTERIAL DUPLEX Result Date: 02/25/2024  UPPER EXTREMITY DUPLEX STUDY Patient Name:  Wendy [name]  Date of Exam:   02/24/2024 Medical Rec #: [id number]         Accession #:    [id number] Date of Birth: 1934/12/25         Patient Gender: F Patient Age:   89 years Exam Location:  [facility] Procedure:      VAS [place] UPPER EXTREMITY ARTERIAL DUPLEX Referring Phys: Joen Laura --------------------------------------------------------------------------------  Indications: Cyanosis.  Risk Factors: Hypertension, hyperlipidemia. Comparison Study: No prior studies. Performing Technologist: Chanda Busing RVT  Examination Guidelines: A complete evaluation includes B-mode imaging, spectral Doppler, color Doppler, and power Doppler as needed of all accessible portions of each vessel. Bilateral testing is considered an integral part of a complete examination. Limited examinations for reoccurring indications may be performed as noted.  Right Doppler Findings: +---------------+----------+-----------+--------+--------+ Site           PSV (cm/s)Waveform   StenosisComments +---------------+----------+-----------+--------+--------+ Subclavian Mid 58        triphasic                   +---------------+----------+-----------+--------+--------+ Subclavian Dist          Multiphasic                 +---------------+----------+-----------+--------+--------+ Axillary       93        triphasic                   +---------------+----------+-----------+--------+--------+ Brachial Prox  109       Multiphasic                 +---------------+----------+-----------+--------+--------+ Brachial Dist  105       Multiphasic                 +---------------+----------+-----------+--------+--------+ Radial Prox    78        Multiphasic                  +---------------+----------+-----------+--------+--------+ Radial Mid     75        Multiphasic                 +---------------+----------+-----------+--------+--------+ Radial Dist  81        Multiphasic                 +---------------+----------+-----------+--------+--------+ Ulnar Prox     60        Multiphasic                 +---------------+----------+-----------+--------+--------+ Ulnar Mid      39        Multiphasic                 +---------------+----------+-----------+--------+--------+ Ulnar Dist     32        Multiphasic                 +---------------+----------+-----------+--------+--------+   Left Doppler Findings: +--------------+----------+-----------+--------+--------+ Site          PSV (cm/s)Waveform   StenosisComments +--------------+----------+-----------+--------+--------+ Subclavian Mid92        Multiphasic                 +--------------+----------+-----------+--------+--------+ Axillary      82        Multiphasic                 +--------------+----------+-----------+--------+--------+ Brachial Prox 93        Multiphasic                 +--------------+----------+-----------+--------+--------+ Brachial Dist 117       Multiphasic                 +--------------+----------+-----------+--------+--------+ Radial Prox   66        Multiphasic                 +--------------+----------+-----------+--------+--------+ Radial Mid    68        Multiphasic                 +--------------+----------+-----------+--------+--------+ Radial Dist   75        Multiphasic                 +--------------+----------+-----------+--------+--------+ Ulnar Prox    35        Multiphasic                 +--------------+----------+-----------+--------+--------+ Ulnar Mid     32        Multiphasic                 +--------------+----------+-----------+--------+--------+ Ulnar Dist    34        Multiphasic                  +--------------+----------+-----------+--------+--------+   Summary:  Right: No obstruction visualized in the right upper extremity. Left: No obstruction visualized in the left upper extremity. *See table(s) above for measurements and observations. Electronically signed by Coral Else MD on 02/25/2024 at 4:44:39 PM.    Final    ECHOCARDIOGRAM COMPLETE Result Date: 02/24/2024    ECHOCARDIOGRAM REPORT   Patient Name:   Wendy [name] Date of Exam: 02/24/2024 Medical Rec #:  [id number]        Height:       66.0 in Accession #:    [id number]       Weight:       125.0 lb Date of Birth:  December 24, 1934        BSA:          1.638 m Patient Age:    89 years         BP:  112/53 mmHg Patient Gender: F                HR:           100 bpm. Exam Location:  Inpatient Procedure: 2D Echo, Cardiac Doppler and Color Doppler (Both Spectral and Color            Flow Doppler were utilized during procedure). Indications:    Dyspnea R06.00  History:        Patient has prior history of Echocardiogram examinations, most                 recent 10/14/2019. Arrythmias:Atrial Fibrillation; Risk                 Factors:Hypertension and Dyslipidemia.  Sonographer:    Araceli Bouche Referring Phys: [id number] PROSPER M AMPONSAH IMPRESSIONS  1. Left ventricular ejection fraction, by estimation, is 40 to 45%. The left ventricle has mildly decreased function. The left ventricle demonstrates regional wall motion abnormalities (see scoring diagram/findings for description). Left ventricular diastolic parameters are consistent with Grade I diastolic dysfunction (impaired relaxation).  2. Right ventricular systolic function is mildly reduced. The right ventricular size is normal. There is normal pulmonary artery systolic pressure.  3. Left atrial size was mildly dilated.  4. Right atrial size was mildly dilated.  5. The mitral valve is normal in structure. Mild mitral valve regurgitation. No evidence of mitral stenosis.  6. The aortic valve is  tricuspid. Aortic valve regurgitation is moderate. No aortic stenosis is present.  7. The inferior vena cava is normal in size with greater than 50% respiratory variability, suggesting right atrial pressure of 3 mmHg. FINDINGS  Left Ventricle: Left ventricular ejection fraction, by estimation, is 40 to 45%. The left ventricle has mildly decreased function. The left ventricle demonstrates regional wall motion abnormalities. The left ventricular internal cavity size was normal in size. There is no left ventricular hypertrophy. Left ventricular diastolic parameters are consistent with Grade I diastolic dysfunction (impaired relaxation).  LV Wall Scoring: Anterior and anterolateral wall motion abnormalities. Right Ventricle: The right ventricular size is normal. No increase in right ventricular wall thickness. Right ventricular systolic function is mildly reduced. There is normal pulmonary artery systolic pressure. The tricuspid regurgitant velocity is 2.42 m/s, and with an assumed right atrial pressure of 3 mmHg, the estimated right ventricular systolic pressure is 26.4 mmHg. Left Atrium: Left atrial size was mildly dilated. Right Atrium: Right atrial size was mildly dilated. Pericardium: Trivial pericardial effusion is present. Mitral Valve: The mitral valve is normal in structure. Mild mitral valve regurgitation. No evidence of mitral valve stenosis. Tricuspid Valve: The tricuspid valve is normal in structure. Tricuspid valve regurgitation is mild . No evidence of tricuspid stenosis. Aortic Valve: The aortic valve is tricuspid. Aortic valve regurgitation is moderate. Aortic regurgitation PHT measures 334 msec. No aortic stenosis is present. Pulmonic Valve: The pulmonic valve was normal in structure. Pulmonic valve regurgitation is trivial. No evidence of pulmonic stenosis. Aorta: The aortic root is normal in size and structure. Venous: The inferior vena cava is normal in size with greater than 50% respiratory  variability, suggesting right atrial pressure of 3 mmHg. IAS/Shunts: No atrial level shunt detected by color flow Doppler. Additional Comments: 3D imaging was not performed.  LEFT VENTRICLE PLAX 2D LVIDd:         3.30 cm LVIDs:         2.80 cm LV PW:         1.20 cm  LV IVS:        1.20 cm LVOT diam:     1.70 cm LV SV:         39 LV SV Index:   24 LVOT Area:     2.27 cm  RIGHT VENTRICLE            IVC RV S prime:     8.81 cm/s  IVC diam: 1.40 cm TAPSE (M-mode): 1.7 cm LEFT ATRIUM           Index        RIGHT ATRIUM           Index LA diam:      2.80 cm 1.71 cm/m   RA Area:     18.40 cm LA Vol (A2C): 35.9 ml 21.92 ml/m  RA Volume:   43.60 ml  26.62 ml/m LA Vol (A4C): 45.9 ml 28.03 ml/m  AORTIC VALVE             PULMONIC VALVE LVOT Vmax:   97.90 cm/s  PV Vmax:       0.74 m/s LVOT Vmean:  60.300 cm/s PV Peak grad:  2.2 mmHg LVOT VTI:    0.170 m AI PHT:      334 msec  AORTA Ao Root diam: 3.00 cm Ao Asc diam:  3.00 cm TRICUSPID VALVE TR Peak grad:   23.4 mmHg TR Vmax:        242.00 cm/s  SHUNTS Systemic VTI:  0.17 m Systemic Diam: 1.70 cm Clearnce Hasten Electronically signed by Clearnce Hasten Signature Date/Time: 02/24/2024/10:04:42 AM    Final     Cardiac Studies  TTE 02/24/2024 1. Left ventricular ejection fraction, by estimation, is 40 to 45%. The  left ventricle has mildly decreased function. The left ventricle  demonstrates regional wall motion abnormalities (see scoring  diagram/findings for description). Left ventricular  diastolic parameters are consistent with Grade I diastolic dysfunction  (impaired relaxation).   2. Right ventricular systolic function is mildly reduced. The right  ventricular size is normal. There is normal pulmonary artery systolic  pressure.   3. Left atrial size was mildly dilated.   4. Right atrial size was mildly dilated.   5. The mitral valve is normal in structure. Mild mitral valve  regurgitation. No evidence of mitral stenosis.   6. The aortic valve is  tricuspid. Aortic valve regurgitation is moderate.  No aortic stenosis is present.   7. The inferior vena cava is normal in size with greater than 50%  respiratory variability, suggesting right atrial pressure of 3 mmHg.   Patient Profile     This is Gosdin is a frail 89 year old female with history of pancreatic cancer on palliative chemotherapy/radiation, paroxysmal atrial fibrillation whom cardiology was consulted for A-fib.   Assessment & Plan    Paroxysmal Afib Her rates are mildly elevated. She is asymptomatic. Can continue current beta-blocker therapy. With her overall frailty and no plans for anticoagulation; will not plan for cardioversion. No changes today, cont. Coreg 3.125 mg BID.  ?Stress Cardiomyopathy No signs of acute MI on ecg; no symptoms. Did have anterior WMA, but without signs of high risk NSTEMI or STEMI, no plans for an ischemic eval. at this time. She is overall very malnourished and has pancreatic cancer on palliative chemotherapy/radiation.  Started an aspirin. No plans for aggressive prevention with significant frailty -Giving mild IV diuresis. Likely just one more day or so with sodium being low. Getting albumin  Chronic Problems: Malnourishment Pancreatic CA -  can consider palliative care involvement   For questions or updates, please contact [facility] Please consult [email or web address] for contact info under        Signed, Maisie Fus, MD  02/26/2024, 9:56 AM

## 2024-02-26 NOTE — Progress Notes (Signed)
   02/26/24 0458  Assess: MEWS Score  Temp 99.1 F (37.3 C)  BP (!) 162/95  MAP (mmHg) 112  Pulse Rate (!) 126  Resp (!) 24  SpO2 100 %  O2 Device Room Air  Assess: MEWS Score  MEWS Temp 0  MEWS Systolic 0  MEWS Pulse 2  MEWS RR 1  MEWS LOC 0  MEWS Score 3  MEWS Score Color Yellow  Assess: if the MEWS score is Yellow or Red  Were vital signs accurate and taken at a resting state? Yes  Does the patient meet 2 or more of the SIRS criteria? Yes  Does the patient have a confirmed or suspected source of infection? No  MEWS guidelines implemented  Yes, yellow  Treat  MEWS Interventions Considered administering scheduled or prn medications/treatments as ordered  Take Vital Signs  Increase Vital Sign Frequency  Yellow: Q2hr x1, continue Q4hrs until patient remains green for 12hrs  Escalate  MEWS: Escalate Yellow: Discuss with charge nurse and consider notifying provider and/or RRT  Notify: Charge Nurse/RN  Name of Charge Nurse/RN Notified Engineer, building services  Provider Notification  Provider Name/Title Virgel Manifold NP  Date Provider Notified 02/26/24  Time Provider Notified [phone]  Method of Notification Page  Notification Reason Other (Comment);Change in status (yellow MEW)  Provider response No new orders  Date of Provider Response 02/26/24  Time of Provider Response 0532  Assess: SIRS CRITERIA  SIRS Temperature  0  SIRS Respirations  1  SIRS Pulse 1  SIRS WBC 0  SIRS Score Sum  2   PRN Tylenol administered

## 2024-02-26 NOTE — Progress Notes (Signed)
 PROGRESS NOTE  Wendy [name]:[id number] DOB: 1934/10/14 DOA: 02/23/2024 PCP: [name], MD   LOS: 3 days   Brief Narrative / Interim history: 89 year old female with pancreatic cancer status post surgery, palliative chemotherapy as well as radiation, history of paroxysmal A-fib not on anticoagulation, hyperlipidemia who comes into the hospital with generalized edema, weight gain.  Was found to have anasarca.  A 2D echo showed depressed EF with wall motion abnormalities, cardiology was consulted.  She was placed on diuretics  Subjective / 24h Interval events: Sitting in the chair, feeling a little bit stronger and appears more alert.  Daughter is at bedside  Assesement and Plan: Principal Problem:   Generalized edema Active Problems:   Hyponatremia   Cancer of head of pancreas (HCC)   Hyperlipidemia   Normocytic anemia   Paroxysmal atrial fibrillation (HCC)   Failure to thrive in adult   Protein-calorie malnutrition, severe  Principal problem Acute on chronic systolic CHF-anasarca -patient admitted to the hospital with fluid overload.  She was placed on IV Lasix, continue.  A 2D echo showed LVEF 40-45% with wall motion abnormalities.  Cardiology consulted, appreciate input.  Started on beta-blockers, initially on metoprolol however she felt extremely fatigued and I changed her to carvedilol -Continue aspirin -Due to significant hypoalbuminemia, will give albumin x 2 doses today to augment diuresis  Active problems Pancreatic cancer-diagnosed in July 2024, complicated by gastric outlet obstruction requiring duodenal stent and biliary stent.  Did go for Whipple, but this could not be completed due to involvement of the SMV, but did have partial surgery, cholecystectomy -Status post chemo and radiation therapy with some treatment response.  Oncology following  Severe protein calorie malnutrition, FTT-continue nutritional supplements, albumin today  Purplish discoloration of  fingertips-improved, normal flow per arterial duplex  PAF-not on anticoagulation, cardiology seeing patient as well.  Rate control for now  Hyperlipidemia-continue statin  Normocytic anemia-due to cancer, continue iron supplementation  Bacteriuria-urinalysis yesterday showed presence of bacteria, large leukocyte esterase, however she is denying any dysuria or any UTI type symptoms.  She is afebrile and has a normal white count.  Will closely watch, low threshold to start antibiotics should she develop symptoms or fevers  Scheduled Meds:  aspirin  81 mg Oral Daily   carvedilol  3.125 mg Oral BID WC   Chlorhexidine Gluconate Cloth  6 each Topical Daily   cyanocobalamin  1,000 mcg Oral Daily   docusate sodium  100 mg Oral BID   enoxaparin (LOVENOX) injection  40 mg Subcutaneous Q24H   famotidine  40 mg Oral Daily   feeding supplement  237 mL Oral TID BM   ferrous sulfate  325 mg Oral Q breakfast   furosemide  40 mg Intravenous Daily   multivitamin  1 tablet Oral Daily   simvastatin  20 mg Oral Q supper   sodium chloride flush  10-40 mL Intracatheter Q12H   Continuous Infusions:  albumin human     PRN Meds:.acetaminophen **OR** acetaminophen, ondansetron **OR** ondansetron (ZOFRAN) IV, oxyCODONE, polyethylene glycol, senna-docusate, sodium chloride flush, traMADol  Current Outpatient Medications  Medication Instructions   cyanocobalamin (VITAMIN B12) 1,000 mcg, Oral, Every morning   docusate sodium (COLACE) 100 mg, Oral, 2 times daily   famotidine (PEPCID) 40 mg, Oral, Daily before breakfast   ferrous sulfate 325 mg, Oral, Daily with breakfast   lidocaine-prilocaine (EMLA) cream APPLY 1 APPLICATION TOPICALLY AS NEEDED **APPLY TO PORT 1-2 HOURS PRIOR TO APPOINTMENT   methocarbamol (ROBAXIN) 500 mg, Oral, 3  times daily PRN   ondansetron (ZOFRAN) 8 mg, Oral, Every 8 hours PRN   oxyCODONE (OXY IR/ROXICODONE) 5-10 mg, Oral, Every 6 hours PRN   polyethylene glycol (MIRALAX / GLYCOLAX)  17 g, Oral, Daily PRN   prochlorperazine (COMPAZINE) 5-10 mg, Oral, Every 6 hours PRN   simvastatin (ZOCOR) 20 mg, Oral, Daily with supper, Take 1/2 pill.   sodium chloride 1 g, Oral, 2 times daily with meals   traMADol (ULTRAM) 50 mg, Oral, Every 6 hours PRN, Do not take with oxycodone    Diet Orders (From admission, onward)     Start     Ordered   02/24/24 1452  Diet Heart Room service appropriate? Yes; Fluid consistency: Thin  Diet effective now       Question Answer Comment  Room service appropriate? Yes   Fluid consistency: Thin      02/24/24 1451            DVT prophylaxis: Place TED hose Start: 02/23/24 2341 enoxaparin (LOVENOX) injection 40 mg Start: 02/23/24 2200   Lab Results  Component Value Date   PLT 197 02/26/2024      Code Status: Limited: Do not attempt resuscitation (DNR) -DNR-LIMITED -Do Not Intubate/DNI   Family Communication: Daughter at bedside  Status is: Inpatient Remains inpatient appropriate because: Severity of illness   Level of care: Telemetry  Consultants:  Oncology Cardiology  Objective: Vitals:   02/26/24 0500 02/26/24 0649 02/26/24 0700 02/26/24 1024  BP: (!) 154/62 (!) 114/56  (!) 96/52  Pulse:  (!) 115  97  Resp:  16 18 18   Temp:  98.6 F (37 C)  (!) 97.5 F (36.4 C)  TempSrc:  Oral  Oral  SpO2:  96%  99%  Weight: 57.4 kg     Height:        Intake/Output Summary (Last 24 hours) at 02/26/2024 1100 Last data filed at 02/26/2024 0300 Gross per 24 hour  Intake 240 ml  Output 100 ml  Net 140 ml   Wt Readings from Last 3 Encounters:  02/26/24 57.4 kg  02/23/24 56.7 kg  02/19/24 52.6 kg    Examination:  Constitutional: NAD Eyes: lids and conjunctivae normal, no scleral icterus ENMT: mmm Neck: normal, supple Respiratory: clear to auscultation bilaterally, no wheezing, no crackles.  Cardiovascular: Regular rate and rhythm, no murmurs / rubs / gallops.  Trace edema. Abdomen: soft, no distention, no tenderness.  Bowel sounds positive.    Data Reviewed: I have independently reviewed following labs and imaging studies   CBC Recent Labs  Lab 02/23/24 1345 02/24/24 0309 02/25/24 0318 02/26/24 0440  WBC 7.7 5.9 5.7 6.6  HGB 11.8* 10.4* 10.1* 10.9*  HCT 35.6* 31.4* 31.1* 33.0*  PLT 195 167 184 197  MCV 85.8 86.7 87.9 88.5  MCH 28.4 28.7 28.5 29.2  MCHC 33.1 33.1 32.5 33.0  RDW 21.9* 21.9* 22.5* 22.6*  LYMPHSABS 0.4*  --   --   --   MONOABS 0.4  --   --   --   EOSABS 0.0  --   --   --   BASOSABS 0.0  --   --   --     Recent Labs  Lab 02/19/24 1120 02/23/24 1345 02/24/24 0309 02/25/24 0318 02/26/24 0440  NA 125* 126* 128* 131* 127*  K 4.2 4.1 3.2* 4.0 4.4  CL 93* 94* 96* 95* 91*  CO2 25 26 25 28 28   GLUCOSE 143* 165* 145* 147* 113*  BUN 21 25*  28* 29* 34*  CREATININE 0.46 0.36* 0.40* 0.37* 0.52  CALCIUM 8.1* 8.3* 7.9* 8.2* 8.1*  AST 45* 52* 45* 42* 43*  ALT 28 33 34 31 32  ALKPHOS 222* 217* 189* 180* 202*  BILITOT 0.7 0.5 0.4 0.5 0.7  ALBUMIN 2.5* 2.5* 1.7* 1.7* 1.7*  MG 1.5* 1.8 1.9  --  1.7  BNP  --  614.7*  --   --   --     ------------------------------------------------------------------------------------------------------------------ No results for input(s): "CHOL", "HDL", "LDLCALC", "TRIG", "CHOLHDL", "LDLDIRECT" in the last 72 hours.  No results found for: "HGBA1C" ------------------------------------------------------------------------------------------------------------------ No results for input(s): "TSH", "T4TOTAL", "T3FREE", "THYROIDAB" in the last 72 hours.  Invalid input(s): "FREET3"  Cardiac Enzymes No results for input(s): "CKMB", "TROPONINI", "MYOGLOBIN" in the last 168 hours.  Invalid input(s): "CK" ------------------------------------------------------------------------------------------------------------------    Component Value Date/Time   BNP 614.7 (H) 02/23/2024 1345    CBG: No results for input(s): "GLUCAP" in the last 168 hours.  No  results found for this or any previous visit (from the past 240 hours).   Radiology Studies: US Abdomen Limited RUQ (LIVER/GB) Result Date: 02/26/2024 CLINICAL DATA:  Right upper quadrant pain EXAM: ULTRASOUND ABDOMEN LIMITED RIGHT UPPER QUADRANT COMPARISON:  02/16/2024 FINDINGS: Gallbladder: Prior cholecystectomy Common bile duct: Diameter: Normal caliber, 2 mm Liver: Increased echotexture compatible with fatty infiltration. No focal abnormality or biliary ductal dilatation. Portal vein is patent on color Doppler imaging with normal direction of blood flow towards the liver. Other: None. IMPRESSION: Hepatic steatosis. Cholecystectomy. Electronically Signed   By: Charlett Nose M.D.   On: 02/26/2024 00:55     Pamella Pert, MD, PhD [facility]  Between 7 am - 7 pm I am available, please contact me via Amion (for emergencies) or Securechat (non urgent messages)  Between 7 pm - 7 am I am not available, please contact night coverage MD/APP via Amion

## 2024-02-26 NOTE — Plan of Care (Addendum)
  Problem: Clinical Measurements: Goal: Diagnostic test results will improve Outcome: Progressing Goal: Respiratory complications will improve Outcome: Progressing Goal: Cardiovascular complication will be avoided Outcome: Progressing   Problem: Coping: Goal: Level of anxiety will decrease Outcome: Progressing   Problem: Elimination: Goal: Will not experience complications related to urinary retention Outcome: Progressing   Problem: Safety: Goal: Ability to remain free from injury will improve Outcome: Progressing

## 2024-02-26 NOTE — Progress Notes (Signed)
 IP PROGRESS NOTE  Subjective:   Wendy [name] reports right-sided abdominal pain last night.  Her daughter was at the bedside this morning.  Objective: Vital signs in last 24 hours: Blood pressure (!) 107/53, pulse 90, temperature 99.5 F (37.5 C), temperature source Oral, resp. rate (!) 22, height 5\' 6"  (1.676 m), weight 126 lb 8.7 oz (57.4 kg), SpO2 100%.  Intake/Output from previous day: 02/26 0701 - 02/27 0700 In: 480 [P.O.:480] Out: 100 [Urine:100]  Physical Exam:  HEENT: Mouth is dry Cardiac: Irregular Abdomen: Soft, no hepatosplenomegaly, no mass Extremities: Pitting edema at the lower leg and hands bilaterally-improved   Portacath/PICC-without erythema  Lab Results: Recent Labs    02/25/24 0318 02/26/24 0440  WBC 5.7 6.6  HGB 10.1* 10.9*  HCT 31.1* 33.0*  PLT 184 197    BMET Recent Labs    02/25/24 0318 02/26/24 0440  NA 131* 127*  K 4.0 4.4  CL 95* 91*  CO2 28 28  GLUCOSE 147* 113*  BUN 29* 34*  CREATININE 0.37* 0.52  CALCIUM 8.2* 8.1*    Lab Results  Component Value Date   CAN199 206 (H) 01/06/2024    Studies/Results: US Abdomen Limited RUQ (LIVER/GB) Result Date: 02/26/2024 CLINICAL DATA:  Right upper quadrant pain EXAM: ULTRASOUND ABDOMEN LIMITED RIGHT UPPER QUADRANT COMPARISON:  02/16/2024 FINDINGS: Gallbladder: Prior cholecystectomy Common bile duct: Diameter: Normal caliber, 2 mm Liver: Increased echotexture compatible with fatty infiltration. No focal abnormality or biliary ductal dilatation. Portal vein is patent on color Doppler imaging with normal direction of blood flow towards the liver. Other: None. IMPRESSION: Hepatic steatosis. Cholecystectomy. Electronically Signed   By: Charlett Nose M.D.   On: 02/26/2024 00:55    Medications: I have reviewed the patient's current medications.  Assessment/Plan:  Pancreas cancer, clinical stage IIa (T3 N0)-pancreas head mass on CT abdomen/pelvis 07/07/2023, biopsy of duodenal stricture  07/08/2023-poorly differentiated adenocarcinoma CT abdomen/pelvis 07/07/2023-5.3 cm pancreas head mass with probable invasion of the duodenum seen him and abutment of the SMV without invasion or encasement.  Fluid-filled and distended stomach and proximal duodenum, single 11 mm periportal lymph node EGD 07/08/2023-large food residue in the stomach, extrinsic deformity at the first portion of the duodenum-biopsied Elevated CA 19-9 CT chest 07/11/2023-scattered small pulmonary nodules-indeterminate Cycle 1 gemcitabine/Abraxane 08/05/2023 Cycle 2 gemcitabine/Abraxane 08/19/2023 Cycle 3 gemcitabine/Abraxane 09/02/2023 Cycle 4 gemcitabine/Abraxane 09/16/2023 Cycle 5 gemcitabine/Abraxane 09/30/2023 CTs 10/09/2023-increase size of pancreas mass, increased pancreatic duct caliber and new biliary dilation, bilateral lower lobe nodules are stable, hypodense left thyroid nodule Cycle 6 gemcitabine/Abraxane 10/13/2023 10/28/2023 chemotherapy held due to LFT abnormalities Biliary drain placed 11/04/2023; internal/external biliary drain 11/06/2023 Exploratory laparotomy, cholecystectomy, choledochojejunostomy 11/17/2023-bulky tumor of the pancreas head with involvement of the transverse mesocolon and probable involvement of the distal SMV-plan for pancreaticoduodenectomy aborted Case presented at GI tumor conference 12/03/2023-recommendation to proceed with capecitabine/radiation Referred to Dr. Mitzi Hansen 12/09/2023 Radiation/capecitabine 12/29/2023-02/05/2024 CTs 02/16/2024: Pancreas head mass is smaller, unchanged duodenal stent, no evidence of lymphadenopathy or metastatic disease   2.   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   Anemia 4.   Iron deficiency 5.   B12 deficiency 6.   Atrial fibrillation-maintained on Xarelto prior to hospital admission 7.  Family history of pancreas and uterine cancer 8.  Hyperlipidemia 9.  Genetics-negative 10.  Admission on  02/23/2024 with failure to thrive and volume overload 02/24/2024 echocardiogram: LVEF 40-45%, mildly dilated left and right atria, mild decrease in right ventricular systolic  function, moderate aortic valve regurgitation Treated with diuresis   Wendy [name] was admitted 02/23/2024 with failure to thrive and evidence of volume overload.  She has undergone diuresis with improvement in edema, but she continues to have profound generalized weakness and malnutrition.  The underlying pancreas cancer is likely contributing.  I will recommend hospice care if her activity level and oral intake do not improve. Recommendations: Physical therapy, out of bed as tolerated Diuresis per medicine and cardiology Nutrition consult Please call oncology as needed.  Outpatient follow-up will be scheduled at the Cancer center    LOS: 3 days   [name], MD   02/26/2024, 4:57 PM

## 2024-02-26 NOTE — Evaluation (Signed)
 Physical Therapy Evaluation Patient Details Name: Wendy [name] MRN: [id number] DOB: 06/13/1934 Today's Date: 02/26/2024  History of Present Illness  Wendy [name] is a 89 y.o. female directly admitted 02/23/24 by the cancer center for generalized weakness/fatigue and worsening generalized edema. Dx with acute CHF. PMH includes adenocarcinoma of the pancreas status post completion of radiation/capecitabine on 02/05/24, A-fib, GERD, HLD, HTN, vitamin D deficiency, anemia and chronic hyponatremia  Clinical Impression  Pt admitted with above diagnosis. Min assist for sit to stand, min A to stand for 90 seconds for pericare, and min A to pivot from bedside commode to recliner. Pt fatigued after transferring. Instructed family (spouse, daughter, and son in law) in seated BLE/UE strengthening exercises for pt to work on after she's rested. They are hopeful she can DC home where they are able to provide 24* assistance.   Pt currently with functional limitations due to the deficits listed below (see PT Problem List). Pt will benefit from acute skilled PT to increase their independence and safety with mobility to allow discharge.           If plan is discharge home, recommend the following: A little help with walking and/or transfers;A little help with bathing/dressing/bathroom;Assistance with cooking/housework;Assist for transportation;Help with stairs or ramp for entrance   Can travel by private vehicle        Equipment Recommendations None recommended by PT  Recommendations for Other Services       Functional Status Assessment       Precautions / Restrictions Precautions Precautions: Fall Recall of Precautions/Restrictions: Intact Precaution/Restrictions Comments: family denies falls in past 6 months Restrictions Weight Bearing Restrictions Per Provider Order: No      Mobility  Bed Mobility               General bed mobility comments: up on bedside commode     Transfers Overall transfer level: Needs assistance Equipment used: 1 person hand held assist Transfers: Sit to/from Stand, Bed to chair/wheelchair/BSC Sit to Stand: Min assist, From elevated surface   Step pivot transfers: Min assist, From elevated surface       General transfer comment: increased time and effort for all movement, pt stood with HHA of 1 for ~90 seconds for pericare following BM on bedside commode, SpO2 92% on room air ~1 minute after transfer    Ambulation/Gait               General Gait Details: NT-pt too fatigued after transfer.  Stairs            Wheelchair Mobility     Tilt Bed    Modified Rankin (Stroke Patients Only)       Balance Overall balance assessment: Needs assistance Sitting-balance support: No upper extremity supported, Feet supported Sitting balance-Leahy Scale: Good     Standing balance support: Single extremity supported, During functional activity Standing balance-Leahy Scale: Poor (progressing towards fair)                               Pertinent Vitals/Pain Pain Assessment Faces Pain Scale: No hurt    Home Living Family/patient expects to be discharged to:: Private residence Living Arrangements: Spouse/significant other;Children (daughter and son in law living with them due to [place]) Available Help at Discharge: Family;Available 24 hours/day Type of Home: House Home Access: Stairs to enter Entrance Stairs-Rails: Right Entrance Stairs-Number of Steps: 6   Home Layout: Two level;Able to live on  main level with bedroom/bathroom Home Equipment: Pharmacist, hospital (2 wheels);BSC/3in1;Cane - single point;Wheelchair - manual Additional Comments: Pt worked at [employer] as a Designer, television/film set and likes to [employer]    Prior Function Prior Level of Function : Needs assist             Mobility Comments: holds onto her son in law to walk household distances ADLs Comments: assist from family -  bathing/dressing largely set up level     Extremity/Trunk Assessment   Upper Extremity Assessment Upper Extremity Assessment: Defer to OT evaluation    Lower Extremity Assessment Lower Extremity Assessment: Overall WFL for tasks assessed    Cervical / Trunk Assessment Cervical / Trunk Assessment: Kyphotic Cervical / Trunk Exceptions: hx of back pain since chemo/radiation  Communication   Communication Communication: No apparent difficulties Factors Affecting Communication: Other (comment) (sometimes delayed response)    Cognition Arousal: Alert Behavior During Therapy: Flat affect   PT - Cognitive impairments: No apparent impairments                       PT - Cognition Comments: family provided hx, pt fatigued from toileting and clean up Following commands: Intact       Cueing Cueing Techniques: Verbal cues     General Comments      Exercises Other Exercises Other Exercises: pt too fatigued to perform exercises at present, demonstrated to family LAQs, seated marching, shoulder flexion and ankle pumps for pt to attempt later   Assessment/Plan    PT Assessment Patient needs continued PT services  PT Problem List Decreased activity tolerance;Decreased mobility;Decreased balance       PT Treatment Interventions Therapeutic activities;Gait training;Functional mobility training;Therapeutic exercise;Patient/family education    PT Goals (Current goals can be found in the Care Plan section)  Acute Rehab PT Goals Patient Stated Goal: to get strong enough to go home PT Goal Formulation: With patient/family Time For Goal Achievement: 03/11/24 Potential to Achieve Goals: Fair    Frequency Min 1X/week     Co-evaluation               AM-PAC PT "6 Clicks" Mobility  Outcome Measure Help needed turning from your back to your side while in a flat bed without using bedrails?: A Lot Help needed moving from lying on your back to sitting on the side of a  flat bed without using bedrails?: A Lot Help needed moving to and from a bed to a chair (including a wheelchair)?: A Little Help needed standing up from a chair using your arms (e.g., wheelchair or bedside chair)?: A Little Help needed to walk in hospital room?: A Lot Help needed climbing 3-5 steps with a railing? : Total 6 Click Score: 13    End of Session   Activity Tolerance: Patient limited by fatigue Patient left: in chair;with call bell/phone within reach;with chair alarm set;with family/visitor present Nurse Communication: Mobility status PT Visit Diagnosis: Difficulty in walking, not elsewhere classified (R26.2);Other abnormalities of gait and mobility (R26.89);Adult, failure to thrive (R62.7)    Time: 1010-1029 PT Time Calculation (min) (ACUTE ONLY): 19 min   Charges:   PT Evaluation $PT Eval Moderate Complexity: 1 Mod   PT General Charges $$ ACUTE PT VISIT: 1 Visit         Tamala Ser PT 02/26/2024  Acute Rehabilitation Services  Office [phone]

## 2024-02-26 NOTE — Progress Notes (Signed)
 Pt has only had 100 ml urine output during shift. Bladder scanned, holding 65 ml. Pt had been NPO from lunch until about 2100 waiting to have ABD [place]. Provider notified.

## 2024-02-27 ENCOUNTER — Inpatient Hospital Stay (HOSPITAL_COMMUNITY): Payer: Medicare Other

## 2024-02-27 DIAGNOSIS — R601 Generalized edema: Secondary | ICD-10-CM | POA: Diagnosis not present

## 2024-02-27 DIAGNOSIS — M7989 Other specified soft tissue disorders: Secondary | ICD-10-CM | POA: Diagnosis not present

## 2024-02-27 DIAGNOSIS — I5021 Acute systolic (congestive) heart failure: Secondary | ICD-10-CM | POA: Diagnosis not present

## 2024-02-27 DIAGNOSIS — I480 Paroxysmal atrial fibrillation: Secondary | ICD-10-CM | POA: Diagnosis not present

## 2024-02-27 LAB — CBC
HCT: 26.1 % — ABNORMAL LOW (ref 36.0–46.0)
Hemoglobin: 8.6 g/dL — ABNORMAL LOW (ref 12.0–15.0)
MCH: 29.4 pg (ref 26.0–34.0)
MCHC: 33 g/dL (ref 30.0–36.0)
MCV: 89.1 fL (ref 80.0–100.0)
Platelets: 156 10*3/uL (ref 150–400)
RBC: 2.93 MIL/uL — ABNORMAL LOW (ref 3.87–5.11)
RDW: 22.4 % — ABNORMAL HIGH (ref 11.5–15.5)
WBC: 4.8 10*3/uL (ref 4.0–10.5)
nRBC: 0 % (ref 0.0–0.2)

## 2024-02-27 LAB — COMPREHENSIVE METABOLIC PANEL
ALT: 23 U/L (ref 0–44)
AST: 34 U/L (ref 15–41)
Albumin: 2 g/dL — ABNORMAL LOW (ref 3.5–5.0)
Alkaline Phosphatase: 142 U/L — ABNORMAL HIGH (ref 38–126)
Anion gap: 6 (ref 5–15)
BUN: 29 mg/dL — ABNORMAL HIGH (ref 8–23)
CO2: 31 mmol/L (ref 22–32)
Calcium: 7.9 mg/dL — ABNORMAL LOW (ref 8.9–10.3)
Chloride: 92 mmol/L — ABNORMAL LOW (ref 98–111)
Creatinine, Ser: 0.45 mg/dL (ref 0.44–1.00)
GFR, Estimated: >60 mL/min (ref >60)
Glucose, Bld: 135 mg/dL — ABNORMAL HIGH (ref 70–99)
Potassium: 3.3 mmol/L — ABNORMAL LOW (ref 3.5–5.1)
Sodium: 129 mmol/L — ABNORMAL LOW (ref 135–145)
Total Bilirubin: 0.6 mg/dL (ref 0.0–1.2)
Total Protein: 4 g/dL — ABNORMAL LOW (ref 6.5–8.1)

## 2024-02-27 LAB — MAGNESIUM: Magnesium: 1.6 mg/dL — ABNORMAL LOW (ref 1.7–2.4)

## 2024-02-27 MED ORDER — POTASSIUM CHLORIDE CRYS ER 20 MEQ PO TBCR
40.0000 meq | EXTENDED_RELEASE_TABLET | Freq: Once | ORAL | Status: AC
  Administered 2024-02-27: 40 meq via ORAL
  Filled 2024-02-27: qty 2

## 2024-02-27 MED ORDER — ALBUMIN HUMAN 25 % IV SOLN
25.0000 g | Freq: Four times a day (QID) | INTRAVENOUS | Status: AC
  Administered 2024-02-27 (×2): 25 g via INTRAVENOUS
  Filled 2024-02-27 (×2): qty 100

## 2024-02-27 MED ORDER — SODIUM CHLORIDE 0.9 % IV SOLN
1.0000 g | INTRAVENOUS | Status: DC
  Administered 2024-02-27 – 2024-02-28 (×2): 1 g via INTRAVENOUS
  Filled 2024-02-27 (×3): qty 10

## 2024-02-27 MED ORDER — MAGNESIUM SULFATE 2 GM/50ML IV SOLN
2.0000 g | Freq: Once | INTRAVENOUS | Status: AC
  Administered 2024-02-27: 2 g via INTRAVENOUS
  Filled 2024-02-27: qty 50

## 2024-02-27 MED ORDER — FUROSEMIDE 40 MG PO TABS
40.0000 mg | ORAL_TABLET | Freq: Every day | ORAL | Status: DC
  Administered 2024-02-28 – 2024-02-29 (×2): 40 mg via ORAL
  Filled 2024-02-27 (×2): qty 1

## 2024-02-27 NOTE — Progress Notes (Signed)
 IP PROGRESS NOTE  Subjective:   Ms. Pottinger reports improvement in peripheral edema and pain.  She was able to eat last night.  Her daughter is at the bedside.  Objective: Vital signs in last 24 hours: Blood pressure (!) 96/46, pulse 100, temperature 97.8 F (36.6 C), temperature source Oral, resp. rate 20, height 5\' 6"  (1.676 m), weight 117 lb (53.1 kg), SpO2 100%.  Intake/Output from previous day: 02/27 0701 - 02/28 0700 In: 998.1 [P.O.:840; IV Piggyback:158.1] Out: 600 [Urine:600]  Physical Exam:  HEENT: No thrush  Abdomen: Soft, no hepatosplenomegaly, no mass Extremities: Pitting edema at the lower leg and and right greater than left hand, improved   Portacath/PICC-without erythema  Lab Results: Recent Labs    02/26/24 0440 02/27/24 0211  WBC 6.6 4.8  HGB 10.9* 8.6*  HCT 33.0* 26.1*  PLT 197 156    BMET Recent Labs    02/26/24 0440 02/27/24 0211  NA 127* 129*  K 4.4 3.3*  CL 91* 92*  CO2 28 31  GLUCOSE 113* 135*  BUN 34* 29*  CREATININE 0.52 0.45  CALCIUM 8.1* 7.9*    Lab Results  Component Value Date   CAN199 206 (H) 01/06/2024    Studies/Results: US Abdomen Limited RUQ (LIVER/GB) Result Date: 02/26/2024 CLINICAL DATA:  Right upper quadrant pain EXAM: ULTRASOUND ABDOMEN LIMITED RIGHT UPPER QUADRANT COMPARISON:  02/16/2024 FINDINGS: Gallbladder: Prior cholecystectomy Common bile duct: Diameter: Normal caliber, 2 mm Liver: Increased echotexture compatible with fatty infiltration. No focal abnormality or biliary ductal dilatation. Portal vein is patent on color Doppler imaging with normal direction of blood flow towards the liver. Other: None. IMPRESSION: Hepatic steatosis. Cholecystectomy. Electronically Signed   By: Charlett Nose M.D.   On: 02/26/2024 00:55    Medications: I have reviewed the patient's current medications.  Assessment/Plan:  Pancreas cancer, clinical stage IIa (T3 N0)-pancreas head mass on CT abdomen/pelvis 07/07/2023, biopsy of  duodenal stricture 07/08/2023-poorly differentiated adenocarcinoma CT abdomen/pelvis 07/07/2023-5.3 cm pancreas head mass with probable invasion of the duodenum seen him and abutment of the SMV without invasion or encasement.  Fluid-filled and distended stomach and proximal duodenum, single 11 mm periportal lymph node EGD 07/08/2023-large food residue in the stomach, extrinsic deformity at the first portion of the duodenum-biopsied Elevated CA 19-9 CT chest 07/11/2023-scattered small pulmonary nodules-indeterminate Cycle 1 gemcitabine/Abraxane 08/05/2023 Cycle 2 gemcitabine/Abraxane 08/19/2023 Cycle 3 gemcitabine/Abraxane 09/02/2023 Cycle 4 gemcitabine/Abraxane 09/16/2023 Cycle 5 gemcitabine/Abraxane 09/30/2023 CTs 10/09/2023-increase size of pancreas mass, increased pancreatic duct caliber and new biliary dilation, bilateral lower lobe nodules are stable, hypodense left thyroid nodule Cycle 6 gemcitabine/Abraxane 10/13/2023 10/28/2023 chemotherapy held due to LFT abnormalities Biliary drain placed 11/04/2023; internal/external biliary drain 11/06/2023 Exploratory laparotomy, cholecystectomy, choledochojejunostomy 11/17/2023-bulky tumor of the pancreas head with involvement of the transverse mesocolon and probable involvement of the distal SMV-plan for pancreaticoduodenectomy aborted Case presented at GI tumor conference 12/03/2023-recommendation to proceed with capecitabine/radiation Referred to Dr. Mitzi Hansen 12/09/2023 Radiation/capecitabine 12/29/2023-02/05/2024 CTs 02/16/2024: Pancreas head mass is smaller, unchanged duodenal stent, no evidence of lymphadenopathy or metastatic disease   2.   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   Anemia 4.   Iron deficiency 5.   B12 deficiency 6.   Atrial fibrillation-maintained on Xarelto prior to hospital admission 7.  Family history of pancreas and uterine cancer 8.  Hyperlipidemia 9.   Genetics-negative 10.  Admission on 02/23/2024 with failure to thrive and volume overload 02/24/2024 echocardiogram: LVEF 40-45%, mildly dilated left  and right atria, mild decrease in right ventricular systolic function, moderate aortic valve regurgitation Treated with diuresis   Ms. Amundson was admitted 02/23/2024 with failure to thrive and evidence of volume overload.  She has undergone diuresis with improvement in edema.  Her performance status has improved.  The underlying pancreas cancer is likely contributing to the malnutrition and low activity level.  She will most likely be able to return home over the next few days.   Recommendations: Physical therapy, out of bed as tolerated Continue diuresis per medicine and cardiology Follow hemoglobin Please call oncology over the weekend as needed, I will check on her 03/01/2024 if she remains in the hospital Outpatient follow-up will be scheduled at the [facility]    LOS: 4 days   [name], MD   02/27/2024, 3:55 PM

## 2024-02-27 NOTE — Progress Notes (Signed)
 Physical Therapy Treatment Patient Details Name: Wendy [name] MRN: [id number] DOB: 1934/05/30 Today's Date: 02/27/2024   History of Present Illness BROOKLEN RUNQUIST is a 89 y.o. female directly admitted 02/23/24 by the cancer center for generalized weakness/fatigue and worsening generalized edema. Dx with acute CHF. PMH includes adenocarcinoma of the pancreas status post completion of radiation/capecitabine on 02/05/24, A-fib, GERD, HLD, HTN, vitamin D deficiency, anemia and chronic hyponatremia    PT Comments  Pt ambulated 28' holding onto son in law's BUEs (he was walking backwards, this is how they walk at home), HR 121 max, HR 96 at rest, distance limited by fatigue, no loss of balance. Reviewed seated BUE/LE exercises to be done independently after pt has rested and recovered from ambulation. Pt has excellent family support (daughter, son in law, spouse present). Noted edema RUE and RLE, RN notified). HHPT recommended.     If plan is discharge home, recommend the following: A little help with walking and/or transfers;A little help with bathing/dressing/bathroom;Assistance with cooking/housework;Assist for transportation;Help with stairs or ramp for entrance   Can travel by private vehicle        Equipment Recommendations  None recommended by PT    Recommendations for Other Services       Precautions / Restrictions Precautions Precautions: Fall Recall of Precautions/Restrictions: Intact Precaution/Restrictions Comments: family denies falls in past 6 months Restrictions Weight Bearing Restrictions Per Provider Order: No     Mobility  Bed Mobility               General bed mobility comments: up in recliner    Transfers Overall transfer level: Needs assistance Equipment used: 1 person hand held assist Transfers: Sit to/from Stand Sit to Stand: Min assist, From elevated surface, Mod assist           General transfer comment: increased time and effort for all  movement, min/mod A to power up (son in law assisted pt with B HHA)    Ambulation/Gait Ambulation/Gait assistance: Min assist Gait Distance (Feet): 65 Feet Assistive device: 1 person hand held assist Gait Pattern/deviations: Step-through pattern, Decreased stride length Gait velocity: decr     General Gait Details: pt held onto son inlaw's BUE (he was walking backwards, this is how they walk at home), HR 121 max, HR 96 at rest, no loss of balance, distance limited by fatigue, 2 standing rest breaks   Stairs             Wheelchair Mobility     Tilt Bed    Modified Rankin (Stroke Patients Only)       Balance Overall balance assessment: Needs assistance Sitting-balance support: No upper extremity supported, Feet supported Sitting balance-Leahy Scale: Good     Standing balance support: Single extremity supported, During functional activity Standing balance-Leahy Scale: Poor (progressing towards fair)                              Communication Communication Communication: No apparent difficulties Factors Affecting Communication: Other (comment) (sometimes delayed response)  Cognition Arousal: Alert Behavior During Therapy: Flat affect   PT - Cognitive impairments: No apparent impairments                       PT - Cognition Comments: family provided hx, pt fatigued from toileting and clean up Following commands: Intact      Cueing Cueing Techniques: Verbal cues  Exercises  General Comments        Pertinent Vitals/Pain Pain Assessment Faces Pain Scale: Hurts little more Pain Location: right low back Pain Descriptors / Indicators: Discomfort, Sore Pain Intervention(s): Limited activity within patient's tolerance, Monitored during session, Repositioned    Home Living                          Prior Function            PT Goals (current goals can now be found in the care plan section) Acute Rehab PT Goals Patient  Stated Goal: to get strong enough to go home PT Goal Formulation: With patient/family Time For Goal Achievement: 03/11/24 Potential to Achieve Goals: Fair Progress towards PT goals: Progressing toward goals    Frequency    Min 1X/week      PT Plan      Co-evaluation              AM-PAC PT "6 Clicks" Mobility   Outcome Measure  Help needed turning from your back to your side while in a flat bed without using bedrails?: A Lot Help needed moving from lying on your back to sitting on the side of a flat bed without using bedrails?: A Lot Help needed moving to and from a bed to a chair (including a wheelchair)?: A Little Help needed standing up from a chair using your arms (e.g., wheelchair or bedside chair)?: A Little Help needed to walk in hospital room?: A Lot Help needed climbing 3-5 steps with a railing? : Total 6 Click Score: 13    End of Session Equipment Utilized During Treatment: Gait belt Activity Tolerance: Patient limited by fatigue Patient left: in chair;with call bell/phone within reach;with chair alarm set;with family/visitor present Nurse Communication: Mobility status PT Visit Diagnosis: Difficulty in walking, not elsewhere classified (R26.2);Other abnormalities of gait and mobility (R26.89);Adult, failure to thrive (R62.7)     Time: 1013-1033 PT Time Calculation (min) (ACUTE ONLY): 20 min  Charges:    $Gait Training: 8-22 mins PT General Charges $$ ACUTE PT VISIT: 1 Visit                     Tamala Ser PT 02/27/2024  Acute Rehabilitation Services  Office [phone]

## 2024-02-27 NOTE — Progress Notes (Addendum)
 PROGRESS NOTE  Wendy [name]:[id number] DOB: [birth year] DOA: 02/23/2024 PCP: [name], MD   LOS: 4 days   Brief Narrative / Interim history: 89 year old female with pancreatic cancer status post surgery, palliative chemotherapy as well as radiation, history of paroxysmal A-fib not on anticoagulation, hyperlipidemia who comes into the hospital with generalized edema, weight gain.  Was found to have anasarca.  A 2D echo showed depressed EF with wall motion abnormalities, cardiology was consulted.  She was placed on diuretics  Subjective / 24h Interval events: Sitting in the chair, complains of right hand swelling, no chest discomfort, no palpitations, no nausea or vomiting.  Assesement and Plan: Principal Problem:   Generalized edema Active Problems:   Hyponatremia   Cancer of head of pancreas (HCC)   Hyperlipidemia   Normocytic anemia   Paroxysmal atrial fibrillation (HCC)   Failure to thrive in adult   Protein-calorie malnutrition, severe  Principal problem Acute on chronic systolic CHF-anasarca -patient admitted to the hospital with fluid overload.  She was placed on IV Lasix, cardiology was consulted.  A 2D echo showed LVEF 40-45% with wall motion abnormalities.  Cardiology consulted, appreciate input, she will be managed medically.  Started on beta-blockers, initially on metoprolol however she felt extremely fatigued and I changed her to carvedilol -Continue aspirin -Due to significant hypoalbuminemia, will give albumin again today, 2 additional doses  Active problems Pancreatic cancer-diagnosed in July 2024, complicated by gastric outlet obstruction requiring duodenal stent and biliary stent.  Did go for Whipple, but this could not be completed due to involvement of the SMV, but did have partial surgery, cholecystectomy -Status post chemo and radiation therapy with some treatment response.  Oncology following  Hyponatremia-likely in the setting of fluid overload,  sodium overall stable, continue Lasix, will receive albumin today  Severe protein calorie malnutrition, FTT-continue nutritional supplements, repeat albumin today  Purplish discoloration of fingertips-improved, normal flow per arterial duplex  PAF-not on anticoagulation, cardiology seeing patient as well.  Rate control for now, continue Coreg  Hyperlipidemia-continue statin  Normocytic anemia-due to cancer, continue iron supplementation  Bacteriuria-urinalysis showed presence of bacteria, large leukocyte esterase, however she is denying any dysuria or any UTI type symptoms.  She is afebrile and has a normal white count.  Will closely watch, low threshold to start antibiotics should she develop symptoms or fevers.  Remains asymptomatic today  Scheduled Meds:  aspirin  81 mg Oral Daily   carvedilol  3.125 mg Oral BID WC   Chlorhexidine Gluconate Cloth  6 each Topical Daily   cyanocobalamin  1,000 mcg Oral Daily   docusate sodium  100 mg Oral BID   enoxaparin (LOVENOX) injection  40 mg Subcutaneous Q24H   famotidine  40 mg Oral Daily   feeding supplement  237 mL Oral TID BM   ferrous sulfate  325 mg Oral Q breakfast   furosemide  40 mg Oral Daily   multivitamin  1 tablet Oral Daily   simvastatin  20 mg Oral Q supper   sodium chloride flush  10-40 mL Intracatheter Q12H   Continuous Infusions:  albumin human 25 g (02/27/24 1047)   PRN Meds:.acetaminophen **OR** acetaminophen, ondansetron **OR** ondansetron (ZOFRAN) IV, oxyCODONE, polyethylene glycol, senna-docusate, sodium chloride flush, traMADol  Current Outpatient Medications  Medication Instructions   cyanocobalamin (VITAMIN B12) 1,000 mcg, Oral, Every morning   docusate sodium (COLACE) 100 mg, Oral, 2 times daily   famotidine (PEPCID) 40 mg, Oral, Daily before breakfast   ferrous sulfate 325 mg, Oral,  Daily with breakfast   lidocaine-prilocaine (EMLA) cream APPLY 1 APPLICATION TOPICALLY AS NEEDED **APPLY TO PORT 1-2 HOURS  PRIOR TO APPOINTMENT   methocarbamol (ROBAXIN) 500 mg, Oral, 3 times daily PRN   ondansetron (ZOFRAN) 8 mg, Oral, Every 8 hours PRN   oxyCODONE (OXY IR/ROXICODONE) 5-10 mg, Oral, Every 6 hours PRN   polyethylene glycol (MIRALAX / GLYCOLAX) 17 g, Oral, Daily PRN   prochlorperazine (COMPAZINE) 5-10 mg, Oral, Every 6 hours PRN   simvastatin (ZOCOR) 20 mg, Oral, Daily with supper, Take 1/2 pill.   sodium chloride 1 g, Oral, 2 times daily with meals   traMADol (ULTRAM) 50 mg, Oral, Every 6 hours PRN, Do not take with oxycodone    Diet Orders (From admission, onward)     Start     Ordered   02/24/24 1452  Diet Heart Room service appropriate? Yes; Fluid consistency: Thin  Diet effective now       Question Answer Comment  Room service appropriate? Yes   Fluid consistency: Thin      02/24/24 1451            DVT prophylaxis: Place TED hose Start: 02/23/24 2341 enoxaparin (LOVENOX) injection 40 mg Start: 02/23/24 2200   Lab Results  Component Value Date   PLT 156 02/27/2024      Code Status: Limited: Do not attempt resuscitation (DNR) -DNR-LIMITED -Do Not Intubate/DNI   Family Communication: Daughter at bedside  Status is: Inpatient Remains inpatient appropriate because: Severity of illness   Level of care: Telemetry  Consultants:  Oncology Cardiology  Objective: Vitals:   02/26/24 1430 02/26/24 2017 02/27/24 0534 02/27/24 1000  BP: (!) 107/53 125/70 (!) 109/50   Pulse: 90 100 89   Resp: (!) 22 14 14    Temp: 99.5 F (37.5 C) (!) 97.5 F (36.4 C) 98.7 F (37.1 C)   TempSrc: Oral Oral Oral   SpO2: 100% 100% 97%   Weight:    53.1 kg  Height:        Intake/Output Summary (Last 24 hours) at 02/27/2024 1119 Last data filed at 02/27/2024 0534 Gross per 24 hour  Intake 998.08 ml  Output 600 ml  Net 398.08 ml   Wt Readings from Last 3 Encounters:  02/27/24 53.1 kg  02/23/24 56.7 kg  02/19/24 52.6 kg    Examination:  Constitutional: NAD Eyes: lids and  conjunctivae normal, no scleral icterus ENMT: mmm Neck: normal, supple Respiratory: clear to auscultation bilaterally, no wheezing, no crackles.  Cardiovascular: Regular rate and rhythm, no murmurs / rubs / gallops. No LE edema. Abdomen: soft, no distention, no tenderness. Bowel sounds positive.    Data Reviewed: I have independently reviewed following labs and imaging studies   CBC Recent Labs  Lab 02/23/24 1345 02/24/24 0309 02/25/24 0318 02/26/24 0440 02/27/24 0211  WBC 7.7 5.9 5.7 6.6 4.8  HGB 11.8* 10.4* 10.1* 10.9* 8.6*  HCT 35.6* 31.4* 31.1* 33.0* 26.1*  PLT 195 167 184 197 156  MCV 85.8 86.7 87.9 88.5 89.1  MCH 28.4 28.7 28.5 29.2 29.4  MCHC 33.1 33.1 32.5 33.0 33.0  RDW 21.9* 21.9* 22.5* 22.6* 22.4*  LYMPHSABS 0.4*  --   --   --   --   MONOABS 0.4  --   --   --   --   EOSABS 0.0  --   --   --   --   BASOSABS 0.0  --   --   --   --  Recent Labs  Lab 02/23/24 1345 02/24/24 0309 02/25/24 0318 02/26/24 0440 02/27/24 0211  NA 126* 128* 131* 127* 129*  K 4.1 3.2* 4.0 4.4 3.3*  CL 94* 96* 95* 91* 92*  CO2 26 25 28 28 31   GLUCOSE 165* 145* 147* 113* 135*  BUN 25* 28* 29* 34* 29*  CREATININE 0.36* 0.40* 0.37* 0.52 0.45  CALCIUM 8.3* 7.9* 8.2* 8.1* 7.9*  AST 52* 45* 42* 43* 34  ALT 33 34 31 32 23  ALKPHOS 217* 189* 180* 202* 142*  BILITOT 0.5 0.4 0.5 0.7 0.6  ALBUMIN 2.5* 1.7* 1.7* 1.7* 2.0*  MG 1.8 1.9  --  1.7 1.6*  BNP 614.7*  --   --   --   --     ------------------------------------------------------------------------------------------------------------------ No results for input(s): "CHOL", "HDL", "LDLCALC", "TRIG", "CHOLHDL", "LDLDIRECT" in the last 72 hours.  No results found for: "HGBA1C" ------------------------------------------------------------------------------------------------------------------ No results for input(s): "TSH", "T4TOTAL", "T3FREE", "THYROIDAB" in the last 72 hours.  Invalid input(s): "FREET3"  Cardiac Enzymes No  results for input(s): "CKMB", "TROPONINI", "MYOGLOBIN" in the last 168 hours.  Invalid input(s): "CK" ------------------------------------------------------------------------------------------------------------------    Component Value Date/Time   BNP 614.7 (H) 02/23/2024 1345    CBG: No results for input(s): "GLUCAP" in the last 168 hours.  No results found for this or any previous visit (from the past 240 hours).   Radiology Studies: No results found.    Pamella Pert, MD, PhD [facility]  Between 7 am - 7 pm I am available, please contact me via Amion (for emergencies) or Securechat (non urgent messages)  Between 7 pm - 7 am I am not available, please contact night coverage MD/APP via Amion

## 2024-02-27 NOTE — Plan of Care (Signed)

## 2024-02-27 NOTE — Progress Notes (Signed)
 Heart Failure Navigator Progress Note  Assessed for Heart & Vascular TOC clinic readiness.  Patient with EF 40-45%, with pancreatic cancer, palliative chemotherapy and radiation, weakness and failure to thrive. Cardiology consulted for A-fib, plan to mange conservatively with no ischemic evaluation planned. No HF TOC .   Navigator will sign off at this time.    Rhae Hammock, BSN, Scientist, clinical (histocompatibility and immunogenetics) Only

## 2024-02-27 NOTE — Consult Note (Signed)
 Patient Name: Wendy [name] Date of Encounter: 02/27/2024 [facility] Cardiologist: [name] [name], MD   Interval Summary  .    Was sitting up in the chair during the visit. Reported some worsening swelling on the right arm. Family present during the visit and helped answer questions.  Vital Signs .    Vitals:   02/26/24 1024 02/26/24 1430 02/26/24 2017 02/27/24 0534  BP: (!) 96/52 (!) 107/53 125/70 (!) 109/50  Pulse: 97 90 100 89  Resp: 18 (!) 22 14 14   Temp: (!) 97.5 F (36.4 C) 99.5 F (37.5 C) (!) 97.5 F (36.4 C) 98.7 F (37.1 C)  TempSrc: Oral Oral Oral Oral  SpO2: 99% 100% 100% 97%  Weight:      Height:        Intake/Output Summary (Last 24 hours) at 02/27/2024 0910 Last data filed at 02/27/2024 0534 Gross per 24 hour  Intake 998.08 ml  Output 600 ml  Net 398.08 ml      02/26/2024    5:00 AM 02/24/2024    1:53 AM 02/23/2024    2:39 PM  Last 3 Weights  Weight (lbs) 126 lb 8.7 oz 125 lb 125 lb  Weight (kg) 57.4 kg 56.7 kg 56.7 kg      Telemetry/ECG    Atrial fibrillation with rates mostly in high 90's/ low 100's - Personally Reviewed  Physical Exam .   GEN: No acute distress.  Frail and appearing malnourished. Neck: No JVD Cardiac: Irregularly irregular rhythm, diastolic murmur Respiratory: Bibasilar crackles.  MS: 2+ lower extremity pitting edema. Right arm and hand slightly more swollen than the left.   Assessment & Plan .     This is Wendy [name] is a frail 89 year old female with history of pancreatic cancer on palliative chemotherapy/radiation, paroxysmal atrial fibrillation whom cardiology was consulted for A-fib   Paroxysmal Afib Her rates on telemetry continue to be slightly elevated. They are mostly in the high 90's and low 100's. Blood pressures appear to have dropped slightly with the most recent one being 109/50. Is on palliative care for pancreatic cancer and not planning to start blood thinner.  cont. Coreg 3.125 mg BID.     Possible Stress Cardiomyopathy No chest pain or EKG findings suggest concerning ACS. Echo showed anterior wall motion abnormalities. With current palliative care status was started on aspirin 81 mg. Will manage conservatively with no ischemic evaluation planned.  - stop IV diuresis, is continuing to get albumin - start 40 mg oral lasix daily    Chronic Problems: Manage per primary-  Malnourishment, Pancreatic CA   For questions or updates, please contact [facility] Please consult [email or web address] for contact info under        Signed, Arabella Merles, PA-C   Agree with assessment of APP/PA Arabella Merles. I have reviewed all pertinent hemodynamic, laboratory, and cardiac studies.  Wendy [name] is an 89 year old female with history of pancreatic cancer on palliative chemo.  She has significant malnutrition and anasarca.  She also had systolic heart failure and an anterior wall motion abnormality.  She did not have any prior chest pressure, ECG changes, or history of coronary disease.  Could be stress cardiomyopathy.  We have been IV diuresing her.  She is has done well with this and will now plan to transition her to oral Lasix.  Her rates are better controlled . otherwise there is nothing else to add from a cardiology perspective.  Cardiology will sign off.  Vitals:  02/26/24 2017 02/27/24 0534  BP: 125/70 (!) 109/50  Pulse: 100 89  Resp: 14 14  Temp: (!) 97.5 F (36.4 C) 98.7 F (37.1 C)  SpO2: 100% 97%   Physical Exam Gen: frail, cachectic Neuro: alert and oriented CV: IRRR, no murmurs.  Pulm: nl wob,CLAB Abd: non distended Ext: No LE edema Skin: warm and well perfused Psych: normal mood

## 2024-02-28 ENCOUNTER — Inpatient Hospital Stay (HOSPITAL_COMMUNITY)

## 2024-02-28 DIAGNOSIS — R601 Generalized edema: Secondary | ICD-10-CM | POA: Diagnosis not present

## 2024-02-28 LAB — CBC
HCT: 22.2 % — ABNORMAL LOW (ref 36.0–46.0)
Hemoglobin: 7 g/dL — ABNORMAL LOW (ref 12.0–15.0)
MCH: 29.3 pg (ref 26.0–34.0)
MCHC: 31.5 g/dL (ref 30.0–36.0)
MCV: 92.9 fL (ref 80.0–100.0)
Platelets: 170 10*3/uL (ref 150–400)
RBC: 2.39 MIL/uL — ABNORMAL LOW (ref 3.87–5.11)
RDW: 22.1 % — ABNORMAL HIGH (ref 11.5–15.5)
WBC: 4.7 10*3/uL (ref 4.0–10.5)
nRBC: 0 % (ref 0.0–0.2)

## 2024-02-28 LAB — BASIC METABOLIC PANEL
Anion gap: 7 (ref 5–15)
BUN: 33 mg/dL — ABNORMAL HIGH (ref 8–23)
CO2: 29 mmol/L (ref 22–32)
Calcium: 7.9 mg/dL — ABNORMAL LOW (ref 8.9–10.3)
Chloride: 91 mmol/L — ABNORMAL LOW (ref 98–111)
Creatinine, Ser: 0.34 mg/dL — ABNORMAL LOW (ref 0.44–1.00)
GFR, Estimated: >60 mL/min (ref >60)
Glucose, Bld: 154 mg/dL — ABNORMAL HIGH (ref 70–99)
Potassium: 3.3 mmol/L — ABNORMAL LOW (ref 3.5–5.1)
Sodium: 127 mmol/L — ABNORMAL LOW (ref 135–145)

## 2024-02-28 LAB — MAGNESIUM: Magnesium: 1.9 mg/dL (ref 1.7–2.4)

## 2024-02-28 LAB — PREPARE RBC (CROSSMATCH)

## 2024-02-28 MED ORDER — SODIUM CHLORIDE 0.9% IV SOLUTION: Freq: Once | INTRAVENOUS | Status: DC

## 2024-02-28 MED ORDER — CARMEX CLASSIC LIP BALM EX OINT
TOPICAL_OINTMENT | CUTANEOUS | Status: DC | PRN
  Administered 2024-02-28 (×2): 1 via TOPICAL
  Filled 2024-02-28: qty 10

## 2024-02-28 MED ORDER — IOHEXOL 300 MG/ML  SOLN: 30.0000 mL | Freq: Once | INTRAMUSCULAR | Status: DC | PRN

## 2024-02-28 NOTE — Plan of Care (Signed)

## 2024-02-28 NOTE — Progress Notes (Signed)
 PROGRESS NOTE  Wendy HAINLINE ZOX:[id number] DOB: 06-12-1934 DOA: 02/23/2024 PCP: [name], MD   LOS: 5 days   Brief Narrative / Interim history: 89 year old female with pancreatic cancer status post surgery, palliative chemotherapy as well as radiation, history of paroxysmal A-fib not on anticoagulation, hyperlipidemia who comes into the hospital with generalized edema, weight gain.  Was found to have anasarca.  A 2D echo showed depressed EF with wall motion abnormalities, cardiology was consulted.  She was placed on diuretics  Subjective / 24h Interval events: In bed, feels very weak today and overall "not good".  Family is at bedside  Assesement and Plan: Principal Problem:   Generalized edema Active Problems:   Hyponatremia   Cancer of head of pancreas (HCC)   Hyperlipidemia   Normocytic anemia   Paroxysmal atrial fibrillation (HCC)   Failure to thrive in adult   Protein-calorie malnutrition, severe  Principal problem Acute on chronic systolic CHF-anasarca -patient admitted to the hospital with fluid overload.  She was placed on IV Lasix, cardiology was consulted.  A 2D echo showed LVEF 40-45% with wall motion abnormalities.  Cardiology consulted, appreciate input, she will be managed medically.  Started on beta-blockers, initially on metoprolol however she felt extremely fatigued and I changed her to carvedilol -Continue aspirin -Due to significant hypoalbuminemia, in the setting of anasarca she received albumin 2/27-28  Active problems Pancreatic cancer-diagnosed in July 2024, complicated by gastric outlet obstruction requiring duodenal stent and biliary stent.  Did go for Whipple, but this could not be completed due to involvement of the [facility], but did have partial surgery, cholecystectomy -Status post chemo and radiation therapy with some treatment response.  Oncology following  Normocytic anemia-due to underlying malignancy, continue iron supplementation.  She did have a  hemoglobin drop this morning down to 7.0.  No obvious bleeding but has been having worsening back pain, obtain CT scan to rule out an RP bleed -Obtain fecal occult, she also does have a duodenal stent  Hyponatremia-likely in the setting of fluid overload, sodium overall stable, continue Lasix, will receive albumin today  Severe protein calorie malnutrition, FTT-continue nutritional supplements, repeat albumin today  Purplish discoloration of fingertips-improved, normal flow per arterial duplex  PAF-not on anticoagulation, cardiology seeing patient as well.  Rate control for now, continue Coreg  Hyperlipidemia-continue statin  Normocytic anemia-due to cancer, continue iron supplementation  Bacteriuria, concern for UTI-urinalysis showed presence of bacteria, large leukocyte esterase, however she is denying any dysuria or any UTI type symptoms, however has become slightly more hypotensive 2/28.  Started empirically on ceftriaxone and cultures were sent.  Aim to treat for 3-5 days  Scheduled Meds:  sodium chloride   Intravenous Once   aspirin  81 mg Oral Daily   carvedilol  3.125 mg Oral BID WC   Chlorhexidine Gluconate Cloth  6 each Topical Daily   cyanocobalamin  1,000 mcg Oral Daily   docusate sodium  100 mg Oral BID   enoxaparin (LOVENOX) injection  40 mg Subcutaneous Q24H   famotidine  40 mg Oral Daily   feeding supplement  237 mL Oral TID BM   ferrous sulfate  325 mg Oral Q breakfast   furosemide  40 mg Oral Daily   multivitamin  1 tablet Oral Daily   simvastatin  20 mg Oral Q supper   sodium chloride flush  10-40 mL Intracatheter Q12H   Continuous Infusions:  cefTRIAXone (ROCEPHIN)  IV Stopped (02/27/24 1830)   PRN Meds:.acetaminophen **OR** acetaminophen, iohexol, lip balm,  ondansetron **OR** ondansetron (ZOFRAN) IV, oxyCODONE, polyethylene glycol, senna-docusate, sodium chloride flush, traMADol  Current Outpatient Medications  Medication Instructions   cyanocobalamin  (VITAMIN B12) 1,000 mcg, Oral, Every morning   docusate sodium (COLACE) 100 mg, Oral, 2 times daily   famotidine (PEPCID) 40 mg, Oral, Daily before breakfast   ferrous sulfate 325 mg, Oral, Daily with breakfast   lidocaine-prilocaine (EMLA) cream APPLY 1 APPLICATION TOPICALLY AS NEEDED **APPLY TO PORT 1-2 HOURS PRIOR TO APPOINTMENT   methocarbamol (ROBAXIN) 500 mg, Oral, 3 times daily PRN   ondansetron (ZOFRAN) 8 mg, Oral, Every 8 hours PRN   oxyCODONE (OXY IR/ROXICODONE) 5-10 mg, Oral, Every 6 hours PRN   polyethylene glycol (MIRALAX / GLYCOLAX) 17 g, Oral, Daily PRN   prochlorperazine (COMPAZINE) 5-10 mg, Oral, Every 6 hours PRN   simvastatin (ZOCOR) 20 mg, Oral, Daily with supper, Take 1/2 pill.   sodium chloride 1 g, Oral, 2 times daily with meals   traMADol (ULTRAM) 50 mg, Oral, Every 6 hours PRN, Do not take with oxycodone    Diet Orders (From admission, onward)     Start     Ordered   02/24/24 1452  Diet Heart Room service appropriate? Yes; Fluid consistency: Thin  Diet effective now       Question Answer Comment  Room service appropriate? Yes   Fluid consistency: Thin      02/24/24 1451            DVT prophylaxis: Place TED hose Start: 02/23/24 2341 enoxaparin (LOVENOX) injection 40 mg Start: 02/23/24 2200   Lab Results  Component Value Date   PLT 170 02/28/2024      Code Status: Limited: Do not attempt resuscitation (DNR) -DNR-LIMITED -Do Not Intubate/DNI   Family Communication: Daughter at bedside  Status is: Inpatient Remains inpatient appropriate because: Severity of illness   Level of care: Telemetry  Consultants:  Oncology Cardiology  Objective: Vitals:   02/28/24 0521 02/28/24 0534 02/28/24 0633 02/28/24 0830  BP:  (!) 119/47  (!) 119/57  Pulse:  (!) 116  (!) 112  Resp: 19 (!) 22 19   Temp:  98.7 F (37.1 C)  98 F (36.7 C)  TempSrc:  Oral  Oral  SpO2:  97%  97%  Weight:  57 kg    Height:        Intake/Output Summary (Last 24 hours)  at 02/28/2024 1137 Last data filed at 02/28/2024 0500 Gross per 24 hour  Intake 758.86 ml  Output 400 ml  Net 358.86 ml   Wt Readings from Last 3 Encounters:  02/28/24 57 kg  02/23/24 56.7 kg  02/19/24 52.6 kg    Examination:  Constitutional: NAD Eyes: lids and conjunctivae normal, no scleral icterus ENMT: mmm Neck: normal, supple Respiratory: clear to auscultation bilaterally, no wheezing, no crackles. Normal respiratory effort.  Cardiovascular: Regular rate and rhythm, no murmurs / rubs / gallops. Trace LE edema. Abdomen: soft, no distention, no tenderness. Bowel sounds positive.    Data Reviewed: I have independently reviewed following labs and imaging studies   CBC Recent Labs  Lab 02/23/24 1345 02/24/24 0309 02/25/24 0318 02/26/24 0440 02/27/24 0211 02/28/24 0355  WBC 7.7 5.9 5.7 6.6 4.8 4.7  HGB 11.8* 10.4* 10.1* 10.9* 8.6* 7.0*  HCT 35.6* 31.4* 31.1* 33.0* 26.1* 22.2*  PLT 195 167 184 197 156 170  MCV 85.8 86.7 87.9 88.5 89.1 92.9  MCH 28.4 28.7 28.5 29.2 29.4 29.3  MCHC 33.1 33.1 32.5 33.0 33.0 31.5  RDW 21.9* 21.9* 22.5* 22.6* 22.4* 22.1*  LYMPHSABS 0.4*  --   --   --   --   --   MONOABS 0.4  --   --   --   --   --   EOSABS 0.0  --   --   --   --   --   BASOSABS 0.0  --   --   --   --   --     Recent Labs  Lab 02/23/24 1345 02/24/24 0309 02/25/24 0318 02/26/24 0440 02/27/24 0211 02/28/24 0355  NA 126* 128* 131* 127* 129* 127*  K 4.1 3.2* 4.0 4.4 3.3* 3.3*  CL 94* 96* 95* 91* 92* 91*  CO2 26 25 28 28 31 29   GLUCOSE 165* 145* 147* 113* 135* 154*  BUN 25* 28* 29* 34* 29* 33*  CREATININE 0.36* 0.40* 0.37* 0.52 0.45 0.34*  CALCIUM 8.3* 7.9* 8.2* 8.1* 7.9* 7.9*  AST 52* 45* 42* 43* 34  --   ALT 33 34 31 32 23  --   ALKPHOS 217* 189* 180* 202* 142*  --   BILITOT 0.5 0.4 0.5 0.7 0.6  --   ALBUMIN 2.5* 1.7* 1.7* 1.7* 2.0*  --   MG 1.8 1.9  --  1.7 1.6* 1.9  BNP 614.7*  --   --   --   --   --      ------------------------------------------------------------------------------------------------------------------ No results for input(s): "CHOL", "HDL", "LDLCALC", "TRIG", "CHOLHDL", "LDLDIRECT" in the last 72 hours.  No results found for: "HGBA1C" ------------------------------------------------------------------------------------------------------------------ No results for input(s): "TSH", "T4TOTAL", "T3FREE", "THYROIDAB" in the last 72 hours.  Invalid input(s): "FREET3"  Cardiac Enzymes No results for input(s): "CKMB", "TROPONINI", "MYOGLOBIN" in the last 168 hours.  Invalid input(s): "CK" ------------------------------------------------------------------------------------------------------------------    Component Value Date/Time   BNP 614.7 (H) 02/23/2024 1345    CBG: No results for input(s): "GLUCAP" in the last 168 hours.  No results found for this or any previous visit (from the past 240 hours).   Radiology Studies: VAS [place] LOWER EXTREMITY VENOUS (DVT) Result Date: 02/27/2024  Lower Venous DVT Study Patient Name:  Wendy [name]  Date of Exam:   02/27/2024 Medical Rec #: [id number]         Accession #:    [id number] Date of Birth: 1934-01-11         Patient Gender: F Patient Age:   48 years Exam Location:  [facility] Procedure:      VAS [place] LOWER EXTREMITY VENOUS (DVT) Referring Phys: Pamella Pert --------------------------------------------------------------------------------  Indications: Swelling, and Edema.  Comparison Study: No prior exam. Performing Technologist: Fernande Bras  Examination Guidelines: A complete evaluation includes B-mode imaging, spectral Doppler, color Doppler, and power Doppler as needed of all accessible portions of each vessel. Bilateral testing is considered an integral part of a complete examination. Limited examinations for reoccurring indications may be performed as noted. The reflux portion of the exam is performed with the  patient in reverse Trendelenburg.  +---------+---------------+---------+-----------+----------+--------------+ RIGHT    CompressibilityPhasicitySpontaneityPropertiesThrombus Aging +---------+---------------+---------+-----------+----------+--------------+ CFV      Full           Yes      Yes                                 +---------+---------------+---------+-----------+----------+--------------+ SFJ      Full           Yes  Yes                                 +---------+---------------+---------+-----------+----------+--------------+ FV Prox  Full                                                        +---------+---------------+---------+-----------+----------+--------------+ FV Mid   Full                                                        +---------+---------------+---------+-----------+----------+--------------+ FV DistalFull                                                        +---------+---------------+---------+-----------+----------+--------------+ PFV      Full                                                        +---------+---------------+---------+-----------+----------+--------------+ POP      Full           Yes      Yes                                 +---------+---------------+---------+-----------+----------+--------------+ PTV      Full                                                        +---------+---------------+---------+-----------+----------+--------------+ PERO     Full                                                        +---------+---------------+---------+-----------+----------+--------------+   +---------+---------------+---------+-----------+----------+--------------+ LEFT     CompressibilityPhasicitySpontaneityPropertiesThrombus Aging +---------+---------------+---------+-----------+----------+--------------+ CFV      Full           Yes      Yes                                  +---------+---------------+---------+-----------+----------+--------------+ SFJ      Full           Yes      Yes                                 +---------+---------------+---------+-----------+----------+--------------+ FV Prox  Full                                                        +---------+---------------+---------+-----------+----------+--------------+  FV Mid   Full                                                        +---------+---------------+---------+-----------+----------+--------------+ FV DistalFull                                                        +---------+---------------+---------+-----------+----------+--------------+ PFV      Full                                                        +---------+---------------+---------+-----------+----------+--------------+ POP      Full           Yes      Yes                                 +---------+---------------+---------+-----------+----------+--------------+ PTV      Full                                                        +---------+---------------+---------+-----------+----------+--------------+ PERO     Full                                                        +---------+---------------+---------+-----------+----------+--------------+     Summary: BILATERAL: - No evidence of deep vein thrombosis seen in the lower extremities, bilaterally. -No evidence of popliteal cyst, bilaterally.   *See table(s) above for measurements and observations. Electronically signed by Coral Else MD on 02/27/2024 at 8:43:17 PM.    Final       Pamella Pert, MD, PhD [facility]  Between 7 am - 7 pm I am available, please contact me via Amion (for emergencies) or Securechat (non urgent messages)  Between 7 pm - 7 am I am not available, please contact night coverage MD/APP via Amion

## 2024-02-28 NOTE — Plan of Care (Signed)
  Problem: Education: Goal: Knowledge of General Education information will improve Description: Including pain rating scale, medication(s)/side effects and non-pharmacologic comfort measures Outcome: Progressing   Problem: Health Behavior/Discharge Planning: Goal: Ability to manage health-related needs will improve Outcome: Progressing   Problem: Clinical Measurements: Goal: Ability to maintain clinical measurements within normal limits will improve Outcome: Progressing Goal: Will remain free from infection Outcome: Progressing   Problem: Nutrition: Goal: Adequate nutrition will be maintained Outcome: Progressing   Problem: Coping: Goal: Level of anxiety will decrease Outcome: Progressing   Problem: Elimination: Goal: Will not experience complications related to bowel motility Outcome: Progressing

## 2024-02-29 DIAGNOSIS — R601 Generalized edema: Secondary | ICD-10-CM | POA: Diagnosis not present

## 2024-02-29 LAB — RETICULOCYTES
Immature Retic Fract: 24.2 % — ABNORMAL HIGH (ref 2.3–15.9)
RBC.: 2.78 MIL/uL — ABNORMAL LOW (ref 3.87–5.11)
Retic Count, Absolute: 82 10*3/uL (ref 19.0–186.0)
Retic Ct Pct: 3 % (ref 0.4–3.1)

## 2024-02-29 LAB — BASIC METABOLIC PANEL
Anion gap: 6 (ref 5–15)
BUN: 35 mg/dL — ABNORMAL HIGH (ref 8–23)
CO2: 29 mmol/L (ref 22–32)
Calcium: 8.1 mg/dL — ABNORMAL LOW (ref 8.9–10.3)
Chloride: 97 mmol/L — ABNORMAL LOW (ref 98–111)
Creatinine, Ser: 0.42 mg/dL — ABNORMAL LOW (ref 0.44–1.00)
GFR, Estimated: >60 mL/min (ref >60)
Glucose, Bld: 144 mg/dL — ABNORMAL HIGH (ref 70–99)
Potassium: 3.9 mmol/L (ref 3.5–5.1)
Sodium: 132 mmol/L — ABNORMAL LOW (ref 135–145)

## 2024-02-29 LAB — CBC
HCT: 26.2 % — ABNORMAL LOW (ref 36.0–46.0)
Hemoglobin: 8.2 g/dL — ABNORMAL LOW (ref 12.0–15.0)
MCH: 29.4 pg (ref 26.0–34.0)
MCHC: 31.3 g/dL (ref 30.0–36.0)
MCV: 93.9 fL (ref 80.0–100.0)
Platelets: 205 10*3/uL (ref 150–400)
RBC: 2.79 MIL/uL — ABNORMAL LOW (ref 3.87–5.11)
RDW: 21 % — ABNORMAL HIGH (ref 11.5–15.5)
WBC: 4.4 10*3/uL (ref 4.0–10.5)
nRBC: 0 % (ref 0.0–0.2)

## 2024-02-29 LAB — URINE CULTURE: Culture: >=100000 — AB

## 2024-02-29 LAB — FERRITIN: Ferritin: 97 ng/mL (ref 11–307)

## 2024-02-29 MED ORDER — CEFADROXIL 500 MG PO CAPS
500.0000 mg | ORAL_CAPSULE | Freq: Two times a day (BID) | ORAL | Status: DC
  Administered 2024-02-29: 500 mg via ORAL
  Filled 2024-02-29 (×2): qty 1

## 2024-02-29 NOTE — Progress Notes (Signed)
 IP PROGRESS NOTE  Subjective:   Ms. Zobrist reports minimal pain.  No bleeding.  Her husband is at the bedside.  The bedside RN reports she had a brown stool yesterday. She was transfused 1 unit of packed red blood cells yesterday. Objective: Vital signs in last 24 hours: Blood pressure (!) 128/57, pulse (!) 109, temperature 98.1 F (36.7 C), temperature source Oral, resp. rate (!) 21, height 5\' 6"  (1.676 m), weight 117 lb 14.4 oz (53.5 kg), SpO2 98%.  Intake/Output from previous day: 03/01 0701 - 03/02 0700 In: 525 [P.O.:200; Blood:325] Out: 1500 [Urine:1500]  Physical Exam:  HEENT: No thrush Lungs: Clear bilaterally Cardiac: Irregular  Abdomen: Soft, no hepatosplenomegaly, no mass Extremities: Trace pitting edema at the hands and lower legs   Portacath/PICC-without erythema  Lab Results: Recent Labs    02/28/24 0355 02/29/24 0644  WBC 4.7 4.4  HGB 7.0* 8.2*  HCT 22.2* 26.2*  PLT 170 205    BMET Recent Labs    02/28/24 0355 02/29/24 0644  NA 127* 132*  K 3.3* 3.9  CL 91* 97*  CO2 29 29  GLUCOSE 154* 144*  BUN 33* 35*  CREATININE 0.34* 0.42*  CALCIUM 7.9* 8.1*    Lab Results  Component Value Date   CAN199 206 (H) 01/06/2024    Studies/Results: CT ABDOMEN WO CONTRAST Result Date: 02/28/2024 CLINICAL DATA:  Provided history: Retroperitoneal hematoma, known or suspected (Ped 0-17y) Electronic records indicates history of pancreatic cancer status post Whipple procedure. EXAM: CT ABDOMEN WITHOUT CONTRAST TECHNIQUE: Multidetector CT imaging of the abdomen was performed following the standard protocol without IV contrast. RADIATION DOSE REDUCTION: This exam was performed according to the departmental dose-optimization program which includes automated exposure control, adjustment of the mA and/or kV according to patient size and/or use of iterative reconstruction technique. COMPARISON:  Most recent contrast-enhanced CT 02/16/2024 FINDINGS: Lower chest: Small  bilateral pleural effusions, new from prior exam. Effusions measures simple fluid density. Associated compressive atelectasis. The heart is enlarged. 4 mm right lower lobe pulmonary nodule series 6, image 13. This was not definitively seen on recent prior. Hepatobiliary: Small amount of ill-defined low-density in the right lobe of the liver, series 2, image 30, grossly stable allowing for lack of contrast. Again seen pneumobilia. Motion artifact limitations. Cholecystectomy. No definite acute findings. Pancreas: Pancreatic atrophy with air in the pancreatic duct. This is similar to prior exam. No evidence of peripancreatic inflammation. Motion and lack of contrast precludes more detailed assessment, evaluation is grossly stable from recent prior. Spleen: Normal in size without focal abnormality. Adrenals/Urinary Tract: No adrenal nodule. There is prominence of the right renal collecting system that is new from prior exam. No renal or ureteral stones in the included ureters. Only the abdomen is included in the field of view. No definite perinephric stranding. Stomach/Bowel: Bowel assessment is limited in the absence of contrast and patient motion artifact, as well as paucity of intra-abdominal fat. There is no abnormal gastric distension. Unchanged positioning of duodenal stent which contains intraluminal material. Enteric sutures are noted in the central abdomen. No small bowel dilatation. Moderate colonic stool burden. Portions of normal appendix are visualized. Vascular/Lymphatic: Aortic and branch atherosclerosis. No aortic aneurysm. There is no retroperitoneal stranding to suggest retroperitoneal hemorrhage. Limited assessment for adenopathy in the absence of contrast. Other: No definite abdominal free fluid. There is generalized body wall edema that is increased from prior exam, confluent in the flanks. Musculoskeletal: The bones are subjectively under mineralized. There are no acute or  suspicious osseous  abnormalities. IMPRESSION: 1. No evidence of retroperitoneal hemorrhage in the abdomen. Please note the pelvis was not ordered or evaluated. 2. Increase in generalized body wall edema. Small bilateral pleural effusions, new. 3. Mild prominence of the right renal collecting system and proximal ureter, new from prior. No cause for obstruction or urolithiasis in the abdomen. 4. Otherwise unchanged exam. Ill-defined low-density in the right lobe of the liver is similar to recent prior allowing for lack of IV contrast, limiting assessment. Stable positioning of duodenal stent. Aortic Atherosclerosis (ICD10-I70.0). Electronically Signed   By: Narda Rutherford M.D.   On: 02/28/2024 13:23   VAS [place] LOWER EXTREMITY VENOUS (DVT) Result Date: 02/27/2024  Lower Venous DVT Study Patient Name:  AUBREE DOODY  Date of Exam:   02/27/2024 Medical Rec #: [id number]         Accession #:    [id number] Date of Birth: 08/21/1934         Patient Gender: F Patient Age:   89 years Exam Location:  [facility] Procedure:      VAS [place] LOWER EXTREMITY VENOUS (DVT) Referring Phys: Pamella Pert --------------------------------------------------------------------------------  Indications: Swelling, and Edema.  Comparison Study: No prior exam. Performing Technologist: Fernande Bras  Examination Guidelines: A complete evaluation includes B-mode imaging, spectral Doppler, color Doppler, and power Doppler as needed of all accessible portions of each vessel. Bilateral testing is considered an integral part of a complete examination. Limited examinations for reoccurring indications may be performed as noted. The reflux portion of the exam is performed with the patient in reverse Trendelenburg.  +---------+---------------+---------+-----------+----------+--------------+ RIGHT    CompressibilityPhasicitySpontaneityPropertiesThrombus Aging +---------+---------------+---------+-----------+----------+--------------+ CFV      Full            Yes      Yes                                 +---------+---------------+---------+-----------+----------+--------------+ SFJ      Full           Yes      Yes                                 +---------+---------------+---------+-----------+----------+--------------+ FV Prox  Full                                                        +---------+---------------+---------+-----------+----------+--------------+ FV Mid   Full                                                        +---------+---------------+---------+-----------+----------+--------------+ FV DistalFull                                                        +---------+---------------+---------+-----------+----------+--------------+ PFV      Full                                                        +---------+---------------+---------+-----------+----------+--------------+  POP      Full           Yes      Yes                                 +---------+---------------+---------+-----------+----------+--------------+ PTV      Full                                                        +---------+---------------+---------+-----------+----------+--------------+ PERO     Full                                                        +---------+---------------+---------+-----------+----------+--------------+   +---------+---------------+---------+-----------+----------+--------------+ LEFT     CompressibilityPhasicitySpontaneityPropertiesThrombus Aging +---------+---------------+---------+-----------+----------+--------------+ CFV      Full           Yes      Yes                                 +---------+---------------+---------+-----------+----------+--------------+ SFJ      Full           Yes      Yes                                 +---------+---------------+---------+-----------+----------+--------------+ FV Prox  Full                                                         +---------+---------------+---------+-----------+----------+--------------+ FV Mid   Full                                                        +---------+---------------+---------+-----------+----------+--------------+ FV DistalFull                                                        +---------+---------------+---------+-----------+----------+--------------+ PFV      Full                                                        +---------+---------------+---------+-----------+----------+--------------+ POP      Full           Yes      Yes                                 +---------+---------------+---------+-----------+----------+--------------+  PTV      Full                                                        +---------+---------------+---------+-----------+----------+--------------+ PERO     Full                                                        +---------+---------------+---------+-----------+----------+--------------+     Summary: BILATERAL: - No evidence of deep vein thrombosis seen in the lower extremities, bilaterally. -No evidence of popliteal cyst, bilaterally.   *See table(s) above for measurements and observations. Electronically signed by Coral Else MD on 02/27/2024 at 8:43:17 PM.    Final     Medications: I have reviewed the patient's current medications.  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  Fluid-filled and distended stomach and proximal duodenum, single 11 mm periportal lymph node EGD 07/08/2023-large food residue in the stomach, extrinsic deformity at the first portion of the duodenum-biopsied Elevated CA 19-9 CT chest  07/11/2023-scattered small pulmonary nodules-indeterminate Cycle 1 gemcitabine/Abraxane 08/05/2023 Cycle 2 gemcitabine/Abraxane 08/19/2023 Cycle 3 gemcitabine/Abraxane 09/02/2023 Cycle 4 gemcitabine/Abraxane 09/16/2023 Cycle 5 gemcitabine/Abraxane 09/30/2023 CTs 10/09/2023-increase size of pancreas mass, increased pancreatic duct caliber and new biliary dilation, bilateral lower lobe nodules are stable, hypodense left thyroid nodule Cycle 6 gemcitabine/Abraxane 10/13/2023 10/28/2023 chemotherapy held due to LFT abnormalities Biliary drain placed 11/04/2023; internal/external biliary drain 11/06/2023 Exploratory laparotomy, cholecystectomy, choledochojejunostomy 11/17/2023-bulky tumor of the pancreas head with involvement of the transverse mesocolon and probable involvement of the distal SMV-plan for pancreaticoduodenectomy aborted Case presented at GI tumor conference 12/03/2023-recommendation to proceed with capecitabine/radiation Referred to Dr. Mitzi Hansen 12/09/2023 Radiation/capecitabine 12/29/2023-02/05/2024 CTs 02/16/2024: Pancreas head mass is smaller, unchanged duodenal stent, no evidence of lymphadenopathy or metastatic disease   2.   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   Anemia Progressive anemia February 2025, 1 unit packed red blood cells 02/28/2024 4.   Iron deficiency 5.   B12 deficiency 6.   Atrial fibrillation-maintained on Xarelto prior to hospital admission 7.  Family history of pancreas and uterine cancer 8.  Hyperlipidemia 9.  Genetics-negative 10.  Admission on 02/23/2024 with failure to thrive and volume overload 02/24/2024 echocardiogram: LVEF 40-45%, mildly dilated left and right atria, mild decrease in right ventricular systolic function, moderate aortic valve regurgitation Treated with diuresis 11.  E. coli urinary tract infection 02/26/2024   Ms. Degraffenreid was admitted 02/23/2024 with failure to thrive and evidence of  volume overload.  She has undergone diuresis with improvement in edema.  Her performance status is partially improved.  The underlying pancreas cancer is likely contributing to the malnutrition and low activity level.    The hemoglobin has been lower over the past few days.  No reported bleeding.  The anemia secondary to chronic disease, malnutrition, chemotherapy/radiation, phlebotomy, a UTI, and potentially subclinical GI bleeding.  The hemoglobin is higher after the  Red cell transfusion yesterday.  She has been diagnosed with an E. coli urinary tract infection.  She is completing a course of antibiotics.  I discussed the prognosis and disposition plans with Ms. Callas and her husband.  She has an incurable malignancy.  I recommend hospice care if her clinical status is not significantly improved over the next few days.  She would like to discuss hospice care with her family members prior to making a decision on hospice.   Recommendations: Physical therapy, out of bed as tolerated Follow hemoglobin as an outpatient 3.   Continue discussions with patient/family regarding hospice care, I recommend home hospice at discharge 4.   Follow-up ID/sensitivity on the E. coli, continue antibiotics   LOS: 6 days   [name], MD   02/29/2024, 8:31 AM

## 2024-02-29 NOTE — Plan of Care (Signed)

## 2024-02-29 NOTE — Progress Notes (Signed)
 PROGRESS NOTE  Wendy [name] UJW:[id number] DOB: 12-Oct-1934 DOA: 02/23/2024 PCP: [name], MD   LOS: 6 days   Brief Narrative / Interim history: 89 year old female with pancreatic cancer status post surgery, palliative chemotherapy as well as radiation, history of paroxysmal A-fib not on anticoagulation, hyperlipidemia who comes into the hospital with generalized edema, weight gain.  Was found to have anasarca.  A 2D echo showed depressed EF with wall motion abnormalities, cardiology was consulted.  She was placed on diuretics  Subjective / 24h Interval events: Feeling a little bit better, sitting in chair.  Husband is at bedside  Assesement and Plan: Principal Problem:   Generalized edema Active Problems:   Hyponatremia   Cancer of head of pancreas (HCC)   Hyperlipidemia   Normocytic anemia   Paroxysmal atrial fibrillation (HCC)   Failure to thrive in adult   Protein-calorie malnutrition, severe  Principal problem Acute on chronic systolic CHF-anasarca -patient admitted to the hospital with fluid overload.  She was placed on IV Lasix, cardiology was consulted.  A 2D echo showed LVEF 40-45% with wall motion abnormalities.  Cardiology consulted, appreciate input, she will be managed medically.  Started on beta-blockers, initially on metoprolol however she felt extremely fatigued and I changed her to carvedilol -Continue aspirin -Due to significant hypoalbuminemia, in the setting of anasarca she received albumin 2/27-28, no need for further albumin  Active problems Pancreatic cancer-diagnosed in July 2024, complicated by gastric outlet obstruction requiring duodenal stent and biliary stent.  Did go for Whipple, but this could not be completed due to involvement of the SMV, but did have partial surgery, cholecystectomy -Status post chemo and radiation therapy with some treatment response.  Following, discussed with Dr. Truett Perna this morning  Normocytic anemia-due to underlying  malignancy, continue iron supplementation.  She did have a hemoglobin drop on 3/1 down to 7.0.  No obvious bleeding but has been having worsening back pain, CT scan ruled out an RP bleed -No bleeding, received unit of packed red blood cells, hemoglobin improved appropriately today  Hyponatremia-likely in the setting of fluid overload, sodium stable and improving, continue Lasix  Severe protein calorie malnutrition, FTT-continue nutritional supplements,  Purplish discoloration of fingertips-improved, normal flow per arterial duplex  PAF-not on anticoagulation, cardiology seeing patient as well.  Rate control for now, continue Coreg  Hyperlipidemia-continue statin  Normocytic anemia-due to cancer, continue iron supplementation  Bacteriuria, concern for UTI-urinalysis showed presence of bacteria, large leukocyte esterase, however she is denying any dysuria or any UTI type symptoms, however has become slightly more hypotensive 2/28.  Was started on ceftriaxone, urine cultures are showing E. coli which is resistant to ampicillin, Bactrim and Augmentin but otherwise sensitive.  Transition to cefadroxil  Scheduled Meds:  sodium chloride   Intravenous Once   aspirin  81 mg Oral Daily   carvedilol  3.125 mg Oral BID WC   Chlorhexidine Gluconate Cloth  6 each Topical Daily   cyanocobalamin  1,000 mcg Oral Daily   docusate sodium  100 mg Oral BID   enoxaparin (LOVENOX) injection  40 mg Subcutaneous Q24H   famotidine  40 mg Oral Daily   feeding supplement  237 mL Oral TID BM   ferrous sulfate  325 mg Oral Q breakfast   furosemide  40 mg Oral Daily   multivitamin  1 tablet Oral Daily   simvastatin  20 mg Oral Q supper   sodium chloride flush  10-40 mL Intracatheter Q12H   Continuous Infusions:  cefTRIAXone (ROCEPHIN)  IV Stopped (02/28/24 1736)   PRN Meds:.acetaminophen **OR** acetaminophen, iohexol, lip balm, ondansetron **OR** ondansetron (ZOFRAN) IV, oxyCODONE, polyethylene glycol,  senna-docusate, sodium chloride flush, traMADol  Current Outpatient Medications  Medication Instructions   cyanocobalamin (VITAMIN B12) 1,000 mcg, Oral, Every morning   docusate sodium (COLACE) 100 mg, Oral, 2 times daily   famotidine (PEPCID) 40 mg, Oral, Daily before breakfast   ferrous sulfate 325 mg, Oral, Daily with breakfast   lidocaine-prilocaine (EMLA) cream APPLY 1 APPLICATION TOPICALLY AS NEEDED **APPLY TO PORT 1-2 HOURS PRIOR TO APPOINTMENT   methocarbamol (ROBAXIN) 500 mg, Oral, 3 times daily PRN   ondansetron (ZOFRAN) 8 mg, Oral, Every 8 hours PRN   oxyCODONE (OXY IR/ROXICODONE) 5-10 mg, Oral, Every 6 hours PRN   polyethylene glycol (MIRALAX / GLYCOLAX) 17 g, Oral, Daily PRN   prochlorperazine (COMPAZINE) 5-10 mg, Oral, Every 6 hours PRN   simvastatin (ZOCOR) 20 mg, Oral, Daily with supper, Take 1/2 pill.   sodium chloride 1 g, Oral, 2 times daily with meals   traMADol (ULTRAM) 50 mg, Oral, Every 6 hours PRN, Do not take with oxycodone    Diet Orders (From admission, onward)     Start     Ordered   02/24/24 1452  Diet Heart Room service appropriate? Yes; Fluid consistency: Thin  Diet effective now       Question Answer Comment  Room service appropriate? Yes   Fluid consistency: Thin      02/24/24 1451            DVT prophylaxis: Place TED hose Start: 02/23/24 2341 enoxaparin (LOVENOX) injection 40 mg Start: 02/23/24 2200   Lab Results  Component Value Date   PLT 205 02/29/2024      Code Status: Limited: Do not attempt resuscitation (DNR) -DNR-LIMITED -Do Not Intubate/DNI   Family Communication: Daughter at bedside  Status is: Inpatient Remains inpatient appropriate because: Severity of illness   Level of care: Telemetry  Consultants:  Oncology Cardiology  Objective: Vitals:   02/29/24 0547 02/29/24 0635 02/29/24 0847 02/29/24 1048  BP: (!) 128/57  (!) 128/57 (!) 102/47  Pulse: (!) 109  (!) 109 99  Resp:    20  Temp: 98.1 F (36.7 C)   97.7  F (36.5 C)  TempSrc: Oral   Oral  SpO2: 98%   98%  Weight:  53.5 kg    Height:        Intake/Output Summary (Last 24 hours) at 02/29/2024 1354 Last data filed at 02/29/2024 0548 Gross per 24 hour  Intake 525 ml  Output 1500 ml  Net -975 ml   Wt Readings from Last 3 Encounters:  02/29/24 53.5 kg  02/23/24 56.7 kg  02/19/24 52.6 kg    Examination:  Constitutional: NAD Eyes: lids and conjunctivae normal, no scleral icterus ENMT: mmm Neck: normal, supple Respiratory: clear to auscultation bilaterally, no wheezing, no crackles.  Cardiovascular: Regular rate and rhythm, no murmurs / rubs / gallops. No LE edema. Abdomen: soft, no distention, no tenderness. Bowel sounds positive.   Data Reviewed: I have independently reviewed following labs and imaging studies   CBC Recent Labs  Lab 02/23/24 1345 02/24/24 0309 02/25/24 0318 02/26/24 0440 02/27/24 0211 02/28/24 0355 02/29/24 0644  WBC 7.7   < > 5.7 6.6 4.8 4.7 4.4  HGB 11.8*   < > 10.1* 10.9* 8.6* 7.0* 8.2*  HCT 35.6*   < > 31.1* 33.0* 26.1* 22.2* 26.2*  PLT 195   < > 184 197 156  170 205  MCV 85.8   < > 87.9 88.5 89.1 92.9 93.9  MCH 28.4   < > 28.5 29.2 29.4 29.3 29.4  MCHC 33.1   < > 32.5 33.0 33.0 31.5 31.3  RDW 21.9*   < > 22.5* 22.6* 22.4* 22.1* 21.0*  LYMPHSABS 0.4*  --   --   --   --   --   --   MONOABS 0.4  --   --   --   --   --   --   EOSABS 0.0  --   --   --   --   --   --   BASOSABS 0.0  --   --   --   --   --   --    < > = values in this interval not displayed.    Recent Labs  Lab 02/23/24 1345 02/23/24 1345 02/24/24 0309 02/25/24 0318 02/26/24 0440 02/27/24 0211 02/28/24 0355 02/29/24 0644  NA 126*  --  128* 131* 127* 129* 127* 132*  K 4.1  --  3.2* 4.0 4.4 3.3* 3.3* 3.9  CL 94*  --  96* 95* 91* 92* 91* 97*  CO2 26  --  25 28 28 31 29 29   GLUCOSE 165*  --  145* 147* 113* 135* 154* 144*  BUN 25*  --  28* 29* 34* 29* 33* 35*  CREATININE 0.36*   < > 0.40* 0.37* 0.52 0.45 0.34* 0.42*  CALCIUM  8.3*  --  7.9* 8.2* 8.1* 7.9* 7.9* 8.1*  AST 52*  --  45* 42* 43* 34  --   --   ALT 33  --  34 31 32 23  --   --   ALKPHOS 217*  --  189* 180* 202* 142*  --   --   BILITOT 0.5  --  0.4 0.5 0.7 0.6  --   --   ALBUMIN 2.5*  --  1.7* 1.7* 1.7* 2.0*  --   --   MG 1.8  --  1.9  --  1.7 1.6* 1.9  --   BNP 614.7*  --   --   --   --   --   --   --    < > = values in this interval not displayed.    ------------------------------------------------------------------------------------------------------------------ No results for input(s): "CHOL", "HDL", "LDLCALC", "TRIG", "CHOLHDL", "LDLDIRECT" in the last 72 hours.  No results found for: "HGBA1C" ------------------------------------------------------------------------------------------------------------------ No results for input(s): "TSH", "T4TOTAL", "T3FREE", "THYROIDAB" in the last 72 hours.  Invalid input(s): "FREET3"  Cardiac Enzymes No results for input(s): "CKMB", "TROPONINI", "MYOGLOBIN" in the last 168 hours.  Invalid input(s): "CK" ------------------------------------------------------------------------------------------------------------------    Component Value Date/Time   BNP 614.7 (H) 02/23/2024 1345    CBG: No results for input(s): "GLUCAP" in the last 168 hours.  Recent Results (from the past 240 hours)  Urine Culture (for pregnant, neutropenic or urologic patients or patients with an indwelling urinary catheter)     Status: Abnormal   Collection Time: 02/27/24  6:08 PM   Specimen: Urine, Clean Catch  Result Value Ref Range Status   Specimen Description   Final    URINE, CLEAN CATCH Performed at [facility], [address], [place], [place] [address]    Special Requests   Final    NONE Performed at [facility], [address] [place], [place] [address]    Culture >=100,000 COLONIES/mL ESCHERICHIA COLI (A)  Final  Report Status 02/29/2024 FINAL  Final   Organism ID,  Bacteria ESCHERICHIA COLI (A)  Final      Susceptibility   Escherichia coli - MIC*    AMPICILLIN >=32 RESISTANT Resistant     CEFAZOLIN <=4 SENSITIVE Sensitive     CEFEPIME <=0.12 SENSITIVE Sensitive     CEFTRIAXONE <=0.25 SENSITIVE Sensitive     CIPROFLOXACIN <=0.25 SENSITIVE Sensitive     GENTAMICIN <=1 SENSITIVE Sensitive     IMIPENEM <=0.25 SENSITIVE Sensitive     NITROFURANTOIN <=16 SENSITIVE Sensitive     TRIMETH/SULFA >=320 RESISTANT Resistant     AMPICILLIN/SULBACTAM 16 INTERMEDIATE Intermediate     PIP/TAZO <=4 SENSITIVE Sensitive ug/mL    * >=100,000 COLONIES/mL ESCHERICHIA COLI     Radiology Studies: No results found.     Pamella Pert, MD, PhD [facility]  Between 7 am - 7 pm I am available, please contact me via Amion (for emergencies) or Securechat (non urgent messages)  Between 7 pm - 7 am I am not available, please contact night coverage MD/APP via Amion

## 2024-02-29 NOTE — Progress Notes (Signed)
 Physical Therapy Treatment Patient Details Name: Wendy [name] MRN: [id number] DOB: [birth year] Today's Date: 02/29/2024   History of Present Illness Wendy [name] is a 89 y.o. female directly admitted 02/23/24 by the cancer center for generalized weakness/fatigue and worsening generalized edema. Dx with acute CHF. PMH includes adenocarcinoma of the pancreas status post completion of radiation/capecitabine on 02/05/24, A-fib, GERD, HLD, HTN, vitamin D deficiency, anemia and chronic hyponatremia    PT Comments  Pt stuporous this session. Assisted family with standing pt and repositioning. Pt requiring +2 total assist of 2 to stand and with uncontrolled, non-purposeful movement LLE/scissoring in standing. Pt with limited response to painful stimuli, occasionally opens eyes with multi-modal stimuli. Family present and supportive. physical and cognitive deficits likely d/t medical decline. Pt and family making decisions regarding d/c plan, likely home with hospice.   If plan is discharge home, recommend the following: Two people to help with walking and/or transfers;Two people to help with bathing/dressing/bathroom;Help with stairs or ramp for entrance   Can travel by private vehicle        Equipment Recommendations  None recommended by PT    Recommendations for Other Services       Precautions / Restrictions Precautions Precautions: Fall Precaution/Restrictions Comments: family denies falls in past 6 months Restrictions Weight Bearing Restrictions Per Provider Order: No     Mobility  Bed Mobility                    Transfers   Equipment used: 2 person hand held assist Transfers: Sit to/from Stand Sit to Stand: Total assist, +2 physical assistance, +2 safety/equipment           General transfer comment: cues to incr level of arousal, flex knees; assist to rise and control descent; family wanted to allow pt to stand and reposition. feet left on floor/sitting upright  with son-in-law positioned in front of pt for safety;    Ambulation/Gait         Gait velocity: decr     General Gait Details: unable   Stairs             Wheelchair Mobility     Tilt Bed    Modified Rankin (Stroke Patients Only)       Balance Overall balance assessment: Needs assistance Sitting-balance support: No upper extremity supported, Feet supported Sitting balance-Leahy Scale: Poor     Standing balance support: Bilateral upper extremity supported Standing balance-Leahy Scale: Zero Standing balance comment: pt requiring +2 assist to static stand, pt lifting LLE off floor and then scissoring in standing, unable to self correct, unable to maintain placement of L foot when dtr corrected; difficulty following commands d/t lethargy                            Communication Communication Communication: Impaired Factors Affecting Communication: Difficulty expressing self  Cognition Arousal: stuporous       PT - Cognitive impairments: Difficult to assess Difficult to assess due to: Level of arousal                     PT - Cognition Comments: pt lethargic, son-in-law states pt has been mimicking questions asked and not responding when she has been alert Following commands: Impaired      Cueing Cueing Techniques: Verbal cues, Tactile cues  Exercises      General Comments        Pertinent Vitals/Pain  Pain Assessment Faces Pain Scale: No hurt Pain Intervention(s): Monitored during session    Home Living                          Prior Function            PT Goals (current goals can now be found in the care plan section) Acute Rehab PT Goals Patient Stated Goal: to get strong enough to go home PT Goal Formulation: With family Time For Goal Achievement: 03/11/24 Potential to Achieve Goals: Poor Progress towards PT goals: Progressing toward goals    Frequency    Min 1X/week      PT Plan       Co-evaluation              AM-PAC PT "6 Clicks" Mobility   Outcome Measure  Help needed turning from your back to your side while in a flat bed without using bedrails?: Total Help needed moving from lying on your back to sitting on the side of a flat bed without using bedrails?: Total Help needed moving to and from a bed to a chair (including a wheelchair)?: Total Help needed standing up from a chair using your arms (e.g., wheelchair or bedside chair)?: Total Help needed to walk in hospital room?: Total Help needed climbing 3-5 steps with a railing? : Total 6 Click Score: 6    End of Session Equipment Utilized During Treatment: Gait belt Activity Tolerance: Patient limited by lethargy;Other (comment) (likely medical decline) Patient left: in chair;with call bell/phone within reach;with chair alarm set;with family/visitor present Nurse Communication: Mobility status PT Visit Diagnosis: Difficulty in walking, not elsewhere classified (R26.2);Other abnormalities of gait and mobility (R26.89);Adult, failure to thrive (R62.7)     Time: 8413-2440 PT Time Calculation (min) (ACUTE ONLY): 15 min  Charges:    $Therapeutic Activity: 8-22 mins PT General Charges $$ ACUTE PT VISIT: 1 Visit                     Khameron Gruenwald, PT  Acute Rehab Dept [facility]) [phone]  02/29/2024    [facility] 02/29/2024, 4:12 PM

## 2024-03-01 ENCOUNTER — Inpatient Hospital Stay (HOSPITAL_COMMUNITY)

## 2024-03-01 ENCOUNTER — Encounter (HOSPITAL_COMMUNITY): Payer: Self-pay | Admitting: Student

## 2024-03-01 DIAGNOSIS — Z515 Encounter for palliative care: Secondary | ICD-10-CM

## 2024-03-01 DIAGNOSIS — R601 Generalized edema: Secondary | ICD-10-CM | POA: Diagnosis not present

## 2024-03-01 DIAGNOSIS — Z7189 Other specified counseling: Secondary | ICD-10-CM

## 2024-03-01 LAB — BPAM RBC
Blood Product Expiration Date: 202503252359
ISSUE DATE / TIME: 202503011210
Unit Type and Rh: 6200

## 2024-03-01 LAB — TYPE AND SCREEN
ABO/RH(D): A POS
Antibody Screen: NEGATIVE
Unit division: 0

## 2024-03-01 LAB — CBC
HCT: 24 % — ABNORMAL LOW (ref 36.0–46.0)
Hemoglobin: 7.5 g/dL — ABNORMAL LOW (ref 12.0–15.0)
MCH: 29.5 pg (ref 26.0–34.0)
MCHC: 31.3 g/dL (ref 30.0–36.0)
MCV: 94.5 fL (ref 80.0–100.0)
Platelets: 214 10*3/uL (ref 150–400)
RBC: 2.54 MIL/uL — ABNORMAL LOW (ref 3.87–5.11)
RDW: 21.4 % — ABNORMAL HIGH (ref 11.5–15.5)
WBC: 4 10*3/uL (ref 4.0–10.5)
nRBC: 0 % (ref 0.0–0.2)

## 2024-03-01 LAB — BASIC METABOLIC PANEL
Anion gap: 6 (ref 5–15)
BUN: 39 mg/dL — ABNORMAL HIGH (ref 8–23)
CO2: 30 mmol/L (ref 22–32)
Calcium: 8.2 mg/dL — ABNORMAL LOW (ref 8.9–10.3)
Chloride: 97 mmol/L — ABNORMAL LOW (ref 98–111)
Creatinine, Ser: 0.5 mg/dL (ref 0.44–1.00)
GFR, Estimated: >60 mL/min (ref >60)
Glucose, Bld: 133 mg/dL — ABNORMAL HIGH (ref 70–99)
Potassium: 3.6 mmol/L (ref 3.5–5.1)
Sodium: 133 mmol/L — ABNORMAL LOW (ref 135–145)

## 2024-03-01 LAB — MAGNESIUM: Magnesium: 1.8 mg/dL (ref 1.7–2.4)

## 2024-03-01 MED ORDER — CEFADROXIL 500 MG PO CAPS: 500.0000 mg | ORAL_CAPSULE | Freq: Two times a day (BID) | ORAL | 0 refills | Status: AC

## 2024-03-01 MED ORDER — HEPARIN SOD (PORK) LOCK FLUSH 100 UNIT/ML IV SOLN
500.0000 [IU] | INTRAVENOUS | Status: AC | PRN
  Administered 2024-03-01: 500 [IU]
  Filled 2024-03-01: qty 5

## 2024-03-01 MED ORDER — FUROSEMIDE 40 MG PO TABS: 40.0000 mg | ORAL_TABLET | Freq: Every day | ORAL | 0 refills | Status: DC

## 2024-03-01 MED ORDER — CARVEDILOL 3.125 MG PO TABS: 3.1250 mg | ORAL_TABLET | Freq: Two times a day (BID) | ORAL | 0 refills | Status: DC

## 2024-03-01 MED ORDER — ASPIRIN 81 MG PO CHEW: 81.0000 mg | CHEWABLE_TABLET | Freq: Every day | ORAL | 0 refills | Status: DC

## 2024-03-01 MED ORDER — ORAL CARE MOUTH RINSE
15.0000 mL | OROMUCOSAL | Status: DC
  Administered 2024-03-01: 15 mL via OROMUCOSAL

## 2024-03-01 MED ORDER — ORAL CARE MOUTH RINSE: 15.0000 mL | OROMUCOSAL | Status: DC | PRN

## 2024-03-01 NOTE — Consult Note (Signed)
 Palliative Medicine Inpatient Consult Note  Consulting Provider: Leatha Gilding, MD   Reason for consult:   Palliative Care Consult Services Palliative Medicine Consult  Reason for Consult? GOC   03/01/2024  HPI:  Per intake H&P --> 89 year old female with pancreatic cancer status post surgery, palliative chemotherapy as well as radiation, history of paroxysmal A-fib not on anticoagulation, hyperlipidemia who comes into the hospital with generalized edema, weight gain. Was found to have anasarca. Palliative care has been asked to discuss goals of care in the setting of terminal pancreatic cancer.   Clinical Assessment/Goals of Care:  *Please note that this is a verbal dictation therefore any spelling or grammatical errors are due to the "Dragon Medical One" system interpretation.  I have reviewed medical records including EPIC notes, labs and imaging, received report from bedside RN, assessed the patient who is lying in bed obtunded.    I met with patients daughter, Aggie Cosier and son in law to further discuss diagnosis prognosis, GOC, EOL wishes, disposition and options.   I introduced Palliative Medicine as specialized medical care for people living with serious illness. It focuses on providing relief from the symptoms and stress of a serious illness. The goal is to improve quality of life for both the patient and the family.  Medical History Review and Understanding:  Wendy [name] has a past medical history significant for pancreatic cancer diagnosed in July which she has received chemotherapy and radiation form. Afib no longer on anticoagulation, HLD, arthritis, HTN, and vitamin D deficiency.   Social History:  Wendy [name] is from [place] originally. She has been married to her spouse, Dorene [name] for the past 65 years. She has one daughter and two grandson. She is formerly a Presenter, broadcasting. She is a well accomplished woman. She loves her independence and being outside. She was very active  and formerly walking roughly six miles daily.   Functional and Nutritional State:  [name] has not been thriving for roughly three weeks or so physically or nutritionally.   Advance Directives:  A detailed discussion was had today regarding advanced directives.  Patient has these scanned into Vynca.   Code Status:  Concepts specific to code status, artifical feeding and hydration, continued IV antibiotics and rehospitalization was had.  The difference between a aggressive medical intervention path  and a palliative comfort care path for this patient at this time was had.   Jenalyn is an established DNAR/DNI.   Discussion:  Per discussion with patients family, Wendy [name] had gotten the diagnosis of pancreatic cancer back in July after she had received the gamet of antibiotics for what was though to be a urinary infection. Upon scans she was identified to have a pancreatic mass. Wendy [name] shared with her family that she was going to fight the disease as hard as possible. Her family notes that she has done that.  Three weeks ago, Wendy [name] received oral chemotherapy and radiation. She since then has endured a precipitous decline. Her family notes preceding hospitalization she was not eating or drinking she had also gotten terribly weak leading to her hospitalization.   Patients family do realize that she is at the end stage of the disease. We reviewed transitioning her home with hospice support. I described hospice as a service for patients who have a life expectancy of 6 months or less. The goal of hospice is the preservation of dignity and quality at the end phases of life. Under hospice care, the focus changes from curative to symptom relief. Utilized reflective listening  throughout our time together.   Plan for patient to transition home with [facility] [place], ideally today.   Decision Maker: Wendy [name] (Daughter): [phone] (Mobile)   SUMMARY OF RECOMMENDATIONS   DNAR/DNI  Wendy [name]  DNR on chart  Open and honest conversations held with patients family  Plan to transition [place] home with [facility] [place]  Ongoing PMT support  Code Status/Advance Care Planning: DNAR/DNI  Palliative Prophylaxis:  Aspiration, Bowel Regimen, Delirium Protocol, Frequent Pain Assessment, Oral Care, Palliative Wound Care, and Turn Reposition  Additional Recommendations (Limitations, Scope, Preferences): Continue current care  Psycho-social/Spiritual:  Desire for further Chaplaincy support: Patient has Pastoral support Additional Recommendations: End stage disease   Prognosis: Limited days to weeks  Discharge Planning: Discharge home with hospice.  Vitals:   03/01/24 0131 03/01/24 0612  BP: (!) 111/48 115/68  Pulse: (!) 110 (!) 108  Resp:  16  Temp: 98.5 F (36.9 C) 97.6 F (36.4 C)  SpO2: 98% 97%    Intake/Output Summary (Last 24 hours) at 03/01/2024 0757 Last data filed at 02/29/2024 2225 Gross per 24 hour  Intake 130 ml  Output --  Net 130 ml   Last Weight  Most recent update: 03/01/2024  4:22 AM    Weight  57.5 kg (126 lb 12.2 oz)            Gen:  Frail elderly Caucasian F chronically ill appearing HEENT: dry mucous membranes CV: Irregular rate and rhythm  PULM:  On RA, breathing is nonlabored ABD: soft/nontender  EXT: (+) muscle wasting Neuro: Somnolent  PPS: 10%   This conversation/these recommendations were discussed with patient primary care team, Dr. Elvera Lennox  Billing based on MDM: High ______________________________________________________ Lam[facility] [place] [place] Palliative Medicine Team Team Cell Phone: [phone] Please utilize secure chat with additional questions, if there is no response within 30 minutes please call the above phone number  Palliative Medicine Team providers are available by phone from 7am to 7pm daily and can be reached through the team cell phone.  Should this patient require assistance outside of these  hours, please call the patient's attending physician.

## 2024-03-01 NOTE — TOC Transition Note (Signed)
 Transition of Care [facility]) - Discharge Note  Patient Details  Name: SHONI QUIJAS MRN: [id number] Date of Birth: 10/28/1934  Transition [facility]) CM/SW Contact:  Ewing Schlein, LCSW Phone Number: 03/01/2024, 1:02 PM  Clinical Narrative: Patient will now discharge home with hospice services and family chose [facility] [place]. CSW notified Artavia with [facility] of the change in discharge plan. DME has been delivered to the home. CSW updated daughter, [name], regarding transportation as patient will now need PTAR home. Medical necessity form done; PTAR scheduled. Discharge packet completed. RN updated. TOC signing off.  Final next level of care: Home w Hospice Care Barriers to Discharge: Barriers Resolved  Patient Goals and CMS Choice Patient states their goals for this hospitalization and ongoing recovery are:: Discharge home with hospice CMS Medicare.gov Compare Post Acute Care list provided to:: Patient Represenative (must comment) Choice offered to / list presented to : Adult Children  Discharge Plan and Services Additional resources added to the After Visit Summary for   In-house Referral: Clinical Social Work Post Acute Care Choice: Home Health          DME Arranged: N/A DME Agency: Other - Comment ([facility] [place] set up DME) HH Arranged: PT, OT HH Agency: [facility] Date HH Agency Contacted: 02/26/24 Time HH Agency Contacted: 1427 Representative spoke with at [facility] Agency: Adele Dan  Social Drivers of Health (SDOH) Interventions SDOH Screenings   Food Insecurity: No Food Insecurity (02/24/2024)  Housing: Low Risk  (02/24/2024)  Transportation Needs: No Transportation Needs (02/24/2024)  Utilities: Not At Risk (02/24/2024)  Depression (PHQ2-9): Low Risk  (12/16/2023)  Financial Resource Strain: Low Risk  (08/05/2023)  Tobacco Use: Low Risk  (02/24/2024)  Health Literacy: Adequate Health Literacy (08/05/2023)   Readmission Risk  Interventions    02/24/2024    2:35 PM 11/21/2023   11:43 AM  Readmission Risk Prevention Plan  Transportation Screening Complete Complete  Home Care Screening  Complete  Medication Review (RN CM)  Complete  HRI or Home Care Consult Complete   Social Work Consult for Recovery Care Planning/Counseling Complete   Palliative Care Screening Not Applicable   Medication Review Oceanographer) Complete   HRI or Home Care Consult Complete   SW Recovery Care/Counseling Consult Complete   Palliative Care Screening Complete   Skilled Nursing Facility Not Applicable

## 2024-03-01 NOTE — Progress Notes (Signed)
 AVS placed in discharge packet along with DNR and printed prescriptions and handed to PTAR. Pt dressed in paper gown. Tele removed. PAC deaccessed by IV team. Family gathered all belongings and took with them. Pt taken via PTAR home.

## 2024-03-01 NOTE — Plan of Care (Signed)
  Problem: Education: Goal: Knowledge of General Education information will improve Description: Including pain rating scale, medication(s)/side effects and non-pharmacologic comfort measures Outcome: Progressing   Problem: Health Behavior/Discharge Planning: Goal: Ability to manage health-related needs will improve Outcome: Progressing   Problem: Clinical Measurements: Goal: Ability to maintain clinical measurements within normal limits will improve Outcome: Progressing Goal: Will remain free from infection Outcome: Progressing Goal: Diagnostic test results will improve Outcome: Progressing Goal: Respiratory complications will improve Outcome: Progressing Goal: Cardiovascular complication will be avoided Outcome: Progressing   Problem: Nutrition: Goal: Adequate nutrition will be maintained Outcome: Not Progressing   Problem: Activity: Goal: Risk for activity intolerance will decrease Outcome: Not Progressing

## 2024-03-01 NOTE — Discharge Summary (Signed)
 Physician Discharge Summary  Wendy [name] OZH:[id number] DOB: July 24, 1934 DOA: 02/23/2024  PCP: [name], MD  Admit date: 02/23/2024 Discharge date: 03/01/2024  Admitted From: home Disposition:  home with hospice  Recommendations for Outpatient Follow-up:  Follow up with hospice  Home Health: none Equipment/Devices: none  Discharge Condition: stable CODE STATUS: DNR Diet Orders (From admission, onward)     Start     Ordered   02/24/24 1452  Diet Heart Room service appropriate? Yes; Fluid consistency: Thin  Diet effective now       Question Answer Comment  Room service appropriate? Yes   Fluid consistency: Thin      02/24/24 1451            HPI: Per admitting MD, Wendy [name] is a 89 y.o. female with medical history significant for adenocarcinoma of the pancreas status post completion of radiation/capecitabine on 02/05/24, A-fib, GERD, HLD, HTN, vitamin D deficiency, anemia and chronic hyponatremia who was directly admitted by the cancer center for worsening generalized edema. Per daughter, patient had her last radiation therapy about 2 weeks ago and since then, she has had progressive edema mostly in her extremities. Her oncologist has been managing this in the outpatient however patient edema has worsened over the last week.  Reports patient was previously dehydrated but has had good p.o. intake recently but poor urine output.  Patient had constipation recently evaluated finally had a bowel movement prior to arrival to the hospital today.  Patient has had generalized weakness and fatigue but no fevers, chills, nausea, vomiting, dizziness, shortness of breath or chest pain.   Hospital Course / Discharge diagnoses: Principal Problem:   Generalized edema Active Problems:   Hyponatremia   Cancer of head of pancreas (HCC)   Hyperlipidemia   Normocytic anemia   Paroxysmal atrial fibrillation (HCC)   Failure to thrive in adult   Protein-calorie malnutrition,  severe   Principal problem Acute on chronic systolic CHF-anasarca -patient admitted to the hospital with fluid overload.  She was placed on IV Lasix, cardiology was consulted.  A 2D echo showed LVEF 40-45% with wall motion abnormalities.  Cardiology consulted, appreciate input, she will be managed medically.  Started on beta-blockers, initially on metoprolol however she felt extremely fatigued and she was changed to carvedilol.  She is tolerating this well.  Continue aspirin   Active problems Goals of care-patient's volume status has improved, hyponatremia has improved as well, however he does not seem to be overall improving and has been having progressive weakness with worsening p.o. intake and mobility.  Palliative care was consulted, and after discussion she will be discharged home with hospice. Pancreatic cancer-diagnosed in July 2024, complicated by gastric outlet obstruction requiring duodenal stent and biliary stent.  Did go for Whipple, but this could not be completed due to involvement of the SMV, but did have partial surgery, cholecystectomy. Status post chemo and radiation therapy with some treatment response.    Normocytic anemia-due to underlying malignancy, continue iron supplementation.  She did have a hemoglobin drop on 3/1 down to 7.0.  No obvious bleeding, was transfused unit of packed red blood cells  Hyponatremia-likely in the setting of fluid overload, sodium stable and improving, continue Lasix Severe protein calorie malnutrition, FTT-continue nutritional supplements, Purplish discoloration of fingertips-improved, normal flow per arterial duplex PAF-not on anticoagulation due to prior bleed Hyperlipidemia-continue statin Normocytic anemia-due to cancer, continue iron supplementation Bacteriuria, concern for UTI-urinalysis showed presence of bacteria, large leukocyte esterase, however she is denying  any dysuria or any UTI type symptoms, however has become slightly more  hypotensive 2/28.  Was started on ceftriaxone, urine cultures are showing E. coli which is resistant to ampicillin, Bactrim and Augmentin but otherwise sensitive.  Transitioned to cefadroxil  Sepsis ruled out   Discharge Instructions   Allergies as of 03/01/2024       Reactions   Bactrim [sulfamethoxazole-trimethoprim] Nausea And Vomiting, Other (See Comments)   "Knocked her for a loop"- dizziness and weakness, also   Hydromorphone Other (See Comments)   Was too strong at 2 mg   Protonix [pantoprazole] Rash        Medication List     TAKE these medications    carvedilol 3.125 MG tablet Commonly known as: COREG Take 1 tablet (3.125 mg total) by mouth 2 (two) times daily with a meal.   cefadroxil 500 MG capsule Commonly known as: DURICEF Take 1 capsule (500 mg total) by mouth 2 (two) times daily for 3 days.   cyanocobalamin 1000 MCG tablet Commonly known as: VITAMIN B12 Take 1,000 mcg by mouth in the morning.   docusate sodium 100 MG capsule Commonly known as: COLACE Take 1 capsule (100 mg total) by mouth 2 (two) times daily. What changed:  when to take this additional instructions   famotidine 40 MG tablet Commonly known as: PEPCID Take 40 mg by mouth daily before breakfast.   ferrous sulfate 325 (65 FE) MG EC tablet Take 1 tablet (325 mg total) by mouth daily with breakfast.   furosemide 40 MG tablet Commonly known as: LASIX Take 1 tablet (40 mg total) by mouth daily. Start taking on: March 02, 2024   lidocaine-prilocaine cream Commonly known as: EMLA APPLY 1 APPLICATION TOPICALLY AS NEEDED **APPLY TO PORT 1-2 HOURS PRIOR TO APPOINTMENT What changed: See the new instructions.   methocarbamol 500 MG tablet Commonly known as: ROBAXIN Take 1 tablet (500 mg total) by mouth 3 (three) times daily as needed for muscle spasms.   ondansetron 8 MG tablet Commonly known as: ZOFRAN Take 1 tablet (8 mg total) by mouth every 8 (eight) hours as needed for nausea or  vomiting.   oxyCODONE 5 MG immediate release tablet Commonly known as: Oxy IR/ROXICODONE Take 5-10 mg by mouth every 6 (six) hours as needed (for pain).   polyethylene glycol 17 g packet Commonly known as: MIRALAX / GLYCOLAX Take 17 g by mouth daily as needed for mild constipation.   prochlorperazine 5 MG tablet Commonly known as: COMPAZINE Take 1-2 tablets (5-10 mg total) by mouth every 6 (six) hours as needed for nausea or vomiting.   simvastatin 40 MG tablet Commonly known as: ZOCOR Take 0.5 tablets (20 mg total) by mouth daily with supper. Take 1/2 pill. What changed:  when to take this additional instructions   sodium chloride 1 g tablet Take 1 tablet (1 g total) by mouth 2 (two) times daily with a meal.   traMADol 50 MG tablet Commonly known as: ULTRAM Take 1 tablet (50 mg total) by mouth every 6 (six) hours as needed. Do not take with oxycodone What changed:  reasons to take this additional instructions        Follow-up Information     [facility] Follow up.   Why: [facility] will provide PT and OT in the home after discharge.               Consultations: Cardiology  Oncology Palliative care  Procedures/Studies:  CT HEAD WO CONTRAST (  ) Result Date: 03/01/2024 CLINICAL DATA:  Mental status change EXAM: CT HEAD WITHOUT CONTRAST TECHNIQUE: Contiguous axial images were obtained from the base of the skull through the vertex without intravenous contrast. RADIATION DOSE REDUCTION: This exam was performed according to the departmental dose-optimization program which includes automated exposure control, adjustment of the mA and/or kV according to patient size and/or use of iterative reconstruction technique. COMPARISON:  None Available. FINDINGS: Brain: No evidence of acute infarction, hemorrhage, hydrocephalus, extra-axial collection or mass lesion/mass effect.Generalized atrophy. Vascular: No hyperdense vessel or unexpected  calcification. Skull: Normal. Negative for fracture or focal lesion. Sinuses/Orbits: No acute finding. IMPRESSION: No acute finding. Electronically Signed   By: Tiburcio Pea M.D.   On: 03/01/2024 08:05   CT ABDOMEN WO CONTRAST Result Date: 02/28/2024 CLINICAL DATA:  Provided history: Retroperitoneal hematoma, known or suspected (Ped 0-17y) Electronic records indicates history of pancreatic cancer status post Whipple procedure. EXAM: CT ABDOMEN WITHOUT CONTRAST TECHNIQUE: Multidetector CT imaging of the abdomen was performed following the standard protocol without IV contrast. RADIATION DOSE REDUCTION: This exam was performed according to the departmental dose-optimization program which includes automated exposure control, adjustment of the mA and/or kV according to patient size and/or use of iterative reconstruction technique. COMPARISON:  Most recent contrast-enhanced CT 02/16/2024 FINDINGS: Lower chest: Small bilateral pleural effusions, new from prior exam. Effusions measures simple fluid density. Associated compressive atelectasis. The heart is enlarged. 4 mm right lower lobe pulmonary nodule series 6, image 13. This was not definitively seen on recent prior. Hepatobiliary: Small amount of ill-defined low-density in the right lobe of the liver, series 2, image 30, grossly stable allowing for lack of contrast. Again seen pneumobilia. Motion artifact limitations. Cholecystectomy. No definite acute findings. Pancreas: Pancreatic atrophy with air in the pancreatic duct. This is similar to prior exam. No evidence of peripancreatic inflammation. Motion and lack of contrast precludes more detailed assessment, evaluation is grossly stable from recent prior. Spleen: Normal in size without focal abnormality. Adrenals/Urinary Tract: No adrenal nodule. There is prominence of the right renal collecting system that is new from prior exam. No renal or ureteral stones in the included ureters. Only the abdomen is included  in the field of view. No definite perinephric stranding. Stomach/Bowel: Bowel assessment is limited in the absence of contrast and patient motion artifact, as well as paucity of intra-abdominal fat. There is no abnormal gastric distension. Unchanged positioning of duodenal stent which contains intraluminal material. Enteric sutures are noted in the central abdomen. No small bowel dilatation. Moderate colonic stool burden. Portions of normal appendix are visualized. Vascular/Lymphatic: Aortic and branch atherosclerosis. No aortic aneurysm. There is no retroperitoneal stranding to suggest retroperitoneal hemorrhage. Limited assessment for adenopathy in the absence of contrast. Other: No definite abdominal free fluid. There is generalized body wall edema that is increased from prior exam, confluent in the flanks. Musculoskeletal: The bones are subjectively under mineralized. There are no acute or suspicious osseous abnormalities. IMPRESSION: 1. No evidence of retroperitoneal hemorrhage in the abdomen. Please note the pelvis was not ordered or evaluated. 2. Increase in generalized body wall edema. Small bilateral pleural effusions, new. 3. Mild prominence of the right renal collecting system and proximal ureter, new from prior. No cause for obstruction or urolithiasis in the abdomen. 4. Otherwise unchanged exam. Ill-defined low-density in the right lobe of the liver is similar to recent prior allowing for lack of IV contrast, limiting assessment. Stable positioning of duodenal stent. Aortic Atherosclerosis (ICD10-I70.0). Electronically Signed   By: Shawna Orleans  Sanford M.D.   On: 02/28/2024 13:23   VAS [place] LOWER EXTREMITY VENOUS (DVT) Result Date: 02/27/2024  Lower Venous DVT Study Patient Name:  Wendy [name]  Date of Exam:   02/27/2024 Medical Rec #: [id number]         Accession #:    [id number] Date of Birth: 1934/09/29         Patient Gender: F Patient Age:   70 years Exam Location:  [facility] Procedure:       VAS [place] LOWER EXTREMITY VENOUS (DVT) Referring Phys: Pamella Pert --------------------------------------------------------------------------------  Indications: Swelling, and Edema.  Comparison Study: No prior exam. Performing Technologist: Fernande Bras  Examination Guidelines: A complete evaluation includes B-mode imaging, spectral Doppler, color Doppler, and power Doppler as needed of all accessible portions of each vessel. Bilateral testing is considered an integral part of a complete examination. Limited examinations for reoccurring indications may be performed as noted. The reflux portion of the exam is performed with the patient in reverse Trendelenburg.  +---------+---------------+---------+-----------+----------+--------------+ RIGHT    CompressibilityPhasicitySpontaneityPropertiesThrombus Aging +---------+---------------+---------+-----------+----------+--------------+ CFV      Full           Yes      Yes                                 +---------+---------------+---------+-----------+----------+--------------+ SFJ      Full           Yes      Yes                                 +---------+---------------+---------+-----------+----------+--------------+ FV Prox  Full                                                        +---------+---------------+---------+-----------+----------+--------------+ FV Mid   Full                                                        +---------+---------------+---------+-----------+----------+--------------+ FV DistalFull                                                        +---------+---------------+---------+-----------+----------+--------------+ PFV      Full                                                        +---------+---------------+---------+-----------+----------+--------------+ POP      Full           Yes      Yes                                  +---------+---------------+---------+-----------+----------+--------------+ PTV  Full                                                        +---------+---------------+---------+-----------+----------+--------------+ PERO     Full                                                        +---------+---------------+---------+-----------+----------+--------------+   +---------+---------------+---------+-----------+----------+--------------+ LEFT     CompressibilityPhasicitySpontaneityPropertiesThrombus Aging +---------+---------------+---------+-----------+----------+--------------+ CFV      Full           Yes      Yes                                 +---------+---------------+---------+-----------+----------+--------------+ SFJ      Full           Yes      Yes                                 +---------+---------------+---------+-----------+----------+--------------+ FV Prox  Full                                                        +---------+---------------+---------+-----------+----------+--------------+ FV Mid   Full                                                        +---------+---------------+---------+-----------+----------+--------------+ FV DistalFull                                                        +---------+---------------+---------+-----------+----------+--------------+ PFV      Full                                                        +---------+---------------+---------+-----------+----------+--------------+ POP      Full           Yes      Yes                                 +---------+---------------+---------+-----------+----------+--------------+ PTV      Full                                                        +---------+---------------+---------+-----------+----------+--------------+  PERO     Full                                                         +---------+---------------+---------+-----------+----------+--------------+     Summary: BILATERAL: - No evidence of deep vein thrombosis seen in the lower extremities, bilaterally. -No evidence of popliteal cyst, bilaterally.   *See table(s) above for measurements and observations. Electronically signed by Coral Else MD on 02/27/2024 at 8:43:17 PM.    Final    US Abdomen Limited RUQ (LIVER/GB) Result Date: 02/26/2024 CLINICAL DATA:  Right upper quadrant pain EXAM: ULTRASOUND ABDOMEN LIMITED RIGHT UPPER QUADRANT COMPARISON:  02/16/2024 FINDINGS: Gallbladder: Prior cholecystectomy Common bile duct: Diameter: Normal caliber, 2 mm Liver: Increased echotexture compatible with fatty infiltration. No focal abnormality or biliary ductal dilatation. Portal vein is patent on color Doppler imaging with normal direction of blood flow towards the liver. Other: None. IMPRESSION: Hepatic steatosis. Cholecystectomy. Electronically Signed   By: Charlett Nose M.D.   On: 02/26/2024 00:55   VAS [place] UPPER EXTREMITY ARTERIAL DUPLEX Result Date: 02/25/2024  UPPER EXTREMITY DUPLEX STUDY Patient Name:  Wendy [name]  Date of Exam:   02/24/2024 Medical Rec #: [id number]         Accession #:    [id number] Date of Birth: 03-28-1934         Patient Gender: F Patient Age:   13 years Exam Location:  [facility] Procedure:      VAS [place] UPPER EXTREMITY ARTERIAL DUPLEX Referring Phys: Joen Laura --------------------------------------------------------------------------------  Indications: Cyanosis.  Risk Factors: Hypertension, hyperlipidemia. Comparison Study: No prior studies. Performing Technologist: Chanda Busing RVT  Examination Guidelines: A complete evaluation includes B-mode imaging, spectral Doppler, color Doppler, and power Doppler as needed of all accessible portions of each vessel. Bilateral testing is considered an integral part of a complete examination. Limited examinations for reoccurring indications may be  performed as noted.  Right Doppler Findings: +---------------+----------+-----------+--------+--------+ Site           PSV (cm/s)Waveform   StenosisComments +---------------+----------+-----------+--------+--------+ Subclavian Mid 58        triphasic                   +---------------+----------+-----------+--------+--------+ Subclavian Dist          Multiphasic                 +---------------+----------+-----------+--------+--------+ Axillary       93        triphasic                   +---------------+----------+-----------+--------+--------+ Brachial Prox  109       Multiphasic                 +---------------+----------+-----------+--------+--------+ Brachial Dist  105       Multiphasic                 +---------------+----------+-----------+--------+--------+ Radial Prox    78        Multiphasic                 +---------------+----------+-----------+--------+--------+ Radial Mid     75        Multiphasic                 +---------------+----------+-----------+--------+--------+ Radial Dist    81  Multiphasic                 +---------------+----------+-----------+--------+--------+ Ulnar Prox     60        Multiphasic                 +---------------+----------+-----------+--------+--------+ Ulnar Mid      39        Multiphasic                 +---------------+----------+-----------+--------+--------+ Ulnar Dist     32        Multiphasic                 +---------------+----------+-----------+--------+--------+   Left Doppler Findings: +--------------+----------+-----------+--------+--------+ Site          PSV (cm/s)Waveform   StenosisComments +--------------+----------+-----------+--------+--------+ Subclavian Mid92        Multiphasic                 +--------------+----------+-----------+--------+--------+ Axillary      82        Multiphasic                  +--------------+----------+-----------+--------+--------+ Brachial Prox 93        Multiphasic                 +--------------+----------+-----------+--------+--------+ Brachial Dist 117       Multiphasic                 +--------------+----------+-----------+--------+--------+ Radial Prox   66        Multiphasic                 +--------------+----------+-----------+--------+--------+ Radial Mid    68        Multiphasic                 +--------------+----------+-----------+--------+--------+ Radial Dist   75        Multiphasic                 +--------------+----------+-----------+--------+--------+ Ulnar Prox    35        Multiphasic                 +--------------+----------+-----------+--------+--------+ Ulnar Mid     32        Multiphasic                 +--------------+----------+-----------+--------+--------+ Ulnar Dist    34        Multiphasic                 +--------------+----------+-----------+--------+--------+   Summary:  Right: No obstruction visualized in the right upper extremity. Left: No obstruction visualized in the left upper extremity. *See table(s) above for measurements and observations. Electronically signed by Coral Else MD on 02/25/2024 at 4:44:39 PM.    Final    ECHOCARDIOGRAM COMPLETE Result Date: 02/24/2024    ECHOCARDIOGRAM REPORT   Patient Name:   Wendy [name] Date of Exam: 02/24/2024 Medical Rec #:  [id number]        Height:       66.0 in Accession #:    [id number]       Weight:       125.0 lb Date of Birth:  Oct 25, 1934        BSA:          1.638 m Patient Age:    89 years         BP:           112/53  mmHg Patient Gender: F                HR:           100 bpm. Exam Location:  Inpatient Procedure: 2D Echo, Cardiac Doppler and Color Doppler (Both Spectral and Color            Flow Doppler were utilized during procedure). Indications:    Dyspnea R06.00  History:        Patient has prior history of Echocardiogram examinations, most                  recent 10/14/2019. Arrythmias:Atrial Fibrillation; Risk                 Factors:Hypertension and Dyslipidemia.  Sonographer:    Araceli Bouche Referring Phys: [id number] PROSPER M AMPONSAH IMPRESSIONS  1. Left ventricular ejection fraction, by estimation, is 40 to 45%. The left ventricle has mildly decreased function. The left ventricle demonstrates regional wall motion abnormalities (see scoring diagram/findings for description). Left ventricular diastolic parameters are consistent with Grade I diastolic dysfunction (impaired relaxation).  2. Right ventricular systolic function is mildly reduced. The right ventricular size is normal. There is normal pulmonary artery systolic pressure.  3. Left atrial size was mildly dilated.  4. Right atrial size was mildly dilated.  5. The mitral valve is normal in structure. Mild mitral valve regurgitation. No evidence of mitral stenosis.  6. The aortic valve is tricuspid. Aortic valve regurgitation is moderate. No aortic stenosis is present.  7. The inferior vena cava is normal in size with greater than 50% respiratory variability, suggesting right atrial pressure of 3 mmHg. FINDINGS  Left Ventricle: Left ventricular ejection fraction, by estimation, is 40 to 45%. The left ventricle has mildly decreased function. The left ventricle demonstrates regional wall motion abnormalities. The left ventricular internal cavity size was normal in size. There is no left ventricular hypertrophy. Left ventricular diastolic parameters are consistent with Grade I diastolic dysfunction (impaired relaxation).  LV Wall Scoring: Anterior and anterolateral wall motion abnormalities. Right Ventricle: The right ventricular size is normal. No increase in right ventricular wall thickness. Right ventricular systolic function is mildly reduced. There is normal pulmonary artery systolic pressure. The tricuspid regurgitant velocity is 2.42 m/s, and with an assumed right atrial pressure of 3 mmHg,  the estimated right ventricular systolic pressure is 26.4 mmHg. Left Atrium: Left atrial size was mildly dilated. Right Atrium: Right atrial size was mildly dilated. Pericardium: Trivial pericardial effusion is present. Mitral Valve: The mitral valve is normal in structure. Mild mitral valve regurgitation. No evidence of mitral valve stenosis. Tricuspid Valve: The tricuspid valve is normal in structure. Tricuspid valve regurgitation is mild . No evidence of tricuspid stenosis. Aortic Valve: The aortic valve is tricuspid. Aortic valve regurgitation is moderate. Aortic regurgitation PHT measures 334 msec. No aortic stenosis is present. Pulmonic Valve: The pulmonic valve was normal in structure. Pulmonic valve regurgitation is trivial. No evidence of pulmonic stenosis. Aorta: The aortic root is normal in size and structure. Venous: The inferior vena cava is normal in size with greater than 50% respiratory variability, suggesting right atrial pressure of 3 mmHg. IAS/Shunts: No atrial level shunt detected by color flow Doppler. Additional Comments: 3D imaging was not performed.  LEFT VENTRICLE PLAX 2D LVIDd:         3.30 cm LVIDs:         2.80 cm LV PW:         1.20 cm LV  IVS:        1.20 cm LVOT diam:     1.70 cm LV SV:         39 LV SV Index:   24 LVOT Area:     2.27 cm  RIGHT VENTRICLE            IVC RV S prime:     8.81 cm/s  IVC diam: 1.40 cm TAPSE (M-mode): 1.7 cm LEFT ATRIUM           Index        RIGHT ATRIUM           Index LA diam:      2.80 cm 1.71 cm/m   RA Area:     18.40 cm LA Vol (A2C): 35.9 ml 21.92 ml/m  RA Volume:   43.60 ml  26.62 ml/m LA Vol (A4C): 45.9 ml 28.03 ml/m  AORTIC VALVE             PULMONIC VALVE LVOT Vmax:   97.90 cm/s  PV Vmax:       0.74 m/s LVOT Vmean:  60.300 cm/s PV Peak grad:  2.2 mmHg LVOT VTI:    0.170 m AI PHT:      334 msec  AORTA Ao Root diam: 3.00 cm Ao Asc diam:  3.00 cm TRICUSPID VALVE TR Peak grad:   23.4 mmHg TR Vmax:        242.00 cm/s  SHUNTS Systemic VTI:  0.17 m  Systemic Diam: 1.70 cm Clearnce Hasten Electronically signed by Clearnce Hasten Signature Date/Time: 02/24/2024/10:04:42 AM    Final    DG Chest Port 1 View Result Date: 02/23/2024 CLINICAL DATA:  Dyspnea EXAM: PORTABLE CHEST 1 VIEW COMPARISON:  08/04/2023 FINDINGS: Single frontal view of the chest demonstrates stable right chest wall port. The cardiac silhouette is stable. No acute airspace disease, effusion, or pneumothorax. No acute bony abnormalities. IMPRESSION: 1. No acute intrathoracic process. Electronically Signed   By: Sharlet Salina M.D.   On: 02/23/2024 23:19   CT CHEST ABDOMEN PELVIS W CONTRAST Result Date: 02/16/2024 CLINICAL DATA:  Pancreatic cancer, low back pain * Tracking Code: BO * EXAM: CT CHEST, ABDOMEN, AND PELVIS WITH CONTRAST TECHNIQUE: Multidetector CT imaging of the chest, abdomen and pelvis was performed following the standard protocol during bolus administration of intravenous contrast. RADIATION DOSE REDUCTION: This exam was performed according to the departmental dose-optimization program which includes automated exposure control, adjustment of the mA and/or kV according to patient size and/or use of iterative reconstruction technique. CONTRAST:  80mL OMNIPAQUE IOHEXOL 300 MG/ML  SOLN COMPARISON:  CT abdomen pelvis, 11/13/2023 FINDINGS: CT CHEST FINDINGS Cardiovascular: No significant vascular findings. Normal heart size. No pericardial effusion. Mediastinum/Nodes: No enlarged mediastinal, hilar, or axillary lymph nodes. Thyroid gland, trachea, and esophagus demonstrate no significant findings. Lungs/Pleura: Lungs are clear. No pleural effusion or pneumothorax. Musculoskeletal: No chest wall abnormality. No acute osseous findings. CT ABDOMEN PELVIS FINDINGS Hepatobiliary: Interval removal of a previously seen percutaneous biliary drain with a small irregular residual hematoma or seroma in the right lobe of the liver (series 3, image 58). Status post cholecystectomy. Unchanged  postoperative pneumobilia. Pancreas: Patient's known pancreatic head mass is difficult to clearly visualize, but appears grossly diminished in size (series 3, image 69). Atrophy of the pancreas with dilatation of the pancreatic duct and intraductal air (series 3, image 66). Spleen: Normal in size without significant abnormality. Adrenals/Urinary Tract: Adrenal glands are unremarkable. Kidneys are normal, without renal calculi, solid lesion,  or hydronephrosis. Bladder is unremarkable. Stomach/Bowel: Normal stomach. Unchanged position of descending duodenal stent. Appendix appears normal. No evidence of bowel wall thickening, distention, or inflammatory changes. Evidence of small bowel resection and reanastomosis. Vascular/Lymphatic: Severe aortic atherosclerosis. No enlarged abdominal or pelvic lymph nodes. Reproductive: No mass or other abnormality. Other: No abdominal wall hernia.  Anasarca.  No ascites. Musculoskeletal: No acute osseous findings. IMPRESSION: 1. Patient's known pancreatic head mass is difficult to clearly visualize, but appears grossly diminished in size consistent with treatment response. Atrophy of the pancreas with dilatation of the pancreatic duct and intraductal air. 2. Unchanged position of descending duodenal stent. 3. Interval removal of previously seen percutaneous biliary drain, with a small irregular residual hematoma or seroma in the right lobe of the liver. 4. No evidence of lymphadenopathy or metastatic disease in the chest, abdomen, or pelvis. 5. Anasarca. Aortic Atherosclerosis (ICD10-I70.0). Electronically Signed   By: Jearld Lesch M.D.   On: 02/16/2024 13:25     Subjective: - no chest pain, shortness of breath, no abdominal pain, nausea or vomiting.   Discharge Exam: BP 138/66 (BP Location: Right Arm)   Pulse (!) 110   Temp 98.5 F (36.9 C) (Oral)   Resp (!) 21   Ht 5\' 6"  (1.676 m)   Wt 57.5 kg   SpO2 100%   BMI 20.46 kg/m   General: Pt is alert, awake, not in  acute distress Cardiovascular: RRR, S1/S2 +, no rubs, no gallops Respiratory: CTA bilaterally, no wheezing, no rhonchi Abdominal: Soft, NT, ND, bowel sounds + Extremities: no edema, no cyanosis    The results of significant diagnostics from this hospitalization (including imaging, microbiology, ancillary and laboratory) are listed below for reference.     Microbiology: Recent Results (from the past 240 hours)  Urine Culture (for pregnant, neutropenic or urologic patients or patients with an indwelling urinary catheter)     Status: Abnormal   Collection Time: 02/27/24  6:08 PM   Specimen: Urine, Clean Catch  Result Value Ref Range Status   Specimen Description   Final    URINE, CLEAN CATCH Performed at [facility], [address], [place], [place] [address]    Special Requests   Final    NONE Performed at [facility], [address] [place], [place] [address]    Culture >=100,000 COLONIES/mL ESCHERICHIA COLI (A)  Final   Report Status 02/29/2024 FINAL  Final   Organism ID, Bacteria ESCHERICHIA COLI (A)  Final      Susceptibility   Escherichia coli - MIC*    AMPICILLIN >=32 RESISTANT Resistant     CEFAZOLIN <=4 SENSITIVE Sensitive     CEFEPIME <=0.12 SENSITIVE Sensitive     CEFTRIAXONE <=0.25 SENSITIVE Sensitive     CIPROFLOXACIN <=0.25 SENSITIVE Sensitive     GENTAMICIN <=1 SENSITIVE Sensitive     IMIPENEM <=0.25 SENSITIVE Sensitive     NITROFURANTOIN <=16 SENSITIVE Sensitive     TRIMETH/SULFA >=320 RESISTANT Resistant     AMPICILLIN/SULBACTAM 16 INTERMEDIATE Intermediate     PIP/TAZO <=4 SENSITIVE Sensitive ug/mL    * >=100,000 COLONIES/mL ESCHERICHIA COLI     Labs: Basic Metabolic Panel: Recent Labs  Lab 02/24/24 0309 02/25/24 0318 02/26/24 0440 02/27/24 0211 02/28/24 0355 02/29/24 0644 03/01/24 0632  NA 128*   < > 127* 129* 127* 132* 133*  K 3.2*   < > 4.4 3.3* 3.3* 3.9 3.6  CL 96*   < > 91* 92* 91* 97* 97*  CO2 25    < >  28 31 29 29 30   GLUCOSE 145*   < > 113* 135* 154* 144* 133*  BUN 28*   < > 34* 29* 33* 35* 39*  CREATININE 0.40*   < > 0.52 0.45 0.34* 0.42* 0.50  CALCIUM 7.9*   < > 8.1* 7.9* 7.9* 8.1* 8.2*  MG 1.9  --  1.7 1.6* 1.9  --  1.8  PHOS 2.6  --  2.9  --   --   --   --    < > = values in this interval not displayed.   Liver Function Tests: Recent Labs  Lab 02/23/24 1345 02/24/24 0309 02/25/24 0318 02/26/24 0440 02/27/24 0211  AST 52* 45* 42* 43* 34  ALT 33 34 31 32 23  ALKPHOS 217* 189* 180* 202* 142*  BILITOT 0.5 0.4 0.5 0.7 0.6  PROT 4.7* 4.0* 4.2* 4.5* 4.0*  ALBUMIN 2.5* 1.7* 1.7* 1.7* 2.0*   CBC: Recent Labs  Lab 02/23/24 1345 02/24/24 0309 02/26/24 0440 02/27/24 0211 02/28/24 0355 02/29/24 0644 03/01/24 0632  WBC 7.7   < > 6.6 4.8 4.7 4.4 4.0  NEUTROABS 6.8  --   --   --   --   --   --   HGB 11.8*   < > 10.9* 8.6* 7.0* 8.2* 7.5*  HCT 35.6*   < > 33.0* 26.1* 22.2* 26.2* 24.0*  MCV 85.8   < > 88.5 89.1 92.9 93.9 94.5  PLT 195   < > 197 156 170 205 214   < > = values in this interval not displayed.   CBG: No results for input(s): "GLUCAP" in the last 168 hours. Hgb A1c No results for input(s): "HGBA1C" in the last 72 hours. Lipid Profile No results for input(s): "CHOL", "HDL", "LDLCALC", "TRIG", "CHOLHDL", "LDLDIRECT" in the last 72 hours. Thyroid function studies No results for input(s): "TSH", "T4TOTAL", "T3FREE", "THYROIDAB" in the last 72 hours.  Invalid input(s): "FREET3" Urinalysis    Component Value Date/Time   COLORURINE AMBER (A) 02/26/2024 0308   APPEARANCEUR CLOUDY (A) 02/26/2024 0308   LABSPEC 1.016 02/26/2024 0308   PHURINE 5.0 02/26/2024 0308   GLUCOSEU NEGATIVE 02/26/2024 0308   HGBUR NEGATIVE 02/26/2024 0308   BILIRUBINUR NEGATIVE 02/26/2024 0308   KETONESUR NEGATIVE 02/26/2024 0308   PROTEINUR NEGATIVE 02/26/2024 0308   NITRITE NEGATIVE 02/26/2024 0308   LEUKOCYTESUR LARGE (A) 02/26/2024 0308    FURTHER DISCHARGE INSTRUCTIONS:    Get Medicines reviewed and adjusted: Please take all your medications with you for your next visit with your Primary MD   Laboratory/radiological data: Please request your Primary MD to go over all hospital tests and procedure/radiological results at the follow up, please ask your Primary MD to get all Hospital records sent to his/her office.   In some cases, they will be blood work, cultures and biopsy results pending at the time of your discharge. Please request that your primary care M.D. goes through all the records of your hospital data and follows up on these results.   Also Note the following: If you experience worsening of your admission symptoms, develop shortness of breath, life threatening emergency, suicidal or homicidal thoughts you must seek medical attention immediately by calling 911 or calling your MD immediately  if symptoms less severe.   You must read complete instructions/literature along with all the possible adverse reactions/side effects for all the Medicines you take and that have been prescribed to you. Take any new Medicines after you have completely understood and accpet all  the possible adverse reactions/side effects.    Do not drive when taking Pain medications or sleeping medications (Benzodaizepines)   Do not take more than prescribed Pain, Sleep and Anxiety Medications. It is not advisable to combine anxiety,sleep and pain medications without talking with your primary care practitioner   Special Instructions: If you have smoked or chewed Tobacco  in the last 2 yrs please stop smoking, stop any regular Alcohol  and or any Recreational drug use.   Wear Seat belts while driving.   Please note: You were cared for by a hospitalist during your hospital stay. Once you are discharged, your primary care physician will handle any further medical issues. Please note that NO REFILLS for any discharge medications will be authorized once you are discharged, as it is  imperative that you return to your primary care physician (or establish a relationship with a primary care physician if you do not have one) for your post hospital discharge needs so that they can reassess your need for medications and monitor your lab values.  Time coordinating discharge: 40 minutes  SIGNED:  Pamella Pert, MD, PhD 03/01/2024, 12:30 PM

## 2024-03-01 NOTE — Progress Notes (Signed)
 IP PROGRESS NOTE  Subjective:   Ms. Bergevin was lethargic when I saw her at approximately 7:15 AM.  Her daughter was at the bedside.  Her daughter reports she did not eat much yesterday.  She last spoke clearly at bedtime last night. Objective: Vital signs in last 24 hours: Blood pressure 115/68, pulse (!) 108, temperature 97.6 F (36.4 C), temperature source Oral, resp. rate 18, height 5\' 6"  (1.676 m), weight 126 lb 12.2 oz (57.5 kg), SpO2 97%.  Intake/Output from previous day: 03/02 0701 - 03/03 0700 In: 130 [P.O.:120; I.V.:10] Out: -   Physical Exam:  HEENT: The pupils are equal Cardiac: Irregular  Abdomen: Soft, no hepatosplenomegaly, no mass Extremities: Trace pitting edema at the hands and lower legs Neurologic: Lethargic, opens eyes with sternal rub, otherwise not responding.  Not following commands.  Disconjugate gaze.   Portacath/PICC-without erythema  Lab Results: Recent Labs    02/29/24 0644 03/01/24 0632  WBC 4.4 4.0  HGB 8.2* 7.5*  HCT 26.2* 24.0*  PLT 205 214    BMET Recent Labs    02/29/24 0644 03/01/24 0632  NA 132* 133*  K 3.9 3.6  CL 97* 97*  CO2 29 30  GLUCOSE 144* 133*  BUN 35* 39*  CREATININE 0.42* 0.50  CALCIUM 8.1* 8.2*    Lab Results  Component Value Date   CAN199 206 (H) 01/06/2024    Studies/Results: CT HEAD WO CONTRAST ( ) Result Date: 03/01/2024 CLINICAL DATA:  Mental status change EXAM: CT HEAD WITHOUT CONTRAST TECHNIQUE: Contiguous axial images were obtained from the base of the skull through the vertex without intravenous contrast. RADIATION DOSE REDUCTION: This exam was performed according to the departmental dose-optimization program which includes automated exposure control, adjustment of the mA and/or kV according to patient size and/or use of iterative reconstruction technique. COMPARISON:  None Available. FINDINGS: Brain: No evidence of acute infarction, hemorrhage, hydrocephalus, extra-axial collection or mass  lesion/mass effect.Generalized atrophy. Vascular: No hyperdense vessel or unexpected calcification. Skull: Normal. Negative for fracture or focal lesion. Sinuses/Orbits: No acute finding. IMPRESSION: No acute finding. Electronically Signed   By: Tiburcio Pea M.D.   On: 03/01/2024 08:05    Medications: I have reviewed the patient's current medications.  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  Fluid-filled and distended stomach and proximal duodenum, single 11 mm periportal lymph node EGD 07/08/2023-large food residue in the stomach, extrinsic deformity at the first portion of the duodenum-biopsied Elevated CA 19-9 CT chest 07/11/2023-scattered small pulmonary nodules-indeterminate Cycle 1 gemcitabine/Abraxane 08/05/2023 Cycle 2 gemcitabine/Abraxane 08/19/2023 Cycle 3 gemcitabine/Abraxane 09/02/2023 Cycle 4 gemcitabine/Abraxane 09/16/2023 Cycle 5 gemcitabine/Abraxane 09/30/2023 CTs 10/09/2023-increase size of pancreas mass, increased pancreatic duct caliber and new biliary dilation, bilateral lower lobe nodules are stable, hypodense left thyroid nodule Cycle 6 gemcitabine/Abraxane 10/13/2023 10/28/2023 chemotherapy held due to LFT abnormalities Biliary drain placed 11/04/2023; internal/external biliary drain 11/06/2023 Exploratory laparotomy, cholecystectomy, choledochojejunostomy 11/17/2023-bulky tumor of the pancreas head with involvement of the transverse mesocolon and probable involvement of the distal SMV-plan for pancreaticoduodenectomy aborted Case presented at GI tumor conference 12/03/2023-recommendation to proceed with capecitabine/radiation Referred to Dr. Mitzi Hansen  12/09/2023 Radiation/capecitabine 12/29/2023-02/05/2024 CTs 02/16/2024: Pancreas head mass is smaller, unchanged duodenal stent, no evidence of lymphadenopathy or metastatic disease   2.   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   Anemia Progressive anemia February 2025, 1 unit packed red blood cells 02/28/2024 4.   Iron deficiency 5.   B12 deficiency 6.   Atrial fibrillation-maintained on Xarelto prior to hospital admission 7.  Family history of pancreas and uterine cancer 8.  Hyperlipidemia 9.  Genetics-negative 10.  Admission on 02/23/2024 with failure to thrive and volume overload 02/24/2024 echocardiogram: LVEF 40-45%, mildly dilated left and right atria, mild decrease in right ventricular systolic function, moderate aortic valve regurgitation Treated with diuresis 11.  E. coli urinary tract infection 02/26/2024   Ms. Oppenheimer was admitted 02/23/2024 with failure to thrive and evidence of volume overload.  She has undergone diuresis with improvement in edema.  Her performance was initially partially improved, but she has experienced a marked decline in her neurologic status today.   The etiology of the acute decline is not clear.  I suspect a CNS event such as a CVA.  I discussed the differential diagnosis with her daughter.  The urinary tract infection is most likely not responsible for the mental status change.  She received 10 mg of oxycodone yesterday morning.  I doubt this is responsible for the mental status change.  I discussed hospice care with Ms. Gadea and her husband yesterday.  Her daughter reports the family is in agreement with home hospice care.  I am concerned her lifespan may be limited to hours or days if the mental status is not improved.       Recommendations: Comfort measures Home hospice care Antibiotics for the urinary tract infection if tolerating oral medications Decision on CNS imaging after  discussion with Dr. Elvera Lennox Discontinue nonessential medications, consider changing to full comfort care depending on her status throughout the day    LOS: 7 days   [name], MD   03/01/2024, 12:10 PM

## 2024-03-08 ENCOUNTER — Ambulatory Visit
Admission: RE | Admit: 2024-03-08 | Discharge: 2024-03-08 | Disposition: A | Discharge status: Home / Self Care | Payer: Medicare Other | Source: Ambulatory Visit | Attending: Radiation Oncology | Admitting: Radiation Oncology

## 2024-03-08 NOTE — Progress Notes (Addendum)
  Radiation Oncology         [phone] ________________________________  Name: Wendy [name] MRN: [id number]  Date of Service: 03/08/2024  DOB: 1934/09/27  Post Treatment Telephone Note  Diagnosis:  Stage IIA, cT3N0M0, unresectable adenocarcinoma of the pancreatic head with invasion into the meosocolon (as documented in provider EOT note)  The patient was not available for call today.  Patient's daughter Ms. [name] called to report PATIENT HAS PASSED AWAY on 11-Mar-2024.   Ruel Favors, LPN

## 2024-03-30 DEATH — deceased
